# Patient Record
Sex: Female | Born: 1975 | Race: Black or African American | Hispanic: No | Smoking: Never smoker
Health system: Southern US, Community
[De-identification: ages and names within clinical notes are randomized; demographics above are authoritative.]

## PROBLEM LIST (undated history)

## (undated) DIAGNOSIS — D849 Immunodeficiency, unspecified: Secondary | ICD-10-CM

## (undated) DIAGNOSIS — K219 Gastro-esophageal reflux disease without esophagitis: Secondary | ICD-10-CM

## (undated) DIAGNOSIS — H3560 Retinal hemorrhage, unspecified eye: Secondary | ICD-10-CM

## (undated) DIAGNOSIS — R7303 Prediabetes: Secondary | ICD-10-CM

## (undated) DIAGNOSIS — Z8709 Personal history of other diseases of the respiratory system: Secondary | ICD-10-CM

## (undated) DIAGNOSIS — E559 Vitamin D deficiency, unspecified: Secondary | ICD-10-CM

## (undated) DIAGNOSIS — M797 Fibromyalgia: Secondary | ICD-10-CM

## (undated) DIAGNOSIS — G43909 Migraine, unspecified, not intractable, without status migrainosus: Secondary | ICD-10-CM

## (undated) DIAGNOSIS — N3289 Other specified disorders of bladder: Secondary | ICD-10-CM

## (undated) DIAGNOSIS — H409 Unspecified glaucoma: Secondary | ICD-10-CM

## (undated) DIAGNOSIS — D259 Leiomyoma of uterus, unspecified: Secondary | ICD-10-CM

## (undated) DIAGNOSIS — M1990 Unspecified osteoarthritis, unspecified site: Secondary | ICD-10-CM

## (undated) DIAGNOSIS — D509 Iron deficiency anemia, unspecified: Secondary | ICD-10-CM

## (undated) DIAGNOSIS — D649 Anemia, unspecified: Secondary | ICD-10-CM

## (undated) DIAGNOSIS — J4520 Mild intermittent asthma, uncomplicated: Secondary | ICD-10-CM

## (undated) DIAGNOSIS — M351 Other overlap syndromes: Secondary | ICD-10-CM

## (undated) DIAGNOSIS — J449 Chronic obstructive pulmonary disease, unspecified: Secondary | ICD-10-CM

## (undated) DIAGNOSIS — F419 Anxiety disorder, unspecified: Secondary | ICD-10-CM

## (undated) DIAGNOSIS — M359 Systemic involvement of connective tissue, unspecified: Secondary | ICD-10-CM

## (undated) DIAGNOSIS — Z973 Presence of spectacles and contact lenses: Secondary | ICD-10-CM

## (undated) DIAGNOSIS — R3915 Urgency of urination: Secondary | ICD-10-CM

## (undated) DIAGNOSIS — R87610 Atypical squamous cells of undetermined significance on cytologic smear of cervix (ASC-US): Secondary | ICD-10-CM

## (undated) DIAGNOSIS — Z7901 Long term (current) use of anticoagulants: Secondary | ICD-10-CM

## (undated) DIAGNOSIS — H4010X0 Unspecified open-angle glaucoma, stage unspecified: Secondary | ICD-10-CM

## (undated) DIAGNOSIS — Z86711 Personal history of pulmonary embolism: Secondary | ICD-10-CM

## (undated) DIAGNOSIS — G4730 Sleep apnea, unspecified: Secondary | ICD-10-CM

## (undated) DIAGNOSIS — B029 Zoster without complications: Secondary | ICD-10-CM

## (undated) DIAGNOSIS — Z862 Personal history of diseases of the blood and blood-forming organs and certain disorders involving the immune mechanism: Secondary | ICD-10-CM

## (undated) DIAGNOSIS — N920 Excessive and frequent menstruation with regular cycle: Secondary | ICD-10-CM

## (undated) DIAGNOSIS — Z6841 Body Mass Index (BMI) 40.0 and over, adult: Secondary | ICD-10-CM

## (undated) HISTORY — DX: Migraine, unspecified, not intractable, without status migrainosus: G43.909

## (undated) HISTORY — DX: Zoster without complications: B02.9

## (undated) HISTORY — DX: Body Mass Index (BMI) 40.0 and over, adult: Z684

## (undated) HISTORY — DX: Chronic obstructive pulmonary disease, unspecified: J44.9

## (undated) HISTORY — PX: TONSILLECTOMY: SUR1361

## (undated) HISTORY — DX: Anxiety disorder, unspecified: F41.9

## (undated) HISTORY — DX: Personal history of diseases of the blood and blood-forming organs and certain disorders involving the immune mechanism: Z86.2

## (undated) HISTORY — DX: Morbid (severe) obesity due to excess calories: E66.01

## (undated) HISTORY — PX: ANAL EXAMINATION UNDER ANESTHESIA: SHX1138

## (undated) HISTORY — DX: Unspecified osteoarthritis, unspecified site: M19.90

## (undated) HISTORY — PX: GYNECOLOGIC CRYOSURGERY: SHX857

---
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
## 1998-05-26 ENCOUNTER — Emergency Department (HOSPITAL_COMMUNITY): Admission: EM | Admit: 1998-05-26 | Discharge: 1998-05-26 | Payer: Self-pay | Admitting: Emergency Medicine

## 1998-05-26 ENCOUNTER — Encounter: Payer: Self-pay | Admitting: Emergency Medicine

## 1998-09-12 ENCOUNTER — Other Ambulatory Visit: Admission: RE | Admit: 1998-09-12 | Discharge: 1998-09-12 | Payer: Self-pay | Admitting: Obstetrics and Gynecology

## 1998-12-02 ENCOUNTER — Other Ambulatory Visit: Admission: RE | Admit: 1998-12-02 | Discharge: 1998-12-02 | Payer: Self-pay | Admitting: Obstetrics and Gynecology

## 1999-05-12 ENCOUNTER — Other Ambulatory Visit: Admission: RE | Admit: 1999-05-12 | Discharge: 1999-05-12 | Payer: Self-pay | Admitting: Obstetrics and Gynecology

## 1999-09-16 ENCOUNTER — Other Ambulatory Visit: Admission: RE | Admit: 1999-09-16 | Discharge: 1999-09-16 | Payer: Self-pay | Admitting: Obstetrics and Gynecology

## 2000-04-27 ENCOUNTER — Other Ambulatory Visit: Admission: RE | Admit: 2000-04-27 | Discharge: 2000-04-27 | Payer: Self-pay | Admitting: Obstetrics and Gynecology

## 2000-10-03 ENCOUNTER — Other Ambulatory Visit: Admission: RE | Admit: 2000-10-03 | Discharge: 2000-10-03 | Payer: Self-pay | Admitting: Obstetrics and Gynecology

## 2004-06-11 ENCOUNTER — Ambulatory Visit: Payer: Self-pay | Admitting: Internal Medicine

## 2004-09-21 ENCOUNTER — Ambulatory Visit: Payer: Self-pay | Admitting: Internal Medicine

## 2004-11-10 ENCOUNTER — Emergency Department (HOSPITAL_COMMUNITY): Admission: EM | Admit: 2004-11-10 | Discharge: 2004-11-10 | Payer: Self-pay | Admitting: Emergency Medicine

## 2005-02-04 ENCOUNTER — Ambulatory Visit: Payer: Self-pay | Admitting: Internal Medicine

## 2005-07-15 ENCOUNTER — Ambulatory Visit: Payer: Self-pay | Admitting: Internal Medicine

## 2005-08-05 ENCOUNTER — Ambulatory Visit: Payer: Self-pay | Admitting: Internal Medicine

## 2005-09-07 ENCOUNTER — Emergency Department (HOSPITAL_COMMUNITY): Admission: EM | Admit: 2005-09-07 | Discharge: 2005-09-07 | Payer: Self-pay | Admitting: Family Medicine

## 2006-03-23 ENCOUNTER — Encounter: Admission: RE | Admit: 2006-03-23 | Discharge: 2006-03-23 | Payer: Self-pay | Admitting: Internal Medicine

## 2010-03-04 ENCOUNTER — Encounter: Admission: RE | Admit: 2010-03-04 | Discharge: 2010-03-04 | Payer: Self-pay | Admitting: Internal Medicine

## 2013-04-23 ENCOUNTER — Other Ambulatory Visit: Payer: Self-pay

## 2013-04-23 DIAGNOSIS — Z1231 Encounter for screening mammogram for malignant neoplasm of breast: Secondary | ICD-10-CM

## 2013-04-27 ENCOUNTER — Ambulatory Visit (HOSPITAL_COMMUNITY)
Admission: RE | Admit: 2013-04-27 | Discharge: 2013-04-27 | Disposition: A | Discharge status: Home / Self Care | Payer: 59 | Source: Ambulatory Visit | Attending: Internal Medicine | Admitting: Internal Medicine

## 2013-04-27 DIAGNOSIS — Z1231 Encounter for screening mammogram for malignant neoplasm of breast: Secondary | ICD-10-CM | POA: Insufficient documentation

## 2016-11-02 ENCOUNTER — Inpatient Hospital Stay (HOSPITAL_COMMUNITY)
Admission: EM | Admit: 2016-11-02 | Discharge: 2016-11-04 | DRG: 176 | Disposition: A | Discharge status: Home / Self Care | Payer: 59 | Attending: Internal Medicine | Admitting: Internal Medicine

## 2016-11-02 ENCOUNTER — Emergency Department (HOSPITAL_COMMUNITY): Payer: 59

## 2016-11-02 ENCOUNTER — Encounter (HOSPITAL_COMMUNITY): Payer: Self-pay | Admitting: Emergency Medicine

## 2016-11-02 DIAGNOSIS — R079 Chest pain, unspecified: Secondary | ICD-10-CM | POA: Diagnosis not present

## 2016-11-02 DIAGNOSIS — Z6841 Body Mass Index (BMI) 40.0 and over, adult: Secondary | ICD-10-CM

## 2016-11-02 DIAGNOSIS — J45909 Unspecified asthma, uncomplicated: Secondary | ICD-10-CM | POA: Diagnosis present

## 2016-11-02 DIAGNOSIS — Z23 Encounter for immunization: Secondary | ICD-10-CM

## 2016-11-02 DIAGNOSIS — E66813 Obesity, class 3: Secondary | ICD-10-CM | POA: Diagnosis present

## 2016-11-02 DIAGNOSIS — I2699 Other pulmonary embolism without acute cor pulmonale: Principal | ICD-10-CM | POA: Diagnosis present

## 2016-11-02 LAB — CBC
HCT: 38.2 % (ref 36.0–46.0)
Hemoglobin: 12.2 g/dL (ref 12.0–15.0)
MCH: 26.9 pg (ref 26.0–34.0)
MCHC: 31.9 g/dL (ref 30.0–36.0)
MCV: 84.3 fL (ref 78.0–100.0)
PLATELETS: 278 10*3/uL (ref 150–400)
RBC: 4.53 MIL/uL (ref 3.87–5.11)
RDW: 14 % (ref 11.5–15.5)
WBC: 8.4 10*3/uL (ref 4.0–10.5)

## 2016-11-02 LAB — I-STAT TROPONIN, ED: TROPONIN I, POC: 0 ng/mL (ref 0.00–0.08)

## 2016-11-02 LAB — BASIC METABOLIC PANEL
Anion gap: 9 (ref 5–15)
BUN: 5 mg/dL — AB (ref 6–20)
CHLORIDE: 103 mmol/L (ref 101–111)
CO2: 24 mmol/L (ref 22–32)
CREATININE: 0.68 mg/dL (ref 0.44–1.00)
Calcium: 9.4 mg/dL (ref 8.9–10.3)
GFR calc non Af Amer: >60 mL/min (ref >60)
Glucose, Bld: 108 mg/dL — ABNORMAL HIGH (ref 65–99)
Potassium: 3.6 mmol/L (ref 3.5–5.1)
SODIUM: 136 mmol/L (ref 135–145)

## 2016-11-02 LAB — POC URINE PREG, ED: Preg Test, Ur: NEGATIVE

## 2016-11-02 MED ORDER — HYDROMORPHONE HCL 1 MG/ML IJ SOLN
1.0000 mg | Freq: Once | INTRAMUSCULAR | Status: AC
  Administered 2016-11-02: 1 mg via INTRAVENOUS
  Filled 2016-11-02: qty 1

## 2016-11-02 MED ORDER — IOPAMIDOL (ISOVUE-370) INJECTION 76%
INTRAVENOUS | Status: AC
  Administered 2016-11-03: 100 mL
  Filled 2016-11-02: qty 100

## 2016-11-02 MED ORDER — ONDANSETRON HCL 4 MG/2ML IJ SOLN
4.0000 mg | Freq: Four times a day (QID) | INTRAMUSCULAR | Status: DC | PRN
  Administered 2016-11-02: 4 mg via INTRAVENOUS
  Filled 2016-11-02: qty 2

## 2016-11-02 NOTE — ED Triage Notes (Signed)
Pt reports cp for several days, states pain with movement. Sent from UC with abnormal EKG showing PACs. Pt reports shortness of breath.

## 2016-11-03 ENCOUNTER — Ambulatory Visit (HOSPITAL_BASED_OUTPATIENT_CLINIC_OR_DEPARTMENT_OTHER): Payer: 59

## 2016-11-03 ENCOUNTER — Encounter (HOSPITAL_COMMUNITY): Payer: Self-pay | Admitting: Radiology

## 2016-11-03 ENCOUNTER — Emergency Department (HOSPITAL_COMMUNITY): Payer: 59

## 2016-11-03 DIAGNOSIS — I2699 Other pulmonary embolism without acute cor pulmonale: Secondary | ICD-10-CM | POA: Diagnosis not present

## 2016-11-03 DIAGNOSIS — J45909 Unspecified asthma, uncomplicated: Secondary | ICD-10-CM | POA: Diagnosis present

## 2016-11-03 DIAGNOSIS — Z86711 Personal history of pulmonary embolism: Secondary | ICD-10-CM | POA: Diagnosis not present

## 2016-11-03 DIAGNOSIS — Z6841 Body Mass Index (BMI) 40.0 and over, adult: Secondary | ICD-10-CM | POA: Diagnosis not present

## 2016-11-03 DIAGNOSIS — R079 Chest pain, unspecified: Secondary | ICD-10-CM | POA: Diagnosis present

## 2016-11-03 DIAGNOSIS — Z23 Encounter for immunization: Secondary | ICD-10-CM | POA: Diagnosis not present

## 2016-11-03 LAB — HEPARIN LEVEL (UNFRACTIONATED)
HEPARIN UNFRACTIONATED: 0.51 [IU]/mL (ref 0.30–0.70)
Heparin Unfractionated: 0.53 IU/mL (ref 0.30–0.70)

## 2016-11-03 LAB — HIV ANTIBODY (ROUTINE TESTING W REFLEX): HIV Screen 4th Generation wRfx: NONREACTIVE

## 2016-11-03 LAB — ANTITHROMBIN III: AntiThromb III Func: 103 % (ref 75–120)

## 2016-11-03 MED ORDER — POLYETHYLENE GLYCOL 3350 17 G PO PACK: 17.0000 g | PACK | Freq: Every day | ORAL | Status: DC | PRN

## 2016-11-03 MED ORDER — HEPARIN BOLUS VIA INFUSION
6000.0000 [IU] | Freq: Once | INTRAVENOUS | Status: AC
  Administered 2016-11-03: 6000 [IU] via INTRAVENOUS
  Filled 2016-11-03: qty 6000

## 2016-11-03 MED ORDER — OXYCODONE-ACETAMINOPHEN 5-325 MG PO TABS
1.0000 | ORAL_TABLET | Freq: Four times a day (QID) | ORAL | Status: DC | PRN
  Administered 2016-11-03 (×3): 1 via ORAL
  Administered 2016-11-03 – 2016-11-04 (×3): 2 via ORAL
  Filled 2016-11-03 (×3): qty 2
  Filled 2016-11-03 (×3): qty 1

## 2016-11-03 MED ORDER — HEPARIN (PORCINE) IN NACL 100-0.45 UNIT/ML-% IJ SOLN
1750.0000 [IU]/h | INTRAMUSCULAR | Status: AC
  Administered 2016-11-03: 1750 [IU]/h via INTRAVENOUS
  Filled 2016-11-03 (×2): qty 250

## 2016-11-03 MED ORDER — LORATADINE 10 MG PO TABS
10.0000 mg | ORAL_TABLET | Freq: Every day | ORAL | Status: DC
  Administered 2016-11-03 – 2016-11-04 (×2): 10 mg via ORAL
  Filled 2016-11-03 (×2): qty 1

## 2016-11-03 MED ORDER — ACETAMINOPHEN 650 MG RE SUPP
650.0000 mg | Freq: Four times a day (QID) | RECTAL | Status: DC | PRN
  Administered 2016-11-04: 650 mg via RECTAL

## 2016-11-03 MED ORDER — HYDROCODONE-ACETAMINOPHEN 5-325 MG PO TABS: 1.0000 | ORAL_TABLET | ORAL | Status: DC | PRN

## 2016-11-03 MED ORDER — APIXABAN 5 MG PO TABS
10.0000 mg | ORAL_TABLET | Freq: Two times a day (BID) | ORAL | Status: DC
  Administered 2016-11-04: 10 mg via ORAL
  Filled 2016-11-03 (×2): qty 2

## 2016-11-03 MED ORDER — OXYCODONE-ACETAMINOPHEN 5-325 MG PO TABS
2.0000 | ORAL_TABLET | Freq: Once | ORAL | Status: AC
  Administered 2016-11-03: 2 via ORAL
  Filled 2016-11-03: qty 2

## 2016-11-03 MED ORDER — SODIUM CHLORIDE 0.9% FLUSH: 3.0000 mL | Freq: Two times a day (BID) | INTRAVENOUS | Status: DC

## 2016-11-03 MED ORDER — PNEUMOCOCCAL VAC POLYVALENT 25 MCG/0.5ML IJ INJ
0.5000 mL | INJECTION | INTRAMUSCULAR | Status: AC
  Administered 2016-11-04: 0.5 mL via INTRAMUSCULAR
  Filled 2016-11-03: qty 0.5

## 2016-11-03 MED ORDER — ONDANSETRON HCL 4 MG/2ML IJ SOLN: 4.0000 mg | Freq: Four times a day (QID) | INTRAMUSCULAR | Status: DC | PRN

## 2016-11-03 MED ORDER — ACETAMINOPHEN 325 MG PO TABS
650.0000 mg | ORAL_TABLET | Freq: Four times a day (QID) | ORAL | Status: DC | PRN
  Administered 2016-11-03 (×2): 650 mg via ORAL
  Filled 2016-11-03 (×4): qty 2

## 2016-11-03 MED ORDER — APIXABAN 5 MG PO TABS: 5.0000 mg | ORAL_TABLET | Freq: Two times a day (BID) | ORAL | Status: DC

## 2016-11-03 MED ORDER — LORATADINE 10 MG PO TABS: 10.0000 mg | ORAL_TABLET | Freq: Every day | ORAL | Status: DC

## 2016-11-03 MED ORDER — ONDANSETRON HCL 4 MG PO TABS
4.0000 mg | ORAL_TABLET | Freq: Four times a day (QID) | ORAL | Status: DC | PRN
  Filled 2016-11-03: qty 1

## 2016-11-03 MED ORDER — FLUTICASONE PROPIONATE 50 MCG/ACT NA SUSP
2.0000 | Freq: Every day | NASAL | Status: DC
  Administered 2016-11-03 – 2016-11-04 (×2): 2 via NASAL
  Filled 2016-11-03: qty 16

## 2016-11-03 NOTE — Progress Notes (Signed)
RE: Benefit check Eliquis Received: Today  Message Contents  Mauricia Area A CMA        Eliquis 5mg  PO BID is covered.  Prior Josem Kaufmann is not needed.  There is a quantity limit of 2.5 tablets per day.  Copay for 90 day supply (mail order) is $190.  Copay for 30 day supply at retail pharmacy ([facility]) is $85.

## 2016-11-03 NOTE — ED Provider Notes (Signed)
[facility] DEPT Provider Note   CSN: [id number] Arrival date & time: 11/02/16  1948     History   Chief Complaint Chief Complaint  Patient presents with  . Chest Pain    HPI Holly Mcdonald is a 41 y.o. female.  HPI Patient ports increasing chest pain shortness breath over the past several days without fever or chills.  She was seen at urgent care and sent to the emergency department secondary to frequent PACs.  Denies significant palpitations.  Reports a history of asthma but states this feels different.  She reports severe pleuritic anterior left-sided chest pain.  Chest pain is also worse with palpation.  Symptoms are moderate to severe in severity.   Past Medical History:  Diagnosis Date  . Asthma     There are no active problems to display for this patient.   Past Surgical History:  Procedure Laterality Date  . RECTAL EXAMINATION UNDER ANESTHESIA W/ CYSTOSCOPY    . TONSILLECTOMY      OB History    No data available       Home Medications    Prior to Admission medications   Not on File    Family History No family history on file.  Social History Social History  Substance Use Topics  . Smoking status: Never Smoker  . Smokeless tobacco: Never Used  . Alcohol use Yes     Allergies   Patient has no active allergies.   Review of Systems Review of Systems  All other systems reviewed and are negative.    Physical Exam Updated Vital Signs BP 116/81   Pulse 94   Temp 99.5 F (37.5 C) (Oral)   Resp (!) 22   LMP 10/24/2016 (Within Days)   SpO2 100%   Physical Exam  Constitutional: She is oriented to person, place, and time. She appears well-developed and well-nourished. No distress.  HENT:  Head: Normocephalic and atraumatic.  Eyes: EOM are normal.  Neck: Normal range of motion.  Cardiovascular: Normal rate, regular rhythm and normal heart sounds.   Pulmonary/Chest: Effort normal and breath sounds normal.  Anterior chest  tenderness  Abdominal: Soft. She exhibits no distension. There is no tenderness.  Musculoskeletal: Normal range of motion. She exhibits no edema or tenderness.  Neurological: She is alert and oriented to person, place, and time.  Skin: Skin is warm and dry.  Psychiatric: She has a normal mood and affect. Judgment normal.  Nursing note and vitals reviewed.    ED Treatments / Results  Labs (all labs ordered are listed, but only abnormal results are displayed) Labs Reviewed  BASIC METABOLIC PANEL - Abnormal; Notable for the following:       Result Value   Glucose, Bld 108 (*)    BUN 5 (*)    All other components within normal limits  CBC  ANTITHROMBIN III  HIV ANTIBODY (ROUTINE TESTING)  PROTEIN C ACTIVITY  PROTEIN C, TOTAL  PROTEIN S ACTIVITY  PROTEIN S, TOTAL  LUPUS ANTICOAGULANT PANEL  BETA-2-GLYCOPROTEIN I ABS, IGG/M/A  HOMOCYSTEINE  FACTOR 5 LEIDEN  PROTHROMBIN GENE MUTATION  CARDIOLIPIN ANTIBODIES, IGG, IGM, IGA  HEPARIN LEVEL (UNFRACTIONATED)  I-STAT TROPOININ, ED  POC URINE PREG, ED    EKG  EKG Interpretation  Date/Time:  Tuesday November 02 2016 19:53:33 EDT Ventricular Rate:  94 PR Interval:  158 QRS Duration: 74 QT Interval:  326 QTC Calculation: 407 R Axis:   -47 Text Interpretation:  Normal sinus rhythm Left axis deviation Anterior infarct ,  age undetermined Abnormal ECG No significant change was found Confirmed by Jola Schmidt [phone]) on 11/02/2016 11:06:58 PM       Radiology Dg Chest 2 View  Result Date: 11/02/2016 CLINICAL DATA:  Left chest pain and shortness breath for 5 days. EXAM: CHEST  2 VIEW COMPARISON:  03/23/2006 chest radiograph FINDINGS: This is a low volume film. The cardiomediastinal silhouette is unremarkable. Left posterior lower lobe opacity may represent atelectasis or airspace disease. Mild peribronchial thickening again noted. There is no evidence of definite pleural effusion or pneumothorax. IMPRESSION: Left basilar opacity-question  atelectasis versus airspace disease/ pneumonia. Electronically Signed   By: Margarette Canada M.D.   On: 11/02/2016 21:04   Ct Angio Chest Pe W And/or Wo Contrast  Result Date: 11/03/2016 CLINICAL DATA:  41 y/o F; severe chest pain, question pneumonia, shortness of breath with exertion. EXAM: CT ANGIOGRAPHY CHEST WITH CONTRAST TECHNIQUE: Multidetector CT imaging of the chest was performed using the standard protocol during bolus administration of intravenous contrast. Multiplanar CT image reconstructions and MIPs were obtained to evaluate the vascular anatomy. CONTRAST:  80 cc Isovue 370 COMPARISON:  11/02/2016 chest radiograph. FINDINGS: Cardiovascular: Satisfactory opacification of the pulmonary arteries to the segmental level. Acute segmental pulmonary emboli are identified in the lower lobes bilaterally (series 7, image 131 and 137). RV/ LV ratio equals 0.8. Normal heart size.  No pericardial effusion. Mediastinum/Nodes: No enlarged mediastinal, hilar, or axillary lymph nodes. Thyroid gland, trachea, and esophagus demonstrate no significant findings. Lungs/Pleura: Small left pleural effusion and minor left lung base dependent atelectasis. No evidence for pneumonia. Upper Abdomen: No acute abnormality. Musculoskeletal: No chest wall abnormality. No acute or significant osseous findings. Review of the MIP images confirms the above findings. IMPRESSION: 1. Acute segmental pulmonary emboli in lower lobes bilaterally. No CT evidence of right heart strain. 2. Small left pleural effusion. 3. No pulmonary consolidation. These results were called by telephone at the time of interpretation on 11/03/2016 at 1:47 am to Dr. Jola Schmidt , who verbally acknowledged these results. Electronically Signed   By: Kristine Garbe M.D.   On: 11/03/2016 01:48    Procedures  +++++++++++++++++++++++++++++++++++++++++++++++  CRITICAL CARE Performed by: Hoy Morn Total critical care time: 32 minutes Critical care time  was exclusive of separately billable procedures and treating other patients. Critical care was necessary to treat or prevent imminent or life-threatening deterioration. Critical care was time spent personally by me on the following activities: development of treatment plan with patient and/or surrogate as well as nursing, discussions with consultants, evaluation of patient's response to treatment, examination of patient, obtaining history from patient or surrogate, ordering and performing treatments and interventions, ordering and review of laboratory studies, ordering and review of radiographic studies, pulse oximetry and re-evaluation of patient's condition.   +++++++++++++++++++++++++++++++++++++++++++++++++++++++  Procedures (including critical care time)  Medications Ordered in ED Medications  ondansetron (ZOFRAN) injection 4 mg (4 mg Intravenous Given 11/02/16 2339)  HYDROmorphone (DILAUDID) injection 1 mg (1 mg Intravenous Given 11/02/16 2337)  iopamidol (ISOVUE-370) 76 % injection (100 mLs  Contrast Given 11/03/16 0052)  oxyCODONE-acetaminophen (PERCOCET/ROXICET) 5-325 MG per tablet 2 tablet (2 tablets Oral Given 11/03/16 0121)     Initial Impression / Assessment and Plan / ED Course  I have reviewed the triage vital signs and the nursing notes.  Pertinent labs & imaging results that were available during my care of the patient were reviewed by me and considered in my medical decision making (see chart for details).  Patient started a heparin drip for acute subsegmental PEs bilaterally.  Also with left-sided pleural effusion which may be the cause of some of her pain and discomfort.  Patient started on heparin and will be admitted to the stepdown unit with the hospitalist service.  We will continue to follow the patient closely while in the emergency department.  No CT evidence of right heart strain  Final Clinical Impressions(s) / ED Diagnoses   Final diagnoses:  Other acute  pulmonary embolism without acute cor pulmonale (HCC)    New Prescriptions New Prescriptions   No medications on file     Jola Schmidt, MD 11/03/16 0500

## 2016-11-03 NOTE — ED Notes (Signed)
high risk meds verified by second RN Garlon Hatchet

## 2016-11-03 NOTE — Progress Notes (Signed)
**  Preliminary report by tech**  Bilateral lower extremity venous duplex completed. There is no evidence of deep or superficial vein thrombosis involving the right and left lower extremities. All visualized vessels appear patent and compressible. There is no evidence of Baker's cysts bilaterally.  11/03/16 1:06 PM Holly Mcdonald RVT

## 2016-11-03 NOTE — H&P (Addendum)
History and Physical    Holly Mcdonald DUK:[id number] DOB: Feb 24, 1976 DOA: 11/02/2016  PCP: Patient, No Pcp Per  Patient coming from: Home  I have personally briefly reviewed patient's old medical records in [place]  Chief Complaint: CP  HPI: Holly Mcdonald is a 41 y.o. female with medical history significant of Asthma.  Patient presents to the ED with c/o CP for past several days.  Pain with movement.  Associated SOB.  Recent travel to beach a couple of weeks ago.  Recent R leg pain as well (thought it was "muscle cramps").  Mother had PE last year.   ED Course: B segmental PE on CTA.   Review of Systems: As per HPI otherwise 10 point review of systems negative.   Past Medical History:  Diagnosis Date  . Asthma     Past Surgical History:  Procedure Laterality Date  . RECTAL EXAMINATION UNDER ANESTHESIA W/ CYSTOSCOPY    . TONSILLECTOMY       reports that she has never smoked. She has never used smokeless tobacco. She reports that she drinks alcohol. She reports that she does not use drugs.  No Known Allergies  Family History  Problem Relation Age of Onset  . Pulmonary embolism Mother        Last year     Prior to Admission medications   Medication Sig Start Date End Date Taking? Authorizing Provider  albuterol (PROVENTIL HFA;VENTOLIN HFA) 108 (90 Base) MCG/ACT inhaler Inhale 1-2 puffs into the lungs every 6 (six) hours as needed for wheezing or shortness of breath.   Yes [provider]  cetirizine (ZYRTEC) 10 MG tablet Take 10 mg by mouth daily.   Yes [provider]  timolol (TIMOPTIC) 0.5 % ophthalmic solution Place 1 drop into both eyes every morning.   Yes [provider]    Physical Exam: Vitals:   11/02/16 2345 11/03/16 0030 11/03/16 0130 11/03/16 0200  BP: 116/81 123/73 103/62   Pulse: 94 94 96   Resp:  (!) 27 (!) 33   Temp:      TempSrc:      SpO2: 100% 97% 98%   Weight:    (!) 161 kg (355 lb)  Height:     5\' 10"  (1.778 m)    Constitutional: NAD, calm, comfortable Eyes: PERRL, lids and conjunctivae normal ENMT: Mucous membranes are moist. Posterior pharynx clear of any exudate or lesions.Normal dentition.  Neck: normal, supple, no masses, no thyromegaly Respiratory: clear to auscultation bilaterally, no wheezing, no crackles. Normal respiratory effort. No accessory muscle use.  Cardiovascular: Regular rate and rhythm, no murmurs / rubs / gallops. No extremity edema. 2+ pedal pulses. No carotid bruits.  Abdomen: no tenderness, no masses palpated. No hepatosplenomegaly. Bowel sounds positive.  Musculoskeletal: no clubbing / cyanosis. No joint deformity upper and lower extremities. Good ROM, no contractures. Normal muscle tone.  Skin: no rashes, lesions, ulcers. No induration Neurologic: CN 2-12 grossly intact. Sensation intact, DTR normal. Strength 5/5 in all 4.  Psychiatric: Normal judgment and insight. Alert and oriented x 3. Normal mood.    Labs on Admission: I have personally reviewed following labs and imaging studies  CBC:  Recent Labs Lab 11/02/16 2013  WBC 8.4  HGB 12.2  HCT 38.2  MCV 84.3  PLT 376   Basic Metabolic Panel:  Recent Labs Lab 11/02/16 2013  NA 136  K 3.6  CL 103  CO2 24  GLUCOSE 108*  BUN 5*  CREATININE 0.68  CALCIUM 9.4   GFR: Estimated Creatinine Clearance: 155.7 mL/min (by C-G formula based on SCr of 0.68 mg/dL). Liver Function Tests: No results for input(s): AST, ALT, ALKPHOS, BILITOT, PROT, ALBUMIN in the last 168 hours. No results for input(s): LIPASE, AMYLASE in the last 168 hours. No results for input(s): AMMONIA in the last 168 hours. Coagulation Profile: No results for input(s): INR, PROTIME in the last 168 hours. Cardiac Enzymes: No results for input(s): CKTOTAL, CKMB, CKMBINDEX, TROPONINI in the last 168 hours. BNP (last 3 results) No results for input(s): PROBNP in the last 8760 hours. HbA1C: No results for input(s): HGBA1C in the  last 72 hours. CBG: No results for input(s): GLUCAP in the last 168 hours. Lipid Profile: No results for input(s): CHOL, HDL, LDLCALC, TRIG, CHOLHDL, LDLDIRECT in the last 72 hours. Thyroid Function Tests: No results for input(s): TSH, T4TOTAL, FREET4, T3FREE, THYROIDAB in the last 72 hours. Anemia Panel: No results for input(s): VITAMINB12, FOLATE, FERRITIN, TIBC, IRON, RETICCTPCT in the last 72 hours. Urine analysis: No results found for: COLORURINE, APPEARANCEUR, LABSPEC, PHURINE, GLUCOSEU, HGBUR, BILIRUBINUR, KETONESUR, PROTEINUR, UROBILINOGEN, NITRITE, LEUKOCYTESUR  Radiological Exams on Admission: Dg Chest 2 View  Result Date: 11/02/2016 CLINICAL DATA:  Left chest pain and shortness breath for 5 days. EXAM: CHEST  2 VIEW COMPARISON:  03/23/2006 chest radiograph FINDINGS: This is a low volume film. The cardiomediastinal silhouette is unremarkable. Left posterior lower lobe opacity may represent atelectasis or airspace disease. Mild peribronchial thickening again noted. There is no evidence of definite pleural effusion or pneumothorax. IMPRESSION: Left basilar opacity-question atelectasis versus airspace disease/ pneumonia. Electronically Signed   By: Margarette Canada M.D.   On: 11/02/2016 21:04   Ct Angio Chest Pe W And/or Wo Contrast  Result Date: 11/03/2016 CLINICAL DATA:  42 y/o F; severe chest pain, question pneumonia, shortness of breath with exertion. EXAM: CT ANGIOGRAPHY CHEST WITH CONTRAST TECHNIQUE: Multidetector CT imaging of the chest was performed using the standard protocol during bolus administration of intravenous contrast. Multiplanar CT image reconstructions and MIPs were obtained to evaluate the vascular anatomy. CONTRAST:  80 cc Isovue 370 COMPARISON:  11/02/2016 chest radiograph. FINDINGS: Cardiovascular: Satisfactory opacification of the pulmonary arteries to the segmental level. Acute segmental pulmonary emboli are identified in the lower lobes bilaterally (series 7, image  131 and 137). RV/ LV ratio equals 0.8. Normal heart size.  No pericardial effusion. Mediastinum/Nodes: No enlarged mediastinal, hilar, or axillary lymph nodes. Thyroid gland, trachea, and esophagus demonstrate no significant findings. Lungs/Pleura: Small left pleural effusion and minor left lung base dependent atelectasis. No evidence for pneumonia. Upper Abdomen: No acute abnormality. Musculoskeletal: No chest wall abnormality. No acute or significant osseous findings. Review of the MIP images confirms the above findings. IMPRESSION: 1. Acute segmental pulmonary emboli in lower lobes bilaterally. No CT evidence of right heart strain. 2. Small left pleural effusion. 3. No pulmonary consolidation. These results were called by telephone at the time of interpretation on 11/03/2016 at 1:47 am to Dr. Jola Schmidt , who verbally acknowledged these results. Electronically Signed   By: Kristine Garbe M.D.   On: 11/03/2016 01:48    EKG: Independently reviewed.  Assessment/Plan Active Problems:   Bilateral pulmonary embolism (HCC)    1. B PE - 1. Tylenol or percocet for pain 2. Heparin gtt 3. Bridge to PO med (patient looking up her copays by her insurance) 4. hypercoag pnl ordered and pending 5. 2d echo to r/o heart strain though none seen on CT 6. BLE venous  duplex to r/o DVT  DVT prophylaxis: heparin gtt Code Status: Full Family Communication: Mother at bedside Disposition Plan: Home after admit Consults called: None Admission status: Place in obs   Laira Penninger, [facility] Pager [phone]  If 7AM-7PM, please contact day team taking care of patient [email or web address] Password [facility]  11/03/2016, 2:47 AM

## 2016-11-03 NOTE — Progress Notes (Signed)
ANTICOAGULATION CONSULT NOTE   Pharmacy Consult for Heparin Indication: pulmonary embolus  No Known Allergies  Patient Measurements: Height: 5\' 10"  (177.8 cm) Weight: (!) 352 lb 8 oz (159.9 kg) IBW/kg (Calculated) : 68.5 Heparin Dosing Weight: 110 kg  Vital Signs: Temp: 98.3 F (36.8 C) (06/27 1543) Temp Source: Oral (06/27 1543) BP: 118/70 (06/27 1543) Pulse Rate: 78 (06/27 1543)  Labs:  Recent Labs  11/02/16 2013 11/03/16 1040 11/03/16 1634  HGB 12.2  --   --   HCT 38.2  --   --   PLT 278  --   --   HEPARINUNFRC  --  0.53 0.51  CREATININE 0.68  --   --     Estimated Creatinine Clearance: 155.1 mL/min (by C-G formula based on SCr of 0.68 mg/dL).  Assessment: 41 y.o. female with new PE on IV heparin. LE doppler negative for DVT.  Heparin level therapeutic x2 on infusion at 1750 units/hr.   Plan to transition to Eliquis tomorrow.   Goal of Therapy:  Heparin level 0.3-0.7 units/ml Monitor platelets by anticoagulation protocol: Yes   Plan:  Continue heparin 1750 units/hr unitl first dose of Eliquis is given on 6/28 (stop date entered) Eliquis 10 mg BID x 7 days first dose 6/28 at 1000, then switch to Eliquis to 5mg  BID on 7/5   Mehlani Blankenburg D. Wilbon Obenchain, PharmD, BCPS Clinical Pharmacist [phone] 11/03/2016 6:11 PM

## 2016-11-03 NOTE — Progress Notes (Signed)
Called the ED RN to ff up this pt coming to [place]. RN Santiago Glad says she is going to call me back, she is tied up with another pt now.

## 2016-11-03 NOTE — Progress Notes (Signed)
ANTICOAGULATION CONSULT NOTE  Pharmacy Consult for Heparin Indication: pulmonary embolus  Allergies no active allergies  Patient Measurements: Height: 5\' 10"  (177.8 cm) Weight: (!) 355 lb (161 kg) IBW/kg (Calculated) : 68.5 Heparin Dosing Weight: 110 kg  Vital Signs: Temp: 99.5 F (37.5 C) (06/26 1957) Temp Source: Oral (06/26 1957) BP: 103/62 (06/27 0130) Pulse Rate: 96 (06/27 0130)  Labs:  Recent Labs  11/02/16 2013  HGB 12.2  HCT 38.2  PLT 278  CREATININE 0.68    Estimated Creatinine Clearance: 155.7 mL/min (by C-G formula based on SCr of 0.68 mg/dL).   Medical History: Past Medical History:  Diagnosis Date  . Asthma     Medications:  No current facility-administered medications on file prior to encounter.    No current outpatient prescriptions on file prior to encounter.     Assessment: 41 y.o. female with PE for heparin  Goal of Therapy:  Heparin level 0.3-0.7 units/ml Monitor platelets by anticoagulation protocol: Yes   Plan:  Heparin 6000 units IV bolus, then start heparin 1750 units/hr Check heparin level in 6 hours.   Devaun Hernandez, [facility] 11/03/2016,2:25 AM

## 2016-11-03 NOTE — Progress Notes (Signed)
ANTICOAGULATION CONSULT NOTE  Pharmacy Consult for Heparin Indication: pulmonary embolus  No Known Allergies  Patient Measurements: Height: 5\' 10"  (177.8 cm) Weight: (!) 352 lb 8 oz (159.9 kg) IBW/kg (Calculated) : 68.5 Heparin Dosing Weight: 110 kg  Vital Signs: Temp: 97.8 F (36.6 C) (06/27 0757) Temp Source: Oral (06/27 0757) BP: 100/67 (06/27 0757) Pulse Rate: 70 (06/27 0757)  Labs:  Recent Labs  11/02/16 2013 11/03/16 1040  HGB 12.2  --   HCT 38.2  --   PLT 278  --   HEPARINUNFRC  --  0.53  CREATININE 0.68  --     Estimated Creatinine Clearance: 155.1 mL/min (by C-G formula based on SCr of 0.68 mg/dL).   Medical History: Past Medical History:  Diagnosis Date  . Asthma     Medications:  No current facility-administered medications on file prior to encounter.    No current outpatient prescriptions on file prior to encounter.     Assessment: 41 y.o. female with new PE on IV heparin. LE doppler negative for DVT, first heparin level = 0.53,  therapeutic on 1750 units/hr. Plan to continue heparin today, and transition to eliquis tomorrow morning. Scr 0.68.   Goal of Therapy:  Heparin level 0.3-0.7 units/ml Monitor platelets by anticoagulation protocol: Yes   Plan:  Continue heparin 1750 units/hr Re-Check confirmatory heparin level at 1700 Eliquis 10 mg BID x 7 days first dose tomorrow at 1000 Switch Eliquis to 5mg  BID on 7/5 D/c IV heparin tomorrow morning when first dose eliquis is given  Maryanna Shape, PharmD, BCPS  Clinical Pharmacist  Pager: [phone]    11/03/2016,1:19 PM

## 2016-11-03 NOTE — Progress Notes (Signed)
Tech offered Pt a bath. Pt stated she will do bath later. Tech gave Pt all bath supplies.

## 2016-11-03 NOTE — Progress Notes (Signed)
Tech rechecked with Pt regarding bath. Pt stated that she has already washed up.

## 2016-11-03 NOTE — Progress Notes (Signed)
PROGRESS NOTE                                                                                                                                                                                                             Patient Demographics:    Holly Mcdonald, is a 41 y.o. female, DOB - 1975-10-04, BSW:[id number]  Admit date - 11/02/2016   Admitting Physician Etta Quill, DO  Outpatient Primary MD for the patient is Patient, No Pcp Per  LOS - 0  Chief Complaint  Patient presents with  . Chest Pain       Brief Narrative  This is a no charge note, patient admitted this a.m. by Dr. Alcario Drought, charts, note and imaging were reviewed.  41 year old female presents with complaints of pleuritic chest pain, dyspnea, workup significant for bilateral PE, he endorsed recent 4 hour driving trip to the beach.   Subjective:    Holly Mcdonald today has, No headache,Reports her dyspnea has improved, but reports some pleuritic chest pain on deep breathing, denies any hemoptysis, reports nasal congestion secondary to allergy, asking for  her Claritin.    Assessment  & Plan :    Active Problems:   Bilateral pulmonary embolism (HCC)  Acute bilateral PE - Seems to be provoked secondary to long distance driving to the beach recently, but given history of mother with  PE, hypercoagulable workup was obtained. - Continue with heparin GTT today, hopefully can be transitioned to oral Eliquis in a.m., consult has been placed for pharmacy and his management. - Follow on 2-D echo to rule out right heart strain - Follow on bilateral venous duplex to rule out DVT(patient has chronic swelling).    Code Status : Full  Family Communication  : None at bedside  Disposition Plan  : when when When stable, hopefully tomorrow when transitioned to oral anticoagulation  Consults  :  None  Procedures  : None  DVT Prophylaxis  :   Heparin GTT  Lab Results  Component Value Date     PLT 278 11/02/2016    Antibiotics  :  Anti-infectives    None        Objective:   Vitals:   11/03/16 0230 11/03/16 0400 11/03/16 0447 11/03/16 0757  BP: 110/75 124/72 131/77 100/67  Pulse: 97 96 (!) 105 70  Resp: (!)  25  18 20   Temp:   98 F (36.7 C) 97.8 F (36.6 C)  TempSrc:   Oral Oral  SpO2: 97% 97% 100% 93%  Weight:   (!) 159.9 kg (352 lb 8 oz)   Height:   5\' 10"  (1.778 m)     Wt Readings from Last 3 Encounters:  11/03/16 (!) 159.9 kg (352 lb 8 oz)     Intake/Output Summary (Last 24 hours) at 11/03/16 1020 Last data filed at 11/03/16 0914  Gross per 24 hour  Intake            497.5 ml  Output                0 ml  Net            497.5 ml     Physical Exam  Awake Alert, Oriented X 3 Symmetrical Chest wall movement, Good air movement bilaterally, CTAB RRR,No Gallops,Rubs or new Murmurs, No Parasternal Heave +ve B.Sounds, Abd Soft, No tenderness, , No rebound - guarding or rigidity. No Cyanosis, Clubbing or edema, No new Rash or bruise      Data Review:    CBC  Recent Labs Lab 11/02/16 2013  WBC 8.4  HGB 12.2  HCT 38.2  PLT 278  MCV 84.3  MCH 26.9  MCHC 31.9  RDW 14.0    Chemistries   Recent Labs Lab 11/02/16 2013  NA 136  K 3.6  CL 103  CO2 24  GLUCOSE 108*  BUN 5*  CREATININE 0.68  CALCIUM 9.4   ------------------------------------------------------------------------------------------------------------------ No results for input(s): CHOL, HDL, LDLCALC, TRIG, CHOLHDL, LDLDIRECT in the last 72 hours.  No results found for: HGBA1C ------------------------------------------------------------------------------------------------------------------ No results for input(s): TSH, T4TOTAL, T3FREE, THYROIDAB in the last 72 hours.  Invalid input(s): FREET3 ------------------------------------------------------------------------------------------------------------------ No results for input(s): VITAMINB12, FOLATE, FERRITIN, TIBC, IRON,  RETICCTPCT in the last 72 hours.  Coagulation profile No results for input(s): INR, PROTIME in the last 168 hours.  No results for input(s): DDIMER in the last 72 hours.  Cardiac Enzymes No results for input(s): CKMB, TROPONINI, MYOGLOBIN in the last 168 hours.  Invalid input(s): CK ------------------------------------------------------------------------------------------------------------------ No results found for: BNP  Inpatient Medications  Scheduled Meds: . loratadine  10 mg Oral Daily  . [START ON 11/04/2016] pneumococcal 23 valent vaccine  0.5 mL Intramuscular Tomorrow-1000  . sodium chloride flush  3 mL Intravenous Q12H   Continuous Infusions: . heparin 1,750 Units/hr (11/03/16 0402)   PRN Meds:.acetaminophen **OR** acetaminophen, ondansetron **OR** ondansetron (ZOFRAN) IV, oxyCODONE-acetaminophen, polyethylene glycol  Micro Results No results found for this or any previous visit (from the past 240 hour(s)).  Radiology Reports Dg Chest 2 View  Result Date: 11/02/2016 CLINICAL DATA:  Left chest pain and shortness breath for 5 days. EXAM: CHEST  2 VIEW COMPARISON:  03/23/2006 chest radiograph FINDINGS: This is a low volume film. The cardiomediastinal silhouette is unremarkable. Left posterior lower lobe opacity may represent atelectasis or airspace disease. Mild peribronchial thickening again noted. There is no evidence of definite pleural effusion or pneumothorax. IMPRESSION: Left basilar opacity-question atelectasis versus airspace disease/ pneumonia. Electronically Signed   By: Margarette Canada M.D.   On: 11/02/2016 21:04   Ct Angio Chest Pe W And/or Wo Contrast  Result Date: 11/03/2016 CLINICAL DATA:  41 y/o F; severe chest pain, question pneumonia, shortness of breath with exertion. EXAM: CT ANGIOGRAPHY CHEST WITH CONTRAST TECHNIQUE: Multidetector CT imaging of the chest was performed using the standard protocol during bolus administration  of intravenous contrast. Multiplanar  CT image reconstructions and MIPs were obtained to evaluate the vascular anatomy. CONTRAST:  80 cc Isovue 370 COMPARISON:  11/02/2016 chest radiograph. FINDINGS: Cardiovascular: Satisfactory opacification of the pulmonary arteries to the segmental level. Acute segmental pulmonary emboli are identified in the lower lobes bilaterally (series 7, image 131 and 137). RV/ LV ratio equals 0.8. Normal heart size.  No pericardial effusion. Mediastinum/Nodes: No enlarged mediastinal, hilar, or axillary lymph nodes. Thyroid gland, trachea, and esophagus demonstrate no significant findings. Lungs/Pleura: Small left pleural effusion and minor left lung base dependent atelectasis. No evidence for pneumonia. Upper Abdomen: No acute abnormality. Musculoskeletal: No chest wall abnormality. No acute or significant osseous findings. Review of the MIP images confirms the above findings. IMPRESSION: 1. Acute segmental pulmonary emboli in lower lobes bilaterally. No CT evidence of right heart strain. 2. Small left pleural effusion. 3. No pulmonary consolidation. These results were called by telephone at the time of interpretation on 11/03/2016 at 1:47 am to Dr. Jola Schmidt , who verbally acknowledged these results. Electronically Signed   By: Kristine Garbe M.D.   On: 11/03/2016 01:48    Time Spent in minutes  No charge   [facility], Burleigh Brockmann M.D on 11/03/2016 at 10:20 AM  Between 7am to 7pm - Pager - [phone]  After 7pm go to [email or web address] - password [facility]  [facility] -  Office  [phone]

## 2016-11-04 ENCOUNTER — Other Ambulatory Visit (HOSPITAL_COMMUNITY): Payer: 59

## 2016-11-04 ENCOUNTER — Inpatient Hospital Stay (HOSPITAL_COMMUNITY): Payer: 59

## 2016-11-04 ENCOUNTER — Telehealth: Payer: Self-pay | Admitting: General Practice

## 2016-11-04 DIAGNOSIS — I2699 Other pulmonary embolism without acute cor pulmonale: Secondary | ICD-10-CM

## 2016-11-04 LAB — CBC
HEMATOCRIT: 34.5 % — AB (ref 36.0–46.0)
HEMOGLOBIN: 10.7 g/dL — AB (ref 12.0–15.0)
MCH: 26.3 pg (ref 26.0–34.0)
MCHC: 31 g/dL (ref 30.0–36.0)
MCV: 84.8 fL (ref 78.0–100.0)
Platelets: 227 10*3/uL (ref 150–400)
RBC: 4.07 MIL/uL (ref 3.87–5.11)
RDW: 14 % (ref 11.5–15.5)
WBC: 7.5 10*3/uL (ref 4.0–10.5)

## 2016-11-04 LAB — ECHOCARDIOGRAM COMPLETE
Height: 70 in
Weight: 5628.8 oz

## 2016-11-04 LAB — PROTEIN S, TOTAL: PROTEIN S AG TOTAL: 82 % (ref 60–150)

## 2016-11-04 LAB — PROTEIN C ACTIVITY: PROTEIN C ACTIVITY: 97 % (ref 73–180)

## 2016-11-04 LAB — CARDIOLIPIN ANTIBODIES, IGG, IGM, IGA
Anticardiolipin IgA: <9 APL U/mL (ref 0–11)
Anticardiolipin IgG: <9 GPL U/mL (ref 0–14)
Anticardiolipin IgM: <9 MPL U/mL (ref 0–12)

## 2016-11-04 LAB — HEPARIN LEVEL (UNFRACTIONATED): Heparin Unfractionated: 0.45 IU/mL (ref 0.30–0.70)

## 2016-11-04 LAB — HOMOCYSTEINE: Homocysteine: 12.5 umol/L (ref 0.0–15.0)

## 2016-11-04 LAB — PROTEIN S ACTIVITY: PROTEIN S ACTIVITY: 90 % (ref 63–140)

## 2016-11-04 MED ORDER — APIXABAN 5 MG PO TABS: 5.0000 mg | ORAL_TABLET | Freq: Two times a day (BID) | ORAL | 0 refills | Status: DC

## 2016-11-04 MED ORDER — OXYCODONE HCL 5 MG PO TABS: 5.0000 mg | ORAL_TABLET | ORAL | 0 refills | Status: DC | PRN

## 2016-11-04 MED ORDER — PERFLUTREN LIPID MICROSPHERE
1.0000 mL | INTRAVENOUS | Status: AC | PRN
  Administered 2016-11-04: 2 mL via INTRAVENOUS
  Filled 2016-11-04: qty 10

## 2016-11-04 NOTE — Care Management Note (Signed)
Case Management Note  Patient Details  Name: Mckinzey Entwistle MRN: [id number] Date of Birth: June 17, 1975  Subjective/Objective:  Admitted with Bilateral PE                Action/Plan: Patient is independent of all of her ADL's works at [employer]. No PCP, patient was agreeable for CM to assist her with finding a new PCP; Patient chose Dr [name] with [facility]. Apt made for July 18,2018 at 11:30 am; Eliquis coupon card given to patient with explanation of usage. She is aware of her co pay for Eliquis.  Expected Discharge Date:    11/04/2016              Expected Discharge Plan:  Home/Self Care    Discharge planning Services  CM Consult, Follow-up appt scheduled     Status of Service:  In process, will continue to follow  Sherrilyn Rist [phone] 11/04/2016, 11:02 AM

## 2016-11-04 NOTE — Plan of Care (Signed)
Problem: Pain Managment: Goal: General experience of comfort will improve Outcome: Progressing Pain assessed. Patient reports she has been more comfortable today because of the pain medication.  Patient experiences pain with deep inspiration however states it is not as bas as it has been.

## 2016-11-04 NOTE — Telephone Encounter (Signed)
error 

## 2016-11-04 NOTE — Progress Notes (Signed)
Discharge instructions reviewed with patient, vital signs are stable, echo completed, patient set up with primary care physician to follow up with in July, patient with no shortness of breath or chest pain, note for work given, as well as prescriptions, questions and concerns regarding discharged addressed, patient instructed on how to properly take her Eliquis as prescribed Arsenio Loader 6:55 PM 11-04-2016

## 2016-11-04 NOTE — Discharge Instructions (Addendum)
Information on my medicine - ELIQUIS (apixaban)  This medication education was reviewed with me or my healthcare representative as part of my discharge preparation.   Why was Eliquis prescribed for you? Eliquis was prescribed to treat blood clots that may have been found in the veins of your legs (deep vein thrombosis) or in your lungs (pulmonary embolism) and to reduce the risk of them occurring again.  What do You need to know about Eliquis ? The starting dose is 10 mg (two 5 mg tablets) taken TWICE daily for the FIRST SEVEN (7) DAYS, then on 11/11/2016 the dose is reduced to ONE 5 mg tablet taken TWICE daily.  Eliquis may be taken with or without food.   Try to take the dose about the same time in the morning and in the evening. If you have difficulty swallowing the tablet whole please discuss with your pharmacist how to take the medication safely.  Take Eliquis exactly as prescribed and DO NOT stop taking Eliquis without talking to the doctor who prescribed the medication.  Stopping may increase your risk of developing a new blood clot.  Refill your prescription before you run out.  After discharge, you should have regular check-up appointments with your healthcare provider that is prescribing your Eliquis.    What do you do if you miss a dose? If a dose of ELIQUIS is not taken at the scheduled time, take it as soon as possible on the same day and twice-daily administration should be resumed. The dose should not be doubled to make up for a missed dose.  Important Safety Information A possible side effect of Eliquis is bleeding. You should call your healthcare provider right away if you experience any of the following: ? Bleeding from an injury or your nose that does not stop. ? Unusual colored urine (red or dark brown) or unusual colored stools (red or black). ? Unusual bruising for unknown reasons. ? A serious fall or if you hit your head (even if there is no bleeding).  Some  medicines may interact with Eliquis and might increase your risk of bleeding or clotting while on Eliquis. To help avoid this, consult your healthcare provider or pharmacist prior to using any new prescription or non-prescription medications, including herbals, vitamins, non-steroidal anti-inflammatory drugs (NSAIDs) and supplements.  This website has more information on Eliquis (apixaban): http://www.eliquis.com/eliquis/home     Pulmonary Embolism A pulmonary embolism (PE) is a sudden blockage or decrease of blood flow in one lung or both lungs. Most blockages come from a blood clot that travels from the legs or the pelvis to the lungs. PE is a dangerous and potentially life-threatening condition if it is not treated right away. What are the causes? A pulmonary embolism occurs most commonly when a blood clot travels from one of your veins to your lungs. Rarely, PE is caused by air, fat, amniotic fluid, or part of a tumor traveling through your veins to your lungs. What increases the risk? A PE is more likely to develop in:  People who smoke.  People who areolder, especially over 17 years of age.  People who are overweight (obese).  People who sit or lie still for a long time, such as during long-distance travel (over 4 hours), bed rest, hospitalization, or during recovery from certain medical conditions like a stroke.  People who do not engage in much physical activity (sedentary lifestyle).  People who have chronic breathing disorders.  People whohave a personal or family history of blood  clots or blood clotting disease.  People whohave peripheral vascular disease (PVD), diabetes, or some types of cancer.  People who haveheart disease, especially if the person had a recent heart attack or has congestive heart failure.  People who have neurological diseases that affect the legs (leg paresis).  People who have had a traumatic injury, such as breaking a hip or leg.  People  whohave recently had major or lengthy surgery, especially on the hip, knee, or abdomen.  People who have hada central line placed inside a large vein.  People who takemedicines that contain the hormone estrogen. These include birth control pills and hormone replacement therapy.  Pregnancy or during childbirth or the postpartum period.  What are the signs or symptoms? The symptoms of a PE usually start suddenly and include:  Shortness of breath while active or at rest.  Coughing or coughing up blood or blood-tinged mucus.  Chest pain that is often worse with deep breaths.  Rapid or irregular heartbeat.  Feeling light-headed or dizzy.  Fainting.  Feelinganxious.  Sweating.  There may also be pain and swelling in a leg if that is where the blood clot started. These symptoms may represent a serious problem that is an emergency. Do not wait to see if the symptoms will go away. Get medical help right away. Call your local emergency services (911 in the [place] Do not drive yourself to the hospital. How is this diagnosed? Your health care provider will take a medical history and perform a physical exam. You may also have other tests, including:  Blood tests to assess the clotting properties of your blood, assess oxygen levels in your blood, and find blood clots.  Imaging tests, such as CT, ultrasound, MRI, X-ray, and other tests to see if you have clots anywhere in your body.  An electrocardiogram (ECG) to look for heart strain from blood clots in the lungs.  How is this treated? The main goals of PE treatment are:  To stop a blood clot from growing larger.  To stop new blood clots from forming.  The type of treatment that you receive depends on many factors, such as the cause of your PE, your risk for bleeding or developing more clots, and other medical conditions that you have. Sometimes, a combination of treatments is necessary. This condition may be treated  with:  Medicines, including newer oral blood thinners (anticoagulants), warfarin, low molecular weight heparins, thrombolytics, or heparins.  Wearing compression stockings or using different types of devices.  Surgery (rare) to remove the blood clot or to place a filter in your abdomen to stop the blood clot from traveling to your lungs.  Treatments for a PE are often divided into immediate treatment, long-term treatment (up to 3 months after PE), and extended treatment (more than 3 months after PE). Your treatment may continue for several months. This is called maintenance therapy, and it is used to prevent the forming of new blood clots. You can work with your health care provider to choose the treatment program that is best for you. What are anticoagulants? Anticoagulants are medicines that treat PEs. They can stop current blood clots from growing and stop new clots from forming. They cannot dissolve existing clots. Your body dissolves clots by itself over time. Anticoagulants are given by mouth, by injection, or through an IV tube. What are thrombolytics? Thrombolytics are clot-dissolving medicines that are used to dissolve a PE. They carry a high risk of bleeding, so they tend to be used  only in severe cases or if you have very low blood pressure. Follow these instructions at home: If you are taking a newer oral anticoagulant:  Take the medicine every single day at the same time each day.  Understand what foods and drugs interact with this medicine.  Understand that there are no regular blood tests required when using this medicine.  Understandthe side effects of this medicine, including excessive bruising or bleeding. Ask your health care provider or pharmacist about other possible side effects. If you are taking warfarin:  Understand how to take warfarin and know which foods can affect how warfarin works in Veterinary surgeon.  Understand that it is dangerous to taketoo much or too little  warfarin. Too much warfarin increases the risk of bleeding. Too little warfarin continues to allow the risk for blood clots.  Follow your PT and INR blood testing schedule. The PT and INR results allow your health care provider to adjust your dose of warfarin. It is very important that you have your PT and INR tested as often as told by your health care provider.  Avoid major changes in your diet, or tell your health care provider before you change your diet. Arrange a visit with a registered dietitian to answer your questions. Many foods, especially foods that are high in vitamin K, can interfere with warfarin and affect the PT and INR results. Eat a consistent amount of foods that are high in vitamin K, such as: ? Spinach, kale, broccoli, cabbage, collard greens, turnip greens, Brussels sprouts, peas, cauliflower, seaweed, and parsley. ? Beef liver and pork liver. ? Green tea. ? Soybean oil.  Tell your health care provider about any and all medicines, vitamins, and supplements that you take, including aspirin and other over-the-counter anti-inflammatory medicines. Be especially cautious with aspirin and anti-inflammatory medicines. Do not take those before you ask your health care provider if it is safe to do so. This is important because many medicines can interfere with warfarin and affect the PT and INR results.  Do not start or stop taking any over-the-counter or prescription medicine unless your health care provider or pharmacist tells you to do so. If you take warfarin, you will also need to do these things:  Hold pressure over cuts for longer than usual.  Tell your dentist and other health care providers that you are taking warfarin before you have any procedures in which bleeding may occur.  Avoid alcohol or drink very small amounts. Tell your health care provider if you change your alcohol intake.  Do not use tobacco products, including cigarettes, chewing tobacco, and e-cigarettes.  If you need help quitting, ask your health care provider.  Avoid contact sports.  General instructions  Take over-the-counter and prescription medicines only as told by your health care provider. Anticoagulant medicines can have side effects, including easy bruising and difficulty stopping bleeding. If you are prescribed an anticoagulant, you will also need to do these things: ? Hold pressure over cuts for longer than usual. ? Tell your dentist and other health care providers that you are taking anticoagulants before you have any procedures in which bleeding may occur. ? Avoid contact sports.  Wear a medical alert bracelet or carry a medical alert card that says you have had a PE.  Ask your health care provider how soon you can go back to your normal activities. Stay active to prevent new blood clots from forming.  Make sure to exercise while traveling or when you have been sitting  or standing for a long period of time. It is very important to exercise. Exercise your legs by walking or by tightening and relaxing your leg muscles often. Take frequent walks.  Wear compression stockings as told by your health care provider to help prevent more blood clots from forming.  Do not use tobacco products, including cigarettes, chewing tobacco, and e-cigarettes. If you need help quitting, ask your health care provider.  Keep all follow-up appointments with your health care provider. This is important. How is this prevented? Take these actions to decrease your risk of developing another PE:  Exercise regularly. For at least 30 minutes every day, engage in: ? Activity that involves moving your arms and legs. ? Activity that encourages good blood flow through your body by increasing your heart rate.  Exercise your arms and legs every hour during long-distance travel (over 4 hours). Drink plenty of water and avoid drinking alcohol while traveling.  Avoid sitting or lying in bed for long periods of  time without moving your legs.  Maintain a weight that is appropriate for your height. Ask your health care provider what weight is healthy for you.  If you are a woman who is over 8 years of age, avoid unnecessary use of medicines that contain estrogen. These include birth control pills.  Do not smoke, especially if you take estrogen medicines. If you need help quitting, ask your health care provider.  If you are at very high risk for PE, wear compression stockings.  If you recently had a PE, have regularly scheduled ultrasound testing on your legs to check for new blood clots.  If you are hospitalized, prevention measures may include:  Early walking after surgery, as soon as your health care provider says that it is safe.  Receiving anticoagulants to prevent blood clots. If you cannot take anticoagulants, other options may be available, such as wearing compression stockings or using different types of devices.  Get help right away if:  You have new or increased pain, swelling, or redness in an arm or leg.  You have numbness or tingling in an arm or leg.  You have shortness of breath while active or at rest.  You have chest pain.  You have a rapid or irregular heartbeat.  You feel light-headed or dizzy.  You cough up blood.  You notice blood in your vomit, bowel movement, or urine.  You have a fever. These symptoms may represent a serious problem that is an emergency. Do not wait to see if the symptoms will go away. Get medical help right away. Call your local emergency services (911 in the [place] Do not drive yourself to the hospital. This information is not intended to replace advice given to you by your health care provider. Make sure you discuss any questions you have with your health care provider. Document Released: 04/23/2000 Document Revised: 10/02/2015 Document Reviewed: 08/21/2014 Elsevier Interactive Patient Education  2017 [employer].

## 2016-11-04 NOTE — Discharge Summary (Addendum)
Physician Discharge Summary  Holly Mcdonald XBW:[id number] DOB: 1975/08/05 DOA: 11/02/2016  PCP: :Holly Mcdonald , MD (newly established) Admit date: 11/02/2016 Discharge date: 11/04/2016  Admitted From: Home Disposition:  Home  Recommendations for Outpatient Follow-up:  1. Follow up with New PCP on 7/18 at 11:30 AM. 2. Patient is started on Apixaban for bilateral pulmonary embolism. 3. Recommended duration of anticoagulation is at least 3 months if hypercoagulable workup negative. (Please follow workup results as outpatient).    Home Health: None Equipment/Devices: None  Discharge Condition: Stable CODE STATUS: Full code Diet recommendation: Regular    Discharge Diagnoses:  Active Problems:   Bilateral pulmonary embolism (HCC)   Obesity, Class III, BMI 40-49.9 (morbid obesity) (HCC)    Asthma   Brief narrative/history of present illness 41 year old morbidly obese female with history of asthma who presented to ED with chest pain for past several days, worsened with movement and associated shortness of breath. She traveled to the beach 3 weeks ago (about [address]). Also contain a recent right leg pain and thought it was muscle cramps. Her mother also had a PE last year. CT angiogram of the chest showed acute segmental pulmonary emboli in bilateral lower lobes, no CT evidence of right heart strain. Patient admitted to hospitalist service on IV heparin.  Hospital course Acute bilateral pulmonary embolism Possibly provoked by recent drive to the beach. Doppler lower extremity was negative for DVT. Hypercoagulable workup sent since her mother had PE 1 year ago. Follow-up as outpatient. Hemodynamically stable except for some shortness of breath on exertion and substernal chest discomfort. O2 sat normal on room air. Patient started on IV heparin. Anticoagulation options discussed and patient chose to be on Eliquis and has been transitioned. 2-D echo with normal LVEF and  no wall motion abnormality.   Morbid obesity Counseled on weight loss and excised. Case manager will assist in arranging outpatient sleep study.  Asthma Stable. Continue albuterol inhaler  Consults: None  Procedure: CT angiogram of the chest 2-D echo lower extremity Doppler  Family communication: None at bedside   Discharge Instructions   Allergies as of 11/04/2016   No Known Allergies     Medication List    TAKE these medications   albuterol 108 (90 Base) MCG/ACT inhaler Commonly known as:  PROVENTIL HFA;VENTOLIN HFA Inhale 1-2 puffs into the lungs every 6 (six) hours as needed for wheezing or shortness of breath.   apixaban 5 MG Tabs tablet Commonly known as:  ELIQUIS STARTER PACK Take 1 tablet (5 mg total) by mouth 2 (two) times daily. Take 2 tablets (10 mg ) 2 times a day for 7 days then 1 tablet (5mg  ) 2 times a day starting 11/11/2016   cetirizine 10 MG tablet Commonly known as:  ZYRTEC Take 10 mg by mouth daily.   oxyCODONE 5 MG immediate release tablet Commonly known as:  ROXICODONE Take 1 tablet (5 mg total) by mouth every 4 (four) hours as needed for severe pain.   timolol 0.5 % ophthalmic solution Commonly known as:  TIMOPTIC Place 1 drop into both eyes every morning.      Follow-up Information    Mcdonald, Holly G, MD Follow up on 11/24/2016.   Specialty:  Family Medicine Why:  at 11:30 am; please try to keep your apt or call to reschedule. Contact information: [place] [address] [phone]          No Known Allergies    Procedures/Studies: Dg  Chest 2 View  Result Date: 11/02/2016 CLINICAL DATA:  Left chest pain and shortness breath for 5 days. EXAM: CHEST  2 VIEW COMPARISON:  03/23/2006 chest radiograph FINDINGS: This is a low volume film. The cardiomediastinal silhouette is unremarkable. Left posterior lower lobe opacity may represent atelectasis or airspace disease. Mild peribronchial thickening again noted.  There is no evidence of definite pleural effusion or pneumothorax. IMPRESSION: Left basilar opacity-question atelectasis versus airspace disease/ pneumonia. Electronically Signed   By: Margarette Canada M.D.   On: 11/02/2016 21:04   Ct Angio Chest Pe W And/or Wo Contrast  Result Date: 11/03/2016 CLINICAL DATA:  41 y/o F; severe chest pain, question pneumonia, shortness of breath with exertion. EXAM: CT ANGIOGRAPHY CHEST WITH CONTRAST TECHNIQUE: Multidetector CT imaging of the chest was performed using the standard protocol during bolus administration of intravenous contrast. Multiplanar CT image reconstructions and MIPs were obtained to evaluate the vascular anatomy. CONTRAST:  80 cc Isovue 370 COMPARISON:  11/02/2016 chest radiograph. FINDINGS: Cardiovascular: Satisfactory opacification of the pulmonary arteries to the segmental level. Acute segmental pulmonary emboli are identified in the lower lobes bilaterally (series 7, image 131 and 137). RV/ LV ratio equals 0.8. Normal heart size.  No pericardial effusion. Mediastinum/Nodes: No enlarged mediastinal, hilar, or axillary lymph nodes. Thyroid gland, trachea, and esophagus demonstrate no significant findings. Lungs/Pleura: Small left pleural effusion and minor left lung base dependent atelectasis. No evidence for pneumonia. Upper Abdomen: No acute abnormality. Musculoskeletal: No chest wall abnormality. No acute or significant osseous findings. Review of the MIP images confirms the above findings. IMPRESSION: 1. Acute segmental pulmonary emboli in lower lobes bilaterally. No CT evidence of right heart strain. 2. Small left pleural effusion. 3. No pulmonary consolidation. These results were called by telephone at the time of interpretation on 11/03/2016 at 1:47 am to Dr. Jola Schmidt , who verbally acknowledged these results. Electronically Signed   By: Kristine Garbe M.D.   On: 11/03/2016 01:48    2d echo Study Conclusions  - Left ventricle: The  cavity size was normal. Wall thickness was   increased in a pattern of mild LVH. Systolic function was normal.   The estimated ejection fraction was in the range of 55% to 60%. - Left atrium: The atrium was mildly dilated.   Subjective: Feels better since he has some dyspnea on exertion and mild chest discomfort with movement and deep inspiration.  Discharge Exam: Vitals:   11/04/16 0837 11/04/16 1147  BP: 96/63 140/79  Pulse: 92 82  Resp:    Temp: 98 F (36.7 C) 98.8 F (37.1 C)   Vitals:   11/04/16 0032 11/04/16 0448 11/04/16 0837 11/04/16 1147  BP: 114/60 130/69 96/63 140/79  Pulse: 87 68 92 82  Resp: 18 18    Temp: 98.4 F (36.9 C) 98 F (36.7 C) 98 F (36.7 C) 98.8 F (37.1 C)  TempSrc: Oral Oral Oral Oral  SpO2: 95% 100% 95% 99%  Weight:  (!) 159.6 kg (351 lb 12.8 oz)    Height:       Gen.: Middle aged morbidly obese female not in distress HEENT: Moist mucosa, supple neck Chest: Clear to auscultation bilaterally, no added sounds CVS: Normal S1 and S2, no murmurs rub or gallop GI: Soft, nondistended, nontender Musculoskeletal: Warm, No edema, no calf tenderness      The results of significant diagnostics from this hospitalization (including imaging, microbiology, ancillary and laboratory) are listed below for reference.     Microbiology: No results found  for this or any previous visit (from the past 240 hour(s)).   Labs: BNP (last 3 results) No results for input(s): BNP in the last 8760 hours. Basic Metabolic Panel:  Recent Labs Lab 11/02/16 2013  NA 136  K 3.6  CL 103  CO2 24  GLUCOSE 108*  BUN 5*  CREATININE 0.68  CALCIUM 9.4   Liver Function Tests: No results for input(s): AST, ALT, ALKPHOS, BILITOT, PROT, ALBUMIN in the last 168 hours. No results for input(s): LIPASE, AMYLASE in the last 168 hours. No results for input(s): AMMONIA in the last 168 hours. CBC:  Recent Labs Lab 11/02/16 2013 11/04/16 0416  WBC 8.4 7.5  HGB 12.2  10.7*  HCT 38.2 34.5*  MCV 84.3 84.8  PLT 278 227   Cardiac Enzymes: No results for input(s): CKTOTAL, CKMB, CKMBINDEX, TROPONINI in the last 168 hours. BNP: Invalid input(s): POCBNP CBG: No results for input(s): GLUCAP in the last 168 hours. D-Dimer No results for input(s): DDIMER in the last 72 hours. Hgb A1c No results for input(s): HGBA1C in the last 72 hours. Lipid Profile No results for input(s): CHOL, HDL, LDLCALC, TRIG, CHOLHDL, LDLDIRECT in the last 72 hours. Thyroid function studies No results for input(s): TSH, T4TOTAL, T3FREE, THYROIDAB in the last 72 hours.  Invalid input(s): FREET3 Anemia work up No results for input(s): VITAMINB12, FOLATE, FERRITIN, TIBC, IRON, RETICCTPCT in the last 72 hours. Urinalysis No results found for: COLORURINE, APPEARANCEUR, LABSPEC, [place], GLUCOSEU, HGBUR, BILIRUBINUR, KETONESUR, PROTEINUR, UROBILINOGEN, NITRITE, LEUKOCYTESUR Sepsis Labs Invalid input(s): PROCALCITONIN,  WBC,  LACTICIDVEN Microbiology No results found for this or any previous visit (from the past 240 hour(s)).   Time coordinating discharge: Over 30 minutes  SIGNED:   Louellen Molder, MD  [facility] 11/04/2016, 5:24 PM Pager   If 7PM-7AM, please contact night-coverage [email or web address] Password [email or web address]

## 2016-11-04 NOTE — Progress Notes (Signed)
ANTICOAGULATION CONSULT NOTE  Pharmacy Consult for Heparin Indication: pulmonary embolus  No Known Allergies  Patient Measurements: Height: 5\' 10"  (177.8 cm) Weight: (!) 351 lb 12.8 oz (159.6 kg) IBW/kg (Calculated) : 68.5 Heparin Dosing Weight: 110 kg  Vital Signs: Temp: 98 F (36.7 C) (06/28 0837) Temp Source: Oral (06/28 0837) BP: 96/63 (06/28 0837) Pulse Rate: 92 (06/28 0837)  Labs:  Recent Labs  11/02/16 2013 11/03/16 1040 11/03/16 1634 11/04/16 0416  HGB 12.2  --   --  10.7*  HCT 38.2  --   --  34.5*  PLT 278  --   --  227  HEPARINUNFRC  --  0.53 0.51 0.45  CREATININE 0.68  --   --   --     Estimated Creatinine Clearance: 154.8 mL/min (by C-G formula based on SCr of 0.68 mg/dL).   Medical History: Past Medical History:  Diagnosis Date  . Asthma     Medications:  No current facility-administered medications on file prior to encounter.    No current outpatient prescriptions on file prior to encounter.     Assessment: 41 y.o. female with new PE on IV heparin. LE doppler negative for DVT, AM heparin level = 0.45, remains  therapeutic on 1750 units/hr. Plan to switch to eliquis this AM.  Goal of Therapy:  Heparin level 0.3-0.7 units/ml Monitor platelets by anticoagulation protocol: Yes   Plan:  Eliquis 10 mg BID x 7 days first dose tomorrow at 1000 Switch Eliquis to 5mg  BID on 7/5 D/c IV heparin when first dose eliquis is given  Maryanna Shape, PharmD, BCPS  Clinical Pharmacist  Pager: [phone]   11/04/2016,9:30 AM

## 2016-11-04 NOTE — Progress Notes (Signed)
  Echocardiogram 2D Echocardiogram has been performed.  Jennette Dubin 11/04/2016, 3:11 PM

## 2016-11-05 LAB — BETA-2-GLYCOPROTEIN I ABS, IGG/M/A
Beta-2-Glycoprotein I IgA: <9 GPI IgA units (ref 0–25)
Beta-2-Glycoprotein I IgM: <9 GPI IgM units (ref 0–32)

## 2016-11-05 LAB — LUPUS ANTICOAGULANT PANEL
DRVVT: 48.8 s — AB (ref 0.0–47.0)
PTT LA: 46.8 s (ref 0.0–51.9)

## 2016-11-05 LAB — DRVVT MIX: DRVVT MIX: 41.1 s (ref 0.0–47.0)

## 2016-11-05 LAB — PROTEIN C, TOTAL: PROTEIN C, TOTAL: 106 % (ref 60–150)

## 2016-11-08 LAB — FACTOR 5 LEIDEN

## 2016-11-08 LAB — PROTHROMBIN GENE MUTATION

## 2016-11-11 ENCOUNTER — Telehealth: Payer: Self-pay | Admitting: Family Medicine

## 2016-11-11 NOTE — Telephone Encounter (Signed)
Noted  

## 2016-11-11 NOTE — Telephone Encounter (Signed)
Pt is not a pt her until 7/18 and would like to have FMLA pw filled out and she is aware that our office will not be able to fill out the pw.

## 2016-11-12 DIAGNOSIS — Z0289 Encounter for other administrative examinations: Secondary | ICD-10-CM

## 2016-11-15 ENCOUNTER — Encounter: Payer: Self-pay | Admitting: Family Medicine

## 2016-11-15 ENCOUNTER — Ambulatory Visit (INDEPENDENT_AMBULATORY_CARE_PROVIDER_SITE_OTHER): Payer: 59 | Admitting: Family Medicine

## 2016-11-15 ENCOUNTER — Ambulatory Visit: Payer: 59 | Attending: Family Medicine

## 2016-11-15 ENCOUNTER — Telehealth: Payer: Self-pay | Admitting: Family Medicine

## 2016-11-15 VITALS — BP 120/78 | HR 99 | Resp 12 | Ht 70.0 in | Wt 350.2 lb

## 2016-11-15 DIAGNOSIS — R2689 Other abnormalities of gait and mobility: Secondary | ICD-10-CM

## 2016-11-15 DIAGNOSIS — I2699 Other pulmonary embolism without acute cor pulmonale: Secondary | ICD-10-CM

## 2016-11-15 DIAGNOSIS — R0600 Dyspnea, unspecified: Secondary | ICD-10-CM | POA: Diagnosis not present

## 2016-11-15 DIAGNOSIS — R5381 Other malaise: Secondary | ICD-10-CM | POA: Diagnosis not present

## 2016-11-15 DIAGNOSIS — R0789 Other chest pain: Secondary | ICD-10-CM | POA: Diagnosis not present

## 2016-11-15 DIAGNOSIS — E669 Obesity, unspecified: Secondary | ICD-10-CM

## 2016-11-15 DIAGNOSIS — M25551 Pain in right hip: Secondary | ICD-10-CM

## 2016-11-15 DIAGNOSIS — J45909 Unspecified asthma, uncomplicated: Secondary | ICD-10-CM | POA: Diagnosis not present

## 2016-11-15 DIAGNOSIS — IMO0001 Reserved for inherently not codable concepts without codable children: Secondary | ICD-10-CM

## 2016-11-15 DIAGNOSIS — R600 Localized edema: Secondary | ICD-10-CM | POA: Diagnosis not present

## 2016-11-15 DIAGNOSIS — Z6841 Body Mass Index (BMI) 40.0 and over, adult: Secondary | ICD-10-CM

## 2016-11-15 DIAGNOSIS — M6281 Muscle weakness (generalized): Secondary | ICD-10-CM | POA: Insufficient documentation

## 2016-11-15 MED ORDER — APIXABAN 5 MG PO TABS: 5.0000 mg | ORAL_TABLET | Freq: Two times a day (BID) | ORAL | 2 refills | Status: DC

## 2016-11-15 NOTE — Patient Instructions (Signed)
KNEE: Extension, Long Arc Quad (Weight)  Place weight around leg. Raise leg until knee is straight. Hold _5__ seconds. Use ___ lb weight. _10__ reps per set (each leg), 4_ sets per day, __7_ days per week   Knee Raise   Lift knee and then lower it. Repeat with other knee. Repeat _10__ times each leg. Do _4___ sessions per day.   Knee High   Holding stable object, raise knee to hip level, then lower knee. Repeat with other knee. Complete __10_ repetitions. Do __4__ sessions per day.  Hip Flexor Stretch    Lying on back near edge of bed, bend one leg, foot flat. Hang other leg over edge, relaxed, thigh resting entirely on bed for 20 seconds. Repeat __3__ times. Do __3__ sessions per day.     [facility] [address], [place], [place] [address] Phone # [phone] Fax [phone]

## 2016-11-15 NOTE — Telephone Encounter (Signed)
Okay to send refill in?

## 2016-11-15 NOTE — Telephone Encounter (Signed)
Rx sent. Thanks, BJ 

## 2016-11-15 NOTE — Telephone Encounter (Signed)
° °  Pt request refill of the following:  apixaban (ELIQUIS STARTER PACK) 5 MG TABS tablet   Phamacy: [place]

## 2016-11-15 NOTE — Progress Notes (Signed)
HPI:   [name] is a 41 y.o. female, who is here today to establish care.  Former PCP: N/A, used acute care prn. Last preventive routine visit: 02/2016 routine gyn.  Chronic medical problems: Vit D deficiency, glaucoma, allergic rhinitis, prediabetes,asthma,and recently Dx with PE.  She lives alone. She has family that lives close by.  Concerns today:   She was hospitalized recently because bilateral PE, from 6/26 to 11/04/2016. She was discharged on Eliquis and currently she is on 5 mg bid. She is tolerating medication well, no side effects reported.  She is inquiring about FMLA and short term disability, she states that she is not ready to go back to work. States that she cannot even talk because she feels "exhausted". She has been off of work since 11/03/15 to date. She tried to go back to work on 11/11/16 but she could not walk from parking lot to building, so she was sent home by supervisor.   She denies prior Hx of dyspnea and in general it has improved since hospital discharge.  Chest CTA 11/03/16: 1. Acute segmental pulmonary emboli in lower lobes bilaterally. No CT evidence of right heart strain. 2. Small left pleural effusion. 3. No pulmonary consolidation.  She has Hx of asthma, she has had some wheezing, no cough. Mid chest pain, no radiated, intermittently, 4/10, and also improving.  C/O severe right hip and thigh pain,7/10, constant dull and intermittent burning. Exacerbated by movement. She denies prior Hx and she attributed to DVT.  LE vein [place] 11/03/16:- No evidence of deep vein or superficial thrombosis involving the right lower extremity and left lower extremity. - No evidence of Baker&'s cyst on the right or left.  She is taking Tylenol for pain, has not taken any today.  She is also concerned about options in regard to birth control.   -Requesting "fluid pill" for LE edema.States that LE edema is not bad in the mornign but worse at the  end of the day. No erythema.  She has not been consistent with a healthy diet but since she was discharged from hospital she is trying to eat healthier and has noted some wt loss. She does not exercise regularly.   Review of Systems  Constitutional: Positive for fatigue. Negative for activity change, appetite change and fever.  HENT: Negative for mouth sores, nosebleeds and trouble swallowing.   Eyes: Negative for redness and visual disturbance.  Respiratory: Positive for shortness of breath and wheezing. Negative for cough.   Cardiovascular: Positive for leg swelling. Negative for palpitations.  Gastrointestinal: Negative for abdominal pain, nausea and vomiting.       Negative for changes in bowel habits.  Endocrine: Negative for polydipsia, polyphagia and polyuria.  Genitourinary: Negative for decreased urine volume, dysuria and hematuria.  Musculoskeletal: Positive for arthralgias and back pain. Negative for joint swelling.  Skin: Negative for rash and wound.  Allergic/Immunologic: Positive for environmental allergies.  Neurological: Negative for syncope, weakness and headaches.  Hematological: Negative for adenopathy. Does not bruise/bleed easily.  Psychiatric/Behavioral: Negative for confusion. The patient is nervous/anxious.       Current Outpatient Prescriptions on File Prior to Visit  Medication Sig Dispense Refill  . albuterol (PROVENTIL HFA;VENTOLIN HFA) 108 (90 Base) MCG/ACT inhaler Inhale 1-2 puffs into the lungs every 6 (six) hours as needed for wheezing or shortness of breath.    . cetirizine (ZYRTEC) 10 MG tablet Take 10 mg by mouth daily.    . timolol (TIMOPTIC)  0.5 % ophthalmic solution Place 1 drop into both eyes every morning.     No current facility-administered medications on file prior to visit.      Past Medical History:  Diagnosis Date  . Allergy   . Asthma    No Known Allergies  Family History  Problem Relation Age of Onset  . Pulmonary embolism  Mother        Last year  . Mental retardation Mother        PTSD and fibromyalgia  . Heart disease Mother        CHF  . Heart disease Father 15       CAD  . Hyperlipidemia Father   . Diabetes Paternal Grandmother     Social History   Social History  . Marital status: Single    Spouse name: N/A  . Number of children: N/A  . Years of education: N/A   Social History Main Topics  . Smoking status: Never Smoker  . Smokeless tobacco: Never Used  . Alcohol use Yes  . Drug use: No  . Sexual activity: Not Asked   Other Topics Concern  . None   Social History Narrative  . None    Vitals:   11/15/16 0846  BP: 120/78  Pulse: 99  Resp: 12   O2 sat at RA 97% Body mass index is 50.26 kg/m.   Physical Exam  Nursing note and vitals reviewed. Constitutional: She is oriented to person, place, and time. She appears well-developed. No distress.  HENT:  Head: Atraumatic.  Mouth/Throat: Oropharynx is clear and moist and mucous membranes are normal.  Eyes: Conjunctivae and EOM are normal. Pupils are equal, round, and reactive to light.  Neck: No tracheal deviation present. No thyroid mass and no thyromegaly present.  Cardiovascular: Normal rate and regular rhythm.   No murmur heard. Pulses:      Dorsalis pedis pulses are 2+ on the right side, and 2+ on the left side.  Respiratory: Effort normal and breath sounds normal. No respiratory distress. She exhibits tenderness.  GI: Soft. She exhibits no mass. There is no hepatomegaly. There is no tenderness.  Musculoskeletal: She exhibits edema (Trace pitting edema LE, bilateral.).  Tenderness lumbar paraspinal muscles R>L. Severe pain voiced with movement of right hip. Chest wall pain R>L.   Lymphadenopathy:    She has no cervical adenopathy.  Neurological: She is alert and oriented to person, place, and time. She has normal strength. Coordination normal.  Antalgic gait but stable with no assistance required.  Skin: Skin is warm.  No rash noted. No erythema.  Psychiatric: Her mood appears anxious.  Well groomed, poor eye contact.    ASSESSMENT AND PLAN:   Ms. Holly Mcdonald was seen today for hospitalization follow-up and establish care.  Diagnoses and all orders for this visit:  Right hip pain  No edema and [place] negative for DVT. ? Radicular,OA,or muscular. ROM exercises, PT recommended. For now will hold on imaging since there is no Hx of trauma.   -     Ambulatory referral to Physical Therapy  Dyspnea, unspecified type  Improving, could be related to PE but also to asthma,deconditioning, and/or obesity. Instructed about warning signs.  Chest wall pain  Seems musculoskeletal. Avoid shallow breathing. Reported as improving. Instructed about warning signs.  Bilateral lower extremity edema  Reviewing records, she has prior Hx of LE edema and was on lasix. For now LE elevation , low salt diet,and wt loss.  Bilateral pulmonary embolism ([place])  Educated about Dx and treatment recommendations. Planning on anticoagulation for 3-4 months. Work-up negative except for mildly elevated Lupus anticoagulant. Wt loss strongly recommended.  In regard to birth control options, will consider a progesterone one: oral,IUD, or injectable. Because wt gain is a common side effect I think Mirena is a good option. For now condoms.  -     apixaban (ELIQUIS STARTER PACK) 5 MG TABS tablet; Take 1 tablet (5 mg total) by mouth 2 (two) times daily.  Asthma in adult, unspecified asthma severity, uncomplicated  Could be aggravating dyspnea. Recommend using Albuterol inh as needed for dyspnea and wheezing.  Physical deconditioning -     Ambulatory referral to Physical Therapy  Class 3 obesity with serious comorbidity and body mass index (BMI) of 50.0 to 59.9 in adult, unspecified obesity type ([place])   We discussed benefits of wt loss as well as adverse effects of obesity. Consistency with healthy diet and physical activity  recommended.   FMLA from 11/02/16 to 12/08/16, she will drop forms.    Aaniya Sterba G. [place], MD  [facility]. [place] office.

## 2016-11-15 NOTE — Therapy (Signed)
[facility] [address], [place], [place], [address] Phone: [phone]   Fax:  [phone]  Physical Therapy Evaluation  Patient Details  Name: Holly Mcdonald MRN: [id number] Date of Birth: Oct 04, 1975 Referring Provider: Martinique, Betty, MD  Encounter Date: 11/15/2016      PT End of Session - 11/15/16 1650    Visit Number 1   Date for PT Re-Evaluation 01/10/17   PT Start Time 9924   PT Stop Time 1648   PT Time Calculation (min) 34 min   Activity Tolerance Patient limited by pain   Behavior During Therapy [facility] for tasks assessed/performed      Past Medical History:  Diagnosis Date  . Asthma     Past Surgical History:  Procedure Laterality Date  . RECTAL EXAMINATION UNDER ANESTHESIA W/ CYSTOSCOPY    . TONSILLECTOMY      There were no vitals filed for this visit.       Subjective Assessment - 11/15/16 1618    Subjective Pt presents to PT with complaints of Rt hip pain that began in May 2018 without cause.  Pt had pulmanory embolism with 2 day hospital stay 6/26-6/28 for this.  Pt believes that she had a blood clot in the Rt groin and that might be a contributing factor to her pain.  Pt with reduced endurance due to PE and hospital stay.     Pertinent History pulmonary embolism 11/02/16 with reduced lung capacity/endurance   Limitations Walking   How long can you walk comfortably? 10 minutes   Diagnostic tests ultrasound to Rt groin   Patient Stated Goals increase endurance, reduce Rt hip pain   Currently in Pain? Yes   Pain Score 4    Pain Location Groin   Pain Orientation Right   Pain Descriptors / Indicators Aching;Burning;Dull   Pain Type Acute pain   Pain Onset More than a month ago   Pain Frequency Constant   Aggravating Factors  "everything", movement, hip flexion without assistance   Pain Relieving Factors Tylenol, pain medication            [facility] PT Assessment - 11/15/16 0001      Assessment   Medical Diagnosis physical deconditioning, right hip pain   Referring Provider Martinique, Betty, MD   Onset Date/Surgical Date 11/02/16   Next MD Visit 12/07/16   Prior Therapy none     Precautions   Precautions Other (comment)  recent PE     Restrictions   Weight Bearing Restrictions No     Balance Screen   Has the patient fallen in the past 6 months No   Has the patient had a decrease in activity level because of a fear of falling?  No   Is the patient reluctant to leave their home because of a fear of falling?  No     Home Environment   Living Environment Private residence   Living Arrangements Alone   Type of [place] Two level     Prior Function   Level of Independence Independent   Vocation Full time employment   Vocation Requirements Pt is out of work due to pulmonary embolism.  Pt works in Therapist, art and talks on the phone     Cognition   Overall Cognitive Status Within Functional Limits for tasks assessed     Observation/Other Assessments   Focus on Therapeutic Outcomes (FOTO)  57% limitation     Posture/Postural Control  Posture/Postural Control Postural limitations   Postural Limitations Flexed trunk;Weight shift left     ROM / Strength   AROM / PROM / Strength PROM;AROM;Strength     AROM   Overall AROM  Deficits   Overall AROM Comments Lt hip A/ROM and lumbar A/ROM are WFLs.  Rt hip not able to be tested due to pain with movement     PROM   Overall PROM  Deficits   Overall PROM Comments Rt hip limited by pain     Strength   Overall Strength Deficits   Overall Strength Comments Pt not able to perfrom Rt hip flexion without pain and uses arms to assist with transfers.  Not able to test Rt LE due to pain.  Lt LE 4+/5.     Palpation   Palpation comment Pt with marked palpable tenderness over Rt lateral quad medial muscle belly and along Rt groin.  Negative for tenderness in all other areas of the Rt LE     Transfers    Transfers Sit to Stand;Stand to Sit;Sit to Supine   Sit to Stand With upper extremity assist   Stand to Sit With upper extremity assist   Sit to Supine 6: Modified independent (Device/Increase time)   Comments Pt uses UEs to asssit with Rt LE during all transfers     Ambulation/Gait   Ambulation/Gait Yes   Ambulation/Gait Assistance 6: Modified independent (Device/Increase time)   Gait Pattern Step-through pattern;Antalgic   Ambulation Surface Level   Gait Comments decreased time spent in stance on the Rt with weight shift Lt            Objective measurements completed on examination: See above findings.                  PT Education - 11/15/16 1642    Education provided Yes   Education Details Rt LE strength and flexibility   Person(s) Educated Patient   Methods Explanation;Handout;Demonstration   Comprehension Verbalized understanding;Returned demonstration          PT Short Term Goals - 11/15/16 1600      PT SHORT TERM GOAL #1   Title be independent in initial HEP   Time 4   Period Weeks   Status New     PT SHORT TERM GOAL #2   Title improve Rt LE strength to get in/out of her car without UE assitance   Time 4   Period Weeks   Status New     PT SHORT TERM GOAL #3   Title report < or = to 5/10 Rt LE pain with standing and walking   Time 4   Period Weeks   Status New     PT SHORT TERM GOAL #4   Title walk for 5-10 minutes without fatigue or need to rest   Time 4   Period Weeks   Status New           PT Long Term Goals - 11/15/16 1601      PT LONG TERM GOAL #1   Title be independent in advanced HEP   Time 8   Period Weeks   Status New     PT LONG TERM GOAL #2   Title reduce FOTO to < or = to 38% limitation   Time 8   Period Weeks   Status New     PT LONG TERM GOAL #3   Title get in and out of bed without UE assistance with Rt LE  Time 8   Period Weeks   Status New     PT LONG TERM GOAL #4   Title improve  cardiovascular endurance to walk for 15-20 minutes without rest   Time 8   Period Weeks   Status New     PT LONG TERM GOAL #5   Title report < or = to 3/10 Rt LE pain with standing and walking   Time 8   Period Weeks   Status New     Additional Long Term Goals   Additional Long Term Goals Yes     PT LONG TERM GOAL #6   Title demonstrate >or = to 4/5 Rt hip strength and full A/ROM to improve functional mobility   Time 8   Period Weeks   Status New     PT LONG TERM GOAL #7   Title demonstrate symmetry with gait on level surface   Time 8   Period Weeks   Status New                Plan - 11/15/16 1651    Clinical Impression Statement Pt presents to PT with complatints of Rt groin and thigh pain that began in May 2018 without incident.  Pt had a pulmonary embolism 2 weeks ago with 2 day hospital stay.  Pt now has limited cardiovascular endurance.  Pt demonstrates antalgic gait, has pain and requires assistance with movement of Rt LE with transfers. has limited A/ROM and strength due to pain.  Pt with palpable tenderness over Rt groin and lateral quadriceps medial muscle belly.  Pt will benefit from skilled PT to improve cardiovascualr endurance for home, work and community independence and Rt hip strength, endurance, flexibility and pain management to normalize gait and allow for return to prior level of function.     History and Personal Factors relevant to plan of care: recent pulmonary embolism with limited pulmonary capacity   Clinical Presentation Evolving   Clinical Presentation due to: Rt hip pain is worsening and inability to lift leg to get in/out of car or bed since stay in hospital   Clinical Decision Making Moderate   Rehab Potential Good   PT Frequency 2x / week   PT Duration 8 weeks   PT Treatment/Interventions ADLs/Self Care Home Management;Cryotherapy;Electrical Stimulation;Functional mobility training;Stair training;Ultrasound;Therapeutic activities;Moist  Heat;Therapeutic exercise;Neuromuscular re-education;Patient/family education;Passive range of motion;Dry needling;Taping   PT Next Visit Plan 6 minute walk test, build endurance as tolerated, Rt LE mobility and strength   Consulted and Agree with Plan of Care Patient      Patient will benefit from skilled therapeutic intervention in order to improve the following deficits and impairments:  Abnormal gait, Decreased range of motion, Increased muscle spasms, Decreased endurance, Cardiopulmonary status limiting activity, Decreased activity tolerance, Pain, Decreased strength, Impaired flexibility  Visit Diagnosis: Muscle weakness (generalized) - Plan: PT plan of care cert/re-cert  Pain in right hip - Plan: PT plan of care cert/re-cert  Other abnormalities of gait and mobility - Plan: PT plan of care cert/re-cert     Problem List Patient Active Problem List   Diagnosis Date Noted  . Asthma in adult, unspecified asthma severity, uncomplicated 75/91/6384  . Obesity, Class III, BMI 40-49.9 (morbid obesity) ([place]) 11/04/2016  . Pulmonary embolus ([place])   . Bilateral pulmonary embolism ([place]) 11/03/2016     Sigurd Sos, PT 11/15/16 5:04 PM  [facility] [address] [place], [place], [address] Phone: [phone]   Fax:  [phone]  Name: Holly Mcdonald MRN: [id number] Date of Birth: 1976-02-20

## 2016-11-15 NOTE — Patient Instructions (Signed)
A few things to remember from today's visit:   Chest wall pain  Dyspnea, unspecified type  Bilateral lower extremity edema  Bilateral pulmonary embolism (HCC)  Asthma in adult, unspecified asthma severity, uncomplicated  Avoid pregnancy, condoms for now. Walk daily as tolerated. Elevation of lower extremities for swelling.  Avoid shallow breading.    Please be sure medication list is accurate. If a new problem present, please set up appointment sooner than planned today.

## 2016-11-17 ENCOUNTER — Ambulatory Visit: Payer: 59 | Admitting: Physical Therapy

## 2016-11-17 ENCOUNTER — Encounter: Payer: Self-pay | Admitting: Physical Therapy

## 2016-11-17 ENCOUNTER — Telehealth: Payer: Self-pay | Admitting: Family Medicine

## 2016-11-17 DIAGNOSIS — M6281 Muscle weakness (generalized): Secondary | ICD-10-CM

## 2016-11-17 DIAGNOSIS — R2689 Other abnormalities of gait and mobility: Secondary | ICD-10-CM

## 2016-11-17 DIAGNOSIS — M25551 Pain in right hip: Secondary | ICD-10-CM

## 2016-11-17 NOTE — Therapy (Signed)
[facility] [address] [place], [place], [address] Phone: [phone]   Fax:  [phone]  Physical Therapy Treatment  Patient Details  Name: Holly Mcdonald MRN: [id number] Date of Birth: 07-21-1975 Referring Provider: Martinique, Betty, MD  Encounter Date: 11/17/2016      PT End of Session - 11/17/16 1529    Visit Number 2   Date for PT Re-Evaluation 01/10/17   PT Start Time 1448   PT Stop Time 1530   PT Time Calculation (min) 44 min   Activity Tolerance Patient limited by pain   Behavior During Therapy [facility] for tasks assessed/performed      Past Medical History:  Diagnosis Date  . Asthma     Past Surgical History:  Procedure Laterality Date  . RECTAL EXAMINATION UNDER ANESTHESIA W/ CYSTOSCOPY    . TONSILLECTOMY      There were no vitals filed for this visit.      Subjective Assessment - 11/17/16 1450    Subjective Pt reports sleeping a lot yesterday.  She did stretch and marching and states her hip is feeling a little better.  Went up and down the stairs 3x yesterday.     Currently in Pain? Yes   Pain Score 3    Pain Location Groin   Pain Orientation Right   Pain Descriptors / Indicators Aching   Pain Type Acute pain   Pain Onset More than a month ago                         [facility] Adult PT Treatment/Exercise - 11/17/16 0001      Ambulation/Gait   Gait Comments 6 min walk test - 887 ft     Exercises   Exercises Knee/Hip     Knee/Hip Exercises: Aerobic   Nustep L1 x 66min 48 sec     Knee/Hip Exercises: Seated   Heel Slides Strengthening;Right;20 reps     Knee/Hip Exercises: Supine   Hip Adduction Isometric Strengthening;Both;5 reps  10 sec hold   Bridges Strengthening;10 reps  mini bridge   Bridges with Clamshell Strengthening;10 reps;Both  clamshell without bridge - yellow band                PT Education - 11/17/16 1528    Education provided Yes   Education Details ball squeeze, hip abduction, hamstring stretch   Person(s) Educated Patient   Methods Explanation   Comprehension Verbalized understanding          PT Short Term Goals - 11/17/16 1531      PT SHORT TERM GOAL #1   Title be independent in initial HEP   Time 4   Period Weeks   Status On-going     PT SHORT TERM GOAL #2   Title improve Rt LE strength to get in/out of her car without UE assitance   Time 4   Period Weeks   Status On-going     PT SHORT TERM GOAL #3   Title report < or = to 5/10 Rt LE pain with standing and walking   Time 4   Period Weeks   Status On-going     PT SHORT TERM GOAL #4   Title walk for 5-10 minutes without fatigue or need to rest   Time 4   Period Weeks   Status On-going           PT Long Term Goals - 11/15/16 1601  PT LONG TERM GOAL #1   Title be independent in advanced HEP   Time 8   Period Weeks   Status New     PT LONG TERM GOAL #2   Title reduce FOTO to < or = to 38% limitation   Time 8   Period Weeks   Status New     PT LONG TERM GOAL #3   Title get in and out of bed without UE assistance with Rt LE   Time 8   Period Weeks   Status New     PT LONG TERM GOAL #4   Title improve cardiovascular endurance to walk for 15-20 minutes without rest   Time 8   Period Weeks   Status New     PT LONG TERM GOAL #5   Title report < or = to 3/10 Rt LE pain with standing and walking   Time 8   Period Weeks   Status New     Additional Long Term Goals   Additional Long Term Goals Yes     PT LONG TERM GOAL #6   Title demonstrate >or = to 4/5 Rt hip strength and full A/ROM to improve functional mobility   Time 8   Period Weeks   Status New     PT LONG TERM GOAL #7   Title demonstrate symmetry with gait on level surface   Time 8   Period Weeks   Status New               Plan - 11/17/16 1739    Clinical Impression Statement Patient completed 6 min walk test needing several standing rest breaks.   Pt had difficutly breathing throughout treatment but able to regulate breathing with cues to breath in through nose.  Pt is very irritable with hip flexion but tolerated exercises well.  Pt will benefit from skilled PT for strengthening in order to return to functional activities.   PT Treatment/Interventions ADLs/Self Care Home Management;Cryotherapy;Electrical Stimulation;Functional mobility training;Stair training;Ultrasound;Therapeutic activities;Moist Heat;Therapeutic exercise;Neuromuscular re-education;Patient/family education;Passive range of motion;Dry needling;Taping   PT Next Visit Plan endurance and stregth as tolerated, right hip mobility and strength   Consulted and Agree with Plan of Care Patient      Patient will benefit from skilled therapeutic intervention in order to improve the following deficits and impairments:  Abnormal gait, Decreased range of motion, Increased muscle spasms, Decreased endurance, Cardiopulmonary status limiting activity, Decreased activity tolerance, Pain, Decreased strength, Impaired flexibility  Visit Diagnosis: Muscle weakness (generalized)  Pain in right hip  Other abnormalities of gait and mobility     Problem List Patient Active Problem List   Diagnosis Date Noted  . Asthma in adult, unspecified asthma severity, uncomplicated 85/06/7739  . Obesity, Class III, BMI 40-49.9 (morbid obesity) ([employer]) 11/04/2016  . Pulmonary embolus ([place])   . Bilateral pulmonary embolism ([place]) 11/03/2016    Zannie Cove, PT 11/17/2016, 5:46 PM  [facility] [address], [place], [place], [address] Phone: [phone]   Fax:  [phone]  Name: Holly Mcdonald MRN: [id number] Date of Birth: [birth year]

## 2016-11-17 NOTE — Telephone Encounter (Signed)
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

## 2016-11-17 NOTE — Patient Instructions (Signed)
Chair Sitting    Sit at edge of seat, spine straight, one leg extended. Put a hand on each thigh and bend forward from the hip, keeping spine straight. Allow hand on extended leg to reach toward toes. Support upper body with other arm. Hold _10__ seconds. Repeat __5_ times per session. Do __1_ sessions per day.  Copyright  VHI. All rights reserved.   Bridge    Lie back, legs bent. Inhale, pressing hips up. Keeping ribs in, lengthen lower back. Exhale, rolling down along spine from top. Just lift up slightly from the bed/mat Repeat ___10_ times. Do __1__ sessions per day.  http://pm.exer.us/55   Copyright  VHI. All rights reserved.     HIP ADDUCTION SQUEEZE - SUPINE  Place a rolled up towel, ball or pillow between your knees and press your knees together so that you squeeze the object firmly. Hold 5 seconds and then release and repeat 10x    SUPINE HIP ABDUCTION - ELASTIC BAND CLAMS  Lie down on your back with your knees bent. Place an elastic band around your knees and then draw your knees apart, then slowly back together  Repeat 20x

## 2016-11-18 ENCOUNTER — Encounter: Payer: Self-pay | Admitting: Family Medicine

## 2016-11-18 NOTE — Telephone Encounter (Signed)
Form received and filled out.  Placed in Dr. Doug Sou folder.  Dr. Martinique to return to the office on 11/19/16.

## 2016-11-22 ENCOUNTER — Ambulatory Visit: Payer: 59 | Admitting: Physical Therapy

## 2016-11-22 DIAGNOSIS — M25551 Pain in right hip: Secondary | ICD-10-CM

## 2016-11-22 DIAGNOSIS — R2689 Other abnormalities of gait and mobility: Secondary | ICD-10-CM

## 2016-11-22 DIAGNOSIS — M6281 Muscle weakness (generalized): Secondary | ICD-10-CM | POA: Diagnosis not present

## 2016-11-22 NOTE — Therapy (Signed)
[facility] [address], [place], [place], [address] Phone: [phone]   Fax:  [phone]  Physical Therapy Treatment  Patient Details  Name: Holly Mcdonald MRN: [id number] Date of Birth: 12-Feb-1976 Referring Provider: Martinique, Betty, MD  Encounter Date: 11/22/2016      PT End of Session - 11/22/16 0857    Visit Number 3   Date for PT Re-Evaluation 01/10/17   PT Start Time 0845   PT Stop Time 0928   PT Time Calculation (min) 43 min   Activity Tolerance Patient limited by pain   Behavior During Therapy [facility] for tasks assessed/performed      Past Medical History:  Diagnosis Date  . Allergy   . Asthma     Past Surgical History:  Procedure Laterality Date  . RECTAL EXAMINATION UNDER ANESTHESIA W/ CYSTOSCOPY    . TONSILLECTOMY      There were no vitals filed for this visit.      Subjective Assessment - 11/22/16 0847    Subjective Pt reports using the heat this weekend the right thigh muscle is really sore.  Feels the same.  I did a lot of walking Friday and walked on my street this weekend.  I can lift my leg to get pants on now and have cut back on the Tylenol   Pertinent History pulmonary embolism 11/02/16 with reduced lung capacity/endurance   Limitations Walking   How long can you walk comfortably? 10 minutes   Diagnostic tests ultrasound to Rt groin   Patient Stated Goals increase endurance, reduce Rt hip pain   Currently in Pain? Yes   Pain Score 3    Pain Location Groin   Pain Descriptors / Indicators Aching   Pain Type Acute pain   Pain Onset More than a month ago   Pain Frequency Constant   Aggravating Factors  hip flexion   Multiple Pain Sites No                         [facility] Adult PT Treatment/Exercise - 11/22/16 0001      Therapeutic Activites    Therapeutic Activities ADL's   ADL's walking 12.5 laps in gym - 6 min     Lumbar Exercises: Supine   Bent Knee Raise  20 reps   Large Ball Abdominal Isometric 10 reps  red ball roll out     Knee/Hip Exercises: Stretches   Active Hamstring Stretch Right;Left;20 seconds;2 reps   Hip Flexor Stretch 2 reps;20 seconds;Left;Right     Knee/Hip Exercises: Aerobic   Nustep L1 x 35mn 48 sec     Knee/Hip Exercises: Supine   Hip Adduction Isometric Strengthening;Both;5 reps  10 sec hold   Bridges Strengthening;10 reps  mini bridge   Bridges with Clamshell Strengthening;10 reps;Both  clamshell without bridge - red band                  PT Short Term Goals - 11/22/16 0902      PT SHORT TERM GOAL #1   Title be independent in initial HEP   Time 4   Period Weeks   Status Achieved     PT SHORT TERM GOAL #2   Title improve Rt LE strength to get in/out of her car without UE assitance   Time 4   Period Weeks   Status Achieved     PT SHORT TERM GOAL #3   Title report < or =  to 5/10 Rt LE pain with standing and walking   Baseline 3/10   Time 4   Period Weeks   Status Achieved     PT SHORT TERM GOAL #4   Title walk for 5-10 minutes without fatigue or need to rest   Time 4   Period Weeks   Status On-going           PT Long Term Goals - 11/22/16 7622      PT LONG TERM GOAL #1   Title be independent in advanced HEP   Time 8   Period Weeks   Status On-going     PT LONG TERM GOAL #2   Title reduce FOTO to < or = to 38% limitation   Time 8   Period Weeks   Status On-going     PT LONG TERM GOAL #3   Title get in and out of bed without UE assistance with Rt LE   Time 8   Period Weeks   Status On-going     PT LONG TERM GOAL #4   Title improve cardiovascular endurance to walk for 15-20 minutes without rest   Time 8   Period Weeks   Status On-going     PT LONG TERM GOAL #5   Title report < or = to 3/10 Rt LE pain with standing and walking   Time 8   Period Weeks   Status On-going     PT LONG TERM GOAL #6   Title demonstrate >or = to 4/5 Rt hip strength and full A/ROM to  improve functional mobility   Time 8   Period Weeks   Status On-going     PT LONG TERM GOAL #7   Title demonstrate symmetry with gait on level surface   Time 8   Period Weeks   Status On-going               Plan - 11/22/16 0901    Clinical Impression Statement Patient demonstrates significant improvements and met short term goals of being able to get into the car without UE support.  Her pain is reduced to 3/10 in standing as well.  Pt able to do LE marching in supine which she was not able to do last visit.  Pt still has pain and weakness in LE and will benefit from skilled PT for improved function and be ablle to return to work.    PT Treatment/Interventions ADLs/Self Care Home Management;Cryotherapy;Electrical Stimulation;Functional mobility training;Stair training;Ultrasound;Therapeutic activities;Moist Heat;Therapeutic exercise;Neuromuscular re-education;Patient/family education;Passive range of motion;Dry needling;Taping   PT Next Visit Plan endurance and stregth as tolerated, right hip mobility and strength   Consulted and Agree with Plan of Care Patient      Patient will benefit from skilled therapeutic intervention in order to improve the following deficits and impairments:  Abnormal gait, Decreased range of motion, Increased muscle spasms, Decreased endurance, Cardiopulmonary status limiting activity, Decreased activity tolerance, Pain, Decreased strength, Impaired flexibility  Visit Diagnosis: Muscle weakness (generalized)  Pain in right hip  Other abnormalities of gait and mobility     Problem List Patient Active Problem List   Diagnosis Date Noted  . Asthma in adult, unspecified asthma severity, uncomplicated 63/33/5456  . Obesity, Class III, BMI 40-49.9 (morbid obesity) ([place]) 11/04/2016  . Pulmonary embolus ([place])   . Bilateral pulmonary embolism ([place]) 11/03/2016    Wayne Both 11/22/2016, 9:31 AM  [facility] [address], [place], [place], [address]  Phone: [phone]   Fax:  [phone]  Name: Holly Mcdonald MRN: [id number] Date of Birth: 1975-06-26

## 2016-11-23 ENCOUNTER — Encounter: Payer: Self-pay | Admitting: Family Medicine

## 2016-11-23 NOTE — Telephone Encounter (Signed)
Form faxed and received confirmation that the fax was successful.  Copy sent to scan and I retained a copy for my records.

## 2016-11-24 ENCOUNTER — Ambulatory Visit: Payer: 59

## 2016-11-24 ENCOUNTER — Ambulatory Visit: Payer: 59 | Admitting: Family Medicine

## 2016-11-24 DIAGNOSIS — M6281 Muscle weakness (generalized): Secondary | ICD-10-CM | POA: Diagnosis not present

## 2016-11-24 DIAGNOSIS — R2689 Other abnormalities of gait and mobility: Secondary | ICD-10-CM

## 2016-11-24 DIAGNOSIS — M25551 Pain in right hip: Secondary | ICD-10-CM

## 2016-11-24 NOTE — Therapy (Addendum)
[facility] [address], [place], [place], [address] Phone: [phone]   Fax:  [phone]  Physical Therapy Treatment  Patient Details  Name: [place] Mcdonald MRN: [id number] Date of Birth: [birth year] Referring Provider: Martinique, Betty, MD  Encounter Date: 11/24/2016      PT End of Session - 11/24/16 1247    Visit Number 4   Date for PT Re-Evaluation 01/10/17   PT Start Time 9242   PT Stop Time 1315   PT Time Calculation (min) 40 min   Activity Tolerance Patient limited by pain   Behavior During Therapy [facility] for tasks assessed/performed      Past Medical History:  Diagnosis Date  . Allergy   . Asthma     Past Surgical History:  Procedure Laterality Date  . RECTAL EXAMINATION UNDER ANESTHESIA W/ CYSTOSCOPY    . TONSILLECTOMY      There were no vitals filed for this visit.      Subjective Assessment - 11/24/16 1238    Subjective Pt. noting she tends to lay down and rest when she is not coming to therapy.     Patient Stated Goals increase endurance, reduce Rt hip pain   Currently in Pain? Yes   Pain Score 2    Pain Orientation Right;Lateral   Pain Descriptors / Indicators Aching   Pain Type Acute pain   Pain Onset More than a month ago   Pain Frequency Constant   Aggravating Factors  hip flexion   Multiple Pain Sites No                         [facility] Adult PT Treatment/Exercise - 11/24/16 1259      Neuro Re-ed    Neuro Re-ed Details  Alternating toe-clears to 12" step x 15 reps each; 2 ski pole support      Lumbar Exercises: Supine   Other Supine Lumbar Exercises Hooklying LE raise from floor to mat table x 10 reps      Knee/Hip Exercises: Stretches   Hip Flexor Stretch 2 reps;Right;30 seconds   Hip Flexor Stretch Limitations sidelying and mod thomas position with strap     Knee/Hip Exercises: Aerobic   Nustep L2 x 10min      Knee/Hip Exercises: Standing   Knee Flexion  Right;Left;15 reps   Knee Flexion Limitations 3# at ankles at counter    Other Standing Knee Exercises side stepping, monster walk forward/backwared with red TB around ankles x 3 laps down/back at counter    Other Standing Knee Exercises Standing march at counter x 15 reps     Knee/Hip Exercises: Seated   Long Arc Quad Right;Left;1 set;10 reps   Long Arc Quad Weight 3 lbs.                  PT Short Term Goals - 11/22/16 0902      PT SHORT TERM GOAL #1   Title be independent in initial HEP   Time 4   Period Weeks   Status Achieved     PT SHORT TERM GOAL #2   Title improve Rt LE strength to get in/out of her car without UE assitance   Time 4   Period Weeks   Status Achieved     PT SHORT TERM GOAL #3   Title report < or = to 5/10 Rt LE pain with standing and walking   Baseline 3/10   Time 4  Period Weeks   Status Achieved     PT SHORT TERM GOAL #4   Title walk for 5-10 minutes without fatigue or need to rest   Time 4   Period Weeks   Status On-going           PT Long Term Goals - 11/22/16 1749      PT LONG TERM GOAL #1   Title be independent in advanced HEP   Time 8   Period Weeks   Status On-going     PT LONG TERM GOAL #2   Title reduce FOTO to < or = to 38% limitation   Time 8   Period Weeks   Status On-going     PT LONG TERM GOAL #3   Title get in and out of bed without UE assistance with Rt LE   Time 8   Period Weeks   Status On-going     PT LONG TERM GOAL #4   Title improve cardiovascular endurance to walk for 15-20 minutes without rest   Time 8   Period Weeks   Status On-going     PT LONG TERM GOAL #5   Title report < or = to 3/10 Rt LE pain with standing and walking   Time 8   Period Weeks   Status On-going     PT LONG TERM GOAL #6   Title demonstrate >or = to 4/5 Rt hip strength and full A/ROM to improve functional mobility   Time 8   Period Weeks   Status On-going     PT LONG TERM GOAL #7   Title demonstrate symmetry  with gait on level surface   Time 8   Period Weeks   Status On-going               Plan - 11/24/16 1251    Clinical Impression Statement Pt. doing well today.  Tolerated mild progression in Nustep and standing strengthening therex well without issue.  A few sitting rest breaks take due to shortness of breath however pt. well motivated overall.  Able to tolerated increased repetitions and marching and toe-clear activities today without issue.     PT Treatment/Interventions ADLs/Self Care Home Management;Cryotherapy;Electrical Stimulation;Functional mobility training;Stair training;Ultrasound;Therapeutic activities;Moist Heat;Therapeutic exercise;Neuromuscular re-education;Patient/family education;Passive range of motion;Dry needling;Taping   PT Next Visit Plan endurance and stregth as tolerated, right hip mobility and strength      Patient will benefit from skilled therapeutic intervention in order to improve the following deficits and impairments:  Abnormal gait, Decreased range of motion, Increased muscle spasms, Decreased endurance, Cardiopulmonary status limiting activity, Decreased activity tolerance, Pain, Decreased strength, Impaired flexibility  Visit Diagnosis: Muscle weakness (generalized)  Pain in right hip  Other abnormalities of gait and mobility     Problem List Patient Active Problem List   Diagnosis Date Noted  . Asthma in adult, unspecified asthma severity, uncomplicated 44/96/7591  . Obesity, Class III, BMI 40-49.9 (morbid obesity) ([place]) 11/04/2016  . Pulmonary embolus ([place])   . Bilateral pulmonary embolism ([place]) 11/03/2016    Bess Harvest, PTA 11/24/16 1:47 PM   [facility] [address], [place], [place], [address] Phone: [phone]   Fax:  [phone]  Name: [place] Mcdonald MRN: [id number] Date of Birth: 1975/09/03

## 2016-11-25 ENCOUNTER — Encounter (HOSPITAL_COMMUNITY): Payer: Self-pay | Admitting: Emergency Medicine

## 2016-11-25 ENCOUNTER — Emergency Department (HOSPITAL_COMMUNITY): Payer: 59

## 2016-11-25 DIAGNOSIS — R0600 Dyspnea, unspecified: Secondary | ICD-10-CM | POA: Insufficient documentation

## 2016-11-25 DIAGNOSIS — I2699 Other pulmonary embolism without acute cor pulmonale: Secondary | ICD-10-CM | POA: Diagnosis not present

## 2016-11-25 DIAGNOSIS — J45909 Unspecified asthma, uncomplicated: Secondary | ICD-10-CM | POA: Diagnosis not present

## 2016-11-25 DIAGNOSIS — R079 Chest pain, unspecified: Secondary | ICD-10-CM | POA: Diagnosis present

## 2016-11-25 NOTE — ED Triage Notes (Signed)
Pt repots central CP and SOB onset 12 hr ago, sharp in nature, non radiating. Hx PE

## 2016-11-26 ENCOUNTER — Emergency Department (HOSPITAL_COMMUNITY): Payer: 59

## 2016-11-26 ENCOUNTER — Emergency Department (HOSPITAL_COMMUNITY)
Admission: EM | Admit: 2016-11-26 | Discharge: 2016-11-26 | Disposition: A | Discharge status: Home / Self Care | Payer: 59 | Attending: Emergency Medicine | Admitting: Emergency Medicine

## 2016-11-26 ENCOUNTER — Encounter (HOSPITAL_COMMUNITY): Payer: Self-pay

## 2016-11-26 DIAGNOSIS — I2699 Other pulmonary embolism without acute cor pulmonale: Secondary | ICD-10-CM

## 2016-11-26 DIAGNOSIS — R0600 Dyspnea, unspecified: Secondary | ICD-10-CM

## 2016-11-26 LAB — CBC
HCT: 35.9 % — ABNORMAL LOW (ref 36.0–46.0)
Hemoglobin: 11.7 g/dL — ABNORMAL LOW (ref 12.0–15.0)
MCH: 27 pg (ref 26.0–34.0)
MCHC: 32.6 g/dL (ref 30.0–36.0)
MCV: 82.7 fL (ref 78.0–100.0)
Platelets: 287 10*3/uL (ref 150–400)
RBC: 4.34 MIL/uL (ref 3.87–5.11)
RDW: 14.2 % (ref 11.5–15.5)
WBC: 8 10*3/uL (ref 4.0–10.5)

## 2016-11-26 LAB — I-STAT TROPONIN, ED
TROPONIN I, POC: 0 ng/mL (ref 0.00–0.08)
Troponin i, poc: 0 ng/mL (ref 0.00–0.08)

## 2016-11-26 LAB — BASIC METABOLIC PANEL
Anion gap: 7 (ref 5–15)
BUN: 11 mg/dL (ref 6–20)
CO2: 22 mmol/L (ref 22–32)
Calcium: 9.3 mg/dL (ref 8.9–10.3)
Chloride: 106 mmol/L (ref 101–111)
Creatinine, Ser: 0.7 mg/dL (ref 0.44–1.00)
GFR calc Af Amer: >60 mL/min (ref >60)
GFR calc non Af Amer: >60 mL/min (ref >60)
Glucose, Bld: 106 mg/dL — ABNORMAL HIGH (ref 65–99)
Potassium: 3.7 mmol/L (ref 3.5–5.1)
Sodium: 135 mmol/L (ref 135–145)

## 2016-11-26 LAB — D-DIMER, QUANTITATIVE (NOT AT ARMC): D-Dimer, Quant: <0.27 ug/mL-FEU (ref 0.00–0.50)

## 2016-11-26 MED ORDER — IOPAMIDOL (ISOVUE-370) INJECTION 76%
INTRAVENOUS | Status: AC
Start: 2016-11-26 — End: 2016-11-26
  Administered 2016-11-26: 100 mL
  Filled 2016-11-26: qty 100

## 2016-11-26 NOTE — ED Notes (Signed)
Patient transported to CT scan . 

## 2016-11-26 NOTE — ED Notes (Signed)
Pt O2 stats stayed 100% while ambulating, the pt stated that she felt like she is short of breath

## 2016-11-26 NOTE — Discharge Instructions (Signed)
Continue your eliquis. Follow up with your doctor. Use your inhaler as needed. Return to the ED if you develop chest pain, shortness of breath or any other concerns.

## 2016-11-26 NOTE — ED Provider Notes (Signed)
[facility] DEPT Provider Note   CSN: [id number] Arrival date & time: 11/25/16  2301  By signing my name below, I, Margit Banda, attest that this documentation has been prepared under the direction and in the presence of Reon Hunley, Annie Main, MD. Electronically Signed: Margit Banda, ED Scribe. 11/26/16. 1:08 AM.  History   Chief Complaint Chief Complaint  Patient presents with  . Chest Pain    HPI [place] Mcdonald is a 41 y.o. female with a PMHx of PE, and asthma who presents to the Emergency Department complaining of intermittent, sharp, chest pain that started on 11/25/16. Associated sx include groin pain, and SOB. Each episode will last for a few seconds at a time. Pain doesn't radiate. Pt denies urinary sx, blood in stool, and back pain. States compliance with her eliquis but missed her evening dose tonight.  The history is provided by the patient. No language interpreter was used.    Past Medical History:  Diagnosis Date  . Allergy   . Asthma     Patient Active Problem List   Diagnosis Date Noted  . Asthma in adult, unspecified asthma severity, uncomplicated 68/04/7516  . Obesity, Class III, BMI 40-49.9 (morbid obesity) ([place]) 11/04/2016  . Pulmonary embolus ([place])   . Bilateral pulmonary embolism ([place]) 11/03/2016    Past Surgical History:  Procedure Laterality Date  . RECTAL EXAMINATION UNDER ANESTHESIA W/ CYSTOSCOPY    . TONSILLECTOMY      OB History    No data available       Home Medications    Prior to Admission medications   Medication Sig Start Date End Date Taking? Authorizing Provider  acetaminophen (TYLENOL) 650 MG CR tablet Take 650 mg by mouth every 8 (eight) hours as needed for pain.    [provider]  albuterol (PROVENTIL HFA;VENTOLIN HFA) 108 (90 Base) MCG/ACT inhaler Inhale 1-2 puffs into the lungs every 6 (six) hours as needed for wheezing or shortness of breath.    [provider]  apixaban (ELIQUIS STARTER PACK) 5 MG  TABS tablet Take 1 tablet (5 mg total) by mouth 2 (two) times daily. 11/15/16   Martinique, Betty G, MD  cetirizine (ZYRTEC) 10 MG tablet Take 10 mg by mouth daily.    [provider]  fluticasone (FLONASE) 50 MCG/ACT nasal spray Place into both nostrils daily.    [provider]  timolol (TIMOPTIC) 0.5 % ophthalmic solution Place 1 drop into both eyes every morning.    [provider]    Family History Family History  Problem Relation Age of Onset  . Pulmonary embolism Mother        Last year  . Mental retardation Mother        PTSD and fibromyalgia  . Heart disease Mother        CHF  . Heart disease Father 75       CAD  . Hyperlipidemia Father   . Diabetes Paternal Grandmother     Social History Social History  Substance Use Topics  . Smoking status: Never Smoker  . Smokeless tobacco: Never Used  . Alcohol use Yes     Allergies   Patient has no known allergies.   Review of Systems Review of Systems   A complete 10 system review of systems was obtained and all systems are negative except as noted in the HPI and PMH.   Physical Exam Updated Vital Signs BP 114/77   Pulse 92   Temp 98.4 F (36.9 C) (Oral)  Resp (!) 26   Ht 5\' 10"  (1.778 m)   Wt (!) 350 lb (158.8 kg)   SpO2 97%   BMI 50.22 kg/m   Physical Exam  Constitutional: She is oriented to person, place, and time. She appears well-developed and well-nourished. No distress.  Obese.   HENT:  Head: Normocephalic and atraumatic.  Mouth/Throat: Oropharynx is clear and moist. No oropharyngeal exudate.  Eyes: Pupils are equal, round, and reactive to light. Conjunctivae and EOM are normal.  Neck: Normal range of motion. Neck supple.  No meningismus.  Cardiovascular: Normal rate, regular rhythm, normal heart sounds and intact distal pulses.   No murmur heard. Pulmonary/Chest: Effort normal and breath sounds normal. No respiratory distress.  Reproducible central chest tenderness.     Abdominal: Soft. There is no tenderness. There is no rebound and no guarding.  Musculoskeletal: Normal range of motion. She exhibits no edema or tenderness.  Neurological: She is alert and oriented to person, place, and time. No cranial nerve deficit. She exhibits normal muscle tone. Coordination normal.   5/5 strength throughout. CN 2-12 intact.Equal grip strength.   Skin: Skin is warm.  Psychiatric: She has a normal mood and affect. Her behavior is normal.  Nursing note and vitals reviewed.    ED Treatments / Results  DIAGNOSTIC STUDIES: Oxygen Saturation is 100% on RA, normal by my interpretation.   COORDINATION OF CARE: 1:08 AM-Discussed next steps with pt which includes pain medication. Pt verbalized understanding and is agreeable with the plan.   Labs (all labs ordered are listed, but only abnormal results are displayed) Labs Reviewed  BASIC METABOLIC PANEL - Abnormal; Notable for the following:       Result Value   Glucose, Bld 106 (*)    All other components within normal limits  CBC - Abnormal; Notable for the following:    Hemoglobin 11.7 (*)    HCT 35.9 (*)    All other components within normal limits  D-DIMER, QUANTITATIVE (NOT AT [facility])  I-STAT TROPONIN, ED    EKG  EKG Interpretation  Date/Time:  Thursday November 25 2016 23:04:19 EDT Ventricular Rate:  98 PR Interval:  158 QRS Duration: 70 QT Interval:  324 QTC Calculation: 413 R Axis:   -14 Text Interpretation:  Normal sinus rhythm Cannot rule out Anterior infarct , age undetermined Abnormal ECG No significant change was found Confirmed by Ezequiel Essex [phone]) on 11/26/2016 12:19:23 AM       Radiology Dg Chest 2 View  Result Date: 11/25/2016 CLINICAL DATA:  Chest pain and shortness of breath EXAM: CHEST  2 VIEW COMPARISON:  Chest radiograph 11/02/2016 and chest CT 11/03/2016 FINDINGS: The heart size and mediastinal contours are within normal limits. Both lungs are clear. The visualized skeletal  structures are unremarkable. IMPRESSION: No active cardiopulmonary disease. Electronically Signed   By: Ulyses Jarred M.D.   On: 11/25/2016 23:49   Ct Angio Chest Pe W And/or Wo Contrast  Result Date: 11/26/2016 CLINICAL DATA:  Pulmonary embolus last month with worsening dyspnea and chest pain today. EXAM: CT ANGIOGRAPHY CHEST WITH CONTRAST TECHNIQUE: Multidetector CT imaging of the chest was performed using the standard protocol during bolus administration of intravenous contrast. Multiplanar CT image reconstructions and MIPs were obtained to evaluate the vascular anatomy. CONTRAST:  100 cc Isovue 370 IV COMPARISON:  11/03/2016 chest CT FINDINGS: Cardiovascular: Heart is normal in size without pericardial effusion. There are tiny subsegmental pulmonary emboli to both lower lobes though less so than on prior exam. Findings  may represent some residual pulmonary emboli from previous implant. No new appearing pulmonary embolus is identified. No heart strain. Mediastinum/Nodes: No enlarged mediastinal, hilar, or axillary lymph nodes. Thyroid gland, trachea, and esophagus demonstrate no significant findings. Lungs/Pleura: Clearing of left effusion. No pneumothorax or pneumonic consolidation Upper Abdomen: No acute abnormality. Musculoskeletal: No chest wall abnormality. No acute or significant osseous findings. Review of the MIP images confirms the above findings. IMPRESSION: 1. Clearing of left pleural effusion with some residual tiny subsegmental pulmonary emboli are noted to both lower lobes, overall improved since previous exam suggesting some interval lysed cysts of pre-existing thrombi. Recanalization may be a source of chest pain. Critical Value/emergent results were called by telephone at the time of interpretation on 11/26/2016 at 2:20 am to Dr. Ezequiel Essex , who verbally acknowledged these results. 2. No acute pulmonary disease. Electronically Signed   By: Ashley Royalty M.D.   On: 11/26/2016 02:21     Procedures Procedures (including critical care time)  Medications Ordered in ED Medications - No data to display   Initial Impression / Assessment and Plan / ED Course  I have reviewed the triage vital signs and the nursing notes.  Pertinent labs & imaging results that were available during my care of the patient were reviewed by me and considered in my medical decision making (see chart for details).     Patient with history of pulmonary embolism last month presenting with worsening shortness of breath over the past several days and intermittent central chest pain today lasting just a few seconds. States compliance with Eliquis.   Patient is in no respiratory distress. Lungs are clear. Chest pain is brief and reproducible. Low suspicion for ACS. EKG is unchanged.  CT shows improvement in pulmonary emboli clot burden. No new clots.  Patient is admitted in stable, ambulatory without desaturation. Continued improving clot burden on CT.  Troponin negative 2. Low suspicion for ACS. Patient stable for discharge. Continue outpatient anticoagulation. Follow-up with PCP. Return precautions discussed.  Final Clinical Impressions(s) / ED Diagnoses   Final diagnoses:  Dyspnea, unspecified type  Other pulmonary embolism without acute cor pulmonale, unspecified chronicity (HCC)    New Prescriptions New Prescriptions   No medications on file  I personally performed the services described in this documentation, which was scribed in my presence. The recorded information has been reviewed and is accurate.     Ezequiel Essex, MD 11/26/16 [phone]

## 2016-11-29 ENCOUNTER — Encounter: Payer: Self-pay | Admitting: Physical Therapy

## 2016-11-29 ENCOUNTER — Ambulatory Visit: Payer: 59 | Admitting: Physical Therapy

## 2016-11-29 DIAGNOSIS — M25551 Pain in right hip: Secondary | ICD-10-CM

## 2016-11-29 DIAGNOSIS — R2689 Other abnormalities of gait and mobility: Secondary | ICD-10-CM

## 2016-11-29 DIAGNOSIS — M6281 Muscle weakness (generalized): Secondary | ICD-10-CM

## 2016-11-29 NOTE — Therapy (Signed)
[facility] [address] [place], [place], [address] Phone: [phone]   Fax:  [phone]  Physical Therapy Treatment  Patient Details  Name: [place] Mcdonald MRN: [id number] Date of Birth: 03-Jul-1975 Referring Provider: Martinique, Betty, MD  Encounter Date: 11/29/2016      PT End of Session - 11/29/16 1408    Visit Number 5   Date for PT Re-Evaluation 01/10/17   PT Start Time 3500   PT Stop Time 1438   PT Time Calculation (min) 40 min   Activity Tolerance Patient limited by pain   Behavior During Therapy [facility] for tasks assessed/performed      Past Medical History:  Diagnosis Date  . Allergy   . Asthma     Past Surgical History:  Procedure Laterality Date  . RECTAL EXAMINATION UNDER ANESTHESIA W/ CYSTOSCOPY    . TONSILLECTOMY      There were no vitals filed for this visit.      Subjective Assessment - 11/29/16 1403    Subjective Pt went to the ER Thursday night due to chest pain and states it was just the embolism that was already there breaking up . States she is supposed to go back to work on Friday but is getting winded from just short phone conversations.  States she didn't do too much after Thursday night.    Pertinent History pulmonary embolism 11/02/16 with reduced lung capacity/endurance   Patient Stated Goals increase endurance, reduce Rt hip pain   Currently in Pain? No/denies   Pain Score 2   on Tylenol   Pain Location Hip   Pain Orientation Right   Pain Descriptors / Indicators Sharp   Pain Type Acute pain   Pain Onset More than a month ago   Pain Frequency Intermittent   Aggravating Factors  hip movements   Pain Relieving Factors Tylenol   Multiple Pain Sites No                         [facility] Adult PT Treatment/Exercise - 11/29/16 0001      Lumbar Exercises: Aerobic   UBE (Upper Arm Bike) L0 fwd/back 3x3     Knee/Hip Exercises: Stretches   Active Hamstring  Stretch Right;Left;20 seconds;2 reps   Other Knee/Hip Stretches flexion on large ball     Knee/Hip Exercises: Aerobic   Nustep L2 x 96min      Knee/Hip Exercises: Standing   Knee Flexion Right;Left;15 reps   Knee Flexion Limitations 3# at ankles at counter    Hip Flexion Stengthening;Left;Right;15 reps   Hip Flexion Limitations 3# ankle weight   Hip ADduction Strengthening;Right;Left;15 reps   Hip ADduction Limitations 3# ankle weight   Hip Extension Stengthening;Right;Left;15 reps   Extension Limitations 3# ankle weight   Other Standing Knee Exercises side stepping, monster walk forward/backwared with red TB around ankles x 3 laps down/back at counter      Knee/Hip Exercises: Seated   Long Arc Quad Right;Left;10 reps;2 sets   [employer] Weight 4 lbs.                  PT Short Term Goals - 11/29/16 1439      PT SHORT TERM GOAL #4   Title walk for 5-10 minutes without fatigue or need to rest   Time 4   Period Weeks   Status On-going           PT Long Term Goals -  11/29/16 1439      PT LONG TERM GOAL #1   Title be independent in advanced HEP   Time 8   Period Weeks   Status On-going     PT LONG TERM GOAL #2   Title reduce FOTO to < or = to 38% limitation   Time 8   Period Weeks   Status On-going     PT LONG TERM GOAL #3   Title get in and out of bed without UE assistance with Rt LE   Time 8   Period Weeks   Status On-going     PT LONG TERM GOAL #4   Title improve cardiovascular endurance to walk for 15-20 minutes without rest   Time 8   Period Weeks   Status On-going     PT LONG TERM GOAL #5   Title report < or = to 3/10 Rt LE pain with standing and walking   Time 8   Period Weeks   Status On-going               Plan - 11/29/16 1408    Clinical Impression Statement Patient is able to tolerate progression with standing exercises today.  Pt continues to get winded and needs breaks but does well breaking exercises into smaller sets  and does more sets.  Pt remains motivated.  Needs skilled PT to continue    PT Treatment/Interventions ADLs/Self Care Home Management;Cryotherapy;Electrical Stimulation;Functional mobility training;Stair training;Ultrasound;Therapeutic activities;Moist Heat;Therapeutic exercise;Neuromuscular re-education;Patient/family education;Passive range of motion;Dry needling;Taping   PT Next Visit Plan endurance and stregth as tolerated, right hip mobility and strength   Consulted and Agree with Plan of Care Patient      Patient will benefit from skilled therapeutic intervention in order to improve the following deficits and impairments:  Abnormal gait, Decreased range of motion, Increased muscle spasms, Decreased endurance, Cardiopulmonary status limiting activity, Decreased activity tolerance, Pain, Decreased strength, Impaired flexibility  Visit Diagnosis: Muscle weakness (generalized)  Pain in right hip  Other abnormalities of gait and mobility     Problem List Patient Active Problem List   Diagnosis Date Noted  . Asthma in adult, unspecified asthma severity, uncomplicated 87/56/4332  . Obesity, Class III, BMI 40-49.9 (morbid obesity) ([place]) 11/04/2016  . Pulmonary embolus ([place])   . Bilateral pulmonary embolism ([place]) 11/03/2016    Zannie Cove, PT 11/29/2016, 2:41 PM  [facility] [address], [place], [place], [address] Phone: [phone]   Fax:  [phone]  Name: Drinda Belgard MRN: [id number] Date of Birth: 1976/02/27

## 2016-12-01 ENCOUNTER — Ambulatory Visit: Payer: 59

## 2016-12-01 DIAGNOSIS — M6281 Muscle weakness (generalized): Secondary | ICD-10-CM

## 2016-12-01 DIAGNOSIS — R2689 Other abnormalities of gait and mobility: Secondary | ICD-10-CM

## 2016-12-01 DIAGNOSIS — M25551 Pain in right hip: Secondary | ICD-10-CM

## 2016-12-01 NOTE — Therapy (Signed)
[facility] [address], [place], [place], [address] Phone: [phone]   Fax:  [phone]  Physical Therapy Treatment  Patient Details  Name: Holly Mcdonald MRN: [id number] Date of Birth: Nov 20, 1975 Referring Provider: Martinique, Betty, MD  Encounter Date: 12/01/2016      PT End of Session - 12/01/16 0934    Visit Number 6   Date for PT Re-Evaluation 01/10/17   PT Start Time 0930   PT Stop Time 7371   PT Time Calculation (min) 44 min   Activity Tolerance Patient limited by pain   Behavior During Therapy [facility] for tasks assessed/performed      Past Medical History:  Diagnosis Date  . Allergy   . Asthma     Past Surgical History:  Procedure Laterality Date  . RECTAL EXAMINATION UNDER ANESTHESIA W/ CYSTOSCOPY    . TONSILLECTOMY      There were no vitals filed for this visit.      Subjective Assessment - 12/01/16 0932    Subjective Pt. to return to work on 7.27.18.  Went to ER on 7.20.18 due to shortness of breath.  Testing checked out fine and d/c from ER.     Patient Stated Goals increase endurance, reduce Rt hip pain   Currently in Pain? No/denies   Pain Score 0-No pain   Multiple Pain Sites No                         [facility] Adult PT Treatment/Exercise - 12/01/16 0945      Lumbar Exercises: Supine   Bridge 15 reps;3 seconds     Knee/Hip Exercises: Stretches   Hip Flexor Stretch 2 reps;Right;30 seconds   Hip Flexor Stretch Limitations in mod thomas position   Other Knee/Hip Stretches Lunge stretch for R hip flexor in doorway 3 x 10"      Knee/Hip Exercises: Aerobic   Nustep L2 x 1min      Knee/Hip Exercises: Standing   Knee Flexion Right;Left;10 reps   Knee Flexion Limitations 4# at ankles    Hip Flexion Stengthening;Left;Right;10 reps;Knee straight   Hip Flexion Limitations 4# at ankles; at counter    Hip Abduction Right;Left;10 reps;Knee straight   Abduction Limitations  4# at ankes, at counter    Hip Extension Stengthening;Right;Left;10 reps   Extension Limitations 4# at ankles; at 45 dg kick back; at counter                 PT Education - 12/01/16 1353    Education provided Yes   Education Details Standing hip extneions, abduction, flexion with pt. to use recently purchased adjustable ankle weights   Person(s) Educated Patient   Methods Explanation;Demonstration;Verbal cues;Handout   Comprehension Verbalized understanding;Returned demonstration;Verbal cues required;Need further instruction          PT Short Term Goals - 11/29/16 1439      PT SHORT TERM GOAL #4   Title walk for 5-10 minutes without fatigue or need to rest   Time 4   Period Weeks   Status On-going           PT Long Term Goals - 11/29/16 1439      PT LONG TERM GOAL #1   Title be independent in advanced HEP   Time 8   Period Weeks   Status On-going     PT LONG TERM GOAL #2   Title reduce FOTO to < or = to 38%  limitation   Time 8   Period Weeks   Status On-going     PT LONG TERM GOAL #3   Title get in and out of bed without UE assistance with Rt LE   Time 8   Period Weeks   Status On-going     PT LONG TERM GOAL #4   Title improve cardiovascular endurance to walk for 15-20 minutes without rest   Time 8   Period Weeks   Status On-going     PT LONG TERM GOAL #5   Title report < or = to 3/10 Rt LE pain with standing and walking   Time 8   Period Weeks   Status On-going               Plan - 12/01/16 1001    Clinical Impression Statement Pt. doing well today noting she checked into ER on 7.20 due to shortness of however checked out fine and d/c.  Pt. performed well with therex advancement today however still requiring frequent rest breaks due to shortness of breath.  Scheduled to return to work on 7.27.18 with pt. expressing doubt whether she will be ready to perform work related tasks.  Tolerated all activities well today and seems to be  progressing well.     PT Treatment/Interventions ADLs/Self Care Home Management;Cryotherapy;Electrical Stimulation;Functional mobility training;Stair training;Ultrasound;Therapeutic activities;Moist Heat;Therapeutic exercise;Neuromuscular re-education;Patient/family education;Passive range of motion;Dry needling;Taping   PT Next Visit Plan endurance and stregth as tolerated, right hip mobility and strength      Patient will benefit from skilled therapeutic intervention in order to improve the following deficits and impairments:  Abnormal gait, Decreased range of motion, Increased muscle spasms, Decreased endurance, Cardiopulmonary status limiting activity, Decreased activity tolerance, Pain, Decreased strength, Impaired flexibility  Visit Diagnosis: Muscle weakness (generalized)  Pain in right hip  Other abnormalities of gait and mobility     Problem List Patient Active Problem List   Diagnosis Date Noted  . Asthma in adult, unspecified asthma severity, uncomplicated 39/07/90  . Obesity, Class III, BMI 40-49.9 (morbid obesity) ([place]) 11/04/2016  . Pulmonary embolus ([place])   . Bilateral pulmonary embolism ([place]) 11/03/2016    Bess Harvest, PTA 12/01/16 1:58 PM  [facility] [address], [place], [place], [address] Phone: [phone]   Fax:  [phone]  Name: Holly Mcdonald MRN: [id number] Date of Birth: 1975/05/28

## 2016-12-03 ENCOUNTER — Telehealth: Payer: Self-pay | Admitting: Family Medicine

## 2016-12-03 ENCOUNTER — Encounter: Payer: Self-pay | Admitting: Family Medicine

## 2016-12-03 NOTE — Telephone Encounter (Signed)
° °  Danielle with [employer] call to request notes from pt visit on 11/15/16 they are needing this information before end of day on 12/06/16   Fax  [place] number [phone]

## 2016-12-06 ENCOUNTER — Ambulatory Visit: Payer: 59 | Admitting: Physical Therapy

## 2016-12-06 ENCOUNTER — Encounter: Payer: Self-pay | Admitting: Physical Therapy

## 2016-12-06 DIAGNOSIS — R2689 Other abnormalities of gait and mobility: Secondary | ICD-10-CM

## 2016-12-06 DIAGNOSIS — M6281 Muscle weakness (generalized): Secondary | ICD-10-CM | POA: Diagnosis not present

## 2016-12-06 DIAGNOSIS — M25551 Pain in right hip: Secondary | ICD-10-CM

## 2016-12-06 NOTE — Telephone Encounter (Signed)
Patient aware office notes have been faxed.

## 2016-12-06 NOTE — Therapy (Signed)
[facility] [address], [place], [place], [address] Phone: [phone]   Fax:  [phone]  Physical Therapy Treatment  Patient Details  Name: Holly Mcdonald MRN: [id number] Date of Birth: [birth year] Referring Provider: Martinique, Betty, MD  Encounter Date: 12/06/2016      PT End of Session - 12/06/16 0935    Visit Number 7   Date for PT Re-Evaluation 01/10/17   PT Start Time 0930   PT Stop Time 1015   PT Time Calculation (min) 45 min   Activity Tolerance Patient limited by pain   Behavior During Therapy [facility] for tasks assessed/performed      Past Medical History:  Diagnosis Date  . Allergy   . Asthma     Past Surgical History:  Procedure Laterality Date  . RECTAL EXAMINATION UNDER ANESTHESIA W/ CYSTOSCOPY    . TONSILLECTOMY      There were no vitals filed for this visit.      Subjective Assessment - 12/06/16 0936    Subjective I went to [employer] to walk this weekend for my exercise. My lungs feel sore this AM.    Pertinent History pulmonary embolism 11/02/16 with reduced lung capacity/endurance. Pt attempted work last week. She only made 2 calls before going home.    Currently in Pain? Yes   Pain Score 2    Pain Location Chest  lungs   Pain Orientation Mid   Pain Descriptors / Indicators Sore   Aggravating Factors  Constant   Pain Relieving Factors meds   Multiple Pain Sites No                         [facility] Adult PT Treatment/Exercise - 12/06/16 0001      Lumbar Exercises: Supine   Bridge --  2 x15 in semireclined with ball squeeze, no lift     Knee/Hip Exercises: Aerobic   Nustep L2 x 96mn   PTA monitored thoughout     Knee/Hip Exercises: Standing   Hip Flexion Stengthening;Both;Knee bent   Hip Flexion Limitations 4# 2 x10   Hip Abduction Stengthening;Both;Knee straight   Abduction Limitations 4# 2 x10    Hip Extension Stengthening;Both;Knee straight    Extension Limitations  2x10   Other Standing Knee Exercises walking around clinic: good pace made 2:40                  PT Short Term Goals - 12/06/16 0949      PT SHORT TERM GOAL #4   Title walk for 5-10 minutes without fatigue or need to rest   Time 4   Period Weeks   Status Partially Met  Pt feels she is at the beginning of this range of 5 min           PT Long Term Goals - 11/29/16 1439      PT LONG TERM GOAL #1   Title be independent in advanced HEP   Time 8   Period Weeks   Status On-going     PT LONG TERM GOAL #2   Title reduce FOTO to < or = to 38% limitation   Time 8   Period Weeks   Status On-going     PT LONG TERM GOAL #3   Title get in and out of bed without UE assistance with Rt LE   Time 8   Period Weeks   Status On-going     PT LONG  TERM GOAL #4   Title improve cardiovascular endurance to walk for 15-20 minutes without rest   Time 8   Period Weeks   Status On-going     PT LONG TERM GOAL #5   Title report < or = to 3/10 Rt LE pain with standing and walking   Time 8   Period Weeks   Status On-going               Plan - 12/06/16 0935    Clinical Impression Statement Pt feels at this time she is able to walk 5 minutes without the need for sitting rest break or significnat fatigue. She reports she gets tired/fatigued but as long as she walks slow she does ok.  pt was bale to increase her overall work today by adding a second set of all her exercises. This was challenging for pt.    Rehab Potential Good   PT Frequency 2x / week   PT Duration 8 weeks   PT Treatment/Interventions ADLs/Self Care Home Management;Cryotherapy;Electrical Stimulation;Functional mobility training;Stair training;Ultrasound;Therapeutic activities;Moist Heat;Therapeutic exercise;Neuromuscular re-education;Patient/family education;Passive range of motion;Dry needling;Taping   PT Next Visit Plan endurance and stregth as tolerated, right hip mobility and strength    Consulted and Agree with Plan of Care Patient      Patient will benefit from skilled therapeutic intervention in order to improve the following deficits and impairments:  Abnormal gait, Decreased range of motion, Increased muscle spasms, Decreased endurance, Cardiopulmonary status limiting activity, Decreased activity tolerance, Pain, Decreased strength, Impaired flexibility  Visit Diagnosis: Muscle weakness (generalized)  Pain in right hip  Other abnormalities of gait and mobility     Problem List Patient Active Problem List   Diagnosis Date Noted  . Asthma in adult, unspecified asthma severity, uncomplicated 51/70/0174  . Obesity, Class III, BMI 40-49.9 (morbid obesity) ([place]) 11/04/2016  . Pulmonary embolus ([place])   . Bilateral pulmonary embolism ([place]) 11/03/2016    Wilkin Lippy, PTA 12/06/2016, 2:07 PM  [facility] [address], [place], [place], [address] Phone: [phone]   Fax:  [phone]  Name: Holly Mcdonald MRN: [id number] Date of Birth: 05-30-1975

## 2016-12-06 NOTE — Telephone Encounter (Signed)
Office notes faxed 

## 2016-12-06 NOTE — Progress Notes (Signed)
HPI:   Holly Mcdonald is a 41 y.o. female, who is here today to follow on recent OV.   She was seen on 11/15/16, when we followed on recent PE, hospitalized from 11/02/16 to 11/04/16. Because exertional dyspnea and feeling "axausted", she requested FMLA for short disability.  She is on Eliquis 5 mg bid. She is tolerating medication well, denies gross hematuria, blood in the stool, gum/nose bleeding, or increased bruising.  She went back to work x 2 days ,reduced hours and states that she had to go home after 30 min. she states that she cannot talk on the phone, which she is what her job entails, for more than 30 minutes. After prolonged talking she starts with severe chest pain and difficulty breathing. Pain is on anterior chest, L>R, not radiated, severe and not associated with palpitations or diaphoresis.  She denies orthopnea, PND, or worsening lower extremities edema.  She was also c/o severe anterior right hip and thigh pain, which she attributed to DVT. PT was recommended for strengthening. Hip pain has improved with PT, ST having pain with certain activities like flexion, still having some limitation of ROM. She denies edema, erythema, or any deformity/ skin changes.   Since her last OV she has been in the ER, 11/26/16, c/o non radiated chest pain and dyspnea. O2 sat 97%.  Lab Results  Component Value Date   WBC 8.0 11/25/2016   HGB 11.7 (L) 11/25/2016   HCT 35.9 (L) 11/25/2016   MCV 82.7 11/25/2016   PLT 287 11/25/2016   EKG unchanged when compared with prior ones.  Chest CTA 11/26/2016:  1. Clearing of left pleural effusion with some residual tiny subsegmental pulmonary emboli are noted to both lower lobes, overall improved since previous exam suggesting some interval lysed cysts of pre-existing thrombi.  2. No acute pulmonary disease.  Asthma: Currently she is on Albuterol 2 puffs every 4-5 hours for dyspnea and wheezing. She wonders if she needs to  see a pulmonologist because history of "bronchitis." In the past she was on Advair and it helped with symptoms. She denies history of tobacco use.  She states that last time she used her Albuterol inh was yesterday, she forgot to use it this morning and feels like she needs it now.  Review of Systems  Constitutional: Positive for fatigue. Negative for appetite change, chills, diaphoresis and fever.  HENT: Negative for mouth sores, nosebleeds and trouble swallowing.   Eyes: Negative for redness and visual disturbance.  Respiratory: Positive for shortness of breath and wheezing. Negative for cough.   Cardiovascular: Positive for chest pain. Negative for palpitations and leg swelling.  Gastrointestinal: Negative for abdominal pain, nausea and vomiting.       Negative for changes in bowel habits.  Genitourinary: Negative for decreased urine volume and hematuria.  Musculoskeletal: Positive for arthralgias and gait problem. Negative for joint swelling.  Skin: Negative for color change, rash and wound.  Allergic/Immunologic: Positive for environmental allergies.  Neurological: Negative for syncope, weakness and headaches.  Hematological: Negative for adenopathy. Does not bruise/bleed easily.  Psychiatric/Behavioral: Negative for confusion. The patient is nervous/anxious.       Current Outpatient Prescriptions on File Prior to Visit  Medication Sig Dispense Refill  . acetaminophen (TYLENOL) 650 MG CR tablet Take 650 mg by mouth every 8 (eight) hours as needed for pain.    Marland Kitchen apixaban (ELIQUIS STARTER PACK) 5 MG TABS tablet Take 1 tablet (5 mg total) by mouth 2 (  two) times daily. 60 tablet 2  . cetirizine (ZYRTEC) 10 MG tablet Take 10 mg by mouth daily.    . fluticasone (FLONASE) 50 MCG/ACT nasal spray Place into both nostrils daily.    Marland Kitchen oxyCODONE (OXY IR/ROXICODONE) 5 MG immediate release tablet Take 5 mg by mouth every 4 (four) hours as needed for severe pain.    Marland Kitchen timolol (TIMOPTIC) 0.5 %  ophthalmic solution Place 1 drop into both eyes every morning.     No current facility-administered medications on file prior to visit.      Past Medical History:  Diagnosis Date  . Allergy   . Asthma    No Known Allergies  Social History   Social History  . Marital status: Divorced    Spouse name: N/A  . Number of children: N/A  . Years of education: N/A   Social History Main Topics  . Smoking status: Never Smoker  . Smokeless tobacco: Never Used  . Alcohol use Yes  . Drug use: No  . Sexual activity: Not Asked   Other Topics Concern  . None   Social History Narrative  . None    Vitals:   12/07/16 1103  BP: 118/80  Pulse: 100  Resp: 12  O2 sat at RA 98% Body mass index is 50.29 kg/m.   Physical Exam  Nursing note and vitals reviewed. Constitutional: She is oriented to person, place, and time. She appears well-developed. No distress.  HENT:  Head: Atraumatic.  Mouth/Throat: Oropharynx is clear and moist and mucous membranes are normal.  Eyes: Pupils are equal, round, and reactive to light. Conjunctivae and EOM are normal.  Cardiovascular: Normal rate and regular rhythm.   No murmur heard. Pulses:      Dorsalis pedis pulses are 2+ on the right side, and 2+ on the left side.  Respiratory: Effort normal and breath sounds normal. No respiratory distress. She exhibits tenderness (Left).  Shallow breathing due to chest wall pain.  GI: Soft. She exhibits no mass. There is no hepatomegaly. There is no tenderness.  Musculoskeletal: She exhibits edema (Trace pitting LE edema, bilateral).       Right hip: She exhibits decreased range of motion (Mild to moderate limitation of flexion) and tenderness (with ROM).       Lumbar back: She exhibits no tenderness and no bony tenderness.  Lymphadenopathy:    She has no cervical adenopathy.  Neurological: She is alert and oriented to person, place, and time. She has normal strength. Coordination normal.  Stable gait with no  assistance needed.  Skin: Skin is warm. No rash noted. No erythema.  Psychiatric: Her mood appears anxious.  Well groomed, good eye contact.     ASSESSMENT AND PLAN:   Holly Mcdonald was seen today for follow-up.  Diagnoses and all orders for this visit:  Dyspnea, unspecified type  Plan discussed possible etiologies, chest wall pain could give her the perception of SOB. Also asthma, deconditioning, obesity, and PE among some.  Asthma in adult, unspecified asthma severity, uncomplicated  Today no wheezing or other abnormality appreciated on auscultation. We discussed some side effects of albuterol inhaler. I don't think pulmonology consultation is necessary at this point but certainly needs to be considered if symptoms are persistent despite treatment. She agrees on trying Advair 250-50 mch bid. Instructed about warning signs. Follow-up in 2 weeks.  -     Fluticasone-Salmeterol (ADVAIR DISKUS) 250-50 MCG/DOSE AEPB; Inhale 1 puff into the lungs 2 (two) times daily. -  albuterol (PROVENTIL HFA;VENTOLIN HFA) 108 (90 Base) MCG/ACT inhaler; Inhale 1-2 puffs into the lungs every 6 (six) hours as needed for wheezing or shortness of breath.  Chest pain, unspecified type  Thrombolysis can certainly cause pain but her CP seems more chest wall, musculoskeletal type of pain. Chest CT and EKG done recently dodn't suggest serious condition. Instructed to avoid shallow breathing to prevent formation of atelectasis. She was also instructed about warning signs.   Bilateral pulmonary embolism ([place])  I was planning on continuing Eliquis until 02/02/17, 3-4 months treatment since this is her first thrombotic even. She is concerned about coagulation disorders, she mentions her FHx of CVD, would like to continue Eliquis until she is sure she does not have a risk of having another episode. She had work-up durign hospitalization, otherwise negative except for minimal elevation of lupus  anticoagulant (DRVVT). She was on OCP, which was discontinued.   Wt loss may help with prevention. I tried to reassure her in regard to "coagulation" disorder but still not satisfied with work-up done so far. Hematology referral placed.  -     Ambulatory referral to Hematology  Right hip pain  She denies prior history of hip pain. ? OA, tendinitis, s/p DVT. Improving. May consider ortho referral next OV if not greatly improved.    FMLA will be filled ou for 2 weeks, starting 12/06/16.     Rana Hochstein G. Martinique, MD  [facility]. [place] office.

## 2016-12-06 NOTE — Telephone Encounter (Signed)
Pt calling to check the status of paperwork.

## 2016-12-07 ENCOUNTER — Ambulatory Visit (INDEPENDENT_AMBULATORY_CARE_PROVIDER_SITE_OTHER): Payer: 59 | Admitting: Family Medicine

## 2016-12-07 ENCOUNTER — Encounter: Payer: Self-pay | Admitting: Family Medicine

## 2016-12-07 VITALS — BP 118/80 | HR 100 | Resp 12 | Ht 70.0 in | Wt 350.5 lb

## 2016-12-07 DIAGNOSIS — J45909 Unspecified asthma, uncomplicated: Secondary | ICD-10-CM

## 2016-12-07 DIAGNOSIS — R079 Chest pain, unspecified: Secondary | ICD-10-CM

## 2016-12-07 DIAGNOSIS — R0600 Dyspnea, unspecified: Secondary | ICD-10-CM

## 2016-12-07 DIAGNOSIS — I2699 Other pulmonary embolism without acute cor pulmonale: Secondary | ICD-10-CM | POA: Diagnosis not present

## 2016-12-07 DIAGNOSIS — M25551 Pain in right hip: Secondary | ICD-10-CM | POA: Diagnosis not present

## 2016-12-07 MED ORDER — ALBUTEROL SULFATE HFA 108 (90 BASE) MCG/ACT IN AERS: 1.0000 | INHALATION_SPRAY | Freq: Four times a day (QID) | RESPIRATORY_TRACT | 2 refills | Status: DC | PRN

## 2016-12-07 MED ORDER — FLUTICASONE-SALMETEROL 250-50 MCG/DOSE IN AEPB: 1.0000 | INHALATION_SPRAY | Freq: Two times a day (BID) | RESPIRATORY_TRACT | 2 refills | Status: DC

## 2016-12-07 NOTE — Patient Instructions (Addendum)
A few things to remember from today's visit:   Bilateral pulmonary embolism ([place]) - Plan: Ambulatory referral to Hematology  Asthma in adult, unspecified asthma severity, uncomplicated - Plan: Fluticasone-Salmeterol (ADVAIR DISKUS) 250-50 MCG/DOSE AEPB, albuterol (PROVENTIL HFA;VENTOLIN HFA) 108 (90 Base) MCG/ACT inhaler  Chest pain, unspecified type  Dyspnea, unspecified type  FMLA will be filled out from 12/06/16 to 2 weeks from now. Advair added today.    Please be sure medication list is accurate. If a new problem present, please set up appointment sooner than planned today.

## 2016-12-08 ENCOUNTER — Telehealth: Payer: Self-pay | Admitting: Family Medicine

## 2016-12-08 NOTE — Telephone Encounter (Deleted)
Holly Mcdonald can be reached at [phone]

## 2016-12-08 NOTE — Telephone Encounter (Signed)
Please advise on the objective findings that prevent patient from those sedentary demands & on work restrictions.  I think the FMLA paperwork is in your sign bin.   Thanks!

## 2016-12-08 NOTE — Telephone Encounter (Addendum)
Tabitha with [employer] is calling needing a copy of patients last office visit and if she had any dx testing done she needs a copy of the report along with the current treatment plan. Patient has sedentary job demands and [employer] needs to know what objective findings are there to prevent the patient from those sedentary demands. Would also like to know what the doctor has planned for the patient regarding work restrictions.   Lawerance Bach can be reached at [phone].

## 2016-12-09 ENCOUNTER — Telehealth: Payer: Self-pay | Admitting: Pulmonary Disease

## 2016-12-09 ENCOUNTER — Ambulatory Visit: Payer: 59 | Attending: Family Medicine | Admitting: Physical Therapy

## 2016-12-09 DIAGNOSIS — M25551 Pain in right hip: Secondary | ICD-10-CM | POA: Diagnosis present

## 2016-12-09 DIAGNOSIS — M6281 Muscle weakness (generalized): Secondary | ICD-10-CM | POA: Diagnosis not present

## 2016-12-09 DIAGNOSIS — R2689 Other abnormalities of gait and mobility: Secondary | ICD-10-CM

## 2016-12-09 NOTE — Telephone Encounter (Signed)
Rec'd faxed disability forms - fwd to [employer] - pt not being seen until 12/10/16 -pr

## 2016-12-09 NOTE — Telephone Encounter (Signed)
There are no objective findings that prevent Holly Mcdonald to do sedentary work. I recommended starting working a few hours per week but according to pt, she will not get paid, so I agreed with 2 more weeks of FMLA leave. FMLA filled out. Plan: Referral to hematology placed, f/u in 2 weeks,and hopefully she can start working at least part time.  Thanks, BJ

## 2016-12-09 NOTE — Therapy (Signed)
[facility] [address], [place], [place], [address] Phone: [phone]   Fax:  [phone]  Physical Therapy Treatment  Patient Details  Name: Holly Mcdonald MRN: [id number] Date of Birth: 21-Aug-1975 Referring Provider: Martinique, Betty, MD  Encounter Date: 12/09/2016      PT End of Session - 12/09/16 0836    Visit Number 8   Date for PT Re-Evaluation 01/10/17   PT Start Time 0809   PT Stop Time 1751   PT Time Calculation (min) 38 min   Activity Tolerance Patient tolerated treatment well      Past Medical History:  Diagnosis Date  . Allergy   . Asthma     Past Surgical History:  Procedure Laterality Date  . RECTAL EXAMINATION UNDER ANESTHESIA W/ CYSTOSCOPY    . TONSILLECTOMY      There were no vitals filed for this visit.      Subjective Assessment - 12/09/16 0812    Subjective Patient arrives 9 min late.  Reports she did not take her pain medicine this morning as she usually does.  States she has her "usual" right hip pain and pain in her chest.  I go to the pulmonologist tomorrow.  I go back to work in 2 more weeks.     Currently in Pain? Yes   Pain Score 2    Pain Location Hip   Pain Type Acute pain   Aggravating Factors  constant   Pain Relieving Factors medicine            [facility] PT Assessment - 12/09/16 0001      6 Minute Walk- Baseline   6 Minute Walk- Baseline yes     6 minute walk test results    Endurance additional comments 1040 feet  2 seated rest breaks  first break at 3:30 min                     [facility] Adult PT Treatment/Exercise - 12/09/16 0001      Lumbar Exercises: Seated   Other Seated Lumbar Exercises foam roll push downs for abdominal activation 10x 5 sec hold   Other Seated Lumbar Exercises red plyo ball hip to hip, shoulder to hip bilaterally 10x each     Knee/Hip Exercises: Stretches   Other Knee/Hip Stretches psoas doorway stretch 3x5  right/left     Knee/Hip Exercises: Aerobic   Nustep L2 x 23mn      Knee/Hip Exercises: Standing   Hip Flexion Stengthening;Both;Knee bent   Hip Flexion Limitations 4# 2 x10   Hip Abduction Stengthening;Right;Left;15 reps   Abduction Limitations red band   Hip Extension Stengthening;Right;Left;15 reps   Extension Limitations red band   Other Standing Knee Exercises walking laps in gym                   PT Short Term Goals - 12/09/16 0954      PT SHORT TERM GOAL #1   Title be independent in initial HEP   Status Achieved     PT SHORT TERM GOAL #2   Title improve Rt LE strength to get in/out of her car without UE assitance   Status Achieved     PT SHORT TERM GOAL #3   Title report < or = to 5/10 Rt LE pain with standing and walking   Status Achieved     PT SHORT TERM GOAL #4   Title walk for 5-10 minutes without fatigue  or need to rest   Time 4   Period Weeks   Status Partially Met           PT Long Term Goals - 12/09/16 0954      PT LONG TERM GOAL #1   Title be independent in advanced HEP   Time 8   Period Weeks   Status On-going     PT LONG TERM GOAL #2   Title reduce FOTO to < or = to 38% limitation   Time 8   Period Weeks   Status On-going     PT LONG TERM GOAL #3   Title get in and out of bed without UE assistance with Rt LE   Time 8   Period Weeks   Status On-going     PT LONG TERM GOAL #4   Title improve cardiovascular endurance to walk for 15-20 minutes without rest   Time 8   Period Weeks   Status On-going     PT LONG TERM GOAL #5   Title report < or = to 3/10 Rt LE pain with standing and walking   Time 8   Period Weeks   Status On-going     PT LONG TERM GOAL #6   Title demonstrate >or = to 4/5 Rt hip strength and full A/ROM to improve functional mobility   Time 8   Period Weeks   Status On-going     PT LONG TERM GOAL #7   Title demonstrate symmetry with gait on level surface   Time 8   Period Weeks   Status On-going                Plan - 12/09/16 2458    Clinical Impression Statement The patient continue to report shortness of breath/fatigue with low level aerobic exercise.  She reports 4/10 pain in her right hip after treatment session.  Minimal chest discomfort.  Mild to moderate shortness of breath but able to carry on some conversation and recovers fairly quickly with sitting.  She was able to complete her personal goal of increased speed on Nu-Step.     Rehab Potential Good   PT Frequency 2x / week   PT Duration 8 weeks   PT Treatment/Interventions ADLs/Self Care Home Management;Cryotherapy;Electrical Stimulation;Functional mobility training;Stair training;Ultrasound;Therapeutic activities;Moist Heat;Therapeutic exercise;Neuromuscular re-education;Patient/family education;Passive range of motion;Dry needling;Taping   PT Next Visit Plan endurance and stregth as tolerated, right hip mobility and strength;  see how appt with pulmonologist went      Patient will benefit from skilled therapeutic intervention in order to improve the following deficits and impairments:  Abnormal gait, Decreased range of motion, Increased muscle spasms, Decreased endurance, Cardiopulmonary status limiting activity, Decreased activity tolerance, Pain, Decreased strength, Impaired flexibility  Visit Diagnosis: Muscle weakness (generalized)  Pain in right hip  Other abnormalities of gait and mobility     Problem List Patient Active Problem List   Diagnosis Date Noted  . Asthma in adult, unspecified asthma severity, uncomplicated 09/98/3382  . Obesity, Class III, BMI 40-49.9 (morbid obesity) ([place]) 11/04/2016  . Pulmonary embolus ([place])   . Bilateral pulmonary embolism ([place]) 11/03/2016   [name], PT 12/09/16 9:56 AM Phone: [phone] Fax: [phone]  [name] 12/09/2016, 9:56 AM  [name] [address] [place], [place],  [address] Phone: [phone]   Fax:  [phone]  Name: Holly Mcdonald MRN: [id number] Date of Birth: 12-Jun-1975

## 2016-12-10 ENCOUNTER — Encounter: Payer: Self-pay | Admitting: Pulmonary Disease

## 2016-12-10 ENCOUNTER — Ambulatory Visit (INDEPENDENT_AMBULATORY_CARE_PROVIDER_SITE_OTHER): Payer: 59 | Admitting: Pulmonary Disease

## 2016-12-10 VITALS — BP 122/84 | HR 91 | Ht 70.0 in | Wt 349.4 lb

## 2016-12-10 DIAGNOSIS — J45909 Unspecified asthma, uncomplicated: Secondary | ICD-10-CM

## 2016-12-10 DIAGNOSIS — I2699 Other pulmonary embolism without acute cor pulmonale: Secondary | ICD-10-CM | POA: Diagnosis not present

## 2016-12-10 NOTE — Assessment & Plan Note (Signed)
Lung function appears normal Take albuterol for wheezing only. Advair can be continued for now and tapered off if you have no persistent symptoms of asthma

## 2016-12-10 NOTE — Patient Instructions (Signed)
Lung function appears normal Take albuterol for wheezing only. Advair can be continued for now and tapered off if you have no persistent symptoms of asthma  Stay on eliquis - duration unclear, hematology consult pending

## 2016-12-10 NOTE — Telephone Encounter (Signed)
FMLA paperwork & copy of last office visit faxed to [facility].

## 2016-12-10 NOTE — Progress Notes (Signed)
Subjective:    Patient ID: Holly Mcdonald, female    DOB: Nov 30, 1975, 41 y.o.   MRN: [id number]  HPI   Chief Complaint  Patient presents with  . Pulm Consult    Has a history of a PE and DVT (diagnosed last June). Currently has some chest pain in the middle of her chest as well as some SOB.    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 She was hospitalized 6/26 for chest pain and dyspnea. CAD injury of the chest showed acute segmental pulmonary emboli and both lower lobes without any CT evidence of RV strain. A small left pleural effusion was noted. RV/LV ratio was 0.8 Venous Doppler was negative for DVT. She had no clear risk factors except for [place] the beach about 4 hour car ride. Her mother had a history of pulmonary embolism at age 32. She was treated initially with IV heparin and discharged on eliquis. She reports that since then substernal chest pain has persisted although decreased from 10-4 she describes this as a dull achy pain that is unrelated to exertion and to body position.Not relieved with Tylenol. She also reports intermittent right upper quadrant sharp shooting pain. She reports shortness of breath at rest and with activity continuously since the original episode which also reports occasional right hip pain  She denies taking oral contraceptives her recent surgery. She had another ER visit on 7/19 -d-dimer was negative CT injury and was repeated and this showed decreased pulmonary emboli , overall improved from prior exam. No cause of her persistent chest pain was identified   she also reports a history of asthma and allergies and has never had primary function testing. She's been using albuterol more frequently since the last month she was started on Advair by her PCP   CT angiogram of the chest showed acute segmental pulmonary emboli in bilateral lower lobes,  no CT evidence of right heart strain. Doppler lower extremity was negative for DVT.  Echo - nml LV fn Hypercoagulable workup neg her mother had PE 1 year ago D- dimer neg 11/25/16  Past Medical History:  Diagnosis Date  . Allergy   . Asthma    Past Surgical History:  Procedure Laterality Date  . RECTAL EXAMINATION UNDER ANESTHESIA W/ CYSTOSCOPY    . TONSILLECTOMY     No Known Allergies   Social History   Social History  . Marital status: Divorced    Spouse name: N/A  . Number of children: N/A  . Years of education: N/A   Occupational History  . Not on file.   Social History Main Topics  . Smoking status: Never Smoker  . Smokeless tobacco: Never Used  . Alcohol use Yes  . Drug use: No  . Sexual activity: Not on file   Other Topics Concern  . Not on file   Social History Narrative  . No narrative on file     Family History  Problem Relation Age of Onset  . Pulmonary embolism Mother        Last year  . Mental retardation Mother        PTSD and fibromyalgia  . Heart disease Mother        CHF  . Heart disease Father 53       CAD  . Hyperlipidemia Father   . Diabetes Paternal Grandmother       Review of Systems  Positive  for shortness of breath at rest and with activity, chest pain is about, regular heartbeats, sneezing, right hip pain  Constitutional: negative for anorexia, fevers and sweats  Eyes: negative for irritation, redness and visual disturbance  Ears, nose, mouth, throat, and face: negative for earaches, epistaxis, nasal congestion and sore throat  Respiratory: negative for cough,  sputum and wheezing  Cardiovascular: negative for  lower extremity edema, orthopnea, palpitations and syncope  Gastrointestinal: negative for abdominal pain, constipation, diarrhea, melena, nausea and vomiting  Genitourinary:negative for dysuria, frequency and hematuria  Hematologic/lymphatic: negative for bleeding, easy bruising and lymphadenopathy    Musculoskeletal:negative for arthralgias, muscle weakness and stiff joints  Neurological: negative for coordination problems, gait problems, headaches and weakness  Endocrine: negative for diabetic symptoms including polydipsia, polyuria and weight loss     Objective:   Physical Exam  Gen. Pleasant, obese, in no distress, normal affect ENT - no lesions, no post nasal drip, class 2-3 airway Neck: No JVD, no thyromegaly, no carotid bruits Lungs: no use of accessory muscles, no dullness to percussion, decreased without rales or rhonchi  Cardiovascular: Rhythm regular, heart sounds  normal, no murmurs or gallops, no peripheral edema Abdomen: soft and non-tender, no hepatosplenomegaly, BS normal. Musculoskeletal: No deformities, no cyanosis or clubbing, tender right lower chest wall Neuro:  alert, non focal, no tremors       Assessment & Plan:

## 2016-12-10 NOTE — Progress Notes (Signed)
   Subjective:    Patient ID: Holly Mcdonald, female    DOB: [birth year], 41 y.o.   MRN: [id number]  HPI    Review of Systems  Constitutional: Negative for fever and unexpected weight change.  HENT: Positive for sneezing. Negative for congestion, dental problem, ear pain, nosebleeds, postnasal drip, rhinorrhea, sinus pressure, sore throat and trouble swallowing.   Eyes: Negative for redness and itching.  Respiratory: Positive for chest tightness and shortness of breath. Negative for cough and wheezing.   Cardiovascular: Positive for chest pain. Negative for palpitations and leg swelling.  Gastrointestinal: Negative for nausea and vomiting.  Genitourinary: Negative for dysuria.  Musculoskeletal: Positive for joint swelling.  Skin: Negative for rash.  Neurological: Negative for headaches.  Hematological: Does not bruise/bleed easily.  Psychiatric/Behavioral: Negative for dysphoric mood. The patient is not nervous/anxious.        Objective:   Physical Exam        Assessment & Plan:

## 2016-12-10 NOTE — Assessment & Plan Note (Addendum)
No clear risk factors other than obesity Stay on eliquis - duration unclear, hematology consult pending -would suggest to keep her on anticoagulation for at least a year and then reassess based on risk versus benefit and weight loss  Persistent chest pain and shortness of breath are out of proportion to clot burden. Clearly she has significant anxiety surrounding this life changing episode

## 2016-12-13 ENCOUNTER — Ambulatory Visit: Payer: 59 | Admitting: Physical Therapy

## 2016-12-13 ENCOUNTER — Encounter: Payer: Self-pay | Admitting: Physical Therapy

## 2016-12-13 DIAGNOSIS — M6281 Muscle weakness (generalized): Secondary | ICD-10-CM

## 2016-12-13 DIAGNOSIS — M25551 Pain in right hip: Secondary | ICD-10-CM

## 2016-12-13 DIAGNOSIS — R2689 Other abnormalities of gait and mobility: Secondary | ICD-10-CM

## 2016-12-13 NOTE — Therapy (Signed)
[facility] [address] [place], [place], [address] Phone: [phone]   Fax:  [phone]  Physical Therapy Treatment  Patient Details  Name: Holly Mcdonald MRN: [id number] Date of Birth: 10/07/1975 Referring Provider: Martinique, Betty, MD  Encounter Date: 12/13/2016      PT End of Session - 12/13/16 0759    Visit Number 9   Date for PT Re-Evaluation 01/10/17   PT Start Time 0758   PT Stop Time 0845   PT Time Calculation (min) 47 min   Activity Tolerance Patient tolerated treatment well   Behavior During Therapy [facility] for tasks assessed/performed      Past Medical History:  Diagnosis Date  . Allergy   . Asthma     Past Surgical History:  Procedure Laterality Date  . RECTAL EXAMINATION UNDER ANESTHESIA W/ CYSTOSCOPY    . TONSILLECTOMY      There were no vitals filed for this visit.      Subjective Assessment - 12/13/16 0800    Subjective Saw pulmonologist. Passed all tests and chest pain is a normal part of her recovery. I want to walk 5 min without stopping by the end of the week.    Pertinent History pulmonary embolism 11/02/16 with reduced lung capacity/endurance. Pt attempted work last week. She only made 2 calls before going home.    Currently in Pain? Yes   Pain Score 2    Pain Location --  chest, denies hip pain   Pain Descriptors / Indicators Dull   Multiple Pain Sites No                         [facility] Adult PT Treatment/Exercise - 12/13/16 0001      Lumbar Exercises: Aerobic   UBE (Upper Arm Bike) L1 3x3, posture focus/respiratory focus     Knee/Hip Exercises: Stretches   Other Knee/Hip Stretches psoas doorway stretch 3x5 right/left     Knee/Hip Exercises: Aerobic   Nustep L3 x 10 min     Knee/Hip Exercises: Standing   Hip Flexion Stengthening;Both;2 sets;15 reps;Knee straight   Hip Flexion Limitations 4#   Forward Step Up --  8 inch box bil 30 sec each    Other Standing Knee Exercises walking laps in gym   4:30 min before sitting, pt able to talk post walk     Knee/Hip Exercises: Seated   Long Arc Quad Strengthening;Both;2 sets;10 reps   Long Arc Quad Weight 4 lbs.   Knee/Hip Flexion Walking 30 feet 4x with high knee marchinc   Sit to Sand --  10x 2 with red band horzontal abd/ shld. No UE                  PT Short Term Goals - 12/13/16 0830      PT SHORT TERM GOAL #4   Title walk for 5-10 minutes without fatigue or need to rest   Time 4   Period Weeks   Status Partially Met  4:30 today in clinic           PT Long Term Goals - 12/13/16 0831      PT LONG TERM GOAL #3   Title get in and out of bed without UE assistance with Rt LE   Time 8   Period Weeks   Status On-going     PT LONG TERM GOAL #5   Title report < or = to 3/10 Rt LE  pain with standing and walking   Time 8   Period Weeks   Status On-going               Plan - 12/13/16 0800    Clinical Impression Statement Pt demonstrated the ability to walk 4:30 today before needing to stop. Added reps to hip strengthening today which pt did without and negative consequence. pt was able to carry on a conversation throughout the session   Rehab Potential Good   PT Frequency 2x / week   PT Duration 8 weeks   PT Treatment/Interventions ADLs/Self Care Home Management;Cryotherapy;Electrical Stimulation;Functional mobility training;Stair training;Ultrasound;Therapeutic activities;Moist Heat;Therapeutic exercise;Neuromuscular re-education;Patient/family education;Passive range of motion;Dry needling;Taping   PT Next Visit Plan endurance and stregth as tolerated, right hip mobility and strength;   Consulted and Agree with Plan of Care --      Patient will benefit from skilled therapeutic intervention in order to improve the following deficits and impairments:  Abnormal gait, Decreased range of motion, Increased muscle spasms, Decreased endurance,  Cardiopulmonary status limiting activity, Decreased activity tolerance, Pain, Decreased strength, Impaired flexibility  Visit Diagnosis: Muscle weakness (generalized)  Pain in right hip  Other abnormalities of gait and mobility     Problem List Patient Active Problem List   Diagnosis Date Noted  . Asthma in adult, unspecified asthma severity, uncomplicated 32/04/2481  . Obesity, Class III, BMI 40-49.9 (morbid obesity) ([place]) 11/04/2016  . Pulmonary embolus ([place])   . Bilateral pulmonary embolism ([place]) 11/03/2016    Grizelda Piscopo, PTA 12/13/2016, 8:45 AM  [facility] [address], [place], [place], [address] Phone: [phone]   Fax:  [phone]  Name: Tenzin Pavon MRN: [id number] Date of Birth: 05/02/1976

## 2016-12-15 ENCOUNTER — Encounter: Payer: Self-pay | Admitting: Physical Therapy

## 2016-12-15 ENCOUNTER — Encounter: Payer: Self-pay | Admitting: Family Medicine

## 2016-12-15 ENCOUNTER — Telehealth: Payer: Self-pay | Admitting: Hematology

## 2016-12-15 ENCOUNTER — Ambulatory Visit: Payer: 59 | Admitting: Physical Therapy

## 2016-12-15 DIAGNOSIS — M25551 Pain in right hip: Secondary | ICD-10-CM

## 2016-12-15 DIAGNOSIS — M6281 Muscle weakness (generalized): Secondary | ICD-10-CM | POA: Diagnosis not present

## 2016-12-15 DIAGNOSIS — R2689 Other abnormalities of gait and mobility: Secondary | ICD-10-CM

## 2016-12-15 NOTE — Telephone Encounter (Signed)
Appt has been scheduled for the pt to see Dr. Burr Medico on 8/13 at 11am. Pt aware to arrive 30 minutes early.

## 2016-12-15 NOTE — Therapy (Signed)
[facility] [address], [place], [place], [address] Phone: [phone]   Fax:  [phone]  Physical Therapy Treatment  Patient Details  Name: Holly Mcdonald MRN: [id number] Date of Birth: March 02, 1976 Referring Provider: Martinique, Betty, MD  Encounter Date: 12/15/2016      PT End of Session - 12/15/16 0759    Visit Number 10   Date for PT Re-Evaluation 01/10/17   PT Start Time 0759   PT Stop Time 0842   PT Time Calculation (min) 43 min   Activity Tolerance Patient tolerated treatment well   Behavior During Therapy [facility] for tasks assessed/performed      Past Medical History:  Diagnosis Date  . Allergy   . Asthma     Past Surgical History:  Procedure Laterality Date  . RECTAL EXAMINATION UNDER ANESTHESIA W/ CYSTOSCOPY    . TONSILLECTOMY      There were no vitals filed for this visit.      Subjective Assessment - 12/15/16 0807    Subjective No new complaints this AM. Pain not increasing, in her chest, with increased activity.    Pertinent History pulmonary embolism 11/02/16 with reduced lung capacity/endurance. Pt attempted work last week. She only made 2 calls before going home.    Currently in Pain? Yes   Pain Score 2    Pain Location Chest  and hip 2/10: sore   Aggravating Factors  constant, with increased aerobic activity   Pain Relieving Factors meds, rest   Multiple Pain Sites No            [facility] PT Assessment - 12/15/16 0001      Observation/Other Assessments   Focus on Therapeutic Outcomes (FOTO)  36% limitations                     [facility] Adult PT Treatment/Exercise - 12/15/16 0001      Knee/Hip Exercises: Aerobic   Nustep L3 x 10 min  Pt is monitored for SOB/pain by PTA     Knee/Hip Exercises: Standing   Forward Step Up Both;2 sets;10 reps;Hand Hold: 0;Step Height: 8"  3# shoulder front raise concurrent   Other Standing Knee Exercises walking laps in gym    5:40 min before sitting, pt able to talk post walk   Other Standing Knee Exercises side stepping with red band 30 feet 4x: pt in mini squat     Knee/Hip Exercises: Seated   Long Arc Quad Strengthening;Both;2 sets;10 reps;Weights   Long Arc Quad Weight 5 lbs.                PT Education - 12/15/16 [phone]    Education provided Yes   Education Details Added red band for resistance for home resisted hip abduction   Person(s) Educated Patient   Methods Explanation;Demonstration   Comprehension Verbalized understanding;Returned demonstration          PT Short Term Goals - 12/15/16 0817      PT SHORT TERM GOAL #4   Title walk for 5-10 minutes without fatigue or need to rest   Time 4   Period Weeks   Status Achieved           PT Long Term Goals - 12/15/16 8127      PT LONG TERM GOAL #2   Title reduce FOTO to < or = to 38% limitation   Time 8   Period Weeks   Status --  36%  Plan - 12/15/16 2778    Clinical Impression Statement Pt increased her walking time to 5:40 today before needing to stop. Weights were also increased in some of her strengthening exercises. All short term goals have been met this week.    Rehab Potential Good   PT Frequency 2x / week   PT Duration 8 weeks   PT Treatment/Interventions ADLs/Self Care Home Management;Cryotherapy;Electrical Stimulation;Functional mobility training;Stair training;Ultrasound;Therapeutic activities;Moist Heat;Therapeutic exercise;Neuromuscular re-education;Patient/family education;Passive range of motion;Dry needling;Taping   PT Next Visit Plan endurance and stregth as tolerated, right hip mobility and strength;   Consulted and Agree with Plan of Care Patient      Patient will benefit from skilled therapeutic intervention in order to improve the following deficits and impairments:  Abnormal gait, Decreased range of motion, Increased muscle spasms, Decreased endurance, Cardiopulmonary status  limiting activity, Decreased activity tolerance, Pain, Decreased strength, Impaired flexibility  Visit Diagnosis: Muscle weakness (generalized)  Pain in right hip  Other abnormalities of gait and mobility     Problem List Patient Active Problem List   Diagnosis Date Noted  . Asthma in adult, unspecified asthma severity, uncomplicated 24/23/5361  . Obesity, Class III, BMI 40-49.9 (morbid obesity) ([place]) 11/04/2016  . Pulmonary embolus ([place])   . Bilateral pulmonary embolism ([place]) 11/03/2016    Iran Kievit, PTA 12/15/2016, 8:43 AM  [facility] [address] [place], [place], [address] Phone: [phone]   Fax:  [phone]  Name: Holly Mcdonald MRN: [id number] Date of Birth: 25-Mar-1976

## 2016-12-16 ENCOUNTER — Ambulatory Visit (HOSPITAL_COMMUNITY)
Admission: EM | Admit: 2016-12-16 | Discharge: 2016-12-16 | Disposition: A | Discharge status: Home / Self Care | Payer: 59

## 2016-12-16 ENCOUNTER — Emergency Department (HOSPITAL_COMMUNITY): Payer: 59

## 2016-12-16 ENCOUNTER — Encounter: Payer: Self-pay | Admitting: Pulmonary Disease

## 2016-12-16 ENCOUNTER — Telehealth: Payer: Self-pay

## 2016-12-16 ENCOUNTER — Emergency Department (HOSPITAL_COMMUNITY)
Admission: EM | Admit: 2016-12-16 | Discharge: 2016-12-16 | Disposition: A | Discharge status: Home / Self Care | Payer: 59 | Attending: Emergency Medicine | Admitting: Emergency Medicine

## 2016-12-16 ENCOUNTER — Encounter (HOSPITAL_COMMUNITY): Payer: Self-pay | Admitting: Emergency Medicine

## 2016-12-16 DIAGNOSIS — Z79899 Other long term (current) drug therapy: Secondary | ICD-10-CM | POA: Diagnosis not present

## 2016-12-16 DIAGNOSIS — Z7901 Long term (current) use of anticoagulants: Secondary | ICD-10-CM | POA: Insufficient documentation

## 2016-12-16 DIAGNOSIS — R079 Chest pain, unspecified: Secondary | ICD-10-CM | POA: Insufficient documentation

## 2016-12-16 DIAGNOSIS — R05 Cough: Secondary | ICD-10-CM | POA: Insufficient documentation

## 2016-12-16 DIAGNOSIS — J45909 Unspecified asthma, uncomplicated: Secondary | ICD-10-CM | POA: Insufficient documentation

## 2016-12-16 DIAGNOSIS — R0602 Shortness of breath: Secondary | ICD-10-CM | POA: Diagnosis present

## 2016-12-16 DIAGNOSIS — Z86711 Personal history of pulmonary embolism: Secondary | ICD-10-CM | POA: Diagnosis not present

## 2016-12-16 DIAGNOSIS — M791 Myalgia: Secondary | ICD-10-CM | POA: Insufficient documentation

## 2016-12-16 LAB — COMPREHENSIVE METABOLIC PANEL
ALT: 18 U/L (ref 14–54)
AST: 26 U/L (ref 15–41)
Albumin: 3.7 g/dL (ref 3.5–5.0)
Alkaline Phosphatase: 50 U/L (ref 38–126)
Anion gap: 8 (ref 5–15)
BUN: 9 mg/dL (ref 6–20)
CO2: 23 mmol/L (ref 22–32)
Calcium: 9.3 mg/dL (ref 8.9–10.3)
Chloride: 106 mmol/L (ref 101–111)
Creatinine, Ser: 0.64 mg/dL (ref 0.44–1.00)
GFR calc Af Amer: >60 mL/min (ref >60)
GFR calc non Af Amer: >60 mL/min (ref >60)
Glucose, Bld: 103 mg/dL — ABNORMAL HIGH (ref 65–99)
Potassium: 3.8 mmol/L (ref 3.5–5.1)
Sodium: 137 mmol/L (ref 135–145)
Total Bilirubin: 0.5 mg/dL (ref 0.3–1.2)
Total Protein: 6.9 g/dL (ref 6.5–8.1)

## 2016-12-16 LAB — D-DIMER, QUANTITATIVE: D-Dimer, Quant: <0.27 ug/mL-FEU (ref 0.00–0.50)

## 2016-12-16 LAB — CBC WITH DIFFERENTIAL/PLATELET
BASOS ABS: 0 10*3/uL (ref 0.0–0.1)
Basophils Relative: 1 %
EOS PCT: 1 %
Eosinophils Absolute: 0 10*3/uL (ref 0.0–0.7)
HCT: 34.8 % — ABNORMAL LOW (ref 36.0–46.0)
Hemoglobin: 11.3 g/dL — ABNORMAL LOW (ref 12.0–15.0)
LYMPHS PCT: 38 %
Lymphs Abs: 2.8 10*3/uL (ref 0.7–4.0)
MCH: 26.7 pg (ref 26.0–34.0)
MCHC: 32.5 g/dL (ref 30.0–36.0)
MCV: 82.1 fL (ref 78.0–100.0)
MONO ABS: 0.4 10*3/uL (ref 0.1–1.0)
Monocytes Relative: 6 %
Neutro Abs: 4.1 10*3/uL (ref 1.7–7.7)
Neutrophils Relative %: 54 %
PLATELETS: 265 10*3/uL (ref 150–400)
RBC: 4.24 MIL/uL (ref 3.87–5.11)
RDW: 13.9 % (ref 11.5–15.5)
WBC: 7.4 10*3/uL (ref 4.0–10.5)

## 2016-12-16 LAB — I-STAT BETA HCG BLOOD, ED (MC, WL, AP ONLY): I-stat hCG, quantitative: <5 m[IU]/mL (ref <5)

## 2016-12-16 LAB — LIPASE, BLOOD: Lipase: 29 U/L (ref 11–51)

## 2016-12-16 LAB — I-STAT TROPONIN, ED: Troponin i, poc: 0 ng/mL (ref 0.00–0.08)

## 2016-12-16 MED ORDER — METHYLPREDNISOLONE SODIUM SUCC 125 MG IJ SOLR
125.0000 mg | Freq: Once | INTRAMUSCULAR | Status: AC
  Administered 2016-12-16: 125 mg via INTRAMUSCULAR
  Filled 2016-12-16: qty 2

## 2016-12-16 MED ORDER — ACETAMINOPHEN 325 MG PO TABS
650.0000 mg | ORAL_TABLET | Freq: Once | ORAL | Status: AC
  Administered 2016-12-16: 650 mg via ORAL
  Filled 2016-12-16: qty 2

## 2016-12-16 MED ORDER — PREDNISONE 10 MG (21) PO TBPK: ORAL_TABLET | ORAL | 0 refills | Status: DC

## 2016-12-16 MED ORDER — BENZONATATE 100 MG PO CAPS: 100.0000 mg | ORAL_CAPSULE | Freq: Three times a day (TID) | ORAL | 0 refills | Status: DC

## 2016-12-16 MED ORDER — IBUPROFEN 400 MG PO TABS: 600.0000 mg | ORAL_TABLET | Freq: Once | ORAL | Status: DC

## 2016-12-16 MED ORDER — IPRATROPIUM-ALBUTEROL 0.5-2.5 (3) MG/3ML IN SOLN
3.0000 mL | Freq: Once | RESPIRATORY_TRACT | Status: AC
  Administered 2016-12-16: 3 mL via RESPIRATORY_TRACT
  Filled 2016-12-16: qty 3

## 2016-12-16 NOTE — ED Notes (Signed)
Patient able to ambulate independently  

## 2016-12-16 NOTE — Telephone Encounter (Signed)
Patient called with c/o increased ShOB since last night after cleaning her house. She denies any CP, wheeze or cough. She was able to carry on a full conversation on the phone with no pauses for deep breaths or difficulty speaking. She has not tried using her inhalers, either Advair or Albuterol. Advised pt to use Albuterol HFA, 2 puffs (instructed on use), to wait 15-10mins and if not improving can call office back to schedule acute visit. She voiced understanding and will use inhaler now. Nothing further needed at this time.

## 2016-12-16 NOTE — ED Provider Notes (Signed)
MSE was initiated and I personally evaluated the patient and placed orders (if any) at  5:20 PM on December 16, 2016.   Presents with complaint of shortness of breath worse than normal beginning around 8:30AM this morning. Has tried her albuterol without improvement. Had a PE on 11/02/16. Has been compliant with her Eliquis. Also endorses constant bilateral chest pain, but states this has been constant since her PE and is no different today. States her current pain is much less intense than the pain she experienced with her PE. Current pain is 5/10, "sharp, quick pain that feels like the pain that happens when they said the clots are bursting," nonradiating.   States, "I saw my pulmonologist a week ago, he did lung function testing, and it lied to him and said my lungs are fine." Saw Dr. Elsworth Soho on 8/3. She was cleared from their practice.   Denies fever/chill, cough, N/V/D, dizziness/lightheadedness, hemoptysis, leg pain/swelling, or any other complaints.  Physical Exam  Constitutional: She appears well-developed and well-nourished. No distress.  HENT:  Head: Normocephalic and atraumatic.  Eyes: Conjunctivae are normal.  Neck: Neck supple.  Cardiovascular: Normal rate, regular rhythm, normal heart sounds and intact distal pulses.   Pulmonary/Chest: Breath sounds normal. She has no wheezes. She has no rales. She exhibits tenderness (Across the entire anterior chest).  No increased work of breathing. Patient speaks in full sentences without difficulty.  Abdominal: Soft. There is no tenderness. There is no guarding.  Musculoskeletal: She exhibits no edema.  Lymphadenopathy:    She has no cervical adenopathy.  Neurological: She is alert.  Skin: Skin is warm and dry. Capillary refill takes less than 2 seconds. She is not diaphoretic.  Psychiatric: She has a normal mood and affect. Her behavior is normal.  Nursing note and vitals reviewed.   Patient is nontoxic appearing, afebrile, not tachycardic on  my exam, not tachypneic, not hypotensive, maintains SPO2 of 99% on room air, and is in no apparent distress. Patient was recently reevaluated for similar symptoms on July 20. She had a normal d-dimer at that time. Repeat CT PE study showed no additional clot burden, and actually showed recannulization. Based on her current presentation and negative d-dimer and CT PE study since the diagnosis of her PE, I think another etiology, such as bronchitis, is more likely. I expect the d-dimer to still be negative and thus, I thought it would be a useful study for this patient at this time.   Patient discussed with Shirlyn Goltz, MD.   Vitals:   12/16/16 1658 12/16/16 1659  BP: (!) 128/99   Pulse: 97   Resp: 18   Temp: 98.4 F (36.9 C)   TempSrc: Oral   SpO2: 99%   Weight:  (!) 159.7 kg (352 lb)  Height:  5\' 10"  (1.778 m)     The patient appears stable so that the remainder of the MSE may be completed by another provider.   Lorayne Bender, PA-C 12/16/16 1752    Drenda Freeze, MD 12/16/16 [phone]

## 2016-12-16 NOTE — Discharge Instructions (Signed)
Your lab and imaging results are encouraging. You may have developed a bronchitis causing an asthma exacerbation. Please take the prednisone as prescribed. May continue to use your albuterol inhaler as needed. Continue to take the Zyrtec.  Your symptoms are consistent with a viral illness. Viruses do not require antibiotics. Treatment is symptomatic care and it is important to note that these symptoms may last for 7-14 days.   Hand washing: Wash your hands throughout the day, but especially before and after touching the face, using the restroom, sneezing, coughing, or touching surfaces that have been coughed or sneezed upon. Hydration: Symptoms will be intensified and complicated by dehydration. Dehydration can also extend the duration of symptoms. Drink plenty of fluids and get plenty of rest. You should be drinking at least half a liter of water an hour to stay hydrated. Electrolyte drinks are also encouraged. You should be drinking enough fluids to make your urine light yellow, almost clear. If this is not the case, you are not drinking enough water. Please note that some of the treatments indicated below will not be effective if you are not adequately hydrated. Pain or fever: Tylenol for pain or fever.  Cough: Use the Tessalon for cough.  Congestion: Plain Mucinex may help relieve congestion. Saline sinus rinses and saline nasal sprays may also help relieve congestion. If you do not have heart problems or an allergy to such medications, you may also try phenylephrine or Sudafed. Sore throat: Warm liquids or Chloraseptic spray may help soothe a sore throat. Gargle twice a day with a salt water solution made from a half teaspoon of salt in a cup of warm water.  Follow up: Follow up with a primary care provider, as needed, for any future management of this issue.

## 2016-12-16 NOTE — Telephone Encounter (Signed)
Duration unclear, at least 6-12 months in my opinion, will let hematologist decide Refills can be provided by PCP since she will not be following up with [place]

## 2016-12-16 NOTE — ED Notes (Signed)
Pt states she is experiencing pain to the epigastric area radiating to her right side.  States it was just a quick flare up.  Will add Lipase.

## 2016-12-16 NOTE — ED Provider Notes (Signed)
[facility] DEPT Provider Note   CSN: [id number] Arrival date & time: 12/16/16  1641     History   Chief Complaint Chief Complaint  Patient presents with  . Shortness of Breath  . Generalized Body Aches    HPI Holly Mcdonald is a 41 y.o. female.  HPI   Holly Mcdonald is a 41 y.o. female, with a history of asthma and PE, presenting to the ED with complaint of shortness of breath worse than normal beginning around 8:30AM this morning that she states feels more like pain from an asthma flare than PE shortness of breath. Has tried her albuterol without improvement.  Had a PE on 11/02/16. Has been compliant with her Eliquis. Also endorses constant bilateral chest pain, but states this has been constant since her PE and is no different today. States her current pain is much less intense than the pain she experienced with her PE. Current pain is 5/10, "sharp, quick pain that feels like the pain that happens when they said the clots are bursting," nonradiating. Endorses nonproductive cough.   States, "I saw my pulmonologist a week ago, he did lung function testing, and it lied to him and said my lungs are fine." Saw Dr. Elsworth Soho on 8/3. She was cleared from their practice.   Denies fever/chill, N/V/D, dizziness/lightheadedness, hemoptysis, leg pain/swelling, or any other complaints.   Past Medical History:  Diagnosis Date  . Allergy   . Asthma     Patient Active Problem List   Diagnosis Date Noted  . Asthma in adult, unspecified asthma severity, uncomplicated 27/25/3664  . Obesity, Class III, BMI 40-49.9 (morbid obesity) ([place]) 11/04/2016  . Pulmonary embolus ([place])   . Bilateral pulmonary embolism ([place]) 11/03/2016    Past Surgical History:  Procedure Laterality Date  . RECTAL EXAMINATION UNDER ANESTHESIA W/ CYSTOSCOPY    . TONSILLECTOMY      OB History    No data available       Home Medications    Prior to Admission medications   Medication Sig  Start Date End Date Taking? Authorizing Provider  acetaminophen (TYLENOL) 650 MG CR tablet Take 650 mg by mouth every 8 (eight) hours as needed for pain.   Yes [provider]  albuterol (PROVENTIL HFA;VENTOLIN HFA) 108 (90 Base) MCG/ACT inhaler Inhale 1-2 puffs into the lungs every 6 (six) hours as needed for wheezing or shortness of breath. 12/07/16  Yes Martinique, Holly G, MD  apixaban (ELIQUIS STARTER PACK) 5 MG TABS tablet Take 1 tablet (5 mg total) by mouth 2 (two) times daily. 11/15/16  Yes [place], Holly G, MD  calcium carbonate (TUMS - DOSED IN MG ELEMENTAL CALCIUM) 500 MG chewable tablet Chew 1 tablet by mouth daily.   Yes [provider]  cetirizine (ZYRTEC) 10 MG tablet Take 10 mg by mouth daily.   Yes [provider]  fluticasone (FLONASE) 50 MCG/ACT nasal spray Place into both nostrils daily.   Yes [provider]  Fluticasone-Salmeterol (ADVAIR DISKUS) 250-50 MCG/DOSE AEPB Inhale 1 puff into the lungs 2 (two) times daily. 12/07/16  Yes [place]ique, Holly G, MD  omeprazole (PRILOSEC OTC) 20 MG tablet Take 20 mg by mouth daily.   Yes [provider]  oxyCODONE (OXY IR/ROXICODONE) 5 MG immediate release tablet Take 5 mg by mouth every 4 (four) hours as needed for severe pain.   Yes [provider]  timolol (TIMOPTIC) 0.5 % ophthalmic solution Place 1 drop into both eyes every morning.   Yes  [provider]  predniSONE (STERAPRED UNI-PAK 21 TAB) 10 MG (21) TBPK tablet Take 6 tabs on day 1, 5 tabs on day 2, 4 tabs on day 3, 3 tabs on day 4, 2 tabs on day 5, and 1 tab on day 6. 12/16/16   Holly Mcdonald, Helane Gunther, PA-C    Family History Family History  Problem Relation Age of Onset  . Pulmonary embolism Mother        Last year  . Mental retardation Mother        PTSD and fibromyalgia  . Heart disease Mother        CHF  . Heart disease Father 17       CAD  . Hyperlipidemia Father   . Diabetes Paternal Grandmother     Social History Social  History  Substance Use Topics  . Smoking status: Never Smoker  . Smokeless tobacco: Never Used  . Alcohol use Yes     Allergies   Patient has no known allergies.   Review of Systems Review of Systems  Constitutional: Negative for chills and fever.  Respiratory: Positive for cough and shortness of breath.   Cardiovascular: Positive for chest pain. Negative for palpitations and leg swelling.  Gastrointestinal: Negative for abdominal pain, diarrhea, nausea and vomiting.  Musculoskeletal: Negative for back pain.  All other systems reviewed and are negative.    Physical Exam Updated Vital Signs BP (!) 128/99 (BP Location: Left Arm)   Pulse 97   Temp 98.4 F (36.9 C) (Oral)   Resp 18   Ht 5\' 10"  (1.778 m)   Wt (!) 159.7 kg (352 lb)   LMP 12/02/2016 (Approximate)   SpO2 99%   BMI 50.51 kg/m   Physical Exam  Constitutional: She appears well-developed and well-nourished. No distress.  Morbidly obese, black female  HENT:  Head: Normocephalic and atraumatic.  Eyes: Conjunctivae are normal.  Neck: Neck supple.  Cardiovascular: Normal rate, regular rhythm, normal heart sounds and intact distal pulses.   Pulmonary/Chest: Effort normal and breath sounds normal. No respiratory distress.  Patient shows no increased work of breathing. Speaks in full sentences without difficulty.  Abdominal: There is no guarding.  Musculoskeletal: She exhibits no edema.  Lymphadenopathy:    She has no cervical adenopathy.  Neurological: She is alert.  Skin: Skin is warm and dry. Capillary refill takes less than 2 seconds. She is not diaphoretic.  Psychiatric: She has a normal mood and affect. Her behavior is normal.  Nursing note and vitals reviewed.    ED Treatments / Results  Labs (all labs ordered are listed, but only abnormal results are displayed) Labs Reviewed  CBC WITH DIFFERENTIAL/PLATELET - Abnormal; Notable for the following:       Result Value   Hemoglobin 11.3 (*)    HCT 34.8  (*)    All other components within normal limits  COMPREHENSIVE METABOLIC PANEL - Abnormal; Notable for the following:    Glucose, Bld 103 (*)    All other components within normal limits  D-DIMER, QUANTITATIVE (NOT AT [facility])  LIPASE, BLOOD  I-STAT TROPONIN, ED  I-STAT BETA HCG BLOOD, ED ([facility], [facility], [facility] ONLY)    EKG  EKG Interpretation None       Radiology Dg Chest 2 View  Result Date: 12/16/2016 CLINICAL DATA:  Shortness of breath and chest pain. EXAM: CHEST  2 VIEW COMPARISON:  11/25/2016 FINDINGS: The heart size and mediastinal contours are within normal limits. There is no evidence of pulmonary edema, consolidation,  pneumothorax, nodule or pleural fluid. The visualized skeletal structures are unremarkable. IMPRESSION: No active cardiopulmonary disease. Electronically Signed   By: Aletta Edouard M.D.   On: 12/16/2016 18:02    Procedures Procedures (including critical care time)  Medications Ordered in ED Medications  ipratropium-albuterol (DUONEB) 0.5-2.5 (3) MG/3ML nebulizer solution 3 mL (3 mLs Nebulization Given 12/16/16 2239)  methylPREDNISolone sodium succinate (SOLU-MEDROL) 125 mg/2 mL injection 125 mg (125 mg Intramuscular Given 12/16/16 2236)  acetaminophen (TYLENOL) tablet 650 mg (650 mg Oral Given 12/16/16 2235)     Initial Impression / Assessment and Plan / ED Course  I have reviewed the triage vital signs and the nursing notes.  Pertinent labs & imaging results that were available during my care of the patient were reviewed by me and considered in my medical decision making (see chart for details).  Clinical Course as of Dec 16 2256  Thu Dec 16, 2016  2209 Patient's symptoms have improved from the time the MSE was completed.  [SJ]  2256 Patient voices significant improvement following administration of DuoNeb and Solu-Medrol. States she is ready for discharge.  [SJ]    Clinical Course User Index [SJ] Nana Vastine C, PA-C     Patient presents with a complaint of  shortness of breath and chest pain. Her symptoms significantly improved with treatment here in the ED. Doubt ACS. Troponin negative. D-dimer also negative (patient had a previously negative d-dimer following her PE, which lets [place] use today's d-dimer more effectively). Suspect possible bronchitis. Etiology likely viral. PCP follow-up. The patient was given instructions for home care as well as return precautions. Patient voices understanding of these instructions, accepts the plan, and is comfortable with discharge.  Findings and plan of care discussed with Shirlyn Goltz, MD.   Vitals:   12/16/16 1658 12/16/16 1659 12/16/16 2228  BP: (!) 128/99  115/67  Pulse: 97  77  Resp: 18  18  Temp: 98.4 F (36.9 C)    TempSrc: Oral    SpO2: 99%  97%  Weight:  (!) 159.7 kg (352 lb)   Height:  5\' 10"  (1.778 m)      Final Clinical Impressions(s) / ED Diagnoses   Final diagnoses:  Shortness of breath    New Prescriptions New Prescriptions   PREDNISONE (STERAPRED UNI-PAK 21 TAB) 10 MG (21) TBPK TABLET    Take 6 tabs on day 1, 5 tabs on day 2, 4 tabs on day 3, 3 tabs on day 4, 2 tabs on day 5, and 1 tab on day 6.     Lorayne Bender, PA-C 12/16/16 2300    Drenda Freeze, MD 12/16/16 [phone]

## 2016-12-16 NOTE — Telephone Encounter (Signed)
Holly Mcdonald pt called back stated that she is not feeling any better after she got out of the shower she has pain from her shoulders down to her ribs.  Pt would like to have a call back.

## 2016-12-16 NOTE — Telephone Encounter (Signed)
Spoke with pt and she states that she has used Albuterol HFA with no improvement. She also has now noticed some "achiness" in her shoulders and ribs. Advised pt she can be seen here tomorrow or seek care at [facility]. She would like to go to UC tonight. Nothing further needed at this time.

## 2016-12-16 NOTE — ED Triage Notes (Signed)
Pt to ED with c/o shortness of breath.  St's it started in June when she had a PE but st's it's worse today,.  Pt also c/o mid chest pain

## 2016-12-16 NOTE — Telephone Encounter (Signed)
RA advise to pt email. Thanks.

## 2016-12-16 NOTE — ED Notes (Signed)
Bill oxford, np spoke to patient in intake room.  Patient going to ed per bill oxford, np

## 2016-12-19 ENCOUNTER — Encounter: Payer: Self-pay | Admitting: Pulmonary Disease

## 2016-12-19 DIAGNOSIS — J45909 Unspecified asthma, uncomplicated: Secondary | ICD-10-CM

## 2016-12-20 ENCOUNTER — Ambulatory Visit: Payer: 59 | Admitting: Physical Therapy

## 2016-12-20 ENCOUNTER — Encounter: Payer: Self-pay | Admitting: Physical Therapy

## 2016-12-20 ENCOUNTER — Encounter: Payer: Self-pay | Admitting: Hematology

## 2016-12-20 ENCOUNTER — Ambulatory Visit (HOSPITAL_BASED_OUTPATIENT_CLINIC_OR_DEPARTMENT_OTHER): Payer: 59 | Admitting: Hematology

## 2016-12-20 ENCOUNTER — Ambulatory Visit (HOSPITAL_BASED_OUTPATIENT_CLINIC_OR_DEPARTMENT_OTHER): Payer: 59

## 2016-12-20 ENCOUNTER — Telehealth: Payer: Self-pay | Admitting: Hematology

## 2016-12-20 DIAGNOSIS — I2699 Other pulmonary embolism without acute cor pulmonale: Secondary | ICD-10-CM

## 2016-12-20 DIAGNOSIS — D500 Iron deficiency anemia secondary to blood loss (chronic): Secondary | ICD-10-CM

## 2016-12-20 DIAGNOSIS — R0609 Other forms of dyspnea: Secondary | ICD-10-CM | POA: Diagnosis not present

## 2016-12-20 DIAGNOSIS — R079 Chest pain, unspecified: Secondary | ICD-10-CM | POA: Diagnosis not present

## 2016-12-20 DIAGNOSIS — D509 Iron deficiency anemia, unspecified: Secondary | ICD-10-CM

## 2016-12-20 DIAGNOSIS — R2689 Other abnormalities of gait and mobility: Secondary | ICD-10-CM

## 2016-12-20 DIAGNOSIS — Z7901 Long term (current) use of anticoagulants: Secondary | ICD-10-CM

## 2016-12-20 DIAGNOSIS — M25551 Pain in right hip: Secondary | ICD-10-CM

## 2016-12-20 DIAGNOSIS — F419 Anxiety disorder, unspecified: Secondary | ICD-10-CM | POA: Diagnosis not present

## 2016-12-20 DIAGNOSIS — M6281 Muscle weakness (generalized): Secondary | ICD-10-CM | POA: Diagnosis not present

## 2016-12-20 DIAGNOSIS — E669 Obesity, unspecified: Secondary | ICD-10-CM

## 2016-12-20 DIAGNOSIS — Z8489 Family history of other specified conditions: Secondary | ICD-10-CM

## 2016-12-20 LAB — IRON AND TIBC
%SAT: 7 % — ABNORMAL LOW (ref 21–57)
IRON: 32 ug/dL — AB (ref 41–142)
TIBC: 425 ug/dL (ref 236–444)
UIBC: 394 ug/dL — ABNORMAL HIGH (ref 120–384)

## 2016-12-20 LAB — FERRITIN: FERRITIN: 12 ng/mL (ref 9–269)

## 2016-12-20 NOTE — Therapy (Signed)
[facility] [address] [place], [place], [address] Phone: [phone]   Fax:  [phone]  Physical Therapy Treatment  Patient Details  Name: Holly Mcdonald MRN: [id number] Date of Birth: Jul 31, 1975 Referring Provider: [place], Betty, MD  Encounter Date: 12/20/2016      PT End of Session - 12/20/16 0803    Visit Number 11   Date for PT Re-Evaluation 01/10/17   PT Start Time 0802   PT Stop Time 0840   PT Time Calculation (min) 38 min   Activity Tolerance Treatment limited secondary to medical complications (Comment)  Flare up of asthma and bronchitits.   Behavior During Therapy [facility] for tasks assessed/performed      Past Medical History:  Diagnosis Date  . Allergy   . Asthma     Past Surgical History:  Procedure Laterality Date  . RECTAL EXAMINATION UNDER ANESTHESIA W/ CYSTOSCOPY    . TONSILLECTOMY      There were no vitals filed for this visit.      Subjective Assessment - 12/20/16 0805    Subjective I have bronchitis and my asthma is flared up. MD put me on meds, she has no restrictions and going to see her pulmonologist today.  My hip is better but it is still hard to put lotion on my legs.   Pertinent History pulmonary embolism 11/02/16 with reduced lung capacity/endurance. Pt attempted work last week. She only made 2 calls before going home.    Currently in Pain? --  My chest pain is more of a 3-4/10 this AM due to flare up.    Pain Descriptors / Indicators Sore   Multiple Pain Sites No                         [facility] Adult PT Treatment/Exercise - 12/20/16 0001      Knee/Hip Exercises: Stretches   Active Hamstring Stretch Right;4 reps;20 seconds   Gastroc Stretch Both;3 reps;30 seconds  on slant board   Other Knee/Hip Stretches RT hip external rotation release    Other Knee/Hip Stretches Glute medius stretch; thigh adduction/knee to opposite chest 3x 20 sec  leg  lengthener in between sets     Knee/Hip Exercises: Aerobic   Nustep L1 x 6 min  Modified today due to difficulty breathing                  PT Short Term Goals - 12/15/16 0817      PT SHORT TERM GOAL #4   Title walk for 5-10 minutes without fatigue or need to rest   Time 4   Period Weeks   Status Achieved           PT Long Term Goals - 12/20/16 0809      PT LONG TERM GOAL #3   Title get in and out of bed without UE assistance with Rt LE   Time 8   Period Weeks   Status Achieved     PT LONG TERM GOAL #5   Title report < or = to 3/10 Rt LE pain with standing and walking   Time 8   Period Weeks   Status On-going               Plan - 12/20/16 0816    Clinical Impression Statement Pt had a flare up of bronchitis and asthma over the weekend. She reports she has no restrictions but wanted to take  it easy regarding her cardio exercise today.  Treatment focused primarily on hip AROM as pt would like to make putting lotion on her legs easier.    Rehab Potential Good   PT Frequency 2x / week   PT Duration 8 weeks   PT Treatment/Interventions ADLs/Self Care Home Management;Cryotherapy;Electrical Stimulation;Functional mobility training;Stair training;Ultrasound;Therapeutic activities;Moist Heat;Therapeutic exercise;Neuromuscular re-education;Patient/family education;Passive range of motion;Dry needling;Taping   PT Next Visit Plan endurance and stregth as tolerated, right hip mobility and strength;   Consulted and Agree with Plan of Care Patient      Patient will benefit from skilled therapeutic intervention in order to improve the following deficits and impairments:  Abnormal gait, Decreased range of motion, Increased muscle spasms, Decreased endurance, Cardiopulmonary status limiting activity, Decreased activity tolerance, Pain, Decreased strength, Impaired flexibility  Visit Diagnosis: Muscle weakness (generalized)  Pain in right hip  Other abnormalities  of gait and mobility     Problem List Patient Active Problem List   Diagnosis Date Noted  . Asthma in adult, unspecified asthma severity, uncomplicated 29/47/6546  . Obesity, Class III, BMI 40-49.9 (morbid obesity) ([place]) 11/04/2016  . Pulmonary embolus ([place])   . Bilateral pulmonary embolism ([place]) 11/03/2016    Sabel Hornbeck, PTA 12/20/2016, 8:39 AM  [facility] [address], [place], [place], [address] Phone: [phone]   Fax:  [phone]  Name: Larayne Baxley MRN: [id number] Date of Birth: [birth year]

## 2016-12-20 NOTE — Progress Notes (Signed)
HPI:   Holly Mcdonald is a 41 y.o. female, who is here today to follow on recent OV.   She was seen on 12/07/16. Since her last OV she has been in the ER on 12/16/16 for SOB. Lab Results  Component Value Date   WBC 7.4 12/16/2016   HGB 11.3 (L) 12/16/2016   HCT 34.8 (L) 12/16/2016   MCV 82.1 12/16/2016   PLT 265 12/16/2016   Troponin,lipase,D-Dimer, and beta HCG negative CXR no active cardiopulmonary disease.    Chemistry      Component Value Date/Time   NA 137 12/16/2016 1709   K 3.8 12/16/2016 1709   CL 106 12/16/2016 1709   CO2 23 12/16/2016 1709   BUN 9 12/16/2016 1709   CREATININE 0.64 12/16/2016 1709      Component Value Date/Time   CALCIUM 9.3 12/16/2016 1709   ALKPHOS 50 12/16/2016 1709   AST 26 12/16/2016 1709   ALT 18 12/16/2016 1709   BILITOT 0.5 12/16/2016 1709      FMLA approved for 2 more weeks, she did not feel like she could go back to work due to dyspnea and chest pain. She has been concerned about "coagulation" problems and despite of having otherwise negative work-up during hospitalization.So she was referred to hematologists.  PE: She was evaluated by oncologist/hematologist on 12/20/16, Dr Burr Medico. It was recommended life time anticoagulation and f/u in 4 months.   Asthma: Pulmonologist, Dr Elsworth Soho, on 12/10/16. Lung function normal. Continued Advair and Albuterol inh. No further work-up recommended, she has a f/u appt scheduled.  Today she is c/o pain from neck to knees with light touch. She states that her mother has Hx of fibromyalgia and she has same symptoms. She would like to have test done for Dx and also interested in "starting disability process."  She has not noted joint edema or erythema. Pain is constant, severe. It is exacerbated by any activity and alleviated some by rest. She takes Tylenol but still has some tabs of Oxycodone. Steroid shot she received recently in the ER helped with pain.  Denies depression but  reports anxiety. She denies suicidal thoughts.  She is also requesting referral to cardiologists. She is having intermittent chest pain, at rest and aggravated by any type of activity. Pain is not radiated,pain can last hours. Associated SOB. Denies syncope, palpitations,or diaphoresis. She is concerned because her FHx of CVD: Father CAD at 29 and mother CHF.  No Hx of HTN,DM. + Prediabetes and HLD.  + Heartburn. Worse at night. She has not identified exacerbating or alleviating factors.  Denies abdominal pain, nausea, vomiting, changes in bowel habits, blood in stool or melena.  Vit D deficiency, she is not on Vit D supplementation.   Review of Systems  Constitutional: Positive for fatigue. Negative for activity change, appetite change and fever.  HENT: Negative for mouth sores, nosebleeds and trouble swallowing.   Eyes: Negative for redness and visual disturbance.  Respiratory: Positive for chest tightness and shortness of breath. Negative for cough and wheezing.   Cardiovascular: Positive for chest pain. Negative for palpitations and leg swelling.  Gastrointestinal: Negative for abdominal pain, nausea and vomiting.       Negative for changes in bowel habits.  Endocrine: Negative for cold intolerance and heat intolerance.  Genitourinary: Negative for decreased urine volume, difficulty urinating, dysuria and hematuria.  Musculoskeletal: Positive for arthralgias, back pain and myalgias. Negative for gait problem.  Skin: Negative for pallor and rash.  Allergic/Immunologic: Positive for environmental allergies.  Neurological: Negative for syncope, weakness, numbness and headaches.  Psychiatric/Behavioral: Negative for confusion. The patient is nervous/anxious.       Current Outpatient Prescriptions on File Prior to Visit  Medication Sig Dispense Refill  . acetaminophen (TYLENOL) 650 MG CR tablet Take 650 mg by mouth every 8 (eight) hours as needed for pain.    Marland Kitchen albuterol  (PROVENTIL HFA;VENTOLIN HFA) 108 (90 Base) MCG/ACT inhaler Inhale 1-2 puffs into the lungs every 6 (six) hours as needed for wheezing or shortness of breath. 18 g 2  . apixaban (ELIQUIS STARTER PACK) 5 MG TABS tablet Take 1 tablet (5 mg total) by mouth 2 (two) times daily. 60 tablet 2  . benzonatate (TESSALON) 100 MG capsule Take 1 capsule (100 mg total) by mouth every 8 (eight) hours. 21 capsule 0  . calcium carbonate (TUMS - DOSED IN MG ELEMENTAL CALCIUM) 500 MG chewable tablet Chew 1 tablet by mouth daily as needed.     . cetirizine (ZYRTEC) 10 MG tablet Take 10 mg by mouth daily.    . fluticasone (FLONASE) 50 MCG/ACT nasal spray Place into both nostrils daily.    . Fluticasone-Salmeterol (ADVAIR DISKUS) 250-50 MCG/DOSE AEPB Inhale 1 puff into the lungs 2 (two) times daily. 60 each 2  . oxyCODONE (OXY IR/ROXICODONE) 5 MG immediate release tablet Take 5 mg by mouth every 4 (four) hours as needed for severe pain.    . predniSONE (STERAPRED UNI-PAK 21 TAB) 10 MG (21) TBPK tablet Take 6 tabs on day 1, 5 tabs on day 2, 4 tabs on day 3, 3 tabs on day 4, 2 tabs on day 5, and 1 tab on day 6. 21 tablet 0  . timolol (TIMOPTIC) 0.5 % ophthalmic solution Place 1 drop into both eyes every morning.    . Vitamin D, Ergocalciferol, (DRISDOL) 50000 units CAPS capsule Take 1 capsule by mouth 2 (two) times a week.    Marland Kitchen albuterol (PROVENTIL) (2.5 MG/3ML) 0.083% nebulizer solution Take 3 mLs (2.5 mg total) by nebulization every 6 (six) hours as needed for wheezing or shortness of breath. 360 mL 5   No current facility-administered medications on file prior to visit.      Past Medical History:  Diagnosis Date  . Allergy   . Asthma    Allergies  Allergen Reactions  . Other Nausea And Vomiting    Darvocet N-100    Social History   Social History  . Marital status: Divorced    Spouse name: N/A  . Number of children: N/A  . Years of education: N/A   Social History Main Topics  . Smoking status: Never  Smoker  . Smokeless tobacco: Never Used  . Alcohol use Yes  . Drug use: No  . Sexual activity: Not Asked   Other Topics Concern  . None   Social History Narrative  . None    Vitals:   12/21/16 1058  BP: 120/78  Pulse: 63  Resp: 16  SpO2: 97%   Body mass index is 50.79 kg/m.   Physical Exam  Nursing note and vitals reviewed. Constitutional: She is oriented to person, place, and time. She appears well-developed. No distress.  HENT:  Head: Normocephalic and atraumatic.  Mouth/Throat: Oropharynx is clear and moist and mucous membranes are normal.  Eyes: Pupils are equal, round, and reactive to light. Conjunctivae are normal.  Cardiovascular: Normal rate and regular rhythm.   No murmur heard. Pulses:      Dorsalis  pedis pulses are 2+ on the right side, and 2+ on the left side.  Respiratory: Effort normal and breath sounds normal. No respiratory distress. She exhibits tenderness.  GI: Soft. She exhibits no mass. There is no hepatomegaly. There is no tenderness.  Musculoskeletal: She exhibits edema (Trace pitting LE edema,bilateral.).       Cervical back: She exhibits decreased range of motion. She exhibits no tenderness.       Thoracic back: She exhibits tenderness.       Lumbar back: She exhibits tenderness.  + Trigger points on back, extremities,and chest wall.Bilateral.  No deformities or signs of synovitis appreciated.  Lymphadenopathy:    She has no cervical adenopathy.  Neurological: She is alert and oriented to person, place, and time. She has normal strength. Coordination and gait normal.  Skin: Skin is warm. No rash noted. No erythema.  Psychiatric: Her mood appears anxious.  Well groomed, good eye contact.     ASSESSMENT AND PLAN:   Holly Mcdonald was seen today for follow-up.  Diagnoses and all orders for this visit:  Lab Results  Component Value Date   CRP 0.5 12/21/2016   Lab Results  Component Value Date   ESRSEDRATE 48 (H) 12/21/2016     Gastroesophageal reflux disease, esophagitis presence not specified  GERD precautions recommended. Omeprazole 40 mg for 1-2 months then we can decrease to 20 mg. Some side effects of PPI discussed. F/U in 5 weeks.  -     omeprazole (PRILOSEC) 40 MG capsule; Take 1 capsule (40 mg total) by mouth daily.  Myalgia  Fibromyalgia most likely. After discussion of treatment options and some side effects she agrees with Cymbalta , start at 30 mg daily. Low impact physical activity and good sleep hygiene. Muscle relaxants may also help, side effects discussed. F/U in 5 weeks.  -     DULoxetine (CYMBALTA) 30 MG capsule; Take 1 capsule (30 mg total) by mouth daily. -     methocarbamol (ROBAXIN) 500 MG tablet; Take 1 tablet (500 mg total) by mouth every 8 (eight) hours as needed for muscle spasms. -     Sedimentation rate -     C-reactive protein -     CK  Vitamin D deficiency, unspecified  Further recommendations will be given according to lab results.  -     VITAMIN D 25 Hydroxy (Vit-D Deficiency, Fractures)  Chest pain, unspecified type  We discussed possible etiologies: Musculoskeletal,GI, pulmonary, and less likely cardiac. She is very anxious about possible CAD,cardio referral placed. Instructed about warning signs.  -     Ambulatory referral to Cardiology  Bilateral pulmonary embolism [facility])  She will continue following with hematologists.  Asthma in adult, unspecified asthma severity, uncomplicated  She will continue following with pulmonologist.     I think she can return to work, so instructed to do so. She could start part time for a few days but she does not want part time because she has no vacation time left, so we can consider FLMA with restrictions.      Zitlaly Malson G. [place], MD  [facility]. [place] office.

## 2016-12-20 NOTE — Progress Notes (Signed)
[place]  [phone] Fax:([phone]) [place] consult Note   Patient Care Team: Martinique, Betty G, MD as PCP - General (Family Medicine) 12/20/2016   Referring physician: Martinique, Betty G, MD   CHIEF COMPLAINTS/PURPOSE OF CONSULTATION:  Pulmonary Embolism   HISTORY OF PRESENTING ILLNESS: 12/20/16 Holly Mcdonald 41 y.o. female is here because of her recent pulmonary embolism. She was referred by her primary care physician Dr. Martinique, she presents to my clinic by herself today.   She thought she had brochitis which she has now for her birthday vacation on 6/15th. Then she developed chest pain in the id chest a few days later. It hurt her to move. She went back to work on 6/25th. She went to work the next day and she went to urgernt care and saw an ER doctor. He did a EKG. She went to ED at [facility] and a CT was done. She was on a herpin drip in hospital. Now she is on Eliquis one twice a day. Since hospitalization she still has chest pain. The pain it reduced but increased due to bronchitis. She had steroid shot earlier this month and the pain had stopped from that. She can feel when her clot breaks. The pain is more of a 5/10 than the original 15/10 in June.   She is always SOB. She does not have much of  Cough.   She has bladder spasms and she is prediabetic and suspect glaucoma. Dr. Elsworth Soho tested her lung function which came back clear.   She has had hip pain since May and she has bilateral lower leg swelling that can become painful. She is in PT for her right hip pain. Her mother has had clots 1 year ago. And her father died of a heart attack. She has been overweight for a long time. She has heavy flow regular periods. She stopped taking birth control in 2017.   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  She was evaluate by pulmonologist Dr. Elsworth Soho on 12/10/2016, who pulmonary function test was normal, she did not feel  that this was helpful.    CURRENT THERAPY: Eliquis 5mg  twice daily    MEDICAL HISTORY:  Past Medical History:  Diagnosis Date  . Allergy   . Asthma     SURGICAL HISTORY: Past Surgical History:  Procedure Laterality Date  . RECTAL EXAMINATION UNDER ANESTHESIA W/ CYSTOSCOPY    . TONSILLECTOMY      SOCIAL HISTORY: Social History   Social History  . Marital status: Divorced    Spouse name: N/A  . Number of children: N/A  . Years of education: N/A   Occupational History  . Not on file.   Social History Main Topics  . Smoking status: Never Smoker  . Smokeless tobacco: Never Used  . Alcohol use Yes  . Drug use: No  . Sexual activity: Not on file   Other Topics Concern  . Not on file   Social History Narrative  . No narrative on file    FAMILY HISTORY: Family History  Problem Relation Age of Onset  . Pulmonary embolism Mother        Last year  . Mental retardation Mother        PTSD and fibromyalgia  . Heart disease Mother        CHF  . Heart disease Father 79       CAD  . Hyperlipidemia Father   .  Diabetes Paternal Grandmother     ALLERGIES:  is allergic to other.  MEDICATIONS:  Current Outpatient Prescriptions  Medication Sig Dispense Refill  . acetaminophen (TYLENOL) 650 MG CR tablet Take 650 mg by mouth every 8 (eight) hours as needed for pain.    Marland Kitchen albuterol (PROVENTIL HFA;VENTOLIN HFA) 108 (90 Base) MCG/ACT inhaler Inhale 1-2 puffs into the lungs every 6 (six) hours as needed for wheezing or shortness of breath. 18 g 2  . apixaban (ELIQUIS STARTER PACK) 5 MG TABS tablet Take 1 tablet (5 mg total) by mouth 2 (two) times daily. 60 tablet 2  . benzonatate (TESSALON) 100 MG capsule Take 1 capsule (100 mg total) by mouth every 8 (eight) hours. 21 capsule 0  . calcium carbonate (TUMS - DOSED IN MG ELEMENTAL CALCIUM) 500 MG chewable tablet Chew 1 tablet by mouth daily as needed.     . cetirizine (ZYRTEC) 10 MG tablet Take 10 mg by mouth daily.    .  fluticasone (FLONASE) 50 MCG/ACT nasal spray Place into both nostrils daily.    . Fluticasone-Salmeterol (ADVAIR DISKUS) 250-50 MCG/DOSE AEPB Inhale 1 puff into the lungs 2 (two) times daily. 60 each 2  . omeprazole (PRILOSEC OTC) 20 MG tablet Take 20 mg by mouth daily.    Marland Kitchen oxyCODONE (OXY IR/ROXICODONE) 5 MG immediate release tablet Take 5 mg by mouth every 4 (four) hours as needed for severe pain.    . predniSONE (STERAPRED UNI-PAK 21 TAB) 10 MG (21) TBPK tablet Take 6 tabs on day 1, 5 tabs on day 2, 4 tabs on day 3, 3 tabs on day 4, 2 tabs on day 5, and 1 tab on day 6. 21 tablet 0  . timolol (TIMOPTIC) 0.5 % ophthalmic solution Place 1 drop into both eyes every morning.    . Vitamin D, Ergocalciferol, (DRISDOL) 50000 units CAPS capsule Take 1 capsule by mouth 2 (two) times a week.     No current facility-administered medications for this visit.     REVIEW OF SYSTEMS:   Constitutional: Denies fevers, chills or abnormal night sweats Eyes: Denies blurriness of vision, double vision or watery eyes Ears, nose, mouth, throat, and face: Denies mucositis or sore throat Respiratory: (+) SOB on exesion and talking. (+) chest pain  (+) asthma/bronchitis  Cardiovascular: Denies palpitation, chest discomfort (+) lower extremity swelling Gastrointestinal:  Denies nausea, heartburn or change in bowel habits Skin: Denies abnormal skin rashes Lymphatics: Denies new lymphadenopathy or easy bruising Neurological:Denies numbness, tingling or new weaknesses MAK: (+) right Hip pain (+) aching in shoulder and pain in axillary upon tough  Behavioral/Psych: Mood is stable, no new changes  All other systems were reviewed with the patient and are negative.  PHYSICAL EXAMINATION: ECOG PERFORMANCE STATUS: 1 - Symptomatic but completely ambulatory  Vitals:   12/20/16 1131  BP: 138/72  Pulse: 61  Resp: 17  Temp: 99 F (37.2 C)  SpO2: 100%   Filed Weights   12/20/16 1131  Weight: (!) 350 lb 8 oz (159 kg)      GENERAL:alert, no distress and comfortable SKIN: skin color, texture, turgor are normal, no rashes or significant lesions EYES: normal, conjunctiva are pink and non-injected, sclera clear OROPHARYNX:no exudate, no erythema and lips, buccal mucosa, and tongue normal  NECK: supple, thyroid normal size, non-tender, without nodularity LYMPH:  no palpable lymphadenopathy in the cervical, axillary or inguinal LUNGS: clear to auscultation and percussion with normal breathing effort HEART: regular rate & rhythm and no murmurs and  no lower extremity edema ABDOMEN:abdomen soft, non-tender and normal bowel sounds Musculoskeletal:no cyanosis of digits and no clubbing  PSYCH: alert & oriented x 3 with fluent speech NEURO: no focal motor/sensory deficits  LABORATORY DATA:  I have reviewed the data as listed CBC Latest Ref Rng & Units 12/16/2016 11/25/2016 11/04/2016  WBC 4.0 - 10.5 K/uL 7.4 8.0 7.5  Hemoglobin 12.0 - 15.0 g/dL 11.3(L) 11.7(L) 10.7(L)  Hematocrit 36.0 - 46.0 % 34.8(L) 35.9(L) 34.5(L)  Platelets 150 - 400 K/uL 265 287 227    CMP Latest Ref Rng & Units 12/16/2016 11/25/2016 11/02/2016  Glucose 65 - 99 mg/dL 103(H) 106(H) 108(H)  BUN 6 - 20 mg/dL 9 11 5(L)  Creatinine 0.44 - 1.00 mg/dL 0.64 0.70 0.68  Sodium 135 - 145 mmol/L 137 135 136  Potassium 3.5 - 5.1 mmol/L 3.8 3.7 3.6  Chloride 101 - 111 mmol/L 106 106 103  CO2 22 - 32 mmol/L 23 22 24   Calcium 8.9 - 10.3 mg/dL 9.3 9.3 9.4  Total Protein 6.5 - 8.1 g/dL 6.9 - -  Total Bilirubin 0.3 - 1.2 mg/dL 0.5 - -  Alkaline Phos 38 - 126 U/L 50 - -  AST 15 - 41 U/L 26 - -  ALT 14 - 54 U/L 18 - -     PROCEDURES  ECHO 11/04/16 Study Conclusions - Left ventricle: The cavity size was normal. Wall thickness was   increased in a pattern of mild LVH. Systolic function was normal.   The estimated ejection fraction was in the range of 55% to 60%. - Left atrium: The atrium was mildly dilated. Indications:      Pulmonary embolus  415.19.    RADIOGRAPHIC STUDIES: I have personally reviewed the radiological images as listed and agreed with the findings in the report. Dg Chest 2 View  Result Date: 12/16/2016 CLINICAL DATA:  Shortness of breath and chest pain. EXAM: CHEST  2 VIEW COMPARISON:  11/25/2016 FINDINGS: The heart size and mediastinal contours are within normal limits. There is no evidence of pulmonary edema, consolidation, pneumothorax, nodule or pleural fluid. The visualized skeletal structures are unremarkable. IMPRESSION: No active cardiopulmonary disease. Electronically Signed   By: Aletta Edouard M.D.   On: 12/16/2016 18:02   Dg Chest 2 View  Result Date: 11/25/2016 CLINICAL DATA:  Chest pain and shortness of breath EXAM: CHEST  2 VIEW COMPARISON:  Chest radiograph 11/02/2016 and chest CT 11/03/2016 FINDINGS: The heart size and mediastinal contours are within normal limits. Both lungs are clear. The visualized skeletal structures are unremarkable. IMPRESSION: No active cardiopulmonary disease. Electronically Signed   By: Ulyses Jarred M.D.   On: 11/25/2016 23:49   Ct Angio Chest Pe W And/or Wo Contrast  Result Date: 11/26/2016 CLINICAL DATA:  Pulmonary embolus last month with worsening dyspnea and chest pain today. EXAM: CT ANGIOGRAPHY CHEST WITH CONTRAST TECHNIQUE: Multidetector CT imaging of the chest was performed using the standard protocol during bolus administration of intravenous contrast. Multiplanar CT image reconstructions and MIPs were obtained to evaluate the vascular anatomy. CONTRAST:  100 cc Isovue 370 IV COMPARISON:  11/03/2016 chest CT FINDINGS: Cardiovascular: Heart is normal in size without pericardial effusion. There are tiny subsegmental pulmonary emboli to both lower lobes though less so than on prior exam. Findings may represent some residual pulmonary emboli from previous implant. No new appearing pulmonary embolus is identified. No heart strain. Mediastinum/Nodes: No enlarged mediastinal,  hilar, or axillary lymph nodes. Thyroid gland, trachea, and esophagus demonstrate no significant findings.  Lungs/Pleura: Clearing of left effusion. No pneumothorax or pneumonic consolidation Upper Abdomen: No acute abnormality. Musculoskeletal: No chest wall abnormality. No acute or significant osseous findings. Review of the MIP images confirms the above findings. IMPRESSION: 1. Clearing of left pleural effusion with some residual tiny subsegmental pulmonary emboli are noted to both lower lobes, overall improved since previous exam suggesting some interval lysed cysts of pre-existing thrombi. Recanalization may be a source of chest pain. Critical Value/emergent results were called by telephone at the time of interpretation on 11/26/2016 at 2:20 am to Dr. Ezequiel Essex , who verbally acknowledged these results. 2. No acute pulmonary disease. Electronically Signed   By: Ashley Royalty M.D.   On: 11/26/2016 02:21    ASSESSMENT & PLAN:  Holly Mcdonald is a 41 y.o. female who has a history of seasonal allergies, asthma, bronchitis, prediabetic    1. Unprovoked Pulmonary Embolism in 10/2016 -Admitted to Hospital on 11/02/16 for acute PE, CTA showed acute segmental pulmonary emboli in no lobes bilateral.  -Has been to ED twice for dyspnea in the past 2 month  Duration of anticoagulation Giving her positive family history of thrombosis, her severe obesity, and unprovoked bilateral PE, persistent dyspnea (likely not related to her PE ), I recommend continue Eliquis indefinitely. She had a hypercoagulopathy workup, which was drawn before she started to heparin, and all negative. She is very young, no clinical suspicion for malignancy, I do not think she needs further workup.  Family counseling Another main issue we discussed today included the role of screening other family members for thrombophilia disorder. At present time, I would not recommend testing the patient's family members as it would not  benefit them, giving a negative workup.  Anticoagulation options We discussed about various options of anticoagulation therapies including Xarelto, warfarin, low molecular weight heparin such as Lovenox or newer agents such as Rivaroxaban. Some of the risks and benefits discussed including costs involved, the need for monitoring, risks of life-threatening bleeding/hospitalization, reversibility of each agent in the event of bleeding or overdose, safety profile of each drug and taking into account other social issues such as ease of administration of medications, etc. Ultimately, we have made an informed decision for the patient to continue his treatment with Eliquis.  Other preventive strategy for DVT  I recommend the patient to use elastic compression stockings at 20-30 mmHg to reduce risks of chronic thrombophlebitis Finally, at the end of our consultation today, I reinforced the importance of preventive strategies such as avoiding hormonal supplement, avoiding cigarette smoking, keeping up-to-date with screening programs for early cancer detection, frequent ambulation for long distance travel and aggressive DVT prophylaxis in all surgical settings. I highly suggest she lose weight with diet and exercise.     2. Iron deficient anemia -She has been slightly anemic since she started anticoagulation. This is likely secondary to Eliquis induced menorrhagia. -Iron study today showed low iron and ferritin, consistent with iron deficiency. -I suggest she take oral iron, with Vatamin C to make sure her levels can become normal.  -I suggest colace as she may get constipated on oral iron  3. Anxiety -Will f/u with Dr. Martinique for any medication or other non medical therapies.   4. Chest pain and tenderness, rule ut Fibromyalgia -she has aches and pains upon touch from chest, shoulder and axillary regions  -Her mother has  history of fibromyalgia  -I suggest she follow up to Dr. Martinique and discuss about  this.   5. Dyspnea  -  Likely the combination of bilateral PE, obesity, asthma and anxiety -Her pulmonary function test was unremarkable, she saturation very well on room air -I encouraged her to continue physical therapy, and try to lose weight.   Follow up -Lab today for iron study  -Continue Eliquis indefinitely -I'll see him back in 4 months with lab a few days before for f/u IDA     No problem-specific Assessment & Plan notes found for this encounter.    All questions were answered. The patient knows to call the clinic with any problems, questions or concerns. I spent 40 minutes counseling the patient face to face. The total time spent in the appointment was 45 minutes and more than 50% was on counseling.   This document serves as a record of services personally performed by Truitt Merle, MD. It was created on her behalf by Joslyn Devon, a trained medical scribe. The creation of this record is based on the scribe's personal observations and the provider's statements to them. This document has been checked and approved by the attending provider.     Truitt Merle, MD 12/20/2016  5:23 PM

## 2016-12-20 NOTE — Telephone Encounter (Signed)
Dr. Elsworth Soho, please advise on pt's email below. Thanks.  --------------------------------------------------------------  Hello Dr. Elsworth Soho,  I ended up back in the Er Thursday night and was diagnosed with bronchitis and asthma. This is my third bought of bronchitis for this year. Can I a nebulizer so I can do breathing treatments at home when I get bronchitis and asthma at the same time?

## 2016-12-20 NOTE — Telephone Encounter (Signed)
Spoke with patient regarding upcoming appts. She said she will see it in [place]. Did not want avs or calendar.

## 2016-12-21 ENCOUNTER — Ambulatory Visit (INDEPENDENT_AMBULATORY_CARE_PROVIDER_SITE_OTHER): Payer: 59 | Admitting: Family Medicine

## 2016-12-21 ENCOUNTER — Encounter: Payer: Self-pay | Admitting: Family Medicine

## 2016-12-21 VITALS — BP 120/78 | HR 63 | Resp 16 | Ht 70.0 in | Wt 354.0 lb

## 2016-12-21 DIAGNOSIS — R079 Chest pain, unspecified: Secondary | ICD-10-CM

## 2016-12-21 DIAGNOSIS — I2699 Other pulmonary embolism without acute cor pulmonale: Secondary | ICD-10-CM

## 2016-12-21 DIAGNOSIS — J45909 Unspecified asthma, uncomplicated: Secondary | ICD-10-CM | POA: Diagnosis not present

## 2016-12-21 DIAGNOSIS — M791 Myalgia, unspecified site: Secondary | ICD-10-CM

## 2016-12-21 DIAGNOSIS — K219 Gastro-esophageal reflux disease without esophagitis: Secondary | ICD-10-CM | POA: Diagnosis not present

## 2016-12-21 DIAGNOSIS — E559 Vitamin D deficiency, unspecified: Secondary | ICD-10-CM | POA: Diagnosis not present

## 2016-12-21 LAB — C-REACTIVE PROTEIN: CRP: 0.5 mg/dL (ref 0.5–20.0)

## 2016-12-21 LAB — VITAMIN D 25 HYDROXY (VIT D DEFICIENCY, FRACTURES): VITD: 17.52 ng/mL — ABNORMAL LOW (ref 30.00–100.00)

## 2016-12-21 LAB — SEDIMENTATION RATE: Sed Rate: 48 mm/hr — ABNORMAL HIGH (ref 0–20)

## 2016-12-21 LAB — CK: Total CK: 59 U/L (ref 7–177)

## 2016-12-21 MED ORDER — ALBUTEROL SULFATE (2.5 MG/3ML) 0.083% IN NEBU: 2.5000 mg | INHALATION_SOLUTION | Freq: Four times a day (QID) | RESPIRATORY_TRACT | 5 refills | Status: DC | PRN

## 2016-12-21 MED ORDER — OMEPRAZOLE 40 MG PO CPDR: 40.0000 mg | DELAYED_RELEASE_CAPSULE | Freq: Every day | ORAL | 2 refills | Status: DC

## 2016-12-21 MED ORDER — METHOCARBAMOL 500 MG PO TABS: 500.0000 mg | ORAL_TABLET | Freq: Three times a day (TID) | ORAL | 1 refills | Status: DC | PRN

## 2016-12-21 MED ORDER — DULOXETINE HCL 30 MG PO CPEP: 30.0000 mg | ORAL_CAPSULE | Freq: Every day | ORAL | 1 refills | Status: DC

## 2016-12-21 NOTE — Telephone Encounter (Signed)
Patient returned call, we scheduled follow up appt with RA.  She does not have a DME provider currently, so as long as in her network anyone will be okay.  Pharm for nebulizer meds is [facility] on [address]  CB is [phone].

## 2016-12-21 NOTE — Telephone Encounter (Signed)
Okay to provide prescription for nebulizer 1 Albuterol nebs every 6 hours when necessary for wheezing Please arrange follow-up visit with NP in 3 months

## 2016-12-21 NOTE — Telephone Encounter (Signed)
Responded to patient via email.  Requested a call from patient to let us know if she prefers a specific DME and to schedule her 3 month ROV per RA.  Need to also verify pharmacy for neb meds Will send to [facility] to ensure follow up

## 2016-12-21 NOTE — Patient Instructions (Signed)
A few things to remember from today's visit:   Myalgia - Plan: DULoxetine (CYMBALTA) 30 MG capsule, methocarbamol (ROBAXIN) 500 MG tablet, Sedimentation rate, C-reactive protein, CK  Bilateral pulmonary embolism (HCC)  Gastroesophageal reflux disease, esophagitis presence not specified - Plan: omeprazole (PRILOSEC) 40 MG capsule  Vitamin D deficiency, unspecified - Plan: VITAMIN D 25 Hydroxy (Vit-D Deficiency, Fractures)  Chest pain, unspecified type - Plan: Ambulatory referral to Cardiology  Myofascial Pain Syndrome and Fibromyalgia Myofascial pain syndrome and fibromyalgia are both pain disorders. This pain may be felt mainly in your muscles.  Myofascial pain syndrome: ? Always has trigger points or tender points in the muscle that will cause pain when pressed. The pain may come and go. ? Usually affects your neck, upper back, and shoulder areas. The pain often radiates into your arms and hands.  Fibromyalgia: ? Has muscle pains and tenderness that come and go. ? Is often associated with fatigue and sleep disturbances. ? Has trigger points. ? Tends to be long-lasting (chronic), but is not life-threatening.  Fibromyalgia and myofascial pain are not the same. However, they often occur together. If you have both conditions, each can make the other worse. Both are common and can cause enough pain and fatigue to make day-to-day activities difficult. What are the causes? The exact causes of fibromyalgia and myofascial pain are not known. People with certain gene types may be more likely to develop fibromyalgia. Some factors can be triggers for both conditions, such as:  Spine disorders.  Arthritis.  Severe injury (trauma) and other physical stressors.  Being under a lot of stress.  A medical illness.  What are the signs or symptoms? Fibromyalgia The main symptom of fibromyalgia is widespread pain and tenderness in your muscles. This can vary over time. Pain is sometimes  described as stabbing, shooting, or burning. You may have tingling or numbness, too. You may also have sleep problems and fatigue. You may wake up feeling tired and groggy (fibro fog). Other symptoms may include:  Bowel and bladder problems.  Headaches.  Visual problems.  Problems with odors and noises.  Depression or mood changes.  Painful menstrual periods (dysmenorrhea).  Dry skin or eyes.  Myofascial pain syndrome Symptoms of myofascial pain syndrome include:  Tight, ropy bands of muscle.  Uncomfortable sensations in muscular areas, such as: ? Aching. ? Cramping. ? Burning. ? Numbness. ? Tingling. ? Muscle weakness.  Trouble moving certain muscles freely (range of motion).  How is this diagnosed? There are no specific tests to diagnose fibromyalgia or myofascial pain syndrome. Both can be hard to diagnose because their symptoms are common in many other conditions. Your health care provider may suspect one or both of these conditions based on your symptoms and medical history. Your health care provider will also do a physical exam. The key to diagnosing fibromyalgia is having pain, fatigue, and other symptoms for more than three months that cannot be explained by another condition. The key to diagnosing myofascial pain syndrome is finding trigger points in muscles that are tender and cause pain elsewhere in your body (referred pain). How is this treated? Treating fibromyalgia and myofascial pain often requires a team of health care providers. This usually starts with your primary provider and a physical therapist. You may also find it helpful to work with alternative health care providers, such as massage therapists or acupuncturists. Treatment for fibromyalgia may include medicines. This may include nonsteroidal anti-inflammatory drugs (NSAIDs), along with other medicines. Treatment for myofascial pain may  also include:  NSAIDs.  Cooling and stretching of  muscles.  Trigger point injections.  Sound wave (ultrasound) treatments to stimulate muscles.  Follow these instructions at home:  Take medicines only as directed by your health care provider.  Exercise as directed by your health care provider or physical therapist.  Try to avoid stressful situations.  Practice relaxation techniques to control your stress. You may want to try: ? Biofeedback. ? Visual imagery. ? Hypnosis. ? Muscle relaxation. ? Yoga. ? Meditation.  Talk to your health care provider about alternative treatments, such as acupuncture or massage treatment.  Maintain a healthy lifestyle. This includes eating a healthy diet and getting enough sleep.  Consider joining a support group.  Do not do activities that stress or strain your muscles. That includes repetitive motions and heavy lifting. Where to find more information:  National Fibromyalgia Association: www.fmaware.[place]: www.arthritis.org  American Chronic Pain Association: OEMDeals.dk Contact a health care provider if:  You have new symptoms.  Your symptoms get worse.  You have side effects from your medicines.  You have trouble sleeping.  Your condition is causing depression or anxiety. This information is not intended to replace advice given to you by your health care provider. Make sure you discuss any questions you have with your health care provider. Document Released: 04/26/2005 Document Revised: 10/02/2015 Document Reviewed: 01/30/2014 Elsevier Interactive Patient Education  2018 [employer].   Please be sure medication list is accurate. If a new problem present, please set up appointment sooner than planned today.

## 2016-12-22 ENCOUNTER — Ambulatory Visit: Payer: 59 | Admitting: Physical Therapy

## 2016-12-22 ENCOUNTER — Encounter: Payer: Self-pay | Admitting: Physical Therapy

## 2016-12-22 DIAGNOSIS — R2689 Other abnormalities of gait and mobility: Secondary | ICD-10-CM

## 2016-12-22 DIAGNOSIS — M6281 Muscle weakness (generalized): Secondary | ICD-10-CM

## 2016-12-22 DIAGNOSIS — M25551 Pain in right hip: Secondary | ICD-10-CM

## 2016-12-22 NOTE — Therapy (Signed)
[facility] [address] [place], [place], [address] Phone: [phone]   Fax:  [phone]  Physical Therapy Treatment  Patient Details  Name: Holly Mcdonald MRN: [id number] Date of Birth: 1975/11/12 Referring Provider: Martinique, Betty, MD  Encounter Date: 12/22/2016      PT End of Session - 12/22/16 0756    Visit Number 12   Date for PT Re-Evaluation 01/10/17   PT Start Time 0756   PT Stop Time 0845   PT Time Calculation (min) 49 min   Activity Tolerance Patient tolerated treatment well   Behavior During Therapy [facility] for tasks assessed/performed      Past Medical History:  Diagnosis Date  . Allergy   . Asthma     Past Surgical History:  Procedure Laterality Date  . RECTAL EXAMINATION UNDER ANESTHESIA W/ CYSTOSCOPY    . TONSILLECTOMY      There were no vitals filed for this visit.      Subjective Assessment - 12/22/16 0757    Subjective My MD diagnosed me with fibromyalgia yesterday. I am now on Cymbalta and I want to be referred to a disability MD.    Pertinent History pulmonary embolism 11/02/16 with reduced lung capacity/endurance. Pt attempted work last week. She only made 2 calls before going home.    Currently in Pain? Yes  "I am sore all over."    Pain Score 3    Pain Orientation --  All over   Pain Descriptors / Indicators Sore   Aggravating Factors  Not much   Pain Relieving Factors Meds, rest   Multiple Pain Sites No                         [facility] Adult PT Treatment/Exercise - 12/22/16 0001      Knee/Hip Exercises: Stretches   Gastroc Stretch Both;3 reps;30 seconds  on slant board   Other Knee/Hip Stretches RT hip external rotation release    Other Knee/Hip Stretches Glute medius stretch; thigh adduction/knee to opposite chest 3x 20 sec  leg lengthener in between sets     Knee/Hip Exercises: Aerobic   Nustep L1 x 10 min  review of status     Knee/Hip  Exercises: Standing   Hip Abduction Stengthening;Both;1 set;15 reps;Knee straight   Hip Extension Stengthening;Both;1 set;15 reps;Knee straight   Other Standing Knee Exercises walking laps in gym   6:50 min before sitting, pt able to talk post walk     Knee/Hip Exercises: Seated   Long Arc Quad Strengthening;Both;1 set;15 reps   Long Arc Quad Weight 5 lbs.                  PT Short Term Goals - 12/15/16 0817      PT SHORT TERM GOAL #4   Title walk for 5-10 minutes without fatigue or need to rest   Time 4   Period Weeks   Status Achieved           PT Long Term Goals - 12/20/16 0809      PT LONG TERM GOAL #3   Title get in and out of bed without UE assistance with Rt LE   Time 8   Period Weeks   Status Achieved     PT LONG TERM GOAL #5   Title report < or = to 3/10 Rt LE pain with standing and walking   Time 8   Period Weeks  Status On-going               Plan - 12/22/16 0756    Clinical Impression Statement Pt reports she has been diagnosed as having fibromyalgia. She reports she she is sore all over that the MD believes is related to the fibromyalgia.  Pt walked her longest time in the clinic today achieving 6 min 50 seconds.  We discussed the importance of a life long committment  to exercise and movement. Pt verbally understood.    Rehab Potential Good   PT Frequency 2x / week   PT Duration 8 weeks   PT Treatment/Interventions ADLs/Self Care Home Management;Cryotherapy;Electrical Stimulation;Functional mobility training;Stair training;Ultrasound;Therapeutic activities;Moist Heat;Therapeutic exercise;Neuromuscular re-education;Patient/family education;Passive range of motion;Dry needling;Taping   PT Next Visit Plan endurance and stregth as tolerated, right hip mobility and strength; pt returns to work tomorrow.   Consulted and Agree with Plan of Care Patient      Patient will benefit from skilled therapeutic intervention in order to improve the  following deficits and impairments:  Abnormal gait, Decreased range of motion, Increased muscle spasms, Decreased endurance, Cardiopulmonary status limiting activity, Decreased activity tolerance, Pain, Decreased strength, Impaired flexibility  Visit Diagnosis: Muscle weakness (generalized)  Pain in right hip  Other abnormalities of gait and mobility     Problem List Patient Active Problem List   Diagnosis Date Noted  . GERD (gastroesophageal reflux disease) 12/21/2016  . Vitamin D deficiency, unspecified 12/21/2016  . Iron deficiency anemia due to chronic blood loss 12/20/2016  . Asthma in adult, unspecified asthma severity, uncomplicated 27/25/3664  . Obesity, Class III, BMI 40-49.9 (morbid obesity) ([place]) 11/04/2016  . Pulmonary embolus ([place])   . Bilateral pulmonary embolism ([place]) 11/03/2016    Holly Mcdonald, PTA 12/22/2016, 8:42 AM  [facility] [address] [place], [place], [address] Phone: [phone]   Fax:  [phone]  Name: Holly Mcdonald MRN: [id number] Date of Birth: 06/10/1975

## 2016-12-24 ENCOUNTER — Encounter: Payer: Self-pay | Admitting: Family Medicine

## 2016-12-26 MED ORDER — VITAMIN D (ERGOCALCIFEROL) 1.25 MG (50000 UNIT) PO CAPS: ORAL_CAPSULE | ORAL | 2 refills | Status: DC

## 2016-12-27 ENCOUNTER — Encounter: Payer: Self-pay | Admitting: Physical Therapy

## 2016-12-27 ENCOUNTER — Ambulatory Visit: Payer: 59 | Admitting: Physical Therapy

## 2016-12-27 DIAGNOSIS — M25551 Pain in right hip: Secondary | ICD-10-CM

## 2016-12-27 DIAGNOSIS — M6281 Muscle weakness (generalized): Secondary | ICD-10-CM | POA: Diagnosis not present

## 2016-12-27 DIAGNOSIS — R2689 Other abnormalities of gait and mobility: Secondary | ICD-10-CM

## 2016-12-27 NOTE — Therapy (Signed)
[facility] [address], [place], [place], [address] Phone: [phone]   Fax:  [phone]  Physical Therapy Treatment  Patient Details  Name: Holly Mcdonald MRN: [id number] Date of Birth: 08-Mar-1976 Referring Provider: Martinique, Betty, MD  Encounter Date: 12/27/2016      PT End of Session - 12/27/16 0756    Visit Number 13   Date for PT Re-Evaluation 01/10/17   PT Start Time 0752   PT Stop Time 0843   PT Time Calculation (min) 51 min   Activity Tolerance Patient tolerated treatment well   Behavior During Therapy [facility] for tasks assessed/performed      Past Medical History:  Diagnosis Date  . Allergy   . Asthma     Past Surgical History:  Procedure Laterality Date  . RECTAL EXAMINATION UNDER ANESTHESIA W/ CYSTOSCOPY    . TONSILLECTOMY      There were no vitals filed for this visit.      Subjective Assessment - 12/27/16 0754    Subjective I worked 2 full days last week and did ok, I did not get short of breath until the end of the day. My hips were achey all weekned probaly becasue of the the weather. My sharp chest pains are only 1x a day now.    Pertinent History pulmonary embolism 11/02/16 with reduced lung capacity/endurance. Pt attempted work last week. She only made 2 calls before going home.    Currently in Pain? Yes   Pain Score 2    Pain Location Hip   Pain Orientation Right;Left   Pain Descriptors / Indicators Aching   Aggravating Factors  Just constant, weather   Pain Relieving Factors rest   Multiple Pain Sites No                         [facility] Adult PT Treatment/Exercise - 12/27/16 0001      Lumbar Exercises: Supine   Bridge 20 reps;2 seconds  Used UE wedge so pt did not lay flat     Knee/Hip Exercises: Stretches   Active Hamstring Stretch Both;3 reps;20 seconds   Gastroc Stretch Both;3 reps;30 seconds  on slant board   Other Knee/Hip Stretches RT hip  external rotation release      Knee/Hip Exercises: Aerobic   Nustep L2 x 10 min  PTA present for status update     Knee/Hip Exercises: Standing   Hip Flexion Stengthening;Both;2 sets;15 reps;Knee bent   Hip Flexion Limitations 4#   Hip Abduction Stengthening;Both;2 sets;15 reps;Knee straight   Abduction Limitations 4#   Hip Extension Stengthening;Both;2 sets;15 reps;Knee straight   Other Standing Knee Exercises walking laps in gym   8:50 min before sitting, pt able to talk post walk                  PT Short Term Goals - 12/15/16 0817      PT SHORT TERM GOAL #4   Title walk for 5-10 minutes without fatigue or need to rest   Time 4   Period Weeks   Status Achieved           PT Long Term Goals - 12/27/16 0757      PT LONG TERM GOAL #4   Title improve cardiovascular endurance to walk for 15-20 minutes without rest   Time 8   Period Weeks   Status On-going     PT LONG TERM GOAL #5   Title  report < or = to 3/10 Rt LE pain with standing and walking   Time 8   Period Weeks   Status On-going     PT LONG TERM GOAL #7   Title demonstrate symmetry with gait on level surface   Time 8   Period Weeks   Status On-going               Plan - 12/27/16 0757    Clinical Impression Statement Pt was able to work 2 full days last week with shortness of breath happening more towards the end of the day. She was also able to increase her walking distance to 8:50 today before needing to stop. Pain was not a limiting factor during the session today. Again, PTA encouraged pt to view exercise & movement as a life long committment.    Rehab Potential Good   PT Frequency 2x / week   PT Duration 8 weeks   PT Treatment/Interventions ADLs/Self Care Home Management;Cryotherapy;Electrical Stimulation;Functional mobility training;Stair training;Ultrasound;Therapeutic activities;Moist Heat;Therapeutic exercise;Neuromuscular re-education;Patient/family education;Passive range of  motion;Dry needling;Taping   PT Next Visit Plan endurance and stregth as tolerated, right hip mobility and strength;    Consulted and Agree with Plan of Care --      Patient will benefit from skilled therapeutic intervention in order to improve the following deficits and impairments:  Abnormal gait, Decreased range of motion, Increased muscle spasms, Decreased endurance, Cardiopulmonary status limiting activity, Decreased activity tolerance, Pain, Decreased strength, Impaired flexibility  Visit Diagnosis: Muscle weakness (generalized)  Pain in right hip  Other abnormalities of gait and mobility     Problem List Patient Active Problem List   Diagnosis Date Noted  . GERD (gastroesophageal reflux disease) 12/21/2016  . Vitamin D deficiency, unspecified 12/21/2016  . Iron deficiency anemia due to chronic blood loss 12/20/2016  . Asthma in adult, unspecified asthma severity, uncomplicated 38/88/2800  . Obesity, Class III, BMI 40-49.9 (morbid obesity) ([place]) 11/04/2016  . Pulmonary embolus ([place])   . Bilateral pulmonary embolism ([place]) 11/03/2016    Holly Mcdonald, PTA 12/27/2016, 8:36 AM  [facility] [address] [place], [place], [address] Phone: [phone]   Fax:  [phone]  Name: Holly Mcdonald MRN: [id number] Date of Birth: 01-22-1976

## 2016-12-29 ENCOUNTER — Ambulatory Visit: Payer: 59 | Admitting: Physical Therapy

## 2016-12-29 DIAGNOSIS — M6281 Muscle weakness (generalized): Secondary | ICD-10-CM

## 2016-12-29 DIAGNOSIS — R2689 Other abnormalities of gait and mobility: Secondary | ICD-10-CM

## 2016-12-29 DIAGNOSIS — M25551 Pain in right hip: Secondary | ICD-10-CM

## 2016-12-29 NOTE — Therapy (Signed)
[facility] [address], [place], [place], [address] Phone: [phone]   Fax:  [phone]  Physical Therapy Treatment  Patient Details  Name: Holly Mcdonald MRN: [id number] Date of Birth: [birth year] Referring Provider: Martinique, Betty, MD  Encounter Date: 12/29/2016      PT End of Session - 12/29/16 0841    Visit Number 14   Date for PT Re-Evaluation 01/10/17   PT Start Time 0755   PT Stop Time 0840   PT Time Calculation (min) 45 min   Activity Tolerance Patient tolerated treatment well   Behavior During Therapy [facility] for tasks assessed/performed      Past Medical History:  Diagnosis Date  . Allergy   . Asthma     Past Surgical History:  Procedure Laterality Date  . RECTAL EXAMINATION UNDER ANESTHESIA W/ CYSTOSCOPY    . TONSILLECTOMY      There were no vitals filed for this visit.      Subjective Assessment - 12/29/16 0800    Subjective having usual pain level of 2-3.     Pertinent History pulmonary embolism 11/02/16 with reduced lung capacity/endurance. Pt attempted work last week. She only made 2 calls before going home.    Patient Stated Goals increase endurance, reduce Rt hip pain   Currently in Pain? Yes   Pain Score 3    Pain Location Hip   Pain Orientation Left;Right   Pain Descriptors / Indicators Aching   Pain Type Acute pain   Pain Onset More than a month ago   Pain Frequency Constant   Aggravating Factors  constant, weather   Pain Relieving Factors rest                         [facility] Adult PT Treatment/Exercise - 12/29/16 0801      Knee/Hip Exercises: Aerobic   Nustep 3 laps total increasing by 1 level each lap (starting at L1) x 10:30     Knee/Hip Exercises: Standing   Heel Raises Both;20 reps;1 set   Heel Raises Limitations toe raises x 20   Hip Flexion Stengthening;Both;2 sets;20 reps;Knee straight   Hip Flexion Limitations 4#   Hip Abduction  Stengthening;Both;2 sets;20 reps;Knee straight   Abduction Limitations 4#   Hip Extension Stengthening;Both;2 sets;20 reps;Knee straight   Extension Limitations 4#   Other Standing Knee Exercises walking for endurance: 10:30                PT Education - 12/29/16 0841    Education provided Yes   Education Details possibility of Pulmonary Rehab   Person(s) Educated Patient   Methods Explanation   Comprehension Verbalized understanding          PT Short Term Goals - 12/15/16 0817      PT SHORT TERM GOAL #4   Title walk for 5-10 minutes without fatigue or need to rest   Time 4   Period Weeks   Status Achieved           PT Long Term Goals - 12/27/16 0757      PT LONG TERM GOAL #4   Title improve cardiovascular endurance to walk for 15-20 minutes without rest   Time 8   Period Weeks   Status On-going     PT LONG TERM GOAL #5   Title report < or = to 3/10 Rt LE pain with standing and walking   Time 8   Period Weeks  Status On-going     PT LONG TERM GOAL #7   Title demonstrate symmetry with gait on level surface   Time 8   Period Weeks   Status On-going               Plan - 12/29/16 2423    Clinical Impression Statement Pt able to increase amb time today and tolerated session well needing occasional rest breaks.  Feel pt may benefit from Pulmonary Rehab after discharge from PT, but will monitor for appropriateness and defer to primary PT.     PT Treatment/Interventions ADLs/Self Care Home Management;Cryotherapy;Electrical Stimulation;Functional mobility training;Stair training;Ultrasound;Therapeutic activities;Moist Heat;Therapeutic exercise;Neuromuscular re-education;Patient/family education;Passive range of motion;Dry needling;Taping   PT Next Visit Plan endurance and stregth as tolerated, right hip mobility and strength;    Consulted and Agree with Plan of Care Patient      Patient will benefit from skilled therapeutic intervention in order to  improve the following deficits and impairments:  Abnormal gait, Decreased range of motion, Increased muscle spasms, Decreased endurance, Cardiopulmonary status limiting activity, Decreased activity tolerance, Pain, Decreased strength, Impaired flexibility  Visit Diagnosis: Muscle weakness (generalized)  Pain in right hip  Other abnormalities of gait and mobility     Problem List Patient Active Problem List   Diagnosis Date Noted  . GERD (gastroesophageal reflux disease) 12/21/2016  . Vitamin D deficiency, unspecified 12/21/2016  . Iron deficiency anemia due to chronic blood loss 12/20/2016  . Asthma in adult, unspecified asthma severity, uncomplicated 53/61/4431  . Obesity, Class III, BMI 40-49.9 (morbid obesity) ([place]) 11/04/2016  . Pulmonary embolus ([place])   . Bilateral pulmonary embolism ([place]) 11/03/2016      Laureen Abrahams, PT, DPT 12/29/16 8:43 AM    [facility] [address] [place], [place], [address] Phone: [phone]   Fax:  [phone]  Name: Holly Mcdonald MRN: [id number] Date of Birth: 16-Aug-1975

## 2016-12-30 ENCOUNTER — Telehealth: Payer: Self-pay | Admitting: *Deleted

## 2016-12-30 NOTE — Telephone Encounter (Signed)
-----   Message from Truitt Merle, MD sent at 12/26/2016  1:17 PM EDT ----- Please let pt know her iron study results, which is consistent with iron deficient anemia. I recommend her to take iron pills such as ferrous sulfate 1-2 tab daily or other OTC iron supplement, with orange juice or vitc (to improve absorption). Thanks   Truitt Merle  12/26/2016

## 2016-12-30 NOTE — Telephone Encounter (Signed)
Message left for pt to return call tomorrow for message from Dr Burr Medico.

## 2016-12-31 NOTE — Telephone Encounter (Signed)
Pt returned call & left message with OK to leave VM message on her VM.  Returned call & left message per Dr Ernestina Penna instructions & suggested she call back with any questions.

## 2017-01-03 ENCOUNTER — Encounter: Payer: Self-pay | Admitting: Physical Therapy

## 2017-01-03 ENCOUNTER — Ambulatory Visit: Payer: 59 | Admitting: Physical Therapy

## 2017-01-03 DIAGNOSIS — M6281 Muscle weakness (generalized): Secondary | ICD-10-CM | POA: Diagnosis not present

## 2017-01-03 DIAGNOSIS — M25551 Pain in right hip: Secondary | ICD-10-CM

## 2017-01-03 DIAGNOSIS — R2689 Other abnormalities of gait and mobility: Secondary | ICD-10-CM

## 2017-01-03 NOTE — Therapy (Signed)
[facility] [address] [place], [place], [address] Phone: [phone]   Fax:  [phone]  Physical Therapy Treatment  Patient Details  Name: Holly Mcdonald MRN: [id number] Date of Birth: 21-Jul-1975 Referring Provider: Martinique, Betty, MD  Encounter Date: 01/03/2017      PT End of Session - 01/03/17 0804    Visit Number 15   Date for PT Re-Evaluation 01/10/17   PT Start Time 0802   PT Stop Time 0850   PT Time Calculation (min) 48 min   Activity Tolerance Patient tolerated treatment well   Behavior During Therapy [facility] for tasks assessed/performed      Past Medical History:  Diagnosis Date  . Allergy   . Asthma     Past Surgical History:  Procedure Laterality Date  . RECTAL EXAMINATION UNDER ANESTHESIA W/ CYSTOSCOPY    . TONSILLECTOMY      There were no vitals filed for this visit.      Subjective Assessment - 01/03/17 0802    Subjective I want to put lotion on my feet easier, I am considering pulmonary rehab.   Currently in Pain? Yes   Pain Score 3    Pain Location Hip   Pain Orientation Right   Pain Descriptors / Indicators Dull   Aggravating Factors  Fairly constant   Pain Relieving Factors rest, meds   Multiple Pain Sites No                         [facility] Adult PT Treatment/Exercise - 01/03/17 0001      Knee/Hip Exercises: Stretches   Active Hamstring Stretch Both;2 reps;20 seconds   Gastroc Stretch Both;4 reps;30 seconds  on slant board     Knee/Hip Exercises: Aerobic   Nustep L1-3 10 min total  Achieved 3 laps     Knee/Hip Exercises: Standing   Heel Raises Both;20 reps;1 set   Heel Raises Limitations toe raises x 20   Hip Flexion Stengthening;Both;2 sets;20 reps;Knee straight   Hip Flexion Limitations 4#   Hip Abduction Stengthening;Both;2 sets;20 reps;Knee straight   Abduction Limitations 4#   Hip Extension Stengthening;Both;2 sets;20 reps;Knee straight    Extension Limitations 4#   Other Standing Knee Exercises Endurance walking: total time 11: 45                  PT Short Term Goals - 12/15/16 0817      PT SHORT TERM GOAL #4   Title walk for 5-10 minutes without fatigue or need to rest   Time 4   Period Weeks   Status Achieved           PT Long Term Goals - 01/03/17 0805      PT LONG TERM GOAL #1   Title be independent in advanced HEP   Time 8   Period Weeks   Status On-going     PT LONG TERM GOAL #4   Title improve cardiovascular endurance to walk for 15-20 minutes without rest   Time 8   Period Weeks   Status On-going  10 min     PT LONG TERM GOAL #5   Title report < or = to 3/10 Rt LE pain with standing and walking   Time 8   Period Weeks   Status Partially Met               Plan - 01/03/17 0804    Clinical Impression Statement  Pt making progress towards all long term goals. She can currently walk about 10 min ( maybe a little longer) before needing to sit down and taking a rest.  Overall hip pain is 80% better per pt. Will take FOTO questionaire next visit.    Rehab Potential Good   PT Frequency 2x / week   PT Duration 8 weeks   PT Treatment/Interventions ADLs/Self Care Home Management;Cryotherapy;Electrical Stimulation;Functional mobility training;Stair training;Ultrasound;Therapeutic activities;Moist Heat;Therapeutic exercise;Neuromuscular re-education;Patient/family education;Passive range of motion;Dry needling;Taping   PT Next Visit Plan ERO visit next: FOTO, discuss DC vs continuing on. Pt would like to continue with PT to work on hip more and possibly look into pulmonary rehab. MMT RT hip   Consulted and Agree with Plan of Care Patient      Patient will benefit from skilled therapeutic intervention in order to improve the following deficits and impairments:  Abnormal gait, Decreased range of motion, Increased muscle spasms, Decreased endurance, Cardiopulmonary status limiting  activity, Decreased activity tolerance, Pain, Decreased strength, Impaired flexibility  Visit Diagnosis: Muscle weakness (generalized)  Pain in right hip  Other abnormalities of gait and mobility     Problem List Patient Active Problem List   Diagnosis Date Noted  . GERD (gastroesophageal reflux disease) 12/21/2016  . Vitamin D deficiency, unspecified 12/21/2016  . Iron deficiency anemia due to chronic blood loss 12/20/2016  . Asthma in adult, unspecified asthma severity, uncomplicated 93/73/7496  . Obesity, Class III, BMI 40-49.9 (morbid obesity) ([place]) 11/04/2016  . Pulmonary embolus ([place])   . Bilateral pulmonary embolism ([place]) 11/03/2016    Lecretia Buczek, PTA 01/03/2017, 8:46 AM  [facility] [address], [place], [place], [address] Phone: [phone]   Fax:  [phone]  Name: Holly Mcdonald MRN: [id number] Date of Birth: 05-Mar-1976

## 2017-01-05 ENCOUNTER — Ambulatory Visit: Payer: 59

## 2017-01-05 DIAGNOSIS — M6281 Muscle weakness (generalized): Secondary | ICD-10-CM | POA: Diagnosis not present

## 2017-01-05 DIAGNOSIS — R2689 Other abnormalities of gait and mobility: Secondary | ICD-10-CM

## 2017-01-05 DIAGNOSIS — M25551 Pain in right hip: Secondary | ICD-10-CM

## 2017-01-05 NOTE — Therapy (Signed)
[facility] [address] [place], [place], [address] Phone: [phone]   Fax:  [phone]  Physical Therapy Treatment  Patient Details  Name: Holly Mcdonald MRN: [id number] Date of Birth: [birth year] Referring Provider: Martinique, Betty, MD  Encounter Date: 01/05/2017      PT End of Session - 01/05/17 0846    Visit Number 16   Date for PT Re-Evaluation 02/18/17   PT Start Time 0800   PT Stop Time 0846   PT Time Calculation (min) 46 min   Activity Tolerance Patient tolerated treatment well   Behavior During Therapy [facility] for tasks assessed/performed      Past Medical History:  Diagnosis Date  . Allergy   . Asthma     Past Surgical History:  Procedure Laterality Date  . RECTAL EXAMINATION UNDER ANESTHESIA W/ CYSTOSCOPY    . TONSILLECTOMY      There were no vitals filed for this visit.      Subjective Assessment - 01/05/17 0805    Subjective I am getting better.  I still lack endurance and have some Rt hip pain.   Pertinent History pulmonary embolism 11/02/16 with reduced lung capacity/endurance. Pt attempted work last week. She only made 2 calls before going home.    Currently in Pain? Yes   Pain Score 3    Pain Location Hip   Pain Orientation Right   Pain Descriptors / Indicators Dull   Pain Type Acute pain   Pain Onset More than a month ago   Pain Frequency Constant   Aggravating Factors  constant, sometimes with standing and walking   Pain Relieving Factors rest, meds            [facility] PT Assessment - 01/05/17 0001      Assessment   Medical Diagnosis physical deconditioning, right hip pain     Precautions   Precautions Other (comment)  recent PE     Prior Function   Level of Independence Independent   Vocation Full time employment     Cognition   Overall Cognitive Status Within Functional Limits for tasks assessed     Observation/Other Assessments   Focus on Therapeutic  Outcomes (FOTO)  40% limitation     ROM / Strength   AROM / PROM / Strength Strength     AROM   Overall AROM  Deficits   Overall AROM Comments Rt hip ER- not able to place Rt ankle on Lt knee in sitting for function/self-care     Strength   Overall Strength Deficits   Strength Assessment Site Hip   Right/Left Hip Right   Right Hip Flexion 4/5   Right Hip Extension 4+/5   Right Hip ABduction 4/5     Transfers   Transfers Sit to Stand;Stand to Sit;Sit to Supine   Sit to Stand 7: Independent     6 Minute walk- Post Test   6 Minute Walk Post Test yes   Modified Borg Scale for Dyspnea 3- Moderate shortness of breath or breathing difficulty     6 minute walk test results    Endurance additional comments 1180 ft without rest breaks                     [facility] Adult PT Treatment/Exercise - 01/05/17 0001      Knee/Hip Exercises: Stretches   Active Hamstring Stretch Both;2 reps;20 seconds     Knee/Hip Exercises: Aerobic   Nustep L1-3 10 min total  Achieved 3 laps in 8 minutes, 45 seconds     Knee/Hip Exercises: Standing   Heel Raises Both;20 reps;1 set   Heel Raises Limitations toe raises x 20   Hip Flexion Stengthening;Both;2 sets;20 reps;Knee straight   Hip Flexion Limitations 4#   Hip Abduction Stengthening;Both;2 sets;20 reps;Knee straight   Abduction Limitations 4#   Hip Extension Stengthening;Both;2 sets;20 reps;Knee straight   Extension Limitations 4#   Other Standing Knee Exercises 6 minute walk test performed today: 1080 feet, no rest breaks.  3 on Borg dyspnea scale.                    PT Short Term Goals - 12/15/16 0817      PT SHORT TERM GOAL #4   Title walk for 5-10 minutes without fatigue or need to rest   Time 4   Period Weeks   Status Achieved           PT Long Term Goals - 01/05/17 7829      PT LONG TERM GOAL #1   Title be independent in advanced HEP   Time 6   Period Weeks   Status On-going     PT LONG TERM GOAL #2    Title reduce FOTO to < or = to 38% limitation   Baseline 40% limitation   Time 6   Period Weeks   Status On-going     PT LONG TERM GOAL #3   Title get in and out of bed without UE assistance with Rt LE   Status Achieved     PT LONG TERM GOAL #4   Title improve cardiovascular endurance to walk for 15-20 minutes without rest   Baseline 11 minutes, 45 seconds   Time 6   Period Weeks   Status On-going     PT LONG TERM GOAL #5   Title report < or = to 3/10 Rt LE pain with standing and walking   Baseline up to 5/10 with standing and walking, 2-3/10 on average   Time 6   Period Weeks   Status On-going     PT LONG TERM GOAL #6   Title demonstrate >or = to 4+/5 Rt hip strength and full A/ROM to improve functional mobility   Time 6   Period Weeks   Status Revised     PT LONG TERM GOAL #7   Title walk 1350 feet in 6 minutes with 2 dyspnea Borg scale   Time 6   Period Weeks   Status On-going               Plan - 01/05/17 5621    Clinical Impression Statement Pt reports 80% overall improvement in Rt hip pain with functional tasks.  Pt with improved Rt hip strength since evaluation and deficits do remain.  Pt with overall endurance deficits with fatigue and limited walking distance/time expected for age.  Pt is limited in Rt hip A/ROM and is not able to achieve ER to allow for putting on socks/shoes and performing self-care without substitution . Pt will benefit from skilled PT for Rt hip flexibility, strength and endurance progression.     Rehab Potential Good   PT Frequency 2x / week   PT Duration 6 weeks   PT Treatment/Interventions ADLs/Self Care Home Management;Cryotherapy;Electrical Stimulation;Functional mobility training;Stair training;Ultrasound;Therapeutic activities;Moist Heat;Therapeutic exercise;Neuromuscular re-education;Patient/family education;Passive range of motion;Dry needling;Taping   PT Next Visit Plan Rt hip strength, focus on ER A/ROM, endurance  progression.  Arm bike standing  for endurance gains.   Recommended Other Services initial certification is signed, re-cert sent 3/81/84   Consulted and Agree with Plan of Care Patient      Patient will benefit from skilled therapeutic intervention in order to improve the following deficits and impairments:  Abnormal gait, Decreased range of motion, Increased muscle spasms, Decreased endurance, Cardiopulmonary status limiting activity, Decreased activity tolerance, Pain, Decreased strength, Impaired flexibility  Visit Diagnosis: Muscle weakness (generalized) - Plan: PT plan of care cert/re-cert  Pain in right hip - Plan: PT plan of care cert/re-cert  Other abnormalities of gait and mobility - Plan: PT plan of care cert/re-cert     Problem List Patient Active Problem List   Diagnosis Date Noted  . GERD (gastroesophageal reflux disease) 12/21/2016  . Vitamin D deficiency, unspecified 12/21/2016  . Iron deficiency anemia due to chronic blood loss 12/20/2016  . Asthma in adult, unspecified asthma severity, uncomplicated 03/75/4360  . Obesity, Class III, BMI 40-49.9 (morbid obesity) ([place]) 11/04/2016  . Pulmonary embolus ([place])   . Bilateral pulmonary embolism ([place]) 11/03/2016     Sigurd Sos, PT 01/05/17 8:48 AM  [facility] [address], [place], [place], [address] Phone: [phone]   Fax:  [phone]  Name: Marguerite Barba MRN: [id number] Date of Birth: 10/28/1975

## 2017-01-07 ENCOUNTER — Telehealth: Payer: Self-pay | Admitting: Family Medicine

## 2017-01-07 MED ORDER — FLUTICASONE PROPIONATE 50 MCG/ACT NA SUSP: 1.0000 | Freq: Every day | NASAL | 3 refills | Status: DC

## 2017-01-07 NOTE — Telephone Encounter (Signed)
Patient is calling in for a refill for her fluticasone to be sent to [address].//

## 2017-01-07 NOTE — Telephone Encounter (Signed)
Rx sent in

## 2017-01-13 ENCOUNTER — Encounter: Payer: Self-pay | Admitting: Cardiology

## 2017-01-13 ENCOUNTER — Ambulatory Visit (INDEPENDENT_AMBULATORY_CARE_PROVIDER_SITE_OTHER): Payer: 59 | Admitting: Cardiology

## 2017-01-13 VITALS — BP 100/70 | HR 99 | Ht 70.0 in | Wt 345.0 lb

## 2017-01-13 DIAGNOSIS — R079 Chest pain, unspecified: Secondary | ICD-10-CM

## 2017-01-13 DIAGNOSIS — Z8249 Family history of ischemic heart disease and other diseases of the circulatory system: Secondary | ICD-10-CM | POA: Insufficient documentation

## 2017-01-13 DIAGNOSIS — I2699 Other pulmonary embolism without acute cor pulmonale: Secondary | ICD-10-CM | POA: Diagnosis not present

## 2017-01-13 DIAGNOSIS — M940 Chondrocostal junction syndrome [Tietze]: Secondary | ICD-10-CM | POA: Insufficient documentation

## 2017-01-13 DIAGNOSIS — R0609 Other forms of dyspnea: Secondary | ICD-10-CM

## 2017-01-13 DIAGNOSIS — R0600 Dyspnea, unspecified: Secondary | ICD-10-CM

## 2017-01-13 DIAGNOSIS — E66813 Obesity, class 3: Secondary | ICD-10-CM

## 2017-01-13 DIAGNOSIS — D500 Iron deficiency anemia secondary to blood loss (chronic): Secondary | ICD-10-CM | POA: Diagnosis not present

## 2017-01-13 DIAGNOSIS — R0602 Shortness of breath: Secondary | ICD-10-CM | POA: Insufficient documentation

## 2017-01-13 NOTE — Patient Instructions (Signed)
SCHEDULE AT [place] has requested that you have an echocardiogram. Echocardiography is a painless test that uses sound waves to create images of your heart. It provides your doctor with information about the size and shape of your heart and how well your heart's chambers and valves are working. This procedure takes approximately one hour. There are no restrictions for this procedure.   Your physician has requested that you have cardiac CT CALCIUM SCORING. Cardiac computed tomography (CT) is a painless test that uses an x-ray machine to take clear, detailed pictures of your heart. For further information please visit [facility]ugeFiesta[facility][email or web address]. Please follow instruction sheet as given.    Your physician recommends that you schedule a follow-up appointment in [place]

## 2017-01-13 NOTE — Progress Notes (Signed)
PCP: Martinique, Betty G, MD  Clinic Note: Chief Complaint  Patient presents with  . New Patient (Initial Visit)    Chest pain, shortness of breath    HPI: Holly Mcdonald is a morbidly obese 41 y.o. female who is being seen today for the evaluation of chest pain and shortness of breath at the request of Martinique, Betty G, MD. Most recent history was pulmonary embolus in June of this year. Recommendation was for lifelong anticoagulation  She is followed by Dr. Elsworth Soho from pulmonary medicine for asthma (frequent bouts of bronchitis) - > was told that her "lung function was normal " -> he recommended continued Advair and albuterol but no further workup.  She has very sensitive fibromyalgia  FHx of CVD: Father CAD at 80 and mother CHF  Holly Tahra Hitzeman was last seen by her PCP on August 14 - In part for emergency room follow-up for dyspnea. She ruled out for MI and PE. She asked to start the workup for diagnosis of fibromyalgia which is what her mother has. Indicating that she is painful to touch on the neck and knees. She wanted to start disability process. She is taking intermittent oxycodone. She noted intermittent chest pain episodes at rest and aggravated by activity. Pain lasted hours.  Recent Hospitalizations: ER visit 12/16/2016 for shortness of breath  Studies Personally Reviewed - (if available, images/films reviewed: From Epic Chart or Care Everywhere)  Echo November 04, 2016 - mild LVH. Systolic function was normal.   EF 55-60%. Mild LA dilation. (poor windows)  Interval History: [name] presents today seeming very defeated didn't fatigued. She appeared to be in tremendous pain and discomfort. She described episodes of chest discomfort they're on both sides of the sternum and are extremely painful to touch on both sides. She says these episodes can last for hours at a time. If she lies on one side versus the other they will hurt more. The pain is occurring at rest and  exacerbated by deep inspiration or cough. Moving her shoulders and certainly will also make it worse. Of course, she also has shortness of breath and therefore there is a correlation of the 2 symptoms, but they are not necessarily associated with each other. She is baseline short of breath and certainly most of his body habitus cannot lie down without getting short of breath. She will wake up short of breath. She indicates that she is short of breath walking around. She says that she uses her inhaler which helps some, but the Advair works better. She says that when she walks she uses a walker and is able to walk 12 minutes and 58 seconds before she has to stop to breathe.   She is noticing her heart rate responsiveness to exercise seems to be okay and not associated with rapid heartbeats when she is walking. When she is walking, she denies having the chest discomfort, and only notes any discomfort when she is breathing hard.  She denies any rapid heartbeats or palpitations to suggest arrhythmia. No syncope/near syncope or TIA/amaurosis fugax.  No claudication.  ROS: A comprehensive was performed. Review of Systems  Constitutional: Positive for malaise/fatigue. Negative for weight loss.  HENT: Positive for congestion. Negative for nosebleeds.   Eyes: Negative.   Respiratory: Positive for cough, sputum production (Starting to note some discolored mucus production), shortness of breath and wheezing.        She thinks she is having another bout of bronchitis  Gastrointestinal: Negative for blood in  stool and melena.  Genitourinary: Positive for frequency (Including frequent nocturia). Negative for hematuria.  Musculoskeletal: Positive for myalgias. Negative for falls. Joint pain: Pain in most joints.  Neurological: Negative for focal weakness.  Endo/Heme/Allergies: Positive for environmental allergies.  Psychiatric/Behavioral: Negative for depression (Somewhat dysthymic, but lacks the anhedonia  component), substance abuse and suicidal ideas. The patient is nervous/anxious. The patient does not have insomnia (She just doesn't sleep well).   All other systems reviewed and are negative.  I have reviewed and (if needed) personally updated the patient's problem list, medications, allergies, past medical and surgical history, social and family history.   Past Medical History:  Diagnosis Date  . Allergy   . Asthma   . Asthma in adult, unspecified asthma severity, uncomplicated 08/08/9377  . Bilateral pulmonary embolism ([place]) 11/03/2016  . Morbid obesity with BMI of 45.0-49.9, adult [facility])     Past Surgical History:  Procedure Laterality Date  . RECTAL EXAMINATION UNDER ANESTHESIA W/ CYSTOSCOPY    . TONSILLECTOMY    . TRANSTHORACIC ECHOCARDIOGRAM  11/04/2016    (In setting of bilateral PE) mild LVH. Systolic function was normal.   EF 55-60%. Mild LA dilation. (poor windows)    Current Meds  Medication Sig  . acetaminophen (TYLENOL) 650 MG CR tablet Take 650 mg by mouth every 8 (eight) hours as needed for pain.  Marland Kitchen albuterol (PROVENTIL HFA;VENTOLIN HFA) 108 (90 Base) MCG/ACT inhaler Inhale 1-2 puffs into the lungs every 6 (six) hours as needed for wheezing or shortness of breath.  Marland Kitchen albuterol (PROVENTIL) (2.5 MG/3ML) 0.083% nebulizer solution Take 3 mLs (2.5 mg total) by nebulization every 6 (six) hours as needed for wheezing or shortness of breath.  Marland Kitchen apixaban (ELIQUIS STARTER PACK) 5 MG TABS tablet Take 1 tablet (5 mg total) by mouth 2 (two) times daily.  . benzonatate (TESSALON) 100 MG capsule Take 1 capsule (100 mg total) by mouth every 8 (eight) hours.  . calcium carbonate (TUMS - DOSED IN MG ELEMENTAL CALCIUM) 500 MG chewable tablet Chew 1 tablet by mouth daily as needed.   . cetirizine (ZYRTEC) 10 MG tablet Take 10 mg by mouth daily.  . DULoxetine (CYMBALTA) 30 MG capsule Take 1 capsule (30 mg total) by mouth daily.  . fluticasone (FLONASE) 50 MCG/ACT nasal spray Place 1 spray into  both nostrils daily.  . Fluticasone-Salmeterol (ADVAIR DISKUS) 250-50 MCG/DOSE AEPB Inhale 1 puff into the lungs 2 (two) times daily.  . methocarbamol (ROBAXIN) 500 MG tablet Take 1 tablet (500 mg total) by mouth every 8 (eight) hours as needed for muscle spasms.  Marland Kitchen omeprazole (PRILOSEC) 40 MG capsule Take 1 capsule (40 mg total) by mouth daily.  Marland Kitchen oxybutynin (DITROPAN) 5 MG tablet Take 1 tablet by mouth 2 (two) times daily.  Marland Kitchen oxyCODONE (OXY IR/ROXICODONE) 5 MG immediate release tablet Take 5 mg by mouth every 4 (four) hours as needed for severe pain.  . predniSONE (STERAPRED UNI-PAK 21 TAB) 10 MG (21) TBPK tablet Take 6 tabs on day 1, 5 tabs on day 2, 4 tabs on day 3, 3 tabs on day 4, 2 tabs on day 5, and 1 tab on day 6.  . timolol (TIMOPTIC) 0.5 % ophthalmic solution Place 1 drop into both eyes every morning.  . Vitamin D, Ergocalciferol, (DRISDOL) 50000 units CAPS capsule 1 cap weekly for 8 weeks then every 2 weeks.    Allergies  Allergen Reactions  . Other Nausea And Vomiting    Darvocet N-100  Social History   Social History  . Marital status: Divorced    Spouse name: N/A  . Number of children: N/A  . Years of education: N/A   Social History Main Topics  . Smoking status: Never Smoker  . Smokeless tobacco: Never Used  . Alcohol use Yes  . Drug use: No  . Sexual activity: Not Asked   Other Topics Concern  . None   Social History Narrative  . None    family history includes Congestive Heart Failure in her maternal aunt and mother; Diabetes in her paternal grandmother; Heart attack in her paternal grandmother; Heart disease in her mother; Heart disease (age of onset: 90) in her father; Hyperlipidemia in her father; Mental retardation in her mother; Pulmonary embolism in her mother.  Wt Readings from Last 3 Encounters:  01/13/17 (!) 345 lb (156.5 kg)  12/21/16 (!) 354 lb (160.6 kg)  12/20/16 (!) 350 lb 8 oz (159 kg)    PHYSICAL EXAM BP 100/70   Pulse 99   Ht 5'  10" (1.778 m)   Wt (!) 345 lb (156.5 kg)   BMI 49.50 kg/m  Physical Exam  Constitutional: She is oriented to person, place, and time. She appears well-developed and well-nourished. No distress ( She appears to be in pain. Constant grimace type facial expression).  Morbidly obese.  HENT:  Head: Normocephalic and atraumatic.  Mouth/Throat: No oropharyngeal exudate.  Eyes: Pupils are equal, round, and reactive to light. Conjunctivae and EOM are normal.  Neck: Normal range of motion. Neck supple. Carotid bruit is not present.  Unable to palpate thyroid or truly visualize JVD. She noted discomfort with palpation.  Cardiovascular: Regular rhythm, S1 normal, S2 normal and intact distal pulses.  PMI displaced: Unable to assess due to body habitus.  Exam reveals distant heart sounds. Exam reveals no gallop.   No murmur heard. Borderline tachycardia.  Pulmonary/Chest: Effort normal. No respiratory distress. She has no wheezes. She has no rales. She exhibits tenderness (Chest wall tenderness along both sides of the sternal margin. This reproduces her pain and it is made worse with palpation and then even worse when she takes deep breath.).  Distant breath sounds  Abdominal: Soft. Bowel sounds are normal. She exhibits no distension. There is no tenderness. There is no rebound.  Morbid truncal obesity  Musculoskeletal: Normal range of motion. She exhibits no edema.  I cannot tell if she has edema orifices to simply her body habitus. Does not appear to be pitting edema.  Neurological: She is alert and oriented to person, place, and time. No cranial nerve deficit.  Skin: Skin is warm and dry.  Psychiatric: Her speech is normal and behavior is normal. Judgment and thought content normal. Her affect is inappropriate. Cognition and memory are normal.  She has a very distressed affect and somewhat depressed mood.  Nursing note and vitals reviewed.    Adult ECG Report  Rate: 99 ;  Rhythm: normal sinus  rhythm, sinus arrhythmia and Low voltage. Cannot exclude inferior MI, age undetermined.;   Narrative Interpretation: Stable EKG. Likely difficult to read because of body habitus  Other studies Reviewed: Additional studies/ records that were reviewed today include:  Recent Labs:   Lab Results  Component Value Date   CREATININE 0.64 12/16/2016   BUN 9 12/16/2016   NA 137 12/16/2016   K 3.8 12/16/2016   CL 106 12/16/2016   CO2 23 12/16/2016   - She had negative troponin and check her PE and negative  troponin when she went to the hospital with chest pain on August 9. Lab Results  Component Value Date   WBC 7.4 12/16/2016   HGB 11.3 (L) 12/16/2016   HCT 34.8 (L) 12/16/2016   MCV 82.1 12/16/2016   PLT 265 12/16/2016    ASSESSMENT / PLAN: Problem List Items Addressed This Visit    Bilateral pulmonary embolism ([place]) - Primary (Chronic)    She had bilateral pulmonary emboli and is on essentially lifelong ELIQUIS. She still has persistent chest pain dyspnea that seemed to be well out of proportion with her PE Unfortunately she had an echocardiogram done in the hospital that was not a good quality study and did not even comment on the right heart.  I would like to have a repeat echocardiogram done in the outpatient setting since she is now still complaining of dyspnea several months out from a PE. I want to ensure that she does not have any evidence of pulmonary hypertension or right ventricular failure to explain dyspnea.  Plan: Repeat 2-D echo - focusing on ensuring that we have RV pressures      Relevant Orders   EKG 12-Lead (Completed)   ECHOCARDIOGRAM COMPLETE   Chest pain with low risk for cardiac etiology    The chest discomfort she is describing is reproducible on exam and made worse with deep inspiration. She is most consistent with costochondritis and its right and with her fibromyalgia symptoms.  Unfortunately, she really takes pain medications and I'm not sure if an NSAID  taper with the be the best option. We could consider a prednisone taper if symptoms persist.       Relevant Orders   EKG 12-Lead (Completed)   CT CARDIAC SCORING   ECHOCARDIOGRAM COMPLETE   DOE (dyspnea on exertion)    Clearly this is multifactorial, not at least related to her body habitus. Certainly it seems to be out of proportion with just simply obesity. 2-D echocardiogram to evaluate for signs of either diastolic dysfunction or pulmonary hypertension. I would prefer to avoid right heart catheterization which should be the only definitive way to find out her pressures.  To exclude coronary artery disease, we will check coronary CT calcium score for part of screening evaluation. If abnormal would probably defer to the imaging specialists as to whether a Myoview stress test versus coronary CTA will be the next best test.      Relevant Orders   EKG 12-Lead (Completed)   CT CARDIAC SCORING   ECHOCARDIOGRAM COMPLETE   Family history of premature CAD (Chronic)    Because of her dyspnea and atypical chest discomfort we will restart by with a coronary calcium score. This will allow [place] to determine her risk stratification and decide if any further evaluation is required.      Relevant Orders   EKG 12-Lead (Completed)   CT CARDIAC SCORING   Iron deficiency anemia due to chronic blood loss    Most recent hemoglobin level was a little low, but would not explain her dyspnea.      Obesity, Class III, BMI 40-49.9 (morbid obesity) ([facility]) (Chronic)    Clearly to plan a component in her dyspnea, and is probably not helping her chest pain either because of added pressure when she lies down on one side versus the other.         Current medicines are reviewed at length with the patient today. (+/- concerns) None The following changes have been made: None  Patient Instructions  SCHEDULE  AT [place] has requested that you have an echocardiogram.  Echocardiography is a painless test that uses sound waves to create images of your heart. It provides your doctor with information about the size and shape of your heart and how well your heart's chambers and valves are working. This procedure takes approximately one hour. There are no restrictions for this procedure.   Your physician has requested that you have cardiac CT CALCIUM SCORING. Cardiac computed tomography (CT) is a painless test that uses an x-ray machine to take clear, detailed pictures of your heart. For further information please visit [facility]ugeFiesta[facility][email or web address]. Please follow instruction sheet as given.    Your physician recommends that you schedule a follow-up appointment in 1 MONTH WITH DR Cecely Rengel    Studies Ordered:   Orders Placed This Encounter  Procedures  . CT CARDIAC SCORING  . EKG 12-Lead  . ECHOCARDIOGRAM COMPLETE      Glenetta Hew, M.D., M.S. Interventional Cardiologist   Pager # [phone] Phone # [phone] [address] [place], [place] [address]

## 2017-01-14 ENCOUNTER — Encounter: Payer: Self-pay | Admitting: Cardiology

## 2017-01-16 ENCOUNTER — Encounter: Payer: Self-pay | Admitting: Cardiology

## 2017-01-16 NOTE — Assessment & Plan Note (Signed)
Because of her dyspnea and atypical chest discomfort we will restart by with a coronary calcium score. This will allow [place] to determine her risk stratification and decide if any further evaluation is required.

## 2017-01-16 NOTE — Assessment & Plan Note (Signed)
Most recent hemoglobin level was a little low, but would not explain her dyspnea.

## 2017-01-16 NOTE — Assessment & Plan Note (Signed)
Clearly this is multifactorial, not at least related to her body habitus. Certainly it seems to be out of proportion with just simply obesity. 2-D echocardiogram to evaluate for signs of either diastolic dysfunction or pulmonary hypertension. I would prefer to avoid right heart catheterization which should be the only definitive way to find out her pressures.  To exclude coronary artery disease, we will check coronary CT calcium score for part of screening evaluation. If abnormal would probably defer to the imaging specialists as to whether a Myoview stress test versus coronary CTA will be the next best test.

## 2017-01-16 NOTE — Assessment & Plan Note (Signed)
She had bilateral pulmonary emboli and is on essentially lifelong ELIQUIS. She still has persistent chest pain dyspnea that seemed to be well out of proportion with her PE Unfortunately she had an echocardiogram done in the hospital that was not a good quality study and did not even comment on the right heart.  I would like to have a repeat echocardiogram done in the outpatient setting since she is now still complaining of dyspnea several months out from a PE. I want to ensure that she does not have any evidence of pulmonary hypertension or right ventricular failure to explain dyspnea.  Plan: Repeat 2-D echo - focusing on ensuring that we have RV pressures

## 2017-01-16 NOTE — Assessment & Plan Note (Signed)
The chest discomfort she is describing is reproducible on exam and made worse with deep inspiration. She is most consistent with costochondritis and its right and with her fibromyalgia symptoms.  Unfortunately, she really takes pain medications and I'm not sure if an NSAID taper with the be the best option. We could consider a prednisone taper if symptoms persist.

## 2017-01-16 NOTE — Assessment & Plan Note (Signed)
Clearly to plan a component in her dyspnea, and is probably not helping her chest pain either because of added pressure when she lies down on one side versus the other.

## 2017-01-17 ENCOUNTER — Ambulatory Visit (HOSPITAL_COMMUNITY): Payer: 59 | Attending: Cardiovascular Disease

## 2017-01-17 ENCOUNTER — Ambulatory Visit (INDEPENDENT_AMBULATORY_CARE_PROVIDER_SITE_OTHER)
Admission: RE | Admit: 2017-01-17 | Discharge: 2017-01-17 | Disposition: A | Discharge status: Home / Self Care | Payer: 59 | Source: Ambulatory Visit | Attending: Cardiology | Admitting: Cardiology

## 2017-01-17 ENCOUNTER — Encounter: Payer: 59 | Admitting: Rehabilitation

## 2017-01-17 ENCOUNTER — Other Ambulatory Visit: Payer: Self-pay

## 2017-01-17 DIAGNOSIS — I2699 Other pulmonary embolism without acute cor pulmonale: Secondary | ICD-10-CM | POA: Diagnosis not present

## 2017-01-17 DIAGNOSIS — Z6841 Body Mass Index (BMI) 40.0 and over, adult: Secondary | ICD-10-CM | POA: Insufficient documentation

## 2017-01-17 DIAGNOSIS — R0609 Other forms of dyspnea: Secondary | ICD-10-CM

## 2017-01-17 DIAGNOSIS — J45909 Unspecified asthma, uncomplicated: Secondary | ICD-10-CM | POA: Insufficient documentation

## 2017-01-17 DIAGNOSIS — R079 Chest pain, unspecified: Secondary | ICD-10-CM | POA: Diagnosis not present

## 2017-01-17 DIAGNOSIS — R0600 Dyspnea, unspecified: Secondary | ICD-10-CM

## 2017-01-17 DIAGNOSIS — J40 Bronchitis, not specified as acute or chronic: Secondary | ICD-10-CM | POA: Insufficient documentation

## 2017-01-17 DIAGNOSIS — I071 Rheumatic tricuspid insufficiency: Secondary | ICD-10-CM | POA: Insufficient documentation

## 2017-01-17 DIAGNOSIS — Z8249 Family history of ischemic heart disease and other diseases of the circulatory system: Secondary | ICD-10-CM

## 2017-01-17 HISTORY — PX: TRANSTHORACIC ECHOCARDIOGRAM: SHX275

## 2017-01-17 LAB — ECHOCARDIOGRAM COMPLETE
CHL CUP MV DEC (S): 204
CHL CUP RV SYS PRESS: 20 mmHg
E/e' ratio: 6.7
EWDT: 204 ms
FS: 37 % (ref 28–44)
IVS/LV PW RATIO, ED: 1.11
LA vol: 63.4 mL
LADIAMINDEX: 1.26 cm/m2
LASIZE: 36 mm
LAVOLA4C: 70.4 mL
LAVOLIN: 22.1 mL/m2
LDCA: 4.15 cm2
LEFT ATRIUM END SYS DIAM: 36 mm
LV PW d: 9.6 mm — AB (ref 0.6–1.1)
LV TDI E'LATERAL: 14.4
LV TDI E'MEDIAL: 10.3
LVEEAVG: 6.7
LVEEMED: 6.7
LVELAT: 14.4 cm/s
LVOTD: 23 mm
Lateral S' vel: 13.4 cm/s
MV Peak grad: 4 mmHg
MV pk E vel: 96.5 m/s
MVPKAVEL: 76.2 m/s
Reg peak vel: 209 cm/s
TR max vel: 209 cm/s

## 2017-01-17 MED ORDER — PERFLUTREN LIPID MICROSPHERE
1.0000 mL | INTRAVENOUS | Status: AC | PRN
  Administered 2017-01-17: 2 mL via INTRAVENOUS

## 2017-01-19 ENCOUNTER — Ambulatory Visit: Payer: 59 | Attending: Family Medicine

## 2017-01-19 DIAGNOSIS — R2689 Other abnormalities of gait and mobility: Secondary | ICD-10-CM | POA: Diagnosis present

## 2017-01-19 DIAGNOSIS — M6281 Muscle weakness (generalized): Secondary | ICD-10-CM | POA: Diagnosis present

## 2017-01-19 DIAGNOSIS — M25551 Pain in right hip: Secondary | ICD-10-CM | POA: Diagnosis present

## 2017-01-19 NOTE — Therapy (Signed)
[facility] [address] [place], [place], [address] Phone: [phone]   Fax:  [phone]  Physical Therapy Treatment  Patient Details  Name: Holly Mcdonald MRN: [id number] Date of Birth: 07/22/1975 Referring Provider: Martinique, Betty, MD  Encounter Date: 01/19/2017      PT End of Session - 01/19/17 0840    Visit Number 17   Date for PT Re-Evaluation 02/18/17   PT Start Time 0802   PT Stop Time 0841   PT Time Calculation (min) 39 min   Activity Tolerance Patient tolerated treatment well   Behavior During Therapy [facility] for tasks assessed/performed      Past Medical History:  Diagnosis Date  . Allergy   . Asthma   . Asthma in adult, unspecified asthma severity, uncomplicated 08/16/1854  . Bilateral pulmonary embolism ([place]) 11/03/2016  . Morbid obesity with BMI of 45.0-49.9, adult [facility])     Past Surgical History:  Procedure Laterality Date  . RECTAL EXAMINATION UNDER ANESTHESIA W/ CYSTOSCOPY    . TONSILLECTOMY    . TRANSTHORACIC ECHOCARDIOGRAM  11/04/2016    (In setting of bilateral PE) mild LVH. Systolic function was normal.   EF 55-60%. Mild LA dilation. (poor windows)    There were no vitals filed for this visit.      Subjective Assessment - 01/19/17 0804    Subjective I have been walking every day for 20-30 minutes.  I walked at [facility] for 45 minutes.     Pertinent History pulmonary embolism 11/02/16 with reduced lung capacity/endurance. Pt attempted work last week. She only made 2 calls before going home.    Currently in Pain? No/denies                         [facility] Adult PT Treatment/Exercise - 01/19/17 0001      Lumbar Exercises: Aerobic   Elliptical Level 2, ramp 5 x 3 minutes  rest breaks taken   Tread Mill 1.5 to 1.7 mph x 13 minutes, 30 seconds     Knee/Hip Exercises: Stretches   Active Hamstring Stretch Both;2 reps;20 seconds     Knee/Hip Exercises: Aerobic    Nustep L1-3 10 min total  Achieved 3 laps in 8 minutes, 45 seconds     Knee/Hip Exercises: Standing   Heel Raises Both;20 reps;1 set   Hip Flexion Stengthening;Both;2 sets;20 reps;Knee straight   Hip Flexion Limitations 4#   Hip Abduction Stengthening;Both;2 sets;20 reps;Knee straight   Abduction Limitations 4#   Hip Extension Stengthening;Both;2 sets;20 reps;Knee straight   Extension Limitations 4#                  PT Short Term Goals - 12/15/16 0817      PT SHORT TERM GOAL #4   Title walk for 5-10 minutes without fatigue or need to rest   Time 4   Period Weeks   Status Achieved           PT Long Term Goals - 01/19/17 3149      PT LONG TERM GOAL #1   Title be independent in advanced HEP   Time 6   Period Weeks   Status On-going     PT LONG TERM GOAL #2   Title reduce FOTO to < or = to 38% limitation   Baseline 40% limitation   Time 6   Period Weeks   Status On-going     PT LONG TERM GOAL #3  Title get in and out of bed without UE assistance with Rt LE   Status Achieved     PT LONG TERM GOAL #4   Title improve cardiovascular endurance to walk for 15-20 minutes without rest   Baseline walking 20-30 minutes and rest breaks are needed   Time 6   Period Weeks   Status On-going     PT LONG TERM GOAL #5   Title report < or = to 3/10 Rt LE pain with standing and walking   Baseline this varies   Time 6   Period Weeks   Status On-going               Plan - 01/19/17 0807    Clinical Impression Statement Pt reports that she has been walking for 20-30 minutes outside with rest breaks taken.  Pt with continued Rt hip pain and limited A/ROM and has been doing stretching exercises for this.  Pt with improving endurance overall.  Pt will continue to benefit from skilled PT for Rt hip flexibility, strength and endurace progression.     Rehab Potential Good   PT Frequency 2x / week   PT Duration 6 weeks   PT Treatment/Interventions ADLs/Self Care  Home Management;Cryotherapy;Electrical Stimulation;Functional mobility training;Stair training;Ultrasound;Therapeutic activities;Moist Heat;Therapeutic exercise;Neuromuscular re-education;Patient/family education;Passive range of motion;Dry needling;Taping   PT Next Visit Plan Rt hip strength, focus on ER A/ROM, endurance progression.  Work on adding time on elliptical.     Recommended Other Services initial and recerti have been signed   Consulted and Agree with Plan of Care Patient      Patient will benefit from skilled therapeutic intervention in order to improve the following deficits and impairments:  Abnormal gait, Decreased range of motion, Increased muscle spasms, Decreased endurance, Cardiopulmonary status limiting activity, Decreased activity tolerance, Pain, Decreased strength, Impaired flexibility  Visit Diagnosis: Muscle weakness (generalized)  Pain in right hip  Other abnormalities of gait and mobility     Problem List Patient Active Problem List   Diagnosis Date Noted  . DOE (dyspnea on exertion) 01/13/2017  . Chest pain with low risk for cardiac etiology 01/13/2017  . Family history of premature CAD 01/13/2017  . GERD (gastroesophageal reflux disease) 12/21/2016  . Vitamin D deficiency, unspecified 12/21/2016  . Iron deficiency anemia due to chronic blood loss 12/20/2016  . Asthma in adult, unspecified asthma severity, uncomplicated 58/85/0277  . Obesity, Class III, BMI 40-49.9 (morbid obesity) ([place]) 11/04/2016  . Pulmonary embolus ([place])   . Bilateral pulmonary embolism ([place]) 11/03/2016    Sigurd Sos, PT 01/19/17 8:43 AM  [facility] [address] [place], [place], [address] Phone: [phone]   Fax:  [phone]  Name: Brantlee Hinde MRN: [id number] Date of Birth: Aug 03, 1975

## 2017-01-21 ENCOUNTER — Ambulatory Visit: Payer: 59 | Admitting: Physical Therapy

## 2017-01-21 ENCOUNTER — Encounter: Payer: Self-pay | Admitting: Physical Therapy

## 2017-01-21 DIAGNOSIS — M25551 Pain in right hip: Secondary | ICD-10-CM

## 2017-01-21 DIAGNOSIS — R2689 Other abnormalities of gait and mobility: Secondary | ICD-10-CM

## 2017-01-21 DIAGNOSIS — M6281 Muscle weakness (generalized): Secondary | ICD-10-CM

## 2017-01-21 NOTE — Therapy (Signed)
[facility] [address], [place], [place], [address] Phone: [phone]   Fax:  [phone]  Physical Therapy Treatment  Patient Details  Name: Holly Mcdonald MRN: [id number] Date of Birth: 1975-11-29 Referring Provider: Martinique, Betty, MD  Encounter Date: 01/21/2017      PT End of Session - 01/21/17 0810    Visit Number 18   Date for PT Re-Evaluation 02/18/17   PT Start Time 0809   PT Stop Time 0900   PT Time Calculation (min) 51 min   Activity Tolerance Patient tolerated treatment well   Behavior During Therapy [facility] for tasks assessed/performed      Past Medical History:  Diagnosis Date  . Allergy   . Asthma   . Asthma in adult, unspecified asthma severity, uncomplicated 07/09/5174  . Bilateral pulmonary embolism ([place]) 11/03/2016  . Morbid obesity with BMI of 45.0-49.9, adult [facility])     Past Surgical History:  Procedure Laterality Date  . RECTAL EXAMINATION UNDER ANESTHESIA W/ CYSTOSCOPY    . TONSILLECTOMY    . TRANSTHORACIC ECHOCARDIOGRAM  11/04/2016    (In setting of bilateral PE) mild LVH. Systolic function was normal.   EF 55-60%. Mild LA dilation. (poor windows)    There were no vitals filed for this visit.      Subjective Assessment - 01/21/17 0811    Subjective My muscles ache from the weather today.    Pertinent History pulmonary embolism 11/02/16 with reduced lung capacity/endurance. Pt attempted work last week. She only made 2 calls before going home.    Currently in Pain? Yes   Pain Score 6    Pain Orientation --  All over   Aggravating Factors  weather today   Pain Relieving Factors heat   Multiple Pain Sites No                         [facility] Adult PT Treatment/Exercise - 01/21/17 0001      Lumbar Exercises: Aerobic   Elliptical L2 x 3:30  A few short rests to catch breath but less than 10 sec   Tread Mill 1.5 mph-1.86mph x 15 min     Knee/Hip Exercises:  Stretches   Active Hamstring Stretch Both;2 reps;20 seconds   Gastroc Stretch Both;2 reps;20 seconds     Knee/Hip Exercises: Standing   Heel Raises Both;20 reps;1 set  3# bil UE overhead press with ex   Hip Flexion Stengthening;Both;2 sets;20 reps;Knee straight   Hip Flexion Limitations 4#  3# overhead marches with UE   Hip Abduction Stengthening;Both;2 sets;20 reps;Knee straight  3# overhead press single arm at a time for balance   Abduction Limitations 4#   Hip Extension Stengthening;Both;2 sets;20 reps;Knee straight  Single arm overhead presses with 3#   Extension Limitations 4#                  PT Short Term Goals - 12/15/16 0817      PT SHORT TERM GOAL #4   Title walk for 5-10 minutes without fatigue or need to rest   Time 4   Period Weeks   Status Achieved           PT Long Term Goals - 01/19/17 1607      PT LONG TERM GOAL #1   Title be independent in advanced HEP   Time 6   Period Weeks   Status On-going     PT LONG TERM GOAL #2  Title reduce FOTO to < or = to 38% limitation   Baseline 40% limitation   Time 6   Period Weeks   Status On-going     PT LONG TERM GOAL #3   Title get in and out of bed without UE assistance with Rt LE   Status Achieved     PT LONG TERM GOAL #4   Title improve cardiovascular endurance to walk for 15-20 minutes without rest   Baseline walking 20-30 minutes and rest breaks are needed   Time 6   Period Weeks   Status On-going     PT LONG TERM GOAL #5   Title report < or = to 3/10 Rt LE pain with standing and walking   Baseline this varies   Time 6   Period Weeks   Status On-going               Plan - 01/21/17 0810    Clinical Impression Statement Increased all cardio today by at least 30 sec up to a min. Also added 3# weights when marching, increasing the difficulty of her exercises. Pt is monitored closely throught out the entire session. 4# remains challenging for hip strengthening exercises.      Rehab Potential Good   PT Frequency 2x / week   PT Duration 6 weeks   PT Treatment/Interventions ADLs/Self Care Home Management;Cryotherapy;Electrical Stimulation;Functional mobility training;Stair training;Ultrasound;Therapeutic activities;Moist Heat;Therapeutic exercise;Neuromuscular re-education;Patient/family education;Passive range of motion;Dry needling;Taping   PT Next Visit Plan Rt hip strength, focus on ER A/ROM, endurance progression.  Work on adding time on elliptical.     Consulted and Agree with Plan of Care --      Patient will benefit from skilled therapeutic intervention in order to improve the following deficits and impairments:  Abnormal gait, Decreased range of motion, Increased muscle spasms, Decreased endurance, Cardiopulmonary status limiting activity, Decreased activity tolerance, Pain, Decreased strength, Impaired flexibility  Visit Diagnosis: Muscle weakness (generalized)  Pain in right hip  Other abnormalities of gait and mobility     Problem List Patient Active Problem List   Diagnosis Date Noted  . DOE (dyspnea on exertion) 01/13/2017  . Chest pain with low risk for cardiac etiology 01/13/2017  . Family history of premature CAD 01/13/2017  . GERD (gastroesophageal reflux disease) 12/21/2016  . Vitamin D deficiency, unspecified 12/21/2016  . Iron deficiency anemia due to chronic blood loss 12/20/2016  . Asthma in adult, unspecified asthma severity, uncomplicated 69/62/9528  . Obesity, Class III, BMI 40-49.9 (morbid obesity) ([place]) 11/04/2016  . Pulmonary embolus ([place])   . Bilateral pulmonary embolism ([place]) 11/03/2016    Lunabella Badgett, PTA 01/21/2017, 8:45 AM  [facility] [address] [place], [place], [address] Phone: [phone]   Fax:  [phone]  Name: Holly Mcdonald MRN: [id number] Date of Birth: 10-Jul-1975

## 2017-01-24 ENCOUNTER — Encounter: Payer: Self-pay | Admitting: Physical Therapy

## 2017-01-24 ENCOUNTER — Ambulatory Visit: Payer: 59 | Admitting: Physical Therapy

## 2017-01-24 DIAGNOSIS — F419 Anxiety disorder, unspecified: Secondary | ICD-10-CM | POA: Insufficient documentation

## 2017-01-24 DIAGNOSIS — M6281 Muscle weakness (generalized): Secondary | ICD-10-CM | POA: Diagnosis not present

## 2017-01-24 DIAGNOSIS — M25551 Pain in right hip: Secondary | ICD-10-CM

## 2017-01-24 DIAGNOSIS — M797 Fibromyalgia: Secondary | ICD-10-CM | POA: Insufficient documentation

## 2017-01-24 DIAGNOSIS — R2689 Other abnormalities of gait and mobility: Secondary | ICD-10-CM

## 2017-01-24 NOTE — Therapy (Signed)
[facility] [address] [place], [place], [address] Phone: [phone]   Fax:  [phone]  Physical Therapy Treatment  Patient Details  Name: Holly Mcdonald MRN: [id number] Date of Birth: 07-Mar-1976 Referring Provider: Martinique, Betty, MD  Encounter Date: 01/24/2017      PT End of Session - 01/24/17 0804    Visit Number 19   Date for PT Re-Evaluation 02/18/17   PT Start Time 0803   PT Stop Time 0848   PT Time Calculation (min) 45 min   Activity Tolerance Patient tolerated treatment well   Behavior During Therapy [facility] for tasks assessed/performed      Past Medical History:  Diagnosis Date  . Allergy   . Asthma   . Asthma in adult, unspecified asthma severity, uncomplicated 06/11/9796  . Bilateral pulmonary embolism ([place]) 11/03/2016  . Morbid obesity with BMI of 45.0-49.9, adult [facility])     Past Surgical History:  Procedure Laterality Date  . RECTAL EXAMINATION UNDER ANESTHESIA W/ CYSTOSCOPY    . TONSILLECTOMY    . TRANSTHORACIC ECHOCARDIOGRAM  11/04/2016    (In setting of bilateral PE) mild LVH. Systolic function was normal.   EF 55-60%. Mild LA dilation. (poor windows)    There were no vitals filed for this visit.      Subjective Assessment - 01/24/17 0805    Subjective Felt ok after last session, no complaints.   Pertinent History pulmonary embolism 11/02/16 with reduced lung capacity/endurance. Pt attempted work last week. She only made 2 calls before going home.    Limitations Walking   Currently in Pain? --  humidity is making chest hurt: has MD appt tomorrow to check lungs   Pain Score 3    Multiple Pain Sites No                         [facility] Adult PT Treatment/Exercise - 01/24/17 0001      Lumbar Exercises: Aerobic   Elliptical L2 x 4  short rest breaks   Tread Mill 2mph-2.2pmh x 10 min  PTA monitored     Knee/Hip Exercises: Stretches   Active Hamstring Stretch  Both;2 reps;20 seconds   Gastroc Stretch Both;2 reps;20 seconds     Knee/Hip Exercises: Standing   Heel Raises Both;20 reps;1 set  3# bil UE overhead press with ex   Hip Flexion Stengthening;Both;2 sets;20 reps;Knee straight   Hip Flexion Limitations 4#  3# overhead marches with UE   Hip Abduction Stengthening;Both;2 sets;20 reps;Knee straight  3# overhead press single arm at a time for balance   Abduction Limitations 4#   Hip Extension Stengthening;Both;2 sets;20 reps;Knee straight  Single arm overhead presses with 3#   Extension Limitations 4#                  PT Short Term Goals - 12/15/16 0817      PT SHORT TERM GOAL #4   Title walk for 5-10 minutes without fatigue or need to rest   Time 4   Period Weeks   Status Achieved           PT Long Term Goals - 01/24/17 9211      PT LONG TERM GOAL #1   Title be independent in advanced HEP   Time 6   Period Weeks     PT LONG TERM GOAL #6   Title demonstrate >or = to 4+/5 Rt hip strength and full A/ROM to improve  functional mobility   Time 6   Period Weeks   Status On-going  depends on the day               Plan - 01/24/17 0805    Clinical Impression Statement Pt working very hard, challenging both her cardiovascular system and hip strength; coordinating movements of her UE/ LE.  Able to increase time on eliptical  and speed on the treadmill. Pt was closely monitored throughout the session due to hershortness of breath.    PT Frequency 2x / week   PT Duration 6 weeks   PT Treatment/Interventions ADLs/Self Care Home Management;Cryotherapy;Electrical Stimulation;Functional mobility training;Stair training;Ultrasound;Therapeutic activities;Moist Heat;Therapeutic exercise;Neuromuscular re-education;Patient/family education;Passive range of motion;Dry needling;Taping   PT Next Visit Plan  FOTO, Rt hip strength, focus on ER A/ROM, endurance progression.  Work on adding time on elliptical and/or speed on  treadmill.    Consulted and Agree with Plan of Care Patient      Patient will benefit from skilled therapeutic intervention in order to improve the following deficits and impairments:  Abnormal gait, Decreased range of motion, Increased muscle spasms, Decreased endurance, Cardiopulmonary status limiting activity, Decreased activity tolerance, Pain, Decreased strength, Impaired flexibility  Visit Diagnosis: Muscle weakness (generalized)  Pain in right hip  Other abnormalities of gait and mobility     Problem List Patient Active Problem List   Diagnosis Date Noted  . DOE (dyspnea on exertion) 01/13/2017  . Chest pain with low risk for cardiac etiology 01/13/2017  . Family history of premature CAD 01/13/2017  . GERD (gastroesophageal reflux disease) 12/21/2016  . Vitamin D deficiency, unspecified 12/21/2016  . Iron deficiency anemia due to chronic blood loss 12/20/2016  . Asthma in adult, unspecified asthma severity, uncomplicated 28/78/6767  . Obesity, Class III, BMI 40-49.9 (morbid obesity) ([place]) 11/04/2016  . Pulmonary embolus ([place])   . Bilateral pulmonary embolism ([place]) 11/03/2016    Quiana Cobaugh, PTA 01/24/2017, 8:47 AM  [facility] [address] [place], [place], [address] Phone: [phone]   Fax:  [phone]  Name: Holly Mcdonald MRN: [id number] Date of Birth: 1975-08-02

## 2017-01-24 NOTE — Progress Notes (Signed)
HPI:   Holly Mcdonald is a 41 y.o. female, who is here today for 4 weeks follow-up.   She was seen on 12/21/16. Since her last OV, she has followed with cardiologists, Dr Ellyn Hack, because chest pain, 01/13/17.  She is also doing PT for right hip pain.  PE, first episode, she is following with hematologists, Dr Burr Medico. Currently on Eliquis 5 mg bid. Next follow up appt in 02/2017.  Asthma and exertional dyspnea, follows with pulmonologist, Dr Elsworth Soho. Next appt in 03/2017  She is requesting referral to pulmonary rehab. According to pt, it was recommended by PT due to PE.  She is also requesting a permanent handicap sticker. I have given her temporal handicap stickers, which I thought were appropriate right after hospital discharge and initial recovery. Last OV she also requested evaluation for disability, today she states that she would like to hold on this for now.     GERD:  She was having heartburn. Last OV Omeprazole 40 mg was recommended. She has had just one episode of acid reflux.  Denies abdominal pain, nausea, vomiting, changes in bowel habits, blood in stool or melena.   Generalized pain, fibromyalgia: Cymbalta 30 mg and Methocarbamol were started. Pain still "all over", severe, exacerbated by movement and alleviated some by rest. She still has some Oxycodone left and takes it occasionally. Mainly she takes Acetaminophen.  She is still having PT 2 times per week, she feels like this has helped with endurance and exertional dyspnea.  Anxiety has improved some. She denies side effects from medication.  She is back to work and had some anxiety initially but it has improved. She is also uses "essential oils",which  she inhales and help with anxiety. She denies depressed mood or suicidal thoughts.  Obesity:  Dietary changes since her last OV : Decreased portions. Exercise: Not consistently. She has noted some wt loss since she changed her diet.  New  concerns today:  Today she is concerned about "bumps" on right axillar area. She has not noted skin erythema. +Tenderness with palpation or shoulder movement. No Hx of trauma.  She has not identified breast masses. She has annual mammograms and next one in 02/2017.  Also cough with clear "mucus." No sick contact or recent travel. + Post nasal drainage. Denies associated fever,chills, wheezing or worsening exertional dyspnea or chest pain.    Review of Systems  Constitutional: Positive for fatigue. Negative for appetite change, diaphoresis and fever.  HENT: Positive for postnasal drip. Negative for mouth sores, nosebleeds, sore throat and trouble swallowing.   Eyes: Negative for redness and visual disturbance.  Respiratory: Positive for cough and shortness of breath. Negative for wheezing.   Cardiovascular: Positive for chest pain. Negative for palpitations and leg swelling.  Gastrointestinal: Negative for abdominal pain, nausea and vomiting.       Negative for changes in bowel habits.  Endocrine: Negative for cold intolerance and heat intolerance.  Genitourinary: Negative for decreased urine volume, dysuria and hematuria.  Musculoskeletal: Positive for arthralgias and myalgias. Negative for gait problem and joint swelling.  Skin: Negative for pallor and rash.  Allergic/Immunologic: Positive for environmental allergies.  Neurological: Negative for syncope, weakness and headaches.  Psychiatric/Behavioral: Negative for confusion and sleep disturbance. The patient is nervous/anxious.       Current Outpatient Prescriptions on File Prior to Visit  Medication Sig Dispense Refill  . acetaminophen (TYLENOL) 650 MG CR tablet Take 650 mg by mouth every 8 (eight) hours as  needed for pain.    Marland Kitchen albuterol (PROVENTIL HFA;VENTOLIN HFA) 108 (90 Base) MCG/ACT inhaler Inhale 1-2 puffs into the lungs every 6 (six) hours as needed for wheezing or shortness of breath. 18 g 2  . albuterol  (PROVENTIL) (2.5 MG/3ML) 0.083% nebulizer solution Take 3 mLs (2.5 mg total) by nebulization every 6 (six) hours as needed for wheezing or shortness of breath. 360 mL 5  . apixaban (ELIQUIS STARTER PACK) 5 MG TABS tablet Take 1 tablet (5 mg total) by mouth 2 (two) times daily. 60 tablet 2  . calcium carbonate (TUMS - DOSED IN MG ELEMENTAL CALCIUM) 500 MG chewable tablet Chew 1 tablet by mouth daily as needed.     . cetirizine (ZYRTEC) 10 MG tablet Take 10 mg by mouth daily.    . fluticasone (FLONASE) 50 MCG/ACT nasal spray Place 1 spray into both nostrils daily. 16 g 3  . Fluticasone-Salmeterol (ADVAIR DISKUS) 250-50 MCG/DOSE AEPB Inhale 1 puff into the lungs 2 (two) times daily. 60 each 2  . methocarbamol (ROBAXIN) 500 MG tablet Take 1 tablet (500 mg total) by mouth every 8 (eight) hours as needed for muscle spasms. 45 tablet 1  . omeprazole (PRILOSEC) 40 MG capsule Take 1 capsule (40 mg total) by mouth daily. 30 capsule 2  . oxybutynin (DITROPAN) 5 MG tablet Take 1 tablet by mouth 2 (two) times daily.  11  . predniSONE (STERAPRED UNI-PAK 21 TAB) 10 MG (21) TBPK tablet Take 6 tabs on day 1, 5 tabs on day 2, 4 tabs on day 3, 3 tabs on day 4, 2 tabs on day 5, and 1 tab on day 6. 21 tablet 0  . timolol (TIMOPTIC) 0.5 % ophthalmic solution Place 1 drop into both eyes every morning.    . Vitamin D, Ergocalciferol, (DRISDOL) 50000 units CAPS capsule 1 cap weekly for 8 weeks then every 2 weeks. 12 capsule 2   No current facility-administered medications on file prior to visit.      Past Medical History:  Diagnosis Date  . Allergy   . Asthma   . Asthma in adult, unspecified asthma severity, uncomplicated 06/17/4130  . Bilateral pulmonary embolism ([place]) 11/03/2016  . Morbid obesity with BMI of 45.0-49.9, adult (HCC)    Allergies  Allergen Reactions  . Other Nausea And Vomiting    Darvocet N-100    Social History   Social History  . Marital status: Divorced    Spouse name: N/A  . Number of  children: N/A  . Years of education: N/A   Social History Main Topics  . Smoking status: Never Smoker  . Smokeless tobacco: Never Used  . Alcohol use Yes  . Drug use: No  . Sexual activity: Not Asked   Other Topics Concern  . None   Social History Narrative  . None    Vitals:   01/25/17 0743  BP: 126/80  Pulse: 97  Resp: 12  SpO2: 93%   Body mass index is 49.13 kg/m.  Wt Readings from Last 3 Encounters:  01/25/17 (!) 342 lb 6 oz (155.3 kg)  01/13/17 (!) 345 lb (156.5 kg)  12/21/16 (!) 354 lb (160.6 kg)     Physical Exam  Nursing note and vitals reviewed. Constitutional: She is oriented to person, place, and time. She appears well-developed. No distress.  HENT:  Head: Normocephalic and atraumatic.  Mouth/Throat: Oropharynx is clear and moist and mucous membranes are normal.  Eyes: Pupils are equal, round, and reactive to light. Conjunctivae are  normal.  Cardiovascular: Normal rate and regular rhythm.   No murmur heard. Pulses:      Dorsalis pedis pulses are 2+ on the right side, and 2+ on the left side.  Respiratory: Effort normal and breath sounds normal. No respiratory distress.  No cough during OV.  GI: Soft. She exhibits no mass. There is no hepatomegaly. There is no tenderness.  Musculoskeletal: She exhibits edema (Trace pitting LE edema, mainly peri ankle, bilateral.) and tenderness.       Cervical back: She exhibits tenderness.       Thoracic back: She exhibits tenderness.       Lumbar back: She exhibits tenderness.  Tenderness upon light touch of muscles on back, upper and lower extremities, abdominal wall, and chest wall.  Lymphadenopathy:    She has no cervical adenopathy.    She has no axillary adenopathy.  Neurological: She is alert and oriented to person, place, and time. She has normal strength. Coordination and gait normal.  Skin: Skin is warm. No rash noted. No erythema.  Psychiatric: Her mood appears anxious.  Well groomed, poor eye contact.       ASSESSMENT AND PLAN:   Ms. Lianni was seen today for follow-up.  Diagnoses and all orders for this visit:  Gastroesophageal reflux disease, esophagitis presence not specified  Improved. GERD precautions to continue. No changes in Omeprazole for now, will consider decreasing dose next OV.  Obesity, Class III, BMI 40-49.9 (morbid obesity) ([place])  She has lost about 10-12 Lb since 12/2016. We discussed benefits of wt loss as well as adverse effects of obesity. Consistency with healthy diet and physical activity recommended. Daily brisk walking for 15-30 min as tolerated.  Fibromyalgia muscle pain  Educated about Dx. Low impact exercise and good sleep hygiene. She has tolerated Cymbalta well, increased from 30 mg to 60 mg. May consider adding Gabapentin next OV. I really do not think she needs a permanent handicap sticker, she disagrees. She refused referral to pain management, rehab medicine. States that she does not have restrictions in regard to pain medications in case she needs procedures in the future. F/U in 3 months.  -     DULoxetine (CYMBALTA) 60 MG capsule; Take 1 capsule (60 mg total) by mouth daily.  Anxiety disorder, unspecified type  Improved some. Cymbalta dose increased. Instructed about warning signs. F/U in 3 months, before if needed.  -     DULoxetine (CYMBALTA) 60 MG capsule; Take 1 capsule (60 mg total) by mouth daily.  Cough  Auscultation today negative. Possible causes discussed: ? Allergies,GERD,asthma, beginning of URI among some. Instructed to monitor for new symptoms and about warning signs.  Need for influenza vaccination -     Flu Vaccine QUAD 36+ mos IM     She will continue following with Dr Elsworth Soho for asthma, Dr Burr Medico for PE and oral anticoagulation, and with Dr Ellyn Hack (cardiologists). Also instructed to arrange her gyn preventive visit.     Lasonya Hubner G. Martinique, MD  [facility]. [place]  office.

## 2017-01-25 ENCOUNTER — Encounter: Payer: Self-pay | Admitting: Family Medicine

## 2017-01-25 ENCOUNTER — Ambulatory Visit (INDEPENDENT_AMBULATORY_CARE_PROVIDER_SITE_OTHER): Payer: 59 | Admitting: Family Medicine

## 2017-01-25 VITALS — BP 126/80 | HR 97 | Resp 12 | Ht 70.0 in | Wt 342.4 lb

## 2017-01-25 DIAGNOSIS — F419 Anxiety disorder, unspecified: Secondary | ICD-10-CM | POA: Diagnosis not present

## 2017-01-25 DIAGNOSIS — Z23 Encounter for immunization: Secondary | ICD-10-CM | POA: Diagnosis not present

## 2017-01-25 DIAGNOSIS — K219 Gastro-esophageal reflux disease without esophagitis: Secondary | ICD-10-CM | POA: Diagnosis not present

## 2017-01-25 DIAGNOSIS — R05 Cough: Secondary | ICD-10-CM

## 2017-01-25 DIAGNOSIS — M797 Fibromyalgia: Secondary | ICD-10-CM

## 2017-01-25 DIAGNOSIS — R059 Cough, unspecified: Secondary | ICD-10-CM

## 2017-01-25 MED ORDER — DULOXETINE HCL 60 MG PO CPEP: 60.0000 mg | ORAL_CAPSULE | Freq: Every day | ORAL | 1 refills | Status: DC

## 2017-01-25 NOTE — Patient Instructions (Addendum)
A few things to remember from today's visit:   Gastroesophageal reflux disease, esophagitis presence not specified  Obesity, Class III, BMI 40-49.9 (morbid obesity) (HCC)  Anxiety disorder, unspecified type - Plan: DULoxetine (CYMBALTA) 60 MG capsule  Fibromyalgia muscle pain - Plan: DULoxetine (CYMBALTA) 60 MG capsule  Today Cymbalta increased to 60 mg.  Continue following with Dr Burr Medico for pulmonary embolism and anemia. Continue following with Dr Elsworth Soho for asthma. Try to increase physical activity as tolerated. I do not think you need a permanent handicap sticker. Referral to pain specialists can be arranged, please let me know if you decide to do so.  Please be sure medication list is accurate. If a new problem present, please set up appointment sooner than planned today.

## 2017-01-26 ENCOUNTER — Ambulatory Visit: Payer: 59 | Admitting: Physical Therapy

## 2017-01-26 ENCOUNTER — Encounter: Payer: Self-pay | Admitting: Family Medicine

## 2017-01-26 ENCOUNTER — Encounter: Payer: Self-pay | Admitting: Physical Therapy

## 2017-01-26 DIAGNOSIS — M25551 Pain in right hip: Secondary | ICD-10-CM

## 2017-01-26 DIAGNOSIS — M6281 Muscle weakness (generalized): Secondary | ICD-10-CM

## 2017-01-26 DIAGNOSIS — R2689 Other abnormalities of gait and mobility: Secondary | ICD-10-CM

## 2017-01-26 NOTE — Therapy (Signed)
[facility] [address], [place], [place], [address] Phone: [phone]   Fax:  [phone]  Physical Therapy Treatment  Patient Details  Name: Holly Mcdonald MRN: [id number] Date of Birth: [birth year] Referring Provider: Martinique, Betty, MD  Encounter Date: 01/26/2017      PT End of Session - 01/26/17 0811    Visit Number 20   Date for PT Re-Evaluation 02/18/17   PT Start Time 0809   PT Stop Time 0900   PT Time Calculation (min) 51 min   Activity Tolerance Patient tolerated treatment well   Behavior During Therapy [facility] for tasks assessed/performed      Past Medical History:  Diagnosis Date  . Allergy   . Asthma   . Asthma in adult, unspecified asthma severity, uncomplicated 08/12/8183  . Bilateral pulmonary embolism ([place]) 11/03/2016  . Morbid obesity with BMI of 45.0-49.9, adult [facility])     Past Surgical History:  Procedure Laterality Date  . RECTAL EXAMINATION UNDER ANESTHESIA W/ CYSTOSCOPY    . TONSILLECTOMY    . TRANSTHORACIC ECHOCARDIOGRAM  11/04/2016    (In setting of bilateral PE) mild LVH. Systolic function was normal.   EF 55-60%. Mild LA dilation. (poor windows)    There were no vitals filed for this visit.      Subjective Assessment - 01/26/17 6314    Subjective MD reports current mucous is allergy related, otherwise pthad good check of her lungs. feeling ok this AM.   Currently in Pain? Yes   Pain Score 1    Pain Location Chest   Aggravating Factors  weather can increase but fairly constant   Pain Relieving Factors rest, meds   Multiple Pain Sites No            [facility] PT Assessment - 01/26/17 0001      Observation/Other Assessments   Focus on Therapeutic Outcomes (FOTO)  40% limitation                     [facility] Adult PT Treatment/Exercise - 01/26/17 0001      Lumbar Exercises: Aerobic   Elliptical L2 x 4:30   without stopping   Tread Mill 1.8-2.5 mph x 10 min   PTA monitored.     Knee/Hip Exercises: Standing   Heel Raises Both;2 sets;20 reps  3# overhead press concurrent   Hip Flexion Stengthening;Both;2 sets;20 reps;Knee straight   Hip Flexion Limitations 4#  3# overhead marches with UE   Hip Abduction Stengthening;Both;2 sets;20 reps;Knee straight  3# overhead press single arm at a time for balance   Abduction Limitations 4#   Hip Extension Stengthening;Both;2 sets;20 reps;Knee straight  Single arm overhead presses with 3#   Extension Limitations 4#                  PT Short Term Goals - 12/15/16 0817      PT SHORT TERM GOAL #4   Title walk for 5-10 minutes without fatigue or need to rest   Time 4   Period Weeks   Status Achieved           PT Long Term Goals - 01/24/17 9702      PT LONG TERM GOAL #1   Title be independent in advanced HEP   Time 6   Period Weeks     PT LONG TERM GOAL #6   Title demonstrate >or = to 4+/5 Rt hip strength and full A/ROM to improve functional mobility  Time 6   Period Weeks   Status On-going  depends on the day               Plan - 01/26/17 0811    Clinical Impression Statement Pt continues to increase her endurance both cardiovascularly and via muscle strength by increasing her time on the eliptical and speed on the treadmill. Pt closely monitored throughout session dut to her shortness of breathe.pt does appear to need shorter rest breaks to recover her breathing. Checked O2 sats and they were consistently above 98% during the session.   Rehab Potential Good   PT Frequency 2x / week   PT Duration 6 weeks   PT Treatment/Interventions ADLs/Self Care Home Management;Cryotherapy;Electrical Stimulation;Functional mobility training;Stair training;Ultrasound;Therapeutic activities;Moist Heat;Therapeutic exercise;Neuromuscular re-education;Patient/family education;Passive range of motion;Dry needling;Taping   PT Next Visit Plan Rt hip strength, focus on ER A/ROM, endurance  progression.  Work on adding time on elliptical and/or speed on treadmill.    Consulted and Agree with Plan of Care Patient      Patient will benefit from skilled therapeutic intervention in order to improve the following deficits and impairments:  Abnormal gait, Decreased range of motion, Increased muscle spasms, Decreased endurance, Cardiopulmonary status limiting activity, Decreased activity tolerance, Pain, Decreased strength, Impaired flexibility  Visit Diagnosis: Muscle weakness (generalized)  Pain in right hip  Other abnormalities of gait and mobility     Problem List Patient Active Problem List   Diagnosis Date Noted  . Anxiety disorder 01/24/2017  . Fibromyalgia muscle pain 01/24/2017  . DOE (dyspnea on exertion) 01/13/2017  . Chest pain with low risk for cardiac etiology 01/13/2017  . Family history of premature CAD 01/13/2017  . GERD (gastroesophageal reflux disease) 12/21/2016  . Vitamin D deficiency, unspecified 12/21/2016  . Iron deficiency anemia due to chronic blood loss 12/20/2016  . Asthma in adult, unspecified asthma severity, uncomplicated 28/76/8115  . Obesity, Class III, BMI 40-49.9 (morbid obesity) ([place]) 11/04/2016  . Pulmonary embolus ([place])   . Bilateral pulmonary embolism ([place]) 11/03/2016    Anaya Bovee,PTA 01/26/2017, 8:49 AM  [facility] [address], [place], [place], [address] Phone: [phone]   Fax:  [phone]  Name: Brelee Renk MRN: [id number] Date of Birth: January 01, 1976

## 2017-01-27 ENCOUNTER — Encounter: Payer: Self-pay | Admitting: Family Medicine

## 2017-01-31 ENCOUNTER — Encounter: Payer: Self-pay | Admitting: Physical Therapy

## 2017-01-31 ENCOUNTER — Ambulatory Visit: Payer: 59 | Admitting: Physical Therapy

## 2017-01-31 DIAGNOSIS — M25551 Pain in right hip: Secondary | ICD-10-CM

## 2017-01-31 DIAGNOSIS — M6281 Muscle weakness (generalized): Secondary | ICD-10-CM

## 2017-01-31 DIAGNOSIS — R2689 Other abnormalities of gait and mobility: Secondary | ICD-10-CM

## 2017-01-31 NOTE — Therapy (Signed)
[facility] [address] [place], [place], [address] Phone: [phone]   Fax:  [phone]  Physical Therapy Treatment  Patient Details  Name: Holly Mcdonald MRN: [id number] Date of Birth: [birth year] Referring Provider: Martinique, Betty, MD  Encounter Date: 01/31/2017      PT End of Session - 01/31/17 0806    Visit Number 21   Date for PT Re-Evaluation 02/18/17   PT Start Time 0805   PT Stop Time 0858   PT Time Calculation (min) 53 min   Activity Tolerance Patient tolerated treatment well   Behavior During Therapy [facility] for tasks assessed/performed      Past Medical History:  Diagnosis Date  . Allergy   . Asthma   . Asthma in adult, unspecified asthma severity, uncomplicated 09/14/3218  . Bilateral pulmonary embolism ([place]) 11/03/2016  . Morbid obesity with BMI of 45.0-49.9, adult [facility])     Past Surgical History:  Procedure Laterality Date  . RECTAL EXAMINATION UNDER ANESTHESIA W/ CYSTOSCOPY    . TONSILLECTOMY    . TRANSTHORACIC ECHOCARDIOGRAM  11/04/2016    (In setting of bilateral PE) mild LVH. Systolic function was normal.   EF 55-60%. Mild LA dilation. (poor windows)    There were no vitals filed for this visit.      Subjective Assessment - 01/31/17 0808    Subjective I rested this weekend. No pain in my hip this AM. I do have a headache this AM.    Currently in Pain? No/denies   Multiple Pain Sites No                         [facility] Adult PT Treatment/Exercise - 01/31/17 0001      Lumbar Exercises: Aerobic   Elliptical L2 x 5 min   No stopping today for rest, just slowed down   Tread Mill 68mph-2.7mph x 10 min  PTA present to monitor symptoms.     Knee/Hip Exercises: Stretches   Active Hamstring Stretch Both;2 reps;20 seconds   Piriformis Stretch Limitations Bil 3x 20 sec seated   Gastroc Stretch Both;2 reps;20 seconds     Knee/Hip Exercises: Standing   Heel Raises  Both;2 sets;20 reps  3# overhead press concurrent   Hip Flexion Stengthening;Both;2 sets;20 reps;Knee straight   Hip Flexion Limitations 4#  3# overhead marches with UE   Hip Abduction Stengthening;Both;2 sets;20 reps;Knee straight  3# overhead press single arm at a time for balance   Abduction Limitations 4#   Hip Extension Stengthening;Both;2 sets;20 reps;Knee straight  Single arm overhead presses with 3#   Extension Limitations 4#                  PT Short Term Goals - 12/15/16 0817      PT SHORT TERM GOAL #4   Title walk for 5-10 minutes without fatigue or need to rest   Time 4   Period Weeks   Status Achieved           PT Long Term Goals - 01/24/17 2542      PT LONG TERM GOAL #1   Title be independent in advanced HEP   Time 6   Period Weeks     PT LONG TERM GOAL #6   Title demonstrate >or = to 4+/5 Rt hip strength and full A/ROM to improve functional mobility   Time 6   Period Weeks   Status On-going  depends on the day  Plan - 01/31/17 0807    Clinical Impression Statement Pt completed 5 min on the eliptical today without stopping, only slowing down to get her heart rate down. She reports her hip pain is 75% improved.  Pt continues to work on her speed on the treadmill and plan to complete the 6 min walk test on Wednesday.    Rehab Potential Good   PT Frequency 2x / week   PT Duration 6 weeks   PT Treatment/Interventions ADLs/Self Care Home Management;Cryotherapy;Electrical Stimulation;Functional mobility training;Stair training;Ultrasound;Therapeutic activities;Moist Heat;Therapeutic exercise;Neuromuscular re-education;Patient/family education;Passive range of motion;Dry needling;Taping   PT Next Visit Plan 6 min walk test for goals next visit   Consulted and Agree with Plan of Care --      Patient will benefit from skilled therapeutic intervention in order to improve the following deficits and impairments:  Abnormal gait,  Decreased range of motion, Increased muscle spasms, Decreased endurance, Cardiopulmonary status limiting activity, Decreased activity tolerance, Pain, Decreased strength, Impaired flexibility  Visit Diagnosis: Muscle weakness (generalized)  Pain in right hip  Other abnormalities of gait and mobility     Problem List Patient Active Problem List   Diagnosis Date Noted  . Anxiety disorder 01/24/2017  . Fibromyalgia muscle pain 01/24/2017  . DOE (dyspnea on exertion) 01/13/2017  . Chest pain with low risk for cardiac etiology 01/13/2017  . Family history of premature CAD 01/13/2017  . GERD (gastroesophageal reflux disease) 12/21/2016  . Vitamin D deficiency, unspecified 12/21/2016  . Iron deficiency anemia due to chronic blood loss 12/20/2016  . Asthma in adult, unspecified asthma severity, uncomplicated 56/43/3295  . Obesity, Class III, BMI 40-49.9 (morbid obesity) ([place]) 11/04/2016  . Pulmonary embolus ([place])   . Bilateral pulmonary embolism ([place]) 11/03/2016    Holly Mcdonald, PTA 01/31/2017, 8:55 AM  [facility] [address] [place], [place], [address] Phone: [phone]   Fax:  [phone]  Name: Holly Mcdonald MRN: [id number] Date of Birth: 1975/05/27

## 2017-02-02 ENCOUNTER — Ambulatory Visit: Payer: 59

## 2017-02-02 DIAGNOSIS — M25551 Pain in right hip: Secondary | ICD-10-CM

## 2017-02-02 DIAGNOSIS — M6281 Muscle weakness (generalized): Secondary | ICD-10-CM

## 2017-02-02 DIAGNOSIS — R2689 Other abnormalities of gait and mobility: Secondary | ICD-10-CM

## 2017-02-02 NOTE — Therapy (Signed)
[facility] [address] [place], [place], [address] Phone: [phone]   Fax:  [phone]  Physical Therapy Treatment  Patient Details  Name: Holly Mcdonald MRN: [id number] Date of Birth: 1975-05-26 Referring Provider: Martinique, Betty, MD  Encounter Date: 02/02/2017      PT End of Session - 02/02/17 0843    Visit Number 22   Date for PT Re-Evaluation 02/18/17   PT Start Time 0815   PT Stop Time 9326   PT Time Calculation (min) 42 min   Activity Tolerance Patient tolerated treatment well   Behavior During Therapy [facility] for tasks assessed/performed      Past Medical History:  Diagnosis Date  . Allergy   . Asthma   . Asthma in adult, unspecified asthma severity, uncomplicated 11/07/2456  . Bilateral pulmonary embolism ([place]) 11/03/2016  . Morbid obesity with BMI of 45.0-49.9, adult [facility])     Past Surgical History:  Procedure Laterality Date  . RECTAL EXAMINATION UNDER ANESTHESIA W/ CYSTOSCOPY    . TONSILLECTOMY    . TRANSTHORACIC ECHOCARDIOGRAM  11/04/2016    (In setting of bilateral PE) mild LVH. Systolic function was normal.   EF 55-60%. Mild LA dilation. (poor windows)    There were no vitals filed for this visit.      Subjective Assessment - 02/02/17 0814    Subjective I overslept.  Pt 15 minutes late for appointment.     Patient Stated Goals increase endurance, reduce Rt hip pain   Currently in Pain? No/denies                         [facility] Adult PT Treatment/Exercise - 02/02/17 0001      Lumbar Exercises: Aerobic   Elliptical L2 x 5 min   No stopping today for rest, just slowed down   Tread Mill 41mph-2.7mph x 10 min  PTA present to monitor symptoms.     Knee/Hip Exercises: Stretches   Active Hamstring Stretch Both;2 reps;20 seconds   Piriformis Stretch Limitations Bil 3x 20 sec seated   Gastroc Stretch Both;2 reps;20 seconds     Knee/Hip Exercises: Standing   Heel  Raises Both;2 sets;20 reps  3# overhead press concurrent   Hip Flexion Stengthening;Both;2 sets;20 reps;Knee straight   Hip Flexion Limitations 4#  3# overhead marches with UE   Hip Abduction Stengthening;Both;2 sets;20 reps;Knee straight  3# overhead press single arm at a time for balance   Abduction Limitations 4#   Hip Extension Stengthening;Both;2 sets;20 reps;Knee straight  Single arm overhead presses with 3#   Extension Limitations 4#                  PT Short Term Goals - 12/15/16 0817      PT SHORT TERM GOAL #4   Title walk for 5-10 minutes without fatigue or need to rest   Time 4   Period Weeks   Status Achieved           PT Long Term Goals - 02/02/17 0998      PT LONG TERM GOAL #1   Title be independent in advanced HEP   Time 6   Period Weeks   Status On-going     PT LONG TERM GOAL #2   Baseline 40% limitation   Time 6   Period Weeks   Status On-going     PT LONG TERM GOAL #3   Title get in and out of  bed without UE assistance with Rt LE   Status Achieved     PT LONG TERM GOAL #4   Title improve cardiovascular endurance to walk for 15-20 minutes without rest   Baseline 15 minutes without rest   Status Achieved     PT LONG TERM GOAL #5   Title report < or = to 3/10 Rt LE pain with standing and walking   Baseline this varies   Time 6   Period Weeks   Status On-going               Plan - 02/02/17 7619    Clinical Impression Statement Pt completed 5 minutes and 30 seconds on the elliptical today without rest.  Pt is able to walk for 15 minutes without need for rest breaks now.  Pt got a hip and lumbar spasm during exercise today.  Pt reports that her hip pain is 75% improved..  Pt was late today so we will complete the 6 minute walk test next visit.  Pt will continue to benefit from skilled PT for endurance, strength and flexibility.     PT Frequency 2x / week   PT Duration 6 weeks   PT Treatment/Interventions ADLs/Self Care Home  Management;Cryotherapy;Electrical Stimulation;Functional mobility training;Stair training;Ultrasound;Therapeutic activities;Moist Heat;Therapeutic exercise;Neuromuscular re-education;Patient/family education;Passive range of motion;Dry needling;Taping   PT Next Visit Plan 6 min walk test for goals next visit, continue to challenge endurance and strength.     Consulted and Agree with Plan of Care Patient      Patient will benefit from skilled therapeutic intervention in order to improve the following deficits and impairments:  Abnormal gait, Decreased range of motion, Increased muscle spasms, Decreased endurance, Cardiopulmonary status limiting activity, Decreased activity tolerance, Pain, Decreased strength, Impaired flexibility  Visit Diagnosis: Muscle weakness (generalized)  Pain in right hip  Other abnormalities of gait and mobility     Problem List Patient Active Problem List   Diagnosis Date Noted  . Anxiety disorder 01/24/2017  . Fibromyalgia muscle pain 01/24/2017  . DOE (dyspnea on exertion) 01/13/2017  . Chest pain with low risk for cardiac etiology 01/13/2017  . Family history of premature CAD 01/13/2017  . GERD (gastroesophageal reflux disease) 12/21/2016  . Vitamin D deficiency, unspecified 12/21/2016  . Iron deficiency anemia due to chronic blood loss 12/20/2016  . Asthma in adult, unspecified asthma severity, uncomplicated 50/93/2671  . Obesity, Class III, BMI 40-49.9 (morbid obesity) ([place]) 11/04/2016  . Pulmonary embolus ([place])   . Bilateral pulmonary embolism ([place]) 11/03/2016     Sigurd Sos, PT 02/02/17 8:44 AM  [facility] [address] [place], [place], [address] Phone: [phone]   Fax:  [phone]  Name: Adra Shepler MRN: [id number] Date of Birth: October 21, 1975

## 2017-02-04 ENCOUNTER — Other Ambulatory Visit: Payer: Self-pay | Admitting: Family Medicine

## 2017-02-04 DIAGNOSIS — I2699 Other pulmonary embolism without acute cor pulmonale: Secondary | ICD-10-CM

## 2017-02-07 ENCOUNTER — Ambulatory Visit: Payer: 59 | Attending: Family Medicine | Admitting: Physical Therapy

## 2017-02-07 ENCOUNTER — Encounter: Payer: Self-pay | Admitting: Physical Therapy

## 2017-02-07 DIAGNOSIS — M25551 Pain in right hip: Secondary | ICD-10-CM | POA: Diagnosis present

## 2017-02-07 DIAGNOSIS — R2689 Other abnormalities of gait and mobility: Secondary | ICD-10-CM | POA: Diagnosis present

## 2017-02-07 DIAGNOSIS — M6281 Muscle weakness (generalized): Secondary | ICD-10-CM | POA: Insufficient documentation

## 2017-02-07 NOTE — Therapy (Signed)
[facility] [address] [place], [place], [address] Phone: [phone]   Fax:  [phone]  Physical Therapy Treatment  Patient Details  Name: Holly Mcdonald MRN: [id number] Date of Birth: [birth year] Referring Provider: Martinique, Betty, MD  Encounter Date: 02/07/2017      PT End of Session - 02/07/17 0814    Visit Number 23   Date for PT Re-Evaluation 02/18/17   PT Start Time 0814   PT Stop Time 0853   PT Time Calculation (min) 39 min   Activity Tolerance Patient tolerated treatment well   Behavior During Therapy [facility] for tasks assessed/performed      Past Medical History:  Diagnosis Date  . Allergy   . Asthma   . Asthma in adult, unspecified asthma severity, uncomplicated 10/11/1581  . Bilateral pulmonary embolism ([place]) 11/03/2016  . Morbid obesity with BMI of 45.0-49.9, adult [facility])     Past Surgical History:  Procedure Laterality Date  . RECTAL EXAMINATION UNDER ANESTHESIA W/ CYSTOSCOPY    . TONSILLECTOMY    . TRANSTHORACIC ECHOCARDIOGRAM  11/04/2016    (In setting of bilateral PE) mild LVH. Systolic function was normal.   EF 55-60%. Mild LA dilation. (poor windows)    There were no vitals filed for this visit.      Subjective Assessment - 02/07/17 0815    Subjective Late again, no new complaints this AM.   Pertinent History pulmonary embolism 11/02/16 with reduced lung capacity/endurance. Pt attempted work last week. She only made 2 calls before going home.    Currently in Pain? --  LT shoulder 2/10, sore   Aggravating Factors  Pt has fibromyalgia   Pain Relieving Factors rest, meds   Multiple Pain Sites No            [facility] PT Assessment - 02/07/17 0001      6 Minute walk- Post Test   Modified Borg Scale for Dyspnea 5- Strong or hard breathing     6 minute walk test results    Aerobic Endurance Distance Walked 1480  feet                     [facility] Adult PT  Treatment/Exercise - 02/07/17 0001      Lumbar Exercises: Aerobic   Elliptical L2 x 6 min     Knee/Hip Exercises: Standing   Heel Raises Both;2 sets;20 reps  3# overhead press concurrent   Hip Flexion Stengthening;Both;2 sets;20 reps;Knee straight   Hip Flexion Limitations 4#  3# overhead marches with UE   Hip Abduction Stengthening;Both;2 sets;20 reps;Knee straight  3# overhead press single arm at a time for balance   Abduction Limitations 4#   Hip Extension Stengthening;Both;2 sets;20 reps;Knee straight  Single arm overhead presses with 3#   Extension Limitations 4#   Other Standing Knee Exercises side stepping with green band  door to door  gave pt green band for home resistance progression: 3x                  PT Short Term Goals - 12/15/16 0817      PT SHORT TERM GOAL #4   Title walk for 5-10 minutes without fatigue or need to rest   Time 4   Period Weeks   Status Achieved           PT Long Term Goals - 02/07/17 0940      PT LONG TERM GOAL #1   Title be  independent in advanced HEP   Time 6   Period Weeks   Status On-going     PT LONG TERM GOAL #7   Title walk 1350 feet in 6 minutes with 2 dyspnea Borg scale   Time 6   Period Weeks   Status Partially Met  1480 feet: dyspnea 5/10               Plan - 02/07/17 0814    Clinical Impression Statement Pt completed 1480 feet during the 6 min walk test today. She partially met the long term goal by meeting the distance but her Borg rating was a 5. Progressed her home resisted side stepping to the green band as well as some monster walking in addion to side stepping.   Rehab Potential Good   PT Frequency 2x / week   PT Duration 6 weeks   PT Treatment/Interventions ADLs/Self Care Home Management;Cryotherapy;Electrical Stimulation;Functional mobility training;Stair training;Ultrasound;Therapeutic activities;Moist Heat;Therapeutic exercise;Neuromuscular re-education;Patient/family education;Passive  range of motion;Dry needling;Taping   PT Next Visit Plan  Continue to challenge endurance and strength.     Consulted and Agree with Plan of Care Patient      Patient will benefit from skilled therapeutic intervention in order to improve the following deficits and impairments:  Abnormal gait, Decreased range of motion, Increased muscle spasms, Decreased endurance, Cardiopulmonary status limiting activity, Decreased activity tolerance, Pain, Decreased strength, Impaired flexibility  Visit Diagnosis: Muscle weakness (generalized)  Pain in right hip  Other abnormalities of gait and mobility     Problem List Patient Active Problem List   Diagnosis Date Noted  . Anxiety disorder 01/24/2017  . Fibromyalgia muscle pain 01/24/2017  . DOE (dyspnea on exertion) 01/13/2017  . Chest pain with low risk for cardiac etiology 01/13/2017  . Family history of premature CAD 01/13/2017  . GERD (gastroesophageal reflux disease) 12/21/2016  . Vitamin D deficiency, unspecified 12/21/2016  . Iron deficiency anemia due to chronic blood loss 12/20/2016  . Asthma in adult, unspecified asthma severity, uncomplicated 11/15/2016  . Obesity, Class III, BMI 40-49.9 (morbid obesity) ([facility]) 11/04/2016  . Pulmonary embolus ([facility])   . Bilateral pulmonary embolism ([facility]) 11/03/2016    ,, PTA 02/07/2017, 8:53 AM  [facility] [address] [place], [place], [address] Phone: [phone]   Fax:  [phone]  Name: Munira Terry Mittelman MRN: [id number] Date of Birth: 05/30/1975   

## 2017-02-09 ENCOUNTER — Ambulatory Visit: Payer: 59

## 2017-02-09 DIAGNOSIS — M25551 Pain in right hip: Secondary | ICD-10-CM

## 2017-02-09 DIAGNOSIS — R2689 Other abnormalities of gait and mobility: Secondary | ICD-10-CM

## 2017-02-09 DIAGNOSIS — M6281 Muscle weakness (generalized): Secondary | ICD-10-CM | POA: Diagnosis not present

## 2017-02-09 NOTE — Therapy (Signed)
[facility] [address] [place], [place], [address] Phone: [phone]   Fax:  [phone]  Physical Therapy Treatment  Patient Details  Name: Holly Mcdonald MRN: [id number] Date of Birth: 01-05-1976 Referring Provider: Martinique, Betty, MD  Encounter Date: 02/09/2017      PT End of Session - 02/09/17 0842    Visit Number 24   Date for PT Re-Evaluation 02/18/17   PT Start Time 0811   PT Stop Time 0852   PT Time Calculation (min) 41 min   Activity Tolerance Patient tolerated treatment well   Behavior During Therapy [facility] for tasks assessed/performed      Past Medical History:  Diagnosis Date  . Allergy   . Asthma   . Asthma in adult, unspecified asthma severity, uncomplicated 09/09/9765  . Bilateral pulmonary embolism ([place]) 11/03/2016  . Morbid obesity with BMI of 45.0-49.9, adult [facility])     Past Surgical History:  Procedure Laterality Date  . RECTAL EXAMINATION UNDER ANESTHESIA W/ CYSTOSCOPY    . TONSILLECTOMY    . TRANSTHORACIC ECHOCARDIOGRAM  11/04/2016    (In setting of bilateral PE) mild LVH. Systolic function was normal.   EF 55-60%. Mild LA dilation. (poor windows)    There were no vitals filed for this visit.      Subjective Assessment - 02/09/17 0812    Subjective 10 minutes late.  "I was able to put on my sock the right way"     Currently in Pain? No/denies                         [facility] Adult PT Treatment/Exercise - 02/09/17 0001      Lumbar Exercises: Aerobic   Elliptical L2 x 6.5 min   Tread Mill 13mph-2.7mph x 10 min  PT present to monitor symptoms.     Knee/Hip Exercises: Standing   Heel Raises Both;2 sets;20 reps  3# overhead press concurrent   Hip Flexion Stengthening;Both;2 sets;20 reps;Knee straight   Hip Flexion Limitations 5#   Hip Abduction Stengthening;Both;2 sets;20 reps;Knee straight  3# overhead press single arm at a time for balance   Abduction  Limitations 5#   Hip Extension Stengthening;Both;2 sets;20 reps;Knee straight   Extension Limitations 5#   Other Standing Knee Exercises side stepping with green band  door to door  gave pt green band for home resistance progression: 3x                  PT Short Term Goals - 12/15/16 0817      PT SHORT TERM GOAL #4   Title walk for 5-10 minutes without fatigue or need to rest   Time 4   Period Weeks   Status Achieved           PT Long Term Goals - 02/09/17 0813      PT LONG TERM GOAL #1   Title be independent in advanced HEP   Time 6   Period Weeks   Status On-going               Plan - 02/09/17 3419    Clinical Impression Statement Pt with improved distance with 6 minute walk test this week (1480 feet).  Borg rating was 5 after this activity.  HEP was progressed this week and resistance with hip exercises were advanced today.  Pt is making steady progress and is on track to D/C to HEP by 02/18/17.   Rehab  Potential Good   PT Frequency 2x / week   PT Duration 6 weeks   PT Treatment/Interventions ADLs/Self Care Home Management;Cryotherapy;Electrical Stimulation;Functional mobility training;Stair training;Ultrasound;Therapeutic activities;Moist Heat;Therapeutic exercise;Neuromuscular re-education;Patient/family education;Passive range of motion;Dry needling;Taping   PT Next Visit Plan  Continue to challenge endurance and strength.  Probable D/C to HEP by 02/18/17   Consulted and Agree with Plan of Care Patient      Patient will benefit from skilled therapeutic intervention in order to improve the following deficits and impairments:  Abnormal gait, Decreased range of motion, Increased muscle spasms, Decreased endurance, Cardiopulmonary status limiting activity, Decreased activity tolerance, Pain, Decreased strength, Impaired flexibility  Visit Diagnosis: Muscle weakness (generalized)  Pain in right hip  Other abnormalities of gait and  mobility     Problem List Patient Active Problem List   Diagnosis Date Noted  . Anxiety disorder 01/24/2017  . Fibromyalgia muscle pain 01/24/2017  . DOE (dyspnea on exertion) 01/13/2017  . Chest pain with low risk for cardiac etiology 01/13/2017  . Family history of premature CAD 01/13/2017  . GERD (gastroesophageal reflux disease) 12/21/2016  . Vitamin D deficiency, unspecified 12/21/2016  . Iron deficiency anemia due to chronic blood loss 12/20/2016  . Asthma in adult, unspecified asthma severity, uncomplicated 91/63/8466  . Obesity, Class III, BMI 40-49.9 (morbid obesity) ([place]) 11/04/2016  . Pulmonary embolus ([place])   . Bilateral pulmonary embolism ([place]) 11/03/2016    Sigurd Sos, PT 02/09/17 8:44 AM  [facility] [address] [place], [place], [address] Phone: [phone]   Fax:  [phone]  Name: Holly Mcdonald MRN: [id number] Date of Birth: 01-13-1976

## 2017-02-10 ENCOUNTER — Encounter: Payer: Self-pay | Admitting: Family Medicine

## 2017-02-13 ENCOUNTER — Encounter (HOSPITAL_COMMUNITY): Payer: Self-pay | Admitting: Emergency Medicine

## 2017-02-13 ENCOUNTER — Emergency Department (HOSPITAL_COMMUNITY): Payer: 59

## 2017-02-13 ENCOUNTER — Encounter: Payer: Self-pay | Admitting: Family Medicine

## 2017-02-13 ENCOUNTER — Emergency Department (HOSPITAL_COMMUNITY)
Admission: EM | Admit: 2017-02-13 | Discharge: 2017-02-13 | Disposition: A | Discharge status: Home / Self Care | Payer: 59 | Attending: Emergency Medicine | Admitting: Emergency Medicine

## 2017-02-13 DIAGNOSIS — R079 Chest pain, unspecified: Secondary | ICD-10-CM | POA: Diagnosis present

## 2017-02-13 DIAGNOSIS — R059 Cough, unspecified: Secondary | ICD-10-CM

## 2017-02-13 DIAGNOSIS — J40 Bronchitis, not specified as acute or chronic: Secondary | ICD-10-CM | POA: Insufficient documentation

## 2017-02-13 DIAGNOSIS — Z79899 Other long term (current) drug therapy: Secondary | ICD-10-CM | POA: Diagnosis not present

## 2017-02-13 DIAGNOSIS — Z7901 Long term (current) use of anticoagulants: Secondary | ICD-10-CM | POA: Insufficient documentation

## 2017-02-13 DIAGNOSIS — R05 Cough: Secondary | ICD-10-CM

## 2017-02-13 LAB — CBC
HEMATOCRIT: 40.3 % (ref 36.0–46.0)
HEMOGLOBIN: 13 g/dL (ref 12.0–15.0)
MCH: 27.7 pg (ref 26.0–34.0)
MCHC: 32.3 g/dL (ref 30.0–36.0)
MCV: 85.9 fL (ref 78.0–100.0)
Platelets: 249 10*3/uL (ref 150–400)
RBC: 4.69 MIL/uL (ref 3.87–5.11)
RDW: 14.7 % (ref 11.5–15.5)
WBC: 8.3 10*3/uL (ref 4.0–10.5)

## 2017-02-13 LAB — BASIC METABOLIC PANEL
ANION GAP: 7 (ref 5–15)
BUN: 8 mg/dL (ref 6–20)
CHLORIDE: 106 mmol/L (ref 101–111)
CO2: 24 mmol/L (ref 22–32)
Calcium: 9.1 mg/dL (ref 8.9–10.3)
Creatinine, Ser: 0.67 mg/dL (ref 0.44–1.00)
GFR calc Af Amer: >60 mL/min (ref >60)
Glucose, Bld: 105 mg/dL — ABNORMAL HIGH (ref 65–99)
POTASSIUM: 3.9 mmol/L (ref 3.5–5.1)
SODIUM: 137 mmol/L (ref 135–145)

## 2017-02-13 LAB — I-STAT TROPONIN, ED: Troponin i, poc: 0 ng/mL (ref 0.00–0.08)

## 2017-02-13 MED ORDER — AZITHROMYCIN 250 MG PO TABS: ORAL_TABLET | ORAL | 0 refills | Status: DC

## 2017-02-13 MED ORDER — BENZONATATE 100 MG PO CAPS: 100.0000 mg | ORAL_CAPSULE | Freq: Three times a day (TID) | ORAL | 0 refills | Status: DC

## 2017-02-13 NOTE — Discharge Instructions (Signed)
Your workup is reassuring, you likely have bronchitis which is most often viral, if symptoms do not improve over the next few days you can start the Z-pak. You can use sudafed and tessalon Perles for symptomatic treatment. Return to ED if you develop worsening shortness of breath, fevers, chills, worsening chest pain or are coughing up blood, otherwise follow up with your primary doctor.

## 2017-02-13 NOTE — ED Triage Notes (Addendum)
Pt very anxious, talking rapidly. Staets "I came here instead of urgent care bc of my hx of PE in July, I am on eloquious, I think I have bronchitis bc I coughed up yellow mucous this morning. I have had chest pain since my PE diagnosis. I know yall need an EKG and XRAY." Pt unable to answer my question on if this is any new pain since July. Pt states "I have bronchitis, I am here for bronchitis, go ahead and get my EKG." Pain 2/10. Pt now states she always coughs up mucous. It is just normally clear. Difficult to assess pt and get straight forward answers for triage.  Pt BP 128/87, states"yep im sick because my BP is high" BP WNL

## 2017-02-13 NOTE — ED Notes (Signed)
Patient transported to X-ray 

## 2017-02-13 NOTE — ED Provider Notes (Signed)
[place] DEPT Provider Note   CSN: [id number] Arrival date & time: 02/13/17  1117     History   Chief Complaint Chief Complaint  Patient presents with  . Chest Pain    HPI  Holly Mcdonald is a 41 y.o. female with a history of allergies, asthma, PE in June 2018, and fibromyalgia, who presents today complaining of productive cough with yellowish green mucus this morning. Patient reports she usually has a cough with clear mucus intermittently at baseline but has had some increased nasal drainage, scratchy throat and today started having yellowish green sputum, no blood streaks or hemoptysis. Patient denies any fevers or chills at home. Patient was going to go to urgent care but given her history of PE she thought it would be best for her to come here. Patient takes Eliquis since her PE, reports missing 1 dose last night, no other missed doses. Patient reports intermittent right-sided chest pain related to her fibromyalgia at baseline, this is unchanged, no new pain in the chest. Patient denies any new shortness of breath. Denies new lower extremity swelling, reports sometimes she has some swelling on the right side. Patient reports she has pain everywhere when she is touched her fibromyalgia, but denies any new pain she is noticed in her lower legs. Patient denies nausea vomiting, abdominal pain.        Past Medical History:  Diagnosis Date  . Allergy   . Asthma   . Asthma in adult, unspecified asthma severity, uncomplicated 07/15/1694  . Bilateral pulmonary embolism ([place]) 11/03/2016  . Morbid obesity with BMI of 45.0-49.9, adult [facility])     Patient Active Problem List   Diagnosis Date Noted  . Anxiety disorder 01/24/2017  . Fibromyalgia muscle pain 01/24/2017  . DOE (dyspnea on exertion) 01/13/2017  . Chest pain with low risk for cardiac etiology 01/13/2017  . Family history of premature CAD 01/13/2017  . GERD (gastroesophageal reflux disease) 12/21/2016  . Vitamin D  deficiency, unspecified 12/21/2016  . Iron deficiency anemia due to chronic blood loss 12/20/2016  . Asthma in adult, unspecified asthma severity, uncomplicated 78/93/8101  . Obesity, Class III, BMI 40-49.9 (morbid obesity) ([place]) 11/04/2016  . Pulmonary embolus ([place])   . Bilateral pulmonary embolism ([place]) 11/03/2016    Past Surgical History:  Procedure Laterality Date  . RECTAL EXAMINATION UNDER ANESTHESIA W/ CYSTOSCOPY    . TONSILLECTOMY    . TRANSTHORACIC ECHOCARDIOGRAM  11/04/2016    (In setting of bilateral PE) mild LVH. Systolic function was normal.   EF 55-60%. Mild LA dilation. (poor windows)    OB History    No data available       Home Medications    Prior to Admission medications   Medication Sig Start Date End Date Taking? Authorizing Provider  acetaminophen (TYLENOL) 650 MG CR tablet Take 650 mg by mouth every 8 (eight) hours as needed for pain.    [provider]  albuterol (PROVENTIL HFA;VENTOLIN HFA) 108 (90 Base) MCG/ACT inhaler Inhale 1-2 puffs into the lungs every 6 (six) hours as needed for wheezing or shortness of breath. 12/07/16   Martinique, Betty G, MD  albuterol (PROVENTIL) (2.5 MG/3ML) 0.083% nebulizer solution Take 3 mLs (2.5 mg total) by nebulization every 6 (six) hours as needed for wheezing or shortness of breath. 12/21/16   Rigoberto Noel, MD  calcium carbonate (TUMS - DOSED IN MG ELEMENTAL CALCIUM) 500 MG chewable tablet Chew 1 tablet by mouth daily as needed.     [provider]  cetirizine (ZYRTEC) 10 MG tablet Take 10 mg by mouth daily.    [provider]  DULoxetine (CYMBALTA) 60 MG capsule Take 1 capsule (60 mg total) by mouth daily. 01/25/17   Martinique, Betty G, MD  ELIQUIS 5 MG TABS tablet TAKE 1 TABLET(5 MG) BY MOUTH TWICE DAILY 02/07/17   Martinique, Betty G, MD  fluticasone [facility]) 50 MCG/ACT nasal spray Place 1 spray into both nostrils daily. 01/07/17   Martin[place], Betty G, MD  Fluticasone-Salmeterol (ADVAIR DISKUS) 250-50  MCG/DOSE AEPB Inhale 1 puff into the lungs 2 (two) times daily. 12/07/16   Martinique, Betty G, MD  methocarbamol (ROBAXIN) 500 MG tablet Take 1 tablet (500 mg total) by mouth every 8 (eight) hours as needed for muscle spasms. 12/21/16   Martinique, Betty G, MD  omeprazole (PRILOSEC) 40 MG capsule Take 1 capsule (40 mg total) by mouth daily. 12/21/16   [place]ique, Betty G, MD  oxybutynin (DITROPAN) 5 MG tablet Take 1 tablet by mouth 2 (two) times daily. 12/28/16   [provider]  predniSONE (STERAPRED UNI-PAK 21 TAB) 10 MG (21) TBPK tablet Take 6 tabs on day 1, 5 tabs on day 2, 4 tabs on day 3, 3 tabs on day 4, 2 tabs on day 5, and 1 tab on day 6. 12/16/16   Joy, Shawn C, PA-C  timolol (TIMOPTIC) 0.5 % ophthalmic solution Place 1 drop into both eyes every morning.    [provider]  Vitamin D, Ergocalciferol, (DRISDOL) 50000 units CAPS capsule 1 cap weekly for 8 weeks then every 2 weeks. 12/26/16   Martinique, Betty G, MD    Family History Family History  Problem Relation Age of Onset  . Pulmonary embolism Mother        Last year  . Mental retardation Mother        PTSD and fibromyalgia  . Heart disease Mother        CHF  . Congestive Heart Failure Mother   . Heart disease Father 32       CAD  . Hyperlipidemia Father   . Diabetes Paternal Grandmother   . Heart attack Paternal Grandmother   . Congestive Heart Failure Maternal Aunt     Social History Social History  Substance Use Topics  . Smoking status: Never Smoker  . Smokeless tobacco: Never Used  . Alcohol use Yes     Allergies   Other   Review of Systems Review of Systems  Constitutional: Negative for chills and fever.  HENT: Positive for congestion and postnasal drip. Negative for sore throat.   Eyes: Negative for pain and discharge.  Respiratory: Positive for cough. Negative for chest tightness, shortness of breath and wheezing.   Cardiovascular: Negative for chest pain, palpitations and leg swelling.    Gastrointestinal: Negative for abdominal pain, nausea and vomiting.  Genitourinary: Negative for dysuria.  Musculoskeletal: Negative for arthralgias and myalgias.  Skin: Negative for color change and pallor.  Neurological: Negative for dizziness, weakness and light-headedness.     Physical Exam Updated Vital Signs BP 128/87   Pulse 94   Temp 97.9 F (36.6 C)   Resp 18   LMP 02/02/2017 (Approximate)   SpO2 99%   Physical Exam  Constitutional: She appears well-developed and well-nourished. No distress.  HENT:  Head: Normocephalic and atraumatic.  TMs clear with good landmarks, moderate nasal mucosa edema with clear rhinorrhea, posterior oropharynx clear with minimal erythema, no edema or exudates   Eyes: Right eye exhibits no  discharge. Left eye exhibits no discharge.  Neck: Normal range of motion. Neck supple.  Cardiovascular: Normal rate, regular rhythm, normal heart sounds and intact distal pulses.   Pulmonary/Chest: Effort normal and breath sounds normal. No respiratory distress. She has no wheezes. She has no rales.  Abdominal: Soft. Bowel sounds are normal. She exhibits no distension. There is no tenderness. There is no guarding.  Musculoskeletal: She exhibits no edema or deformity.  Neurological: She is alert. Coordination normal.  Skin: Skin is warm and dry. Capillary refill takes less than 2 seconds. She is not diaphoretic.  Psychiatric: She has a normal mood and affect. Her behavior is normal.  Nursing note and vitals reviewed.    ED Treatments / Results  Labs (all labs ordered are listed, but only abnormal results are displayed) Labs Reviewed  BASIC METABOLIC PANEL - Abnormal; Notable for the following:       Result Value   Glucose, Bld 105 (*)    All other components within normal limits  CBC  I-STAT TROPONIN, ED    EKG  EKG Interpretation  Date/Time:  Sunday February 13 2017 11:24:55 EDT Ventricular Rate:  85 PR Interval:  156 QRS Duration: 70 QT  Interval:  352 QTC Calculation: 418 R Axis:   -23 Text Interpretation:  Normal sinus rhythm with sinus arrhythmia Abnormal ECG No significant change since last tracing Confirmed by Duffy Bruce [phone]) on 02/13/2017 11:51:47 AM       Radiology Dg Chest 2 View  Result Date: 02/13/2017 CLINICAL DATA:  Cough. EXAM: CHEST  2 VIEW COMPARISON:  Chest x-ray dated December 16, 2016. FINDINGS: The heart size and mediastinal contours are within normal limits. Both lungs are clear. The visualized skeletal structures are unremarkable. IMPRESSION: No active cardiopulmonary disease. Electronically Signed   By: Titus Dubin M.D.   On: 02/13/2017 12:01    Procedures Procedures (including critical care time)  Medications Ordered in ED Medications - No data to display   Initial Impression / Assessment and Plan / ED Course  I have reviewed the triage vital signs and the nursing notes.  Pertinent labs & imaging results that were available during my care of the patient were reviewed by me and considered in my medical decision making (see chart for details).  Pt presents with productive cough. Vitals normal, pt afebrile and well-appearing, no respiratory distress. Pt has hx of PE and is currently on eliquis, pt denies shortness of breath or CP that is new or different from the occasional CP she has with her fibromyalgia. No leg swelling. CXR shows no acute cardiopulmonary disease, EKG unchanged from previous, trop negative and labs unremarkable. Presentation consistent with bronchitis, no concern for PE or DVT today, pt to continue Eliquis. Pt stable for discharge home, pt very concerned about lung health, Z-pak provided, instructed pt to wait and see if symptoms improve, before starting ABX, Tessalon Perles provided for cough. Pt to follow up with PCP, return precautions discussed. Pt expresses understanding and agrees with plan.   Final Clinical Impressions(s) / ED Diagnoses   Final diagnoses:  Cough    Chest pain, unspecified type  Bronchitis    New Prescriptions Discharge Medication List as of 02/13/2017 12:23 PM    START taking these medications   Details  azithromycin (ZITHROMAX Z-PAK) 250 MG tablet 2 po day one, then 1 daily x 4 days, Print    benzonatate (TESSALON) 100 MG capsule Take 1 capsule (100 mg total) by mouth every 8 (eight) hours., Starting  Nancy Fetter 02/13/2017, Print         Jacqlyn Larsen, PA-C 02/14/17 1612    Duffy Bruce, MD 02/14/17 2118

## 2017-02-14 ENCOUNTER — Ambulatory Visit: Payer: 59

## 2017-02-14 DIAGNOSIS — R2689 Other abnormalities of gait and mobility: Secondary | ICD-10-CM

## 2017-02-14 DIAGNOSIS — M6281 Muscle weakness (generalized): Secondary | ICD-10-CM | POA: Diagnosis not present

## 2017-02-14 DIAGNOSIS — M25551 Pain in right hip: Secondary | ICD-10-CM

## 2017-02-14 NOTE — Therapy (Signed)
[facility] [address], [place], [place], [address] Phone: [phone]   Fax:  [phone]  Physical Therapy Treatment  Patient Details  Name: Holly Mcdonald MRN: [id number] Date of Birth: 03/31/1976 Referring Provider: Martinique, Betty, MD  Encounter Date: 02/14/2017      PT End of Session - 02/14/17 0830    Visit Number 25   Date for PT Re-Evaluation 02/18/17   PT Start Time 0805   PT Stop Time 0851   PT Time Calculation (min) 46 min   Activity Tolerance Patient tolerated treatment well   Behavior During Therapy [facility] for tasks assessed/performed      Past Medical History:  Diagnosis Date  . Allergy   . Asthma   . Asthma in adult, unspecified asthma severity, uncomplicated 06/14/2351  . Bilateral pulmonary embolism ([place]) 11/03/2016  . Morbid obesity with BMI of 45.0-49.9, adult [facility])     Past Surgical History:  Procedure Laterality Date  . RECTAL EXAMINATION UNDER ANESTHESIA W/ CYSTOSCOPY    . TONSILLECTOMY    . TRANSTHORACIC ECHOCARDIOGRAM  11/04/2016    (In setting of bilateral PE) mild LVH. Systolic function was normal.   EF 55-60%. Mild LA dilation. (poor windows)    There were no vitals filed for this visit.      Subjective Assessment - 02/14/17 0809    Subjective I had to go to the ED with early symptoms of bronchitis.  Pt was given medication.     Pertinent History pulmonary embolism 11/02/16 with reduced lung capacity/endurance.    Currently in Pain? No/denies                         [facility] Adult PT Treatment/Exercise - 02/14/17 0001      Lumbar Exercises: Aerobic   Elliptical L2 x 7 min   [place] 1.8-2.0 mph x 10 min  slower speed today due to illness yesterday     Knee/Hip Exercises: Standing   Heel Raises Both;2 sets;20 reps  4# overhead press concurrent   Hip Flexion Stengthening;Both;2 sets;20 reps;Knee straight   Hip Flexion Limitations 5#   Hip  Abduction Stengthening;Both;2 sets;20 reps;Knee straight  4# overhead press single arm at a time for balance   Abduction Limitations 5#   Hip Extension Stengthening;Both;2 sets;20 reps;Knee straight   Extension Limitations 5#   Other Standing Knee Exercises side stepping with green band  door to door                  PT Short Term Goals - 12/15/16 0817      PT SHORT TERM GOAL #4   Title walk for 5-10 minutes without fatigue or need to rest   Time 4   Period Weeks   Status Achieved           PT Long Term Goals - 02/14/17 6144      PT LONG TERM GOAL #2   Title reduce FOTO to < or = to 38% limitation   Baseline 40% limitation   Time 6   Status On-going               Plan - 02/14/17 0820    Clinical Impression Statement Pt was able to advance to 4# with arm weights today.  Pt was diagnosed with bronchitis yesterday so increased fatigue today.  Pt will discharge to HEP this weed for continued endurance gains.     Rehab Potential Good  PT Frequency 2x / week   PT Duration 6 weeks   PT Treatment/Interventions ADLs/Self Care Home Management;Cryotherapy;Electrical Stimulation;Functional mobility training;Stair training;Ultrasound;Therapeutic activities;Moist Heat;Therapeutic exercise;Neuromuscular re-education;Patient/family education;Passive range of motion;Dry needling;Taping   PT Next Visit Plan 1 more session to finalize HEP for continued strength and endurance gains   Consulted and Agree with Plan of Care Patient      Patient will benefit from skilled therapeutic intervention in order to improve the following deficits and impairments:  Abnormal gait, Decreased range of motion, Increased muscle spasms, Decreased endurance, Cardiopulmonary status limiting activity, Decreased activity tolerance, Pain, Decreased strength, Impaired flexibility  Visit Diagnosis: Muscle weakness (generalized)  Pain in right hip  Other abnormalities of gait and  mobility     Problem List Patient Active Problem List   Diagnosis Date Noted  . Anxiety disorder 01/24/2017  . Fibromyalgia muscle pain 01/24/2017  . DOE (dyspnea on exertion) 01/13/2017  . Chest pain with low risk for cardiac etiology 01/13/2017  . Family history of premature CAD 01/13/2017  . GERD (gastroesophageal reflux disease) 12/21/2016  . Vitamin D deficiency, unspecified 12/21/2016  . Iron deficiency anemia due to chronic blood loss 12/20/2016  . Asthma in adult, unspecified asthma severity, uncomplicated 48/05/6551  . Obesity, Class III, BMI 40-49.9 (morbid obesity) ([place]) 11/04/2016  . Pulmonary embolus ([place])   . Bilateral pulmonary embolism ([place]) 11/03/2016    Sigurd Sos, PT 02/14/17 8:42 AM  [facility] [address] [place], [place], [address] Phone: [phone]   Fax:  [phone]  Name: Kathleena Freeman MRN: [id number] Date of Birth: 11/27/1975

## 2017-02-16 ENCOUNTER — Ambulatory Visit: Payer: 59

## 2017-02-16 DIAGNOSIS — M6281 Muscle weakness (generalized): Secondary | ICD-10-CM

## 2017-02-16 DIAGNOSIS — M25551 Pain in right hip: Secondary | ICD-10-CM

## 2017-02-16 DIAGNOSIS — R2689 Other abnormalities of gait and mobility: Secondary | ICD-10-CM

## 2017-02-16 NOTE — Therapy (Signed)
[facility] [address] [place], [place], [address] Phone: [phone]   Fax:  [phone]  Physical Therapy Treatment  Patient Details  Name: Holly Mcdonald MRN: [id number] Date of Birth: March 14, 1976 Referring Provider: Martinique, Betty, MD  Encounter Date: 02/16/2017      PT End of Session - 02/16/17 0837    Visit Number 26   PT Start Time 0805   PT Stop Time 9458   PT Time Calculation (min) 42 min   Activity Tolerance Patient tolerated treatment well   Behavior During Therapy [facility] for tasks assessed/performed      Past Medical History:  Diagnosis Date  . Allergy   . Asthma   . Asthma in adult, unspecified asthma severity, uncomplicated 09/16/2922  . Bilateral pulmonary embolism ([place]) 11/03/2016  . Morbid obesity with BMI of 45.0-49.9, adult [facility])     Past Surgical History:  Procedure Laterality Date  . RECTAL EXAMINATION UNDER ANESTHESIA W/ CYSTOSCOPY    . TONSILLECTOMY    . TRANSTHORACIC ECHOCARDIOGRAM  11/04/2016    (In setting of bilateral PE) mild LVH. Systolic function was normal.   EF 55-60%. Mild LA dilation. (poor windows)    There were no vitals filed for this visit.      Subjective Assessment - 02/16/17 0807    Subjective Ready to D/C to HEP.     Pertinent History pulmonary embolism 11/02/16 with reduced lung capacity/endurance.    Currently in Pain? No/denies            [facility] PT Assessment - 02/16/17 0001      Assessment   Medical Diagnosis physical deconditioning, right hip pain     Precautions   Precautions Other (comment)  recent PE     Prior Function   Level of Independence Independent     Cognition   Overall Cognitive Status Within Functional Limits for tasks assessed     Observation/Other Assessments   Focus on Therapeutic Outcomes (FOTO)  32% limitation     Strength   Right Hip Flexion 4/5  pain   Right Hip Extension 5/5   Right Hip ABduction 4+/5     Ambulation/Gait   Ambulation/Gait Yes   Gait Pattern Within Functional Limits     6 Minute walk- Post Test   Modified Borg Scale for Dyspnea 5- Strong or hard breathing     6 minute walk test results    Aerobic Endurance Distance Walked 1640                     [facility] Adult PT Treatment/Exercise - 02/16/17 0001      Transfers   Comments 6 minute walk testL 20.5 laps     Lumbar Exercises: Aerobic   Elliptical L2 x 7.5 min     Knee/Hip Exercises: Standing   Heel Raises Both;2 sets;20 reps  4# overhead press concurrent   Hip Flexion Stengthening;Both;2 sets;20 reps;Knee straight   Hip Flexion Limitations 5#   Hip Abduction Stengthening;Both;2 sets;20 reps;Knee straight  4# overhead press single arm at a time for balance   Abduction Limitations 5#   Hip Extension Stengthening;Both;2 sets;20 reps;Knee straight   Extension Limitations 5#   Other Standing Knee Exercises side stepping with green band  door to door                  PT Short Term Goals - 12/15/16 0817      PT SHORT TERM GOAL #4  Title walk for 5-10 minutes without fatigue or need to rest   Time 4   Period Weeks   Status Achieved           PT Long Term Goals - 02/16/17 0258      PT LONG TERM GOAL #1   Title be independent in advanced HEP   Status Achieved     PT LONG TERM GOAL #2   Title reduce FOTO to < or = to 38% limitation   Baseline 32% limitation   Status Achieved     PT LONG TERM GOAL #3   Title get in and out of bed without UE assistance with Rt LE   Status Achieved     PT LONG TERM GOAL #4   Title improve cardiovascular endurance to walk for 15-20 minutes without rest   Status Achieved     PT LONG TERM GOAL #5   Title report < or = to 3/10 Rt LE pain with standing and walking   Status Achieved     PT LONG TERM GOAL #6   Title demonstrate >or = to 4+/5 Rt hip strength and full A/ROM to improve functional mobility   Baseline flexion 4/5 with pain, extension  5/5, abduction 4+/5   Status Partially Met     PT LONG TERM GOAL #7   Title walk 1350 feet in 6 minutes with 2 dyspnea Borg scale   Baseline 1640 with 5 dyspnea Borg scale   Status Partially Met               Plan - 02/16/17 0825    Clinical Impression Statement Pt will D/C to HEP today.  Pt able to walk 1640 feet in 6 minutes with Borg scale of 5.  Pt can now walk for 15 minutes in the community and 1 mile for exercise.  Pt with improved Rt hip strength and has pain with resisted testing.  Pt reports intermittent Rt hip pain that varies.  FOTO is improved to 32% limitation.  Pt will D/C to HEP for expected continued endurance gains s/p PE and will follow-up with MD as needed.     PT Next Visit Plan D/C PT to HEP today   Consulted and Agree with Plan of Care Patient      Patient will benefit from skilled therapeutic intervention in order to improve the following deficits and impairments:     Visit Diagnosis: Muscle weakness (generalized)  Pain in right hip  Other abnormalities of gait and mobility     Problem List Patient Active Problem List   Diagnosis Date Noted  . Anxiety disorder 01/24/2017  . Fibromyalgia muscle pain 01/24/2017  . DOE (dyspnea on exertion) 01/13/2017  . Chest pain with low risk for cardiac etiology 01/13/2017  . Family history of premature CAD 01/13/2017  . GERD (gastroesophageal reflux disease) 12/21/2016  . Vitamin D deficiency, unspecified 12/21/2016  . Iron deficiency anemia due to chronic blood loss 12/20/2016  . Asthma in adult, unspecified asthma severity, uncomplicated 52/77/8242  . Obesity, Class III, BMI 40-49.9 (morbid obesity) ([place]) 11/04/2016  . Pulmonary embolus ([place])   . Bilateral pulmonary embolism ([name]) 11/03/2016   PHYSICAL THERAPY DISCHARGE SUMMARY  Visits from Start of Care: 26  Current functional level related to goals / functional outcomes: Pt will D/C to HEP.  Pt has comprehensive HEP in place for continued  endurance and strength gains.     Remaining deficits: Fatigue with long distance ambulation.  Pt has HEP in  place for remaining deficits.     Education / Equipment: HEP Plan: Patient agrees to discharge.  Patient goals were partially met. Patient is being discharged due to being pleased with the current functional level.  ?????         Sigurd Sos, PT 02/16/17 8:46 AM  [place] [address], [place], [place], [address] Phone: [phone]   Fax:  [phone]  Name: Davonne Baby MRN: [id number] Date of Birth: May 31, 1975

## 2017-02-17 ENCOUNTER — Ambulatory Visit: Payer: 59 | Admitting: Cardiology

## 2017-02-24 DIAGNOSIS — R7303 Prediabetes: Secondary | ICD-10-CM | POA: Insufficient documentation

## 2017-02-24 DIAGNOSIS — E785 Hyperlipidemia, unspecified: Secondary | ICD-10-CM | POA: Insufficient documentation

## 2017-02-24 NOTE — Progress Notes (Addendum)
HPI:   [name] is a 41 y.o. female, who is here today for her routine physical.  Last CPE: 02/15/16.  Regular exercise 3 or more time per week: Yes, she is doing PT and walking. Following a healthy diet: Working on a healthier diet. She lives alone. Family lives close by.   Chronic medical problems: HLD,Vit D def,asthma,PE,obesity, allergies, anemia, fibromyalgia,and pre diabetes (02/2016 HgA1C 6.0)  She follows with hematologist (Dr Holly Mcdonald), cardiologist (Dr Holly Mcdonald pulmonologist (Dr Holly Mcdonald).  Pap smear 02/15/16 negative pap and negative HPV. She would like to have pap smear done today. Hx of abnormal pap smears: Over 15 years, she had colpo Bx and Cryotherapy. Hx of STD's Denies  Immunization History  Administered Date(s) Administered  . Influenza,inj,Quad PF,6+ Mos 01/25/2017  . Pneumococcal Polysaccharide-23 11/04/2016   M: 12 G:0 LMP 02/03/17, reporting heavier periods since Eliquis was started. Sexually active with same sex partner for many years. Sometimes she uses condoms. She was on OCP's until PE was diagnosed.  Mammogram: Scheduled for tomorrow.  Hep C screening: Done in 02/2016 negative.  She has no new concerns today.  -She is requesting to be checked for "everything", including hepatitis C and hepatitis B. She is reporting up to date hep B vaccination.  -She is also requesting referral for pulmonary rehabilitation, she has history of asthma and has had frequent exacerbations for the past few months. She is currently following with Dr. Elsworth Mcdonald, next appointment in the middle of November.  Currently she is on Advair 250-50 mcg bid and Albuterol inh prn.  Vitamin D deficiency, currently she is on ergocalciferol 50,000 units q 2 weeks, she would like vitamin D to be rechecked today. Last 25 OH vit D 12/2016 was 17.5.   Review of Systems  Constitutional: Positive for fatigue. Negative for appetite change, fever and unexpected weight  change.  HENT: Negative for dental problem, hearing loss, nosebleeds, sore throat, trouble swallowing and voice change.   Eyes: Negative for photophobia and visual disturbance.  Respiratory: Negative for cough, shortness of breath and wheezing.   Cardiovascular: Negative for chest pain and leg swelling.  Gastrointestinal: Negative for abdominal pain, blood in stool, nausea and vomiting.       No changes in bowel habits.  Endocrine: Negative for cold intolerance, heat intolerance, polydipsia, polyphagia and polyuria.  Genitourinary: Negative for decreased urine volume, dysuria, genital sores, hematuria, menstrual problem, vaginal bleeding and vaginal discharge.       No breast tenderness or nipple discharge.  Musculoskeletal: Positive for arthralgias, back pain and myalgias. Negative for gait problem.  Skin: Negative for rash.  Allergic/Immunologic: Positive for environmental allergies.  Neurological: Negative for syncope, weakness and headaches.  Hematological: Negative for adenopathy. Does not bruise/bleed easily.  Psychiatric/Behavioral: Negative for confusion and sleep disturbance. The patient is nervous/anxious.   All other systems reviewed and are negative.    Current Outpatient Prescriptions on File Prior to Visit  Medication Sig Dispense Refill  . acetaminophen (TYLENOL) 650 MG CR tablet Take 650 mg by mouth every 8 (eight) hours as needed for pain.    Marland Kitchen albuterol (PROVENTIL HFA;VENTOLIN HFA) 108 (90 Base) MCG/ACT inhaler Inhale 1-2 puffs into the lungs every 6 (six) hours as needed for wheezing or shortness of breath. 18 g 2  . albuterol (PROVENTIL) (2.5 MG/3ML) 0.083% nebulizer solution Take 3 mLs (2.5 mg total) by nebulization every 6 (six) hours as needed for wheezing or shortness of breath. 360 mL 5  .  calcium carbonate (TUMS - DOSED IN MG ELEMENTAL CALCIUM) 500 MG chewable tablet Chew 1 tablet by mouth daily as needed.     . cetirizine (ZYRTEC) 10 MG tablet Take 10 mg by mouth  daily.    . DULoxetine (CYMBALTA) 60 MG capsule Take 1 capsule (60 mg total) by mouth daily. 90 capsule 1  . ELIQUIS 5 MG TABS tablet TAKE 1 TABLET(5 MG) BY MOUTH TWICE DAILY 60 tablet 2  . fluticasone (FLONASE) 50 MCG/ACT nasal spray Place 1 spray into both nostrils daily. 16 g 3  . Fluticasone-Salmeterol (ADVAIR DISKUS) 250-50 MCG/DOSE AEPB Inhale 1 puff into the lungs 2 (two) times daily. 60 each 2  . methocarbamol (ROBAXIN) 500 MG tablet Take 1 tablet (500 mg total) by mouth every 8 (eight) hours as needed for muscle spasms. 45 tablet 1  . omeprazole (PRILOSEC) 40 MG capsule Take 1 capsule (40 mg total) by mouth daily. 30 capsule 2  . oxybutynin (DITROPAN) 5 MG tablet Take 1 tablet by mouth 2 (two) times daily.  11  . timolol (TIMOPTIC) 0.5 % ophthalmic solution Place 1 drop into both eyes every morning.    . Vitamin D, Ergocalciferol, (DRISDOL) 50000 units CAPS capsule 1 cap weekly for 8 weeks then every 2 weeks. 12 capsule 2   No current facility-administered medications on file prior to visit.      Past Medical History:  Diagnosis Date  . Allergy   . Asthma   . Asthma in adult, unspecified asthma severity, uncomplicated 10/09/8313  . Bilateral pulmonary embolism ([place]) 11/03/2016  . Morbid obesity with BMI of 45.0-49.9, adult [facility])     Past Surgical History:  Procedure Laterality Date  . RECTAL EXAMINATION UNDER ANESTHESIA W/ CYSTOSCOPY    . TONSILLECTOMY    . TRANSTHORACIC ECHOCARDIOGRAM  11/04/2016    (In setting of bilateral PE) mild LVH. Systolic function was normal.   EF 55-60%. Mild LA dilation. (poor windows)    Allergies  Allergen Reactions  . Other Nausea And Vomiting    Darvocet N-100    Family History  Problem Relation Age of Onset  . Pulmonary embolism Mother        Last year  . Mental retardation Mother        PTSD and fibromyalgia  . Heart disease Mother        CHF  . Congestive Heart Failure Mother   . Heart disease Father 57       CAD  .  Hyperlipidemia Father   . Diabetes Paternal Grandmother   . Heart attack Paternal Grandmother   . Congestive Heart Failure Maternal Aunt   . Cancer Neg Hx     Social History   Social History  . Marital status: Divorced    Spouse name: N/A  . Number of children: N/A  . Years of education: N/A   Social History Main Topics  . Smoking status: Never Smoker  . Smokeless tobacco: Never Used  . Alcohol use Yes  . Drug use: No  . Sexual activity: Not Asked   Other Topics Concern  . None   Social History Narrative  . None     Vitals:   02/25/17 0752  BP: 118/78  Pulse: 72  Resp: 12  Temp: 98.7 F (37.1 C)  SpO2: 96%   Body mass index is 48.52 kg/m.   Wt Readings from Last 3 Encounters:  02/25/17 (!) 338 lb 2 oz (153.4 kg)  01/25/17 (!) 342 lb 6 oz (155.3 kg)  01/13/17 (!) 345 lb (156.5 kg)    Physical Exam  Nursing note and vitals reviewed. Constitutional: She is oriented to person, place, and time. She appears well-developed. No distress.  HENT:  Head: Normocephalic and atraumatic.  Right Ear: Hearing, tympanic membrane, external ear and ear canal normal.  Left Ear: Hearing, tympanic membrane, external ear and ear canal normal.  Mouth/Throat: Uvula is midline, oropharynx is clear and moist and mucous membranes are normal.  Eyes: Pupils are equal, round, and reactive to light. Conjunctivae and EOM are normal.  Neck: No JVD present. No tracheal deviation present. No thyromegaly present.  Cardiovascular: Normal rate and regular rhythm.   No murmur heard. Pulses:      Dorsalis pedis pulses are 2+ on the right side, and 2+ on the left side.       Posterior tibial pulses are 2+ on the right side, and 2+ on the left side.  Respiratory: Effort normal and breath sounds normal. No respiratory distress.  GI: Soft. She exhibits no mass. There is no hepatomegaly. There is no tenderness.  Genitourinary: No breast swelling or tenderness. There is no rash, tenderness or  lesion on the right labia. There is no rash, tenderness or lesion on the left labia. Uterus is not enlarged and not tender. Cervix exhibits no motion tenderness, no discharge and no friability. Right adnexum displays no mass, no tenderness and no fullness. Left adnexum displays no mass, no tenderness and no fullness. No erythema or tenderness in the vagina. No vaginal discharge found.    Genitourinary Comments: Breast: no masses,skin changes,or nipple discharge. Upon palpation cervix is nodular lesions x 2 between 11 and 1 O'clock, not appreciated on inspection. Nabothian cyst at 11 O'clock  Pelvic manual exam difficult due to body habits.  Musculoskeletal: She exhibits edema (Trace pitting LE edema, bilateral.) and tenderness.  No major deformity or signs of synovitis appreciated. Points of tenderness upon palpation of different muscles during exam.  Lymphadenopathy:    She has no cervical adenopathy.    She has no axillary adenopathy.       Right: No inguinal and no supraclavicular adenopathy present.       Left: No inguinal and no supraclavicular adenopathy present.  Neurological: She is alert and oriented to person, place, and time. She has normal strength. No cranial nerve deficit or sensory deficit. Coordination and gait normal.  Reflex Scores:      Bicep reflexes are 2+ on the right side and 2+ on the left side.      Patellar reflexes are 2+ on the right side and 2+ on the left side. Skin: Skin is warm. No rash noted. No erythema.  Psychiatric: Her mood appears anxious. Cognition and memory are normal.  Well groomed, good eye contact.     ASSESSMENT AND PLAN:   Ms. Haide was seen today for annual exam.  Diagnoses and all orders for this visit:  Lab Results  Component Value Date   CHOL 179 02/25/2017   HDL 33.30 (L) 02/25/2017   LDLCALC 119 (H) 02/25/2017   TRIG 135.0 02/25/2017   CHOLHDL 5 02/25/2017   Lab Results  Component Value Date   CREATININE 0.67  02/13/2017   BUN 8 02/13/2017   NA 137 02/13/2017   K 3.9 02/13/2017   CL 106 02/13/2017   CO2 24 02/13/2017    Routine general medical examination at a health care facility  We discussed the importance of regular physical activity and healthy diet for prevention of  chronic illness and/or complications. Preventive guidelines reviewed. Vaccination up to date.  Next CPE in a year.  The 10-year ASCVD risk score Mikey Bussing DC Brooke Bonito., et al., 2013) is: 1.1%   Values used to calculate the score:     Age: 47 years     Sex: Female     Is Non-Hispanic African American: Yes     Diabetic: No     Tobacco smoker: No     Systolic Blood Pressure: 595 mmHg     Is BP treated: No     HDL Cholesterol: 33.3 mg/dL     Total Cholesterol: 179 mg/dL  Hyperlipidemia, unspecified hyperlipidemia type  Continue nonpharmacologic treatment. Follow-up in 6-12 months, depending on lipid panel results.  -     Lipid panel  Pre-diabetes  Continue working on a healthier lifestyle for primary prevention. Further recommendations will be given according to HgA1C results.  -     Hemoglobin A1c  Cervical cancer screening -     Cytology - PAP ([place])  Screen for STD (sexually transmitted disease)  Educated about STD prevention and current recommendations in regard to screening.  -     Hepatitis C antibody screen -     HIV antibody -     Hepatitis B surface antigen -     RPR  Patient requested diagnostic testing -     Hepatitis C antibody screen -     Hepatitis B surface antigen  Menorrhagia with regular cycle  We discussed some side effects of Celebrex, which was recommended to take lifetime. For the recommendations would be given according to CBC. Results. Continue iron supplementation.  -     CBC  Encounter for management of intrauterine contraceptive device (IUD), unspecified IUD management type  We discussed a few birth control options, given her history of PE I think she would benefit  from progesterone base oral or injectable, IUD, or Nexplanon.  She is interested in discussing IUD Mirena, referral to gynecologist was placed.  -     Ambulatory referral to Gynecology  Vitamin D deficiency, unspecified  No changes in current management, will follow labs done today and will give further recommendations accordingly.  -     VITAMIN D 25 Hydroxy (Vit-D Deficiency, Fractures)  Fibromyalgia muscle pain  Continue Cymbalta 60 mg daily, which has helped. She has an appointment December/2018.           Return in 1 year (on 02/25/2018).          Betty G. Martinique, MD  [facility]. [place] office.

## 2017-02-25 ENCOUNTER — Ambulatory Visit (INDEPENDENT_AMBULATORY_CARE_PROVIDER_SITE_OTHER): Payer: 59 | Admitting: Family Medicine

## 2017-02-25 ENCOUNTER — Encounter: Payer: Self-pay | Admitting: Family Medicine

## 2017-02-25 ENCOUNTER — Other Ambulatory Visit (HOSPITAL_COMMUNITY)
Admission: RE | Admit: 2017-02-25 | Discharge: 2017-02-25 | Disposition: A | Discharge status: Home / Self Care | Payer: 59 | Source: Ambulatory Visit | Attending: Family Medicine | Admitting: Family Medicine

## 2017-02-25 VITALS — BP 118/78 | HR 72 | Temp 98.7°F | Resp 12 | Ht 70.0 in | Wt 338.1 lb

## 2017-02-25 DIAGNOSIS — Z30431 Encounter for routine checking of intrauterine contraceptive device: Secondary | ICD-10-CM | POA: Diagnosis not present

## 2017-02-25 DIAGNOSIS — E559 Vitamin D deficiency, unspecified: Secondary | ICD-10-CM | POA: Insufficient documentation

## 2017-02-25 DIAGNOSIS — E785 Hyperlipidemia, unspecified: Secondary | ICD-10-CM | POA: Insufficient documentation

## 2017-02-25 DIAGNOSIS — R7303 Prediabetes: Secondary | ICD-10-CM

## 2017-02-25 DIAGNOSIS — Z113 Encounter for screening for infections with a predominantly sexual mode of transmission: Secondary | ICD-10-CM

## 2017-02-25 DIAGNOSIS — N920 Excessive and frequent menstruation with regular cycle: Secondary | ICD-10-CM

## 2017-02-25 DIAGNOSIS — Z0000 Encounter for general adult medical examination without abnormal findings: Secondary | ICD-10-CM

## 2017-02-25 DIAGNOSIS — Z124 Encounter for screening for malignant neoplasm of cervix: Secondary | ICD-10-CM | POA: Diagnosis present

## 2017-02-25 DIAGNOSIS — Z0189 Encounter for other specified special examinations: Secondary | ICD-10-CM | POA: Insufficient documentation

## 2017-02-25 DIAGNOSIS — M797 Fibromyalgia: Secondary | ICD-10-CM | POA: Diagnosis not present

## 2017-02-25 LAB — CBC
HEMATOCRIT: 39.5 % (ref 36.0–46.0)
HEMOGLOBIN: 12.9 g/dL (ref 12.0–15.0)
MCHC: 32.7 g/dL (ref 30.0–36.0)
MCV: 86.3 fl (ref 78.0–100.0)
Platelets: 239 10*3/uL (ref 150.0–400.0)
RBC: 4.58 Mil/uL (ref 3.87–5.11)
RDW: 15.8 % — ABNORMAL HIGH (ref 11.5–15.5)
WBC: 5.2 10*3/uL (ref 4.0–10.5)

## 2017-02-25 LAB — VITAMIN D 25 HYDROXY (VIT D DEFICIENCY, FRACTURES): VITD: 18.47 ng/mL — ABNORMAL LOW (ref 30.00–100.00)

## 2017-02-25 LAB — LIPID PANEL
CHOL/HDL RATIO: 5
Cholesterol: 179 mg/dL (ref 0–200)
HDL: 33.3 mg/dL — AB (ref >39.00)
LDL Cholesterol: 119 mg/dL — ABNORMAL HIGH (ref 0–99)
NONHDL: 145.88
Triglycerides: 135 mg/dL (ref 0.0–149.0)
VLDL: 27 mg/dL (ref 0.0–40.0)

## 2017-02-25 LAB — HEMOGLOBIN A1C: Hgb A1c MFr Bld: 5.9 % (ref 4.6–6.5)

## 2017-02-25 NOTE — Patient Instructions (Addendum)
A few things to remember from today's visit:   Cervical cancer screening - Plan: Cytology - PAP ([place])  Hyperlipidemia, unspecified hyperlipidemia type - Plan: Lipid panel  Pre-diabetes - Plan: Hemoglobin A1c  Screen for STD (sexually transmitted disease) - Plan: Hepatitis C antibody screen, HIV antibody  Patient requested diagnostic testing  Menorrhagia with regular cycle - Plan: CBC  Routine general medical examination at a health care facility  Today you have you routine preventive visit.  At least 150 minutes of moderate exercise per week, daily brisk walking for 15-30 min is a good exercise option. Healthy diet low in saturated (animal) fats and sweets and consisting of fresh fruits and vegetables, lean meats such as fish and white chicken and whole grains.  These are some of recommendations for screening depending of age and risk factors:   - Vaccines:  Tdap vaccine every 10 years.  Shingles vaccine recommended at age 56, could be given after 39 years of age but not sure about insurance coverage.   Pneumonia vaccines:  Prevnar 13 at 65 and Pneumovax at 31. Sometimes Pneumovax is giving earlier if history of smoking, lung disease,diabetes,kidney disease among some.    Screening for diabetes at age 36 and every 3 years.  Cervical cancer prevention:  Pap smear starts at 40 years of age and continues periodically until 40 years old in low risk women. Pap smear every 3 years between 61 and 38 years old. Pap smear every 3-5 years among women 84 and older if pap smear negative and HPV screening negative.   -Breast cancer: Mammogram: There is disagreement between experts about when to start screening in low risk asymptomatic female but recent recommendations are to start screening at 59 and not later than 40 years old , every 1-2 years and after 40 yo q 2 years. Screening is recommended until 40 years old but some women can continue screening depending of healthy  issues.   Colon cancer screening: starts at 40 years old until 40 years old.   Also recommended:  1. Dental visit- Brush and floss your teeth twice daily; visit your dentist twice a year. 2. Eye doctor- Get an eye exam at least every 2 years. 3. Helmet use- Always wear a helmet when riding a bicycle, motorcycle, rollerblading or skateboarding. 4. Safe sex- If you may be exposed to sexually transmitted infections, use a condom. 5. Seat belts- Seat belts can save your live; always wear one. 6. Smoke/Carbon Monoxide detectors- These detectors need to be installed on the appropriate level of your home. Replace batteries at least once a year. 7. Skin cancer- When out in the sun please cover up and use sunscreen 15 SPF or higher. 8. Violence- If anyone is threatening or hurting you, please tell your healthcare provider.  9. Drink alcohol in moderation- Limit alcohol intake to one drink or less per day. Never drink and drive.   Continue following with pulmonologist,cardiologist,and hematologist and request refills for conditions they are managing during your next follow up appts with them.   Please be sure medication list is accurate. If a new problem present, please set up appointment sooner than planned today.

## 2017-02-26 ENCOUNTER — Other Ambulatory Visit: Payer: Self-pay | Admitting: Family Medicine

## 2017-02-26 DIAGNOSIS — J45909 Unspecified asthma, uncomplicated: Secondary | ICD-10-CM

## 2017-02-26 LAB — HM MAMMOGRAPHY

## 2017-02-27 ENCOUNTER — Encounter: Payer: Self-pay | Admitting: Family Medicine

## 2017-02-28 ENCOUNTER — Encounter: Payer: Self-pay | Admitting: Family Medicine

## 2017-02-28 LAB — CYTOLOGY - PAP
Adequacy: ABSENT
Chlamydia: NEGATIVE
Diagnosis: NEGATIVE
NEISSERIA GONORRHEA: NEGATIVE
TRICH (WINDOWPATH): NEGATIVE

## 2017-02-28 LAB — HEPATITIS C ANTIBODY
HEP C AB: NONREACTIVE
SIGNAL TO CUT-OFF: 0.01 (ref <1.00)

## 2017-02-28 LAB — HEPATITIS B SURFACE ANTIGEN: HEP B S AG: NONREACTIVE

## 2017-02-28 LAB — RPR: RPR: NONREACTIVE

## 2017-02-28 LAB — HIV ANTIBODY (ROUTINE TESTING W REFLEX): HIV: NONREACTIVE

## 2017-03-01 ENCOUNTER — Encounter: Payer: Self-pay | Admitting: Family Medicine

## 2017-03-03 ENCOUNTER — Encounter: Payer: Self-pay | Admitting: Gynecology

## 2017-03-03 ENCOUNTER — Ambulatory Visit (INDEPENDENT_AMBULATORY_CARE_PROVIDER_SITE_OTHER): Payer: 59 | Admitting: Gynecology

## 2017-03-03 VITALS — BP 134/82 | Ht 70.0 in | Wt 336.0 lb

## 2017-03-03 DIAGNOSIS — N889 Noninflammatory disorder of cervix uteri, unspecified: Secondary | ICD-10-CM | POA: Diagnosis not present

## 2017-03-03 DIAGNOSIS — Z3009 Encounter for other general counseling and advice on contraception: Secondary | ICD-10-CM

## 2017-03-03 DIAGNOSIS — N920 Excessive and frequent menstruation with regular cycle: Secondary | ICD-10-CM

## 2017-03-03 NOTE — Patient Instructions (Signed)
Follow-up for Mirena IUD placement as arranged

## 2017-03-03 NOTE — Progress Notes (Signed)
    Holly Mcdonald [birth year] [id number]        41 y.o.  G0P0000 new patient who presents to discuss contraceptive options and history of heavy menses.  Recent hemoglobin 12.9.  History of pulmonary embolus currently on Eliquis.  Notes that her menses have gotten heavy since starting the Eliquis.  No bleeding in between.  Currently using withdrawal for contraception.  History of abnormal Pap smear early 20s with cryosurgery and normal Pap smears since then.  Pap smear/HPV 2017.  Follow-up Pap smear 2019 both negative.  Recent exam to include breast exam normal with the exception of nodularity on her cervix noted.  Past medical history,surgical history, problem list, medications, allergies, family history and social history were all reviewed and documented in the EPIC chart.  Directed ROS with pertinent positives and negatives documented in the history of present illness/assessment and plan.  Exam: [name] assistant Vitals:   03/03/17 0851  BP: 134/82  Weight: (!) 336 lb (152.4 kg)  Height: 5\' 10"  (1.778 m)   General appearance:  Normal Abdomen obese without masses guarding rebound Pelvic external BUS vagina normal.  Cervix with small nabothian cyst classic in appearance at 11:00.  Prominence of the anterior cervical lip with some pointing.  Uterus grossly normal on bimanual but limited by abdominal girth.  Adnexa without gross masses or tenderness  Assessment/Plan:  41 y.o. G0P0000 with:  1. Menorrhagia after starting Eliquis.  Reviewed differential to include normal anatomy with heavier menses secondary to medication versus abnormality such as leiomyoma/polyps.  Given the timing I suspect this is secondary to the Eliquis.  Options for management reviewed to include hormonal manipulation and IUDs.  Given her history of pulmonary embolus and anticoagulation feel Mirena IUD would be an excellent choice to hopefully decrease menstrual flow.  Would also address contraception.  Issues  of progesterone absorption and risk of increased blood clots discussed although unlikely.  Patient wants to go ahead and schedule she will arrange this with her menses.  I reviewed the insertional process, risks versus benefits.  Perforation/migration requiring surgery to remove, infection with subsequent possibility of fertility issues, progesterone absorption with symptoms such as acne, breast tenderness and weight gain, failure with the possibility of pregnancy L discussed. 2. Contraception.  I reviewed contraceptive options to include pill, patch, ring, Nexplanon, Depo-Provera, IUDs.  The pros/cons, risks/benefits of each choice discussed.  Given her total picture I think Mirena IUD is a good choice as above and she is going to follow-up for this. 3. Cervical irregularity.  She has a small classic nabothian cyst at 11:00.  Also has some pointing/prominence of the anterior lip of the cervix which I think is either physiologic or possibly secondary to her cryo but no significant abnormalities on exam.  Reviewed all this with the patient with no special follow-up required.  Greater than 50% of my time was spent in direct face to face counseling and coordination of care with the patient.     Anastasio Auerbach MD, 9:28 AM 03/03/2017

## 2017-03-05 ENCOUNTER — Encounter: Payer: Self-pay | Admitting: Family Medicine

## 2017-03-05 ENCOUNTER — Encounter (HOSPITAL_COMMUNITY): Payer: Self-pay | Admitting: Emergency Medicine

## 2017-03-05 ENCOUNTER — Ambulatory Visit (HOSPITAL_COMMUNITY)
Admission: EM | Admit: 2017-03-05 | Discharge: 2017-03-05 | Disposition: A | Discharge status: Home / Self Care | Payer: 59 | Attending: Family Medicine | Admitting: Family Medicine

## 2017-03-05 DIAGNOSIS — J0101 Acute recurrent maxillary sinusitis: Secondary | ICD-10-CM

## 2017-03-05 MED ORDER — BUTALBITAL-ASPIRIN-CAFFEINE 50-325-40 MG PO CAPS: 1.0000 | ORAL_CAPSULE | Freq: Two times a day (BID) | ORAL | 0 refills | Status: DC | PRN

## 2017-03-05 MED ORDER — AMOXICILLIN-POT CLAVULANATE 875-125 MG PO TABS: 1.0000 | ORAL_TABLET | Freq: Two times a day (BID) | ORAL | 0 refills | Status: DC

## 2017-03-05 NOTE — ED Provider Notes (Signed)
[place]   [id number] 03/05/17 Arrival Time: 106   SUBJECTIVE:  Holly Mcdonald is a 41 y.o. female who presents to the urgent care with complaint of sinus inf onset yest associated w/prod cough, PND, facial pressure, HA. The symptoms actually been with her for the better part of 3 weeks but she just got significant worse yesterday.  Denies fevers  She has a history of pulmonary emboli and asthma but she has not had any chest pain or cough or wheezing with this facial pain and fullness.     Past Medical History:  Diagnosis Date  . Allergy   . Asthma   . Asthma in adult, unspecified asthma severity, uncomplicated 11/15/4694  . Bilateral pulmonary embolism ([place]) 11/03/2016  . Morbid obesity with BMI of 45.0-49.9, adult ([facility])    Family History  Problem Relation Age of Onset  . Pulmonary embolism Mother        Last year  . Mental retardation Mother        PTSD and fibromyalgia  . Heart disease Mother        CHF  . Congestive Heart Failure Mother   . Heart disease Father 67       CAD  . Hyperlipidemia Father   . Diabetes Paternal Grandmother   . Heart attack Paternal Grandmother   . Congestive Heart Failure Maternal Aunt   . Cancer Neg Hx    Social History   Social History  . Marital status: Divorced    Spouse name: N/A  . Number of children: N/A  . Years of education: N/A   Occupational History  . Not on file.   Social History Main Topics  . Smoking status: Never Smoker  . Smokeless tobacco: Never Used  . Alcohol use Yes     Comment: Occas  . Drug use: No  . Sexual activity: Yes    Birth control/ protection: Coitus interruptus     Comment: 1st intercourse 41 yo-More than 5 partners   Other Topics Concern  . Not on file   Social History Narrative  . No narrative on file   Current Meds  Medication Sig  . acetaminophen (TYLENOL) 650 MG CR tablet Take 650 mg by mouth every 8 (eight) hours as needed for pain.  Marland Kitchen ADVAIR DISKUS  250-50 MCG/DOSE AEPB INHALE 1 PUFF INTO THE LUNGS TWICE DAILY  . albuterol (PROVENTIL HFA;VENTOLIN HFA) 108 (90 Base) MCG/ACT inhaler Inhale 1-2 puffs into the lungs every 6 (six) hours as needed for wheezing or shortness of breath.  Marland Kitchen albuterol (PROVENTIL) (2.5 MG/3ML) 0.083% nebulizer solution Take 3 mLs (2.5 mg total) by nebulization every 6 (six) hours as needed for wheezing or shortness of breath.  . calcium carbonate (TUMS - DOSED IN MG ELEMENTAL CALCIUM) 500 MG chewable tablet Chew 1 tablet by mouth daily as needed.   . cetirizine (ZYRTEC) 10 MG tablet Take 10 mg by mouth daily.  . DULoxetine (CYMBALTA) 60 MG capsule Take 1 capsule (60 mg total) by mouth daily.  Marland Kitchen ELIQUIS 5 MG TABS tablet TAKE 1 TABLET(5 MG) BY MOUTH TWICE DAILY  . fluticasone (FLONASE) 50 MCG/ACT nasal spray Place 1 spray into both nostrils daily.  . IRON PO Take by mouth.  . methocarbamol (ROBAXIN) 500 MG tablet Take 1 tablet (500 mg total) by mouth every 8 (eight) hours as needed for muscle spasms.  Marland Kitchen omeprazole (PRILOSEC) 40 MG capsule Take 1 capsule (40 mg total) by mouth daily.  Marland Kitchen oxybutynin (DITROPAN) 5  MG tablet Take 1 tablet by mouth 2 (two) times daily.  . timolol (TIMOPTIC) 0.5 % ophthalmic solution Place 1 drop into both eyes every morning.  . Vitamin D, Ergocalciferol, (DRISDOL) 50000 units CAPS capsule 1 cap weekly for 8 weeks then every 2 weeks.   Allergies  Allergen Reactions  . Other Nausea And Vomiting    Darvocet N-100      ROS: As per HPI, remainder of ROS negative.   OBJECTIVE:   Vitals:   03/05/17 1323  BP: 133/79  Pulse: 92  Resp: 20  Temp: 98.1 F (36.7 C)  TempSrc: Oral  SpO2: 96%     General appearance: alert; no distress Eyes: PERRL; EOMI; conjunctiva normal HENT: normocephalic; atraumatic; TMs normal, canal normal, external ears normal without trauma; nasal mucosa shows bloody exudates; oral mucosa normal Neck: supple Lungs: clear to auscultation bilaterally Heart:  regular rate and rhythm Back: no CVA tenderness Extremities: no cyanosis or edema; symmetrical with no gross deformities Skin: warm and dry Neurologic: normal gait; grossly normal Psychological: alert and cooperative; normal mood and affect      Labs:  Results for orders placed or performed in visit on 03/01/17  [facility] MAMMOGRAPHY  Result Value Ref Range   HM Mammogram 0-4 Bi-Rad 0-4 Bi-Rad, Self Reported Normal    Labs Reviewed - No data to display  No results found.     ASSESSMENT & PLAN:  1. Acute recurrent maxillary sinusitis     Meds ordered this encounter  Medications  . amoxicillin-clavulanate (AUGMENTIN) 875-125 MG tablet    Sig: Take 1 tablet by mouth every 12 (twelve) hours.    Dispense:  14 tablet    Refill:  0  . butalbital-aspirin-caffeine (FIORINAL) 50-325-40 MG capsule    Sig: Take 1 capsule by mouth 2 (two) times daily as needed for headache.    Dispense:  14 capsule    Refill:  0    Reviewed expectations re: course of current medical issues. Questions answered. Outlined signs and symptoms indicating need for more acute intervention. Patient verbalized understanding. After Visit Summary given.     Robyn Haber, MD 03/05/17 1348

## 2017-03-05 NOTE — ED Triage Notes (Signed)
Pt here for sinus inf onset yest associated w/prod cough, PND, facial pressure, HA  Denies fevers  A&O x4... NAD... Ambulatory

## 2017-03-07 ENCOUNTER — Other Ambulatory Visit: Payer: Self-pay | Admitting: Family Medicine

## 2017-03-07 DIAGNOSIS — J309 Allergic rhinitis, unspecified: Secondary | ICD-10-CM

## 2017-03-13 ENCOUNTER — Other Ambulatory Visit: Payer: Self-pay | Admitting: Family Medicine

## 2017-03-13 DIAGNOSIS — K219 Gastro-esophageal reflux disease without esophagitis: Secondary | ICD-10-CM

## 2017-03-15 ENCOUNTER — Emergency Department (HOSPITAL_COMMUNITY)
Admission: EM | Admit: 2017-03-15 | Discharge: 2017-03-15 | Disposition: A | Discharge status: Home / Self Care | Payer: 59 | Attending: Emergency Medicine | Admitting: Emergency Medicine

## 2017-03-15 ENCOUNTER — Encounter (HOSPITAL_COMMUNITY): Payer: Self-pay | Admitting: Emergency Medicine

## 2017-03-15 ENCOUNTER — Emergency Department (HOSPITAL_COMMUNITY): Payer: 59

## 2017-03-15 ENCOUNTER — Other Ambulatory Visit: Payer: Self-pay

## 2017-03-15 DIAGNOSIS — Z7901 Long term (current) use of anticoagulants: Secondary | ICD-10-CM | POA: Diagnosis not present

## 2017-03-15 DIAGNOSIS — Z79899 Other long term (current) drug therapy: Secondary | ICD-10-CM | POA: Insufficient documentation

## 2017-03-15 DIAGNOSIS — J45909 Unspecified asthma, uncomplicated: Secondary | ICD-10-CM | POA: Diagnosis not present

## 2017-03-15 DIAGNOSIS — R0789 Other chest pain: Secondary | ICD-10-CM | POA: Insufficient documentation

## 2017-03-15 DIAGNOSIS — R079 Chest pain, unspecified: Secondary | ICD-10-CM | POA: Diagnosis present

## 2017-03-15 LAB — BASIC METABOLIC PANEL
ANION GAP: 9 (ref 5–15)
BUN: 8 mg/dL (ref 6–20)
CHLORIDE: 103 mmol/L (ref 101–111)
CO2: 26 mmol/L (ref 22–32)
CREATININE: 0.68 mg/dL (ref 0.44–1.00)
Calcium: 9.4 mg/dL (ref 8.9–10.3)
GFR calc non Af Amer: >60 mL/min (ref >60)
Glucose, Bld: 110 mg/dL — ABNORMAL HIGH (ref 65–99)
Potassium: 3.8 mmol/L (ref 3.5–5.1)
SODIUM: 138 mmol/L (ref 135–145)

## 2017-03-15 LAB — CBC
HCT: 39.9 % (ref 36.0–46.0)
Hemoglobin: 13.1 g/dL (ref 12.0–15.0)
MCH: 28.5 pg (ref 26.0–34.0)
MCHC: 32.8 g/dL (ref 30.0–36.0)
MCV: 86.9 fL (ref 78.0–100.0)
PLATELETS: 262 10*3/uL (ref 150–400)
RBC: 4.59 MIL/uL (ref 3.87–5.11)
RDW: 14.6 % (ref 11.5–15.5)
WBC: 7.8 10*3/uL (ref 4.0–10.5)

## 2017-03-15 LAB — I-STAT TROPONIN, ED: TROPONIN I, POC: 0 ng/mL (ref 0.00–0.08)

## 2017-03-15 MED ORDER — PREDNISONE 10 MG PO TABS: 50.0000 mg | ORAL_TABLET | Freq: Every day | ORAL | 0 refills | Status: AC

## 2017-03-15 NOTE — ED Triage Notes (Signed)
Patient reports central chest pain radiating to right lateral ribcage with SOB , productive cough and nausea . Denies fever or chills .

## 2017-03-15 NOTE — ED Notes (Signed)
Pt coming from xray 

## 2017-03-15 NOTE — ED Notes (Signed)
In X ray

## 2017-03-15 NOTE — ED Provider Notes (Signed)
[facility] EMERGENCY DEPARTMENT Provider Note   CSN: [id number] Arrival date & time: 03/15/17  2030     History   Chief Complaint Chief Complaint  Patient presents with  . Chest Pain    HPI Holly Mcdonald is a 41 y.o. female presenting with cough, SOB, and CP.   Pt states that these sxs have been present since she was dx with bilateral PE in 11/2016. She has been tested x3 for bronchitis, and had recent tx for sinus infection, abx finished, pt reports continued cough with yellow phlegm production. Today she reports intermittent chest pain, feels like a stinging sensation. She has discussed this sensation with her cardiologist, who has told her she has costochondritis. She is not on any tx for this currently. She is taking eliquis as prescribed. Pt reports her SOB is constant. Pt states he has an apt with cardiology and pulmonology in the next 2 wks. She denies fevers, chills, vision changes, n/v, abd pain, diaphoresis, urinary sxs or abnormal BMs. She denies pain or swelling of her legs., She denies hormone use, recent travel, recent surgeries, recent immobilization. She has never had a MI or dx of CAD.    HPI  Past Medical History:  Diagnosis Date  . Allergy   . Asthma   . Asthma in adult, unspecified asthma severity, uncomplicated 12/14/5641  . Bilateral pulmonary embolism ([place]) 11/03/2016  . Morbid obesity with BMI of 45.0-49.9, adult [facility])     Patient Active Problem List   Diagnosis Date Noted  . Allergic rhinitis 03/07/2017  . Hyperlipidemia 02/24/2017  . Pre-diabetes 02/24/2017  . Anxiety disorder 01/24/2017  . Fibromyalgia muscle pain 01/24/2017  . DOE (dyspnea on exertion) 01/13/2017  . Chest pain with low risk for cardiac etiology 01/13/2017  . Family history of premature CAD 01/13/2017  . GERD (gastroesophageal reflux disease) 12/21/2016  . Vitamin D deficiency, unspecified 12/21/2016  . Iron deficiency anemia due to chronic blood loss  12/20/2016  . Asthma in adult, unspecified asthma severity, uncomplicated 32/95/1884  . Obesity, Class III, BMI 40-49.9 (morbid obesity) ([place]) 11/04/2016  . Pulmonary embolus ([place])   . Bilateral pulmonary embolism ([place]) 11/03/2016    Past Surgical History:  Procedure Laterality Date  . GYNECOLOGIC CRYOSURGERY     Early 35s  . RECTAL EXAMINATION UNDER ANESTHESIA W/ CYSTOSCOPY    . TONSILLECTOMY    . TRANSTHORACIC ECHOCARDIOGRAM  11/04/2016    (In setting of bilateral PE) mild LVH. Systolic function was normal.   EF 55-60%. Mild LA dilation. (poor windows)    OB History    Gravida Para Term Preterm AB Living   0 0 0 0 0 0   SAB TAB Ectopic Multiple Live Births   0 0 0 0 0       Home Medications    Prior to Admission medications   Medication Sig Start Date End Date Taking? Authorizing Provider  acetaminophen (TYLENOL) 650 MG CR tablet Take 650 mg by mouth every 8 (eight) hours as needed for pain.   Yes [provider]  ADVAIR DISKUS 250-50 MCG/DOSE AEPB INHALE 1 PUFF INTO THE LUNGS TWICE DAILY 02/28/17  Yes [place]ique, Betty G, MD  albuterol (PROVENTIL HFA;VENTOLIN HFA) 108 (90 Base) MCG/ACT inhaler Inhale 1-2 puffs into the lungs every 6 (six) hours as needed for wheezing or shortness of breath. 12/07/16  Yes [place], Betty G, MD  amoxicillin-clavulanate (AUGMENTIN) 875-125 MG tablet Take 1 tablet by mouth every 12 (twelve) hours. 03/05/17  Yes  Robyn Haber, MD  calcium carbonate (TUMS - DOSED IN MG ELEMENTAL CALCIUM) 500 MG chewable tablet Chew 1 tablet by mouth daily as needed.    Yes [provider]  cetirizine (ZYRTEC) 10 MG tablet Take 10 mg by mouth daily.   Yes [provider]  DULoxetine (CYMBALTA) 60 MG capsule Take 1 capsule (60 mg total) by mouth daily. 01/25/17  Yes [place]ique, Betty G, MD  ELIQUIS 5 MG TABS tablet TAKE 1 TABLET(5 MG) BY MOUTH TWICE DAILY 02/07/17  Yes Martin[place], Betty G, MD  fluticasone [facility]) 50 MCG/ACT nasal spray Place 1 spray  into both nostrils daily. 01/07/17  Yes [place], Betty G, MD  IRON PO Take by mouth.   Yes [provider]  methocarbamol (ROBAXIN) 500 MG tablet Take 1 tablet (500 mg total) by mouth every 8 (eight) hours as needed for muscle spasms. 12/21/16  Yes [place], Betty G, MD  omeprazole (PRILOSEC) 40 MG capsule TAKE 1 CAPSULE(40 MG) BY MOUTH DAILY 03/14/17  Yes [place], Betty G, MD  oxybutynin (DITROPAN) 5 MG tablet Take 1 tablet by mouth 2 (two) times daily. 12/28/16  Yes [provider]  timolol (TIMOPTIC) 0.5 % ophthalmic solution Place 1 drop into both eyes every morning.   Yes [provider]  traMADol (ULTRAM) 50 MG tablet Take 50 mg as needed by mouth. 03/05/17  Yes [provider]  Vitamin D, Ergocalciferol, (DRISDOL) 50000 units CAPS capsule 1 cap weekly for 8 weeks then every 2 weeks. 12/26/16  Yes [place], Betty G, MD  albuterol (PROVENTIL) (2.5 MG/3ML) 0.083% nebulizer solution Take 3 mLs (2.5 mg total) by nebulization every 6 (six) hours as needed for wheezing or shortness of breath. 12/21/16   Rigoberto Noel, MD  butalbital-aspirin-caffeine [facility]) [phone] MG capsule Take 1 capsule by mouth 2 (two) times daily as needed for headache. Patient not taking: Reported on 03/15/2017 03/05/17   Robyn Haber, MD  predniSONE (DELTASONE) 10 MG tablet Take 5 tablets (50 mg total) daily for 5 days by mouth. 03/15/17 03/20/17  Kaisyn Reinhold, PA-C    Family History Family History  Problem Relation Age of Onset  . Pulmonary embolism Mother        Last year  . Mental retardation Mother        PTSD and fibromyalgia  . Heart disease Mother        CHF  . Congestive Heart Failure Mother   . Heart disease Father 60       CAD  . Hyperlipidemia Father   . Diabetes Paternal Grandmother   . Heart attack Paternal Grandmother   . Congestive Heart Failure Maternal Aunt   . Cancer Neg Hx     Social History Social History   Tobacco Use  . Smoking status: Never  Smoker  . Smokeless tobacco: Never Used  Substance Use Topics  . Alcohol use: Yes    Comment: Occas  . Drug use: No     Allergies   Other   Review of Systems Review of Systems  Respiratory: Positive for cough and shortness of breath.   Cardiovascular: Positive for chest pain.  Hematological: Bruises/bleeds easily.  All other systems reviewed and are negative.    Physical Exam Updated Vital Signs BP 136/87   Pulse 83   Temp 97.8 F (36.6 C) (Oral)   Resp 17   LMP 02/25/2017   SpO2 99%   Physical Exam  Constitutional: She is oriented to person, place, and time. She appears well-developed and  well-nourished. No distress.  HENT:  Head: Normocephalic and atraumatic.  Nose: Nose normal.  Mouth/Throat: Uvula is midline, oropharynx is clear and moist and mucous membranes are normal.  Eyes: Conjunctivae and EOM are normal. Pupils are equal, round, and reactive to light.  Neck: Normal range of motion.  Cardiovascular: Normal rate, regular rhythm and intact distal pulses.  Pulmonary/Chest: Effort normal and breath sounds normal. No stridor. No respiratory distress. She has no decreased breath sounds. She has no wheezes. She has no rales. She exhibits tenderness.  Speaking in full sentences without difficulty. Breathing easily. No signs of SOB. No accessory muscle use. Clear lung sounds in all fields.  TTP of anterior chest wall, worse along costochondral margin  Abdominal: Soft. She exhibits no distension and no mass. There is no tenderness. There is no guarding.  Musculoskeletal: Normal range of motion. She exhibits no edema or tenderness.  No tenderness, redness, or swelling of legs.   Lymphadenopathy:    She has no cervical adenopathy.  Neurological: She is alert and oriented to person, place, and time.  Skin: Skin is warm and dry. Capillary refill takes less than 2 seconds.  Psychiatric: She has a normal mood and affect.  Nursing note and vitals reviewed.    ED  Treatments / Results  Labs (all labs ordered are listed, but only abnormal results are displayed) Labs Reviewed  BASIC METABOLIC PANEL - Abnormal; Notable for the following components:      Result Value   Glucose, Bld 110 (*)    All other components within normal limits  CBC  I-STAT TROPONIN, ED    EKG  EKG Interpretation  Date/Time:  Tuesday March 15 2017 20:35:13 EST Ventricular Rate:  102 PR Interval:  154 QRS Duration: 74 QT Interval:  338 QTC Calculation: 440 R Axis:   -29 Text Interpretation:  Sinus tachycardia Inferior infarct , age undetermined Possible Anterior infarct , age undetermined Abnormal ECG Rate is faster Confirmed by Shanon Rosser [phone]) on 03/16/2017 1:35:34 PM       Radiology Dg Chest 2 View  Result Date: 03/15/2017 CLINICAL DATA:  Chest pain EXAM: CHEST  2 VIEW COMPARISON:  02/13/2017 FINDINGS: The heart size and mediastinal contours are within normal limits. Both lungs are clear. The visualized skeletal structures are unremarkable. IMPRESSION: No active cardiopulmonary disease. Electronically Signed   By: Donavan Foil M.D.   On: 03/15/2017 21:35    Procedures Procedures (including critical care time)  Medications Ordered in ED Medications - No data to display   Initial Impression / Assessment and Plan / ED Course  I have reviewed the triage vital signs and the nursing notes.  Pertinent labs & imaging results that were available during my care of the patient were reviewed by me and considered in my medical decision making (see chart for details).     Pt presenting with continued cough, CP and SOB. Physical exam pressuring, pt is not in any respiratory distress and she appears nontoxic. Lung exam reassuring, TTP along costochondral margins. Labs reassuring, troponin negative. SpO2 100% on RA. No tachycardia. No leg pain or swelling. Doubt cardiac cause of sxs. CXR negative for infection or infiltrate. As pt is still on eliquis and without other  risks for clotting, doubt new PE. She has been having sxs for months. Has close follow up with cardiology and pulmonology. Likely chest wall pain/costochondritis. As she cannot take NSAIDs, will rx prednisone and tx pain with tylenol. Pt to f/u with cardio/pulm/PCP as needed. At  this time, pt appears safe for discharge. Strict return precautions given. Pt states she understands and agrees to plan.   Final Clinical Impressions(s) / ED Diagnoses   Final diagnoses:  Atypical chest pain    ED Discharge Orders        Ordered    predniSONE (DELTASONE) 10 MG tablet  Daily     03/15/17 2232       Franchot Heidelberg, PA-C 03/16/17 2333    Orlie Dakin, MD 03/20/17 1253

## 2017-03-15 NOTE — Discharge Instructions (Signed)
Take prednisone as prescribed. Use Tylenol as needed for pain. Continue to take all your at home medications as prescribed. Follow-up with your cardiologist at your scheduled appointment. Return to the emergency room if you develop fevers, persistent chest pain, or any new or worsening symptoms.

## 2017-03-28 ENCOUNTER — Ambulatory Visit (INDEPENDENT_AMBULATORY_CARE_PROVIDER_SITE_OTHER): Payer: 59 | Admitting: Pulmonary Disease

## 2017-03-28 ENCOUNTER — Encounter: Payer: Self-pay | Admitting: Pulmonary Disease

## 2017-03-28 DIAGNOSIS — I2699 Other pulmonary embolism without acute cor pulmonale: Secondary | ICD-10-CM

## 2017-03-28 DIAGNOSIS — J45909 Unspecified asthma, uncomplicated: Secondary | ICD-10-CM

## 2017-03-28 MED ORDER — PREDNISONE 10 MG PO TABS: ORAL_TABLET | ORAL | 0 refills | Status: DC

## 2017-03-28 MED ORDER — AZITHROMYCIN 250 MG PO TABS: ORAL_TABLET | ORAL | 0 refills | Status: DC

## 2017-03-28 NOTE — Patient Instructions (Signed)
Prednisone 10 mg tabs  Take 2 tabs daily with food x 5ds, then 1 tab daily with food x 5ds then STOP  Zpak  

## 2017-03-28 NOTE — Assessment & Plan Note (Addendum)
Treat as asthmatic bronchitis  Prednisone 10 mg tabs  Take 2 tabs daily with food x 5ds, then 1 tab daily with food x 5ds then STOP  [place]

## 2017-03-28 NOTE — Progress Notes (Signed)
   Subjective:    Patient ID: Holly Mcdonald, female    DOB: [birth year], 41 y.o.   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  Venous Doppler was negative for DVT. She had no clear risk factors except for travel to the beach about 4 hour car ride. Her mother had a history of pulmonary embolism at age 74. She has seen hematology since and lifelong anti-correlation has been decided on. She reports frequent attacks of bronchitis in the long history of adult onset asthma for which she is maintained on Advair.  Spirometry on last visit showed normal lung function. She had an ER visit 11/6 and was given prednisone Dosepak She went on a cruise since then and now reports sinus drainage and cough productive of yellow sputum with increased wheezing. She is scheduled to see an ENT tomorrow Prednisone helps her symptoms and she wonders if she can stay on chronic dose of prednisone  I reviewed ER visit from 11/6, chest x-ray and blood work was normal. Repeat echo 01/2017 was reviewed which shows normal LV function and normal RV pressures     Significant tests/ events reviewed  10/2016 CT angiogram of the chest showed acute segmental pulmonary emboli in bilateral lower lobes, no CT evidence of right heart strain. Doppler lower extremity was negative for DVT.  Echo - nml LV fn Hypercoagulable workup neg her mother had PE 1 year ago D- dimer neg 11/25/16  Spirometry 12/2016 >. FEV 1 90%   Review of Systems neg for any significant sore throat, dysphagia, itching, sneezing, nasal congestion or excess/ purulent secretions, fever, chills, sweats, unintended wt loss, pleuritic or exertional cp, hempoptysis, orthopnea pnd or change in chronic leg swelling. Also denies presyncope, palpitations, heartburn,  abdominal pain, nausea, vomiting, diarrhea or change in bowel or urinary habits, dysuria,hematuria, rash, arthralgias, visual complaints, headache, numbness weakness or ataxia.     Objective:   Physical Exam   Gen. Pleasant, obese, in no distress ENT - no lesions, no post nasal drip Neck: No JVD, no thyromegaly, no carotid bruits Lungs: no use of accessory muscles, no dullness to percussion, decreased without rales or rhonchi  Cardiovascular: Rhythm regular, heart sounds  normal, no murmurs or gallops, no peripheral edema Musculoskeletal: No deformities, no cyanosis or clubbing , no tremors         Assessment & Plan:

## 2017-03-28 NOTE — Assessment & Plan Note (Signed)
Continue Eliquis lifelong. Main risk factor is obesity

## 2017-03-29 NOTE — Assessment & Plan Note (Signed)
Lifelong anticoagulation. Again main risk factor here is obesity

## 2017-03-30 ENCOUNTER — Encounter: Payer: Self-pay | Admitting: Gynecology

## 2017-03-30 ENCOUNTER — Ambulatory Visit (INDEPENDENT_AMBULATORY_CARE_PROVIDER_SITE_OTHER): Payer: 59 | Admitting: Gynecology

## 2017-03-30 VITALS — BP 124/78

## 2017-03-30 DIAGNOSIS — Z30430 Encounter for insertion of intrauterine contraceptive device: Secondary | ICD-10-CM

## 2017-03-30 NOTE — Patient Instructions (Signed)

## 2017-03-30 NOTE — Progress Notes (Signed)
    Helen-Marie Dayla Gasca Jun 26, 1975 [id number]        41 y.o.  [id number]  presents for Mirena IUD placement. She has read through the booklet, has no contraindications and signed the consent form. She currently is on a normal menses.  I reviewed the insertional process with her as well as the risks to include infection, either immediate or long-term, uterine perforation or migration requiring surgery to remove, other complications such as pain, hormonal side effects, infertility and possibility of failure with subsequent pregnancy.   Exam with [name] assistant Vitals:   03/30/17 1125  BP: 124/78    Pelvic: External BUS vagina normal. Cervix normal with menses flow. Uterus difficult to palpate without gross masses or tenderness. Adnexa without gross masses or tenderness.  Procedure: The cervix was cleansed with Betadine, anterior lip grasped with a single-tooth tenaculum, the uterus was sounded and a Mirena IUD was placed according to manufacturer's recommendations without difficulty. The strings were trimmed. The patient tolerated well and will follow up in one month for a postinsertional check.  Lot number:  [id number]    Anastasio Auerbach MD, 11:44 AM 03/30/2017

## 2017-04-01 ENCOUNTER — Ambulatory Visit: Payer: Self-pay | Admitting: *Deleted

## 2017-04-01 ENCOUNTER — Emergency Department (HOSPITAL_COMMUNITY): Payer: 59

## 2017-04-01 ENCOUNTER — Other Ambulatory Visit: Payer: Self-pay

## 2017-04-01 ENCOUNTER — Encounter (HOSPITAL_COMMUNITY): Payer: Self-pay | Admitting: Emergency Medicine

## 2017-04-01 ENCOUNTER — Emergency Department (HOSPITAL_COMMUNITY)
Admission: EM | Admit: 2017-04-01 | Discharge: 2017-04-01 | Disposition: A | Discharge status: Home / Self Care | Payer: 59 | Attending: Emergency Medicine | Admitting: Emergency Medicine

## 2017-04-01 DIAGNOSIS — J40 Bronchitis, not specified as acute or chronic: Secondary | ICD-10-CM | POA: Diagnosis not present

## 2017-04-01 DIAGNOSIS — J45909 Unspecified asthma, uncomplicated: Secondary | ICD-10-CM | POA: Insufficient documentation

## 2017-04-01 DIAGNOSIS — Z79899 Other long term (current) drug therapy: Secondary | ICD-10-CM | POA: Diagnosis not present

## 2017-04-01 DIAGNOSIS — R0982 Postnasal drip: Secondary | ICD-10-CM | POA: Insufficient documentation

## 2017-04-01 DIAGNOSIS — R05 Cough: Secondary | ICD-10-CM | POA: Diagnosis present

## 2017-04-01 DIAGNOSIS — Z7901 Long term (current) use of anticoagulants: Secondary | ICD-10-CM | POA: Insufficient documentation

## 2017-04-01 DIAGNOSIS — J320 Chronic maxillary sinusitis: Secondary | ICD-10-CM | POA: Diagnosis not present

## 2017-04-01 DIAGNOSIS — R042 Hemoptysis: Secondary | ICD-10-CM | POA: Insufficient documentation

## 2017-04-01 DIAGNOSIS — R000 Tachycardia, unspecified: Secondary | ICD-10-CM | POA: Diagnosis not present

## 2017-04-01 DIAGNOSIS — R0602 Shortness of breath: Secondary | ICD-10-CM | POA: Diagnosis not present

## 2017-04-01 DIAGNOSIS — Z86711 Personal history of pulmonary embolism: Secondary | ICD-10-CM | POA: Insufficient documentation

## 2017-04-01 DIAGNOSIS — R040 Epistaxis: Secondary | ICD-10-CM | POA: Diagnosis not present

## 2017-04-01 MED ORDER — HYDROCOD POLST-CPM POLST ER 10-8 MG/5ML PO SUER
5.0000 mL | Freq: Once | ORAL | Status: AC
  Administered 2017-04-01: 5 mL via ORAL
  Filled 2017-04-01: qty 5

## 2017-04-01 MED ORDER — ACETAMINOPHEN 500 MG PO TABS
1000.0000 mg | ORAL_TABLET | Freq: Once | ORAL | Status: AC
  Administered 2017-04-01: 1000 mg via ORAL
  Filled 2017-04-01: qty 2

## 2017-04-01 MED ORDER — HYDROCOD POLST-CPM POLST ER 10-8 MG/5ML PO SUER: 5.0000 mL | Freq: Two times a day (BID) | ORAL | 0 refills | Status: DC | PRN

## 2017-04-01 MED ORDER — IBUPROFEN 800 MG PO TABS
800.0000 mg | ORAL_TABLET | Freq: Once | ORAL | Status: DC
  Filled 2017-04-01: qty 1

## 2017-04-01 NOTE — ED Notes (Signed)
Pt in xray

## 2017-04-01 NOTE — Discharge Instructions (Signed)
Follow up with your ENT and pulmonologist.

## 2017-04-01 NOTE — ED Provider Notes (Signed)
[facility] Provider Note   CSN: [id number] Arrival date & time: 04/01/17  1238     History   Chief Complaint Chief Complaint  Patient presents with  . Epistaxis  . Cough    HPI [facility] Mcdonald is a 41 y.o. female.  41 yo F with a chief complaint of cough and sinus pain.  This been going on since June.  The patient is seen multiple providers for this.  She is on her third round of antibiotics for suspected sinusitis and bronchitis.  She has had an outpatient CT scan to evaluate for a possible obstruction to her sinus tract.  She denies fevers or chills.  She has had chronic chest pain since June when she was diagnosed with a PE.  She is on Eliquis.  She denies missing any doses.  She has had some streaks of blood in her cough.  She is also had a bloody nose.  This is resolved a couple days ago.  She denies any change to her chest pain.  Feels that her shortness of breath is mildly worse.   The history is provided by the patient.  Epistaxis    Cough  Associated symptoms include chest pain and shortness of breath. Pertinent negatives include no chills, no headaches, no rhinorrhea, no myalgias, no wheezing and no eye redness.  Illness  This is a chronic problem. The current episode started more than 1 week ago. The problem occurs constantly. The problem has been gradually worsening. Associated symptoms include chest pain and shortness of breath. Pertinent negatives include no headaches. Nothing aggravates the symptoms. Nothing relieves the symptoms. She has tried nothing for the symptoms. The treatment provided no relief.    Past Medical History:  Diagnosis Date  . Allergy   . Asthma   . Asthma in adult, unspecified asthma severity, uncomplicated 0/0/8676  . Bilateral pulmonary embolism ([place]) 11/03/2016  . Morbid obesity with BMI of 45.0-49.9, adult [facility])     Patient Active Problem List   Diagnosis Date Noted  . Allergic rhinitis  03/07/2017  . Hyperlipidemia 02/24/2017  . Pre-diabetes 02/24/2017  . Anxiety disorder 01/24/2017  . Fibromyalgia muscle pain 01/24/2017  . DOE (dyspnea on exertion) 01/13/2017  . Chest pain with low risk for cardiac etiology 01/13/2017  . Family history of premature CAD 01/13/2017  . GERD (gastroesophageal reflux disease) 12/21/2016  . Vitamin D deficiency, unspecified 12/21/2016  . Iron deficiency anemia due to chronic blood loss 12/20/2016  . Asthma in adult, unspecified asthma severity, uncomplicated 19/50/9326  . Obesity, Class III, BMI 40-49.9 (morbid obesity) ([place]) 11/04/2016  . Pulmonary embolus ([place])   . Bilateral pulmonary embolism ([place]) 11/03/2016    Past Surgical History:  Procedure Laterality Date  . GYNECOLOGIC CRYOSURGERY     Early 22s  . INTRAUTERINE DEVICE INSERTION  03/30/2017   Mirena  . RECTAL EXAMINATION UNDER ANESTHESIA W/ CYSTOSCOPY    . TONSILLECTOMY    . TRANSTHORACIC ECHOCARDIOGRAM  11/04/2016    (In setting of bilateral PE) mild LVH. Systolic function was normal.   EF 55-60%. Mild LA dilation. (poor windows)    OB History    Gravida Para Term Preterm AB Living   0 0 0 0 0 0   SAB TAB Ectopic Multiple Live Births   0 0 0 0 0       Home Medications    Prior to Admission medications   Medication Sig Start Date End Date Taking? Authorizing Provider  acetaminophen (TYLENOL) 650 MG CR tablet Take 650 mg by mouth every 8 (eight) hours as needed for pain.    [provider]  ADVAIR DISKUS 250-50 MCG/DOSE AEPB INHALE 1 PUFF INTO THE LUNGS TWICE DAILY 02/28/17   [place], Betty G, MD  albuterol (PROVENTIL HFA;VENTOLIN HFA) 108 (90 Base) MCG/ACT inhaler Inhale 1-2 puffs into the lungs every 6 (six) hours as needed for wheezing or shortness of breath. 12/07/16   [place], Betty G, MD  albuterol (PROVENTIL) (2.5 MG/3ML) 0.083% nebulizer solution Take 3 mLs (2.5 mg total) by nebulization every 6 (six) hours as needed for wheezing or shortness of breath.  12/21/16   Rigoberto Noel, MD  azithromycin (ZITHROMAX) 250 MG tablet Take 2 tablets on first day, then 1 tablet until finished. Patient not taking: Reported on 03/30/2017 03/28/17   Rigoberto Noel, MD  Benzonatate (TESSALON PO) Take by mouth.    [provider]  calcium carbonate (TUMS - DOSED IN MG ELEMENTAL CALCIUM) 500 MG chewable tablet Chew 1 tablet by mouth daily as needed.     [provider]  cetirizine (ZYRTEC) 10 MG tablet Take 10 mg by mouth daily.    [provider]  chlorpheniramine-HYDROcodone (TUSSIONEX PENNKINETIC ER) 10-8 MG/5ML SUER Take 5 mLs by mouth every 12 (twelve) hours as needed for cough. 04/01/17   Deno Etienne, DO  DULoxetine (CYMBALTA) 60 MG capsule Take 1 capsule (60 mg total) by mouth daily. 01/25/17   [place], Betty G, MD  ELIQUIS 5 MG TABS tablet TAKE 1 TABLET(5 MG) BY MOUTH TWICE DAILY 02/07/17   [place], Betty G, MD  fluticasone [facility]) 50 MCG/ACT nasal spray Place 1 spray into both nostrils daily. 01/07/17   [place], Betty G, MD  IRON PO Take by mouth.    [provider]  LevoFLOXacin (LEVAQUIN PO) Take by mouth.    [provider]  methocarbamol (ROBAXIN) 500 MG tablet Take 1 tablet (500 mg total) by mouth every 8 (eight) hours as needed for muscle spasms. 12/21/16   [place], Betty G, MD  omeprazole (PRILOSEC) 40 MG capsule TAKE 1 CAPSULE(40 MG) BY MOUTH DAILY 03/14/17   [place], Betty G, MD  oxybutynin (DITROPAN) 5 MG tablet Take 1 tablet by mouth 2 (two) times daily. 12/28/16   [provider]  predniSONE (DELTASONE) 10 MG tablet Take 2 tablets daily for first 5 days, then take 1 tablet daily for 5 days until finished. 03/28/17   Rigoberto Noel, MD  timolol (TIMOPTIC) 0.5 % ophthalmic solution Place 1 drop into both eyes every morning.    [provider]  traMADol (ULTRAM) 50 MG tablet Take 50 mg as needed by mouth. 03/05/17   [provider]  Vitamin D, Ergocalciferol, (DRISDOL) 50000 units CAPS  capsule 1 cap weekly for 8 weeks then every 2 weeks. 12/26/16   [place], Betty G, MD    Family History Family History  Problem Relation Age of Onset  . Pulmonary embolism Mother        Last year  . Mental retardation Mother        PTSD and fibromyalgia  . Heart disease Mother        CHF  . Congestive Heart Failure Mother   . Heart disease Father 5       CAD  . Hyperlipidemia Father   . Diabetes Paternal Grandmother   . Heart attack Paternal Grandmother   . Congestive Heart Failure Maternal Aunt   . Cancer Neg Hx  Social History Social History   Tobacco Use  . Smoking status: Never Smoker  . Smokeless tobacco: Never Used  Substance Use Topics  . Alcohol use: Yes    Comment: Occas  . Drug use: No     Allergies   Other   Review of Systems Review of Systems  Constitutional: Negative for chills and fever.  HENT: Positive for nosebleeds. Negative for congestion and rhinorrhea.   Eyes: Negative for redness and visual disturbance.  Respiratory: Positive for cough and shortness of breath. Negative for wheezing.   Cardiovascular: Positive for chest pain. Negative for palpitations.  Gastrointestinal: Negative for nausea and vomiting.  Genitourinary: Negative for dysuria and urgency.  Musculoskeletal: Negative for arthralgias and myalgias.  Skin: Negative for pallor and wound.  Neurological: Negative for dizziness and headaches.     Physical Exam Updated Vital Signs BP (!) 171/85   Pulse (!) 109   Temp 98.4 F (36.9 C) (Oral)   Resp 17   LMP 03/27/2017   SpO2 99%   Physical Exam  Constitutional: She is oriented to person, place, and time. She appears well-developed and well-nourished. No distress.  HENT:  Head: Normocephalic and atraumatic.  Swollen turbinates, posterior nasal drip, left maxillary sinus tenderness to percussion, tm normal bilaterally.    Eyes: EOM are normal. Pupils are equal, round, and reactive to light.  Neck: Normal range of motion.  Neck supple.  Cardiovascular: Normal rate and regular rhythm. Exam reveals no gallop and no friction rub.  No murmur heard. Pulmonary/Chest: Effort normal. She has no wheezes. She has no rales. She exhibits no tenderness.  Abdominal: Soft. She exhibits no distension. There is no tenderness.  Musculoskeletal: She exhibits no edema or tenderness.  Neurological: She is alert and oriented to person, place, and time.  Skin: Skin is warm and dry. She is not diaphoretic.  Psychiatric: She has a normal mood and affect. Her behavior is normal.  Nursing note and vitals reviewed.    ED Treatments / Results  Labs (all labs ordered are listed, but only abnormal results are displayed) Labs Reviewed - No data to display  EKG  EKG Interpretation  Date/Time:  Friday April 01 2017 16:32:00 EST Ventricular Rate:  108 PR Interval:    QRS Duration: 88 QT Interval:  333 QTC Calculation: 447 R Axis:   -18 Text Interpretation:  Sinus tachycardia Borderline left axis deviation Low voltage, extremity and precordial leads No significant change since last tracing Confirmed by Deno Etienne [phone]) on 04/01/2017 4:44:12 PM       Radiology Dg Chest 2 View  Result Date: 04/01/2017 CLINICAL DATA:  Patient with cough. Recent history of PE. Chest tightness. EXAM: CHEST  2 VIEW COMPARISON:  Chest radiograph 03/15/2017 FINDINGS: Monitoring leads overlie the patient. Stable cardiac and mediastinal contours. Minimal left basilar opacities favored to represent atelectasis. No pleural effusion or pneumothorax. Regional skeleton is unremarkable. IMPRESSION: No active cardiopulmonary disease. Electronically Signed   By: Lovey Newcomer M.D.   On: 04/01/2017 16:58    Procedures Procedures (including critical care time)  Medications Ordered in ED Medications  chlorpheniramine-HYDROcodone (TUSSIONEX) 10-8 MG/5ML suspension 5 mL (not administered)  acetaminophen (TYLENOL) tablet 1,000 mg (not administered)  ibuprofen  (ADVIL,MOTRIN) tablet 800 mg (800 mg Oral Not Given 04/01/17 1704)     Initial Impression / Assessment and Plan / ED Course  I have reviewed the triage vital signs and the nursing notes.  Pertinent labs & imaging results that were available during my care  of the patient were reviewed by me and considered in my medical decision making (see chart for details).     41 yo F with a chief complaint of cough and congestion.  This been going on for the past 5 or 6 months.  She thinks she may have pneumonia and would like a CT scan of her face to save her the trip to get the outpatient study done.  On my exam the patient is well-appearing and nontoxic.  She has a mild tachycardia.  She has clear lung sounds.  She has signs of an upper respiratory illness.  I doubt that this is a PE as a sounds like a viral upper respiratory syndrome.  The patient has been seen multiple times for this by her pulmonologist as well as ENT.  I discussed with her that I feel the right imaging study would be with her ear nose and throat physician.  Will obtain a chest x-ray to rule out pneumonia while on antibiotics.  Xray negative.  Will have her follow up as outpatient.  Given cough medicine.   5:06 PM:  I have discussed the diagnosis/risks/treatment options with the patient and believe the pt to be eligible for discharge home to follow-up with PCP. We also discussed returning to the ED immediately if new or worsening sx occur. We discussed the sx which are most concerning (e.g., sudden worsening pain, fever, inability to tolerate by mouth) that necessitate immediate return. Medications administered to the patient during their visit and any new prescriptions provided to the patient are listed below.  Medications given during this visit Medications  chlorpheniramine-HYDROcodone (TUSSIONEX) 10-8 MG/5ML suspension 5 mL (not administered)  acetaminophen (TYLENOL) tablet 1,000 mg (not administered)  ibuprofen (ADVIL,MOTRIN)  tablet 800 mg (800 mg Oral Not Given 04/01/17 1704)     The patient appears reasonably screen and/or stabilized for discharge and I doubt any other medical condition or other [facility] requiring further screening, evaluation, or treatment in the ED at this time prior to discharge.    Final Clinical Impressions(s) / ED Diagnoses   Final diagnoses:  Bronchitis  Chronic maxillary sinusitis    ED Discharge Orders        Ordered    chlorpheniramine-HYDROcodone (TUSSIONEX PENNKINETIC ER) 10-8 MG/5ML SUER  Every 12 hours PRN     04/01/17 [place], Kay, DO 04/01/17 1706

## 2017-04-01 NOTE — Telephone Encounter (Signed)
Pt taking blood thinners, having some nose bleeds with blowing her nose and coughing up a little blood. She has a cold so she is coughing up some mucous. Care advice given. Going to the ED.  Reason for Disposition . Taking Coumadin (warfarin) or other strong blood thinner, or known bleeding disorder (e.g., thrombocytopenia)  Answer Assessment - Initial Assessment Questions 1. AMOUNT OF BLEEDING: "How bad is the bleeding?" "How much blood was lost?" "Has the bleeding stopped?"   - MILD: needed a couple tissues   - MODERATE: needed many tissues   - SEVERE: large blood clots, soaked many tissues, lasted more than 30 minutes      Little bit when blowing nose 2. ONSET: "When did the nosebleed start?"      today 3. FREQUENCY: "How many nosebleeds have you had in the last 24 hours?"      no 4. RECURRENT SYMPTOMS: "Have there been other recent nosebleeds?" If so, ask: "How long did it take you to stop the bleeding?" "What worked best?"      no 5. CAUSE: "What do you think caused this nosebleed?"     dont know 6. LOCAL FACTORS: "Do you have any cold symptoms?", "Have you been rubbing or picking at your nose?"     no 7. SYSTEMIC FACTORS: "Do you have high blood pressure or any bleeding problems?"     no 8. BLOOD THINNERS: "Do you take any blood thinners?" (e.g., coumadin, heparin, aspirin, Plavix)     Yes, Eliqius 9. OTHER SYMPTOMS: "Do you have any other symptoms?" (e.g., lightheadedness)     Brief yesterday lightheadedness 10. PREGNANCY: "Is there any chance you are pregnant?" "When was your last menstrual period?"       No, LMP now  Protocols used: NOSEBLEED-A-AH

## 2017-04-01 NOTE — ED Triage Notes (Addendum)
Pt states, "There was blood when I blew my nose this morning and big dots of blood when I cough." Pt reports known bronchitis, sinus infection, reports being on levaquin, prednisone and eliquis for past PE. Pt c/o chest tightness, reports concerned for pneumonia. Resp e/u.

## 2017-04-04 ENCOUNTER — Other Ambulatory Visit: Payer: 59

## 2017-04-04 ENCOUNTER — Encounter: Payer: Self-pay | Admitting: Cardiology

## 2017-04-04 ENCOUNTER — Other Ambulatory Visit (HOSPITAL_BASED_OUTPATIENT_CLINIC_OR_DEPARTMENT_OTHER): Payer: 59

## 2017-04-04 ENCOUNTER — Ambulatory Visit (INDEPENDENT_AMBULATORY_CARE_PROVIDER_SITE_OTHER): Payer: 59 | Admitting: Cardiology

## 2017-04-04 VITALS — BP 118/98 | HR 110 | Ht 70.0 in | Wt 333.0 lb

## 2017-04-04 DIAGNOSIS — I2699 Other pulmonary embolism without acute cor pulmonale: Secondary | ICD-10-CM | POA: Diagnosis not present

## 2017-04-04 DIAGNOSIS — D509 Iron deficiency anemia, unspecified: Secondary | ICD-10-CM

## 2017-04-04 DIAGNOSIS — R0600 Dyspnea, unspecified: Secondary | ICD-10-CM

## 2017-04-04 DIAGNOSIS — M940 Chondrocostal junction syndrome [Tietze]: Secondary | ICD-10-CM

## 2017-04-04 DIAGNOSIS — R0609 Other forms of dyspnea: Secondary | ICD-10-CM | POA: Diagnosis not present

## 2017-04-04 DIAGNOSIS — D500 Iron deficiency anemia secondary to blood loss (chronic): Secondary | ICD-10-CM

## 2017-04-04 LAB — IRON AND TIBC
%SAT: 14 % — ABNORMAL LOW (ref 21–57)
Iron: 48 ug/dL (ref 41–142)
TIBC: 350 ug/dL (ref 236–444)
UIBC: 302 ug/dL (ref 120–384)

## 2017-04-04 LAB — FERRITIN: Ferritin: 28 ng/mL (ref 9–269)

## 2017-04-04 NOTE — Progress Notes (Signed)
PCP: Martinique, Betty G, MD  Clinic Note: Chief Complaint  Patient presents with  . Follow-up    Coronary calcium score to evaluate chest pain  . Chest Pain    Costochondritis    HPI: Holly Mcdonald is a 41 y.o. female who is being seen today for the evaluation of chest pain and shortness of breath at the request of Martinique, Betty G, MD.  [name] was last seen on January 13, 2017 with multiple complaints of chest pain and shortness of breath.  Recent Hospitalizations: ER visits  ER visits on 02/13/2017, 03/05/2017, 03/15/2017 and 04/01/2017. -For cough, sinusitis, bronchitis etc.  One time she had costochondritis symptoms and was treated with a steroid burst.  This definitely helped her pain, but now it has recurred as she continues to cough.  Studies Personally Reviewed - (if available, images/films reviewed: From Epic Chart or Care Everywhere)  2 D Echo 9/10: Normal LV size and function.  EF 60-65%.  Normal wall motion.  Normal valves. PAP ~20 mmHg  Coronary Calcium Score 9/10: 29, linear calcium noted in pRCA.  Interval History: [name] returns today for follow-up, indicating that she was quite happy to hear about her coronary calcium score.  She is somewhat relieved to be able to exclude CAD as an etiology for chest pain.  She now notes that it definitely was improved with steroid taper, and thinks it is probably related to her coughing and fibromyalgia pains. She still has congestion, coughing and headaches along with chest pains from recurrent bouts of bronchitis etc. from sinusitis.  Apparently she is to see ENT about possible sinus surgery.  She still has exertional dyspnea, but is attributed that more to her deconditioning.  No real PND or orthopnea --will wake up having to cough and be short of breath, but not true orthopnea..  No edema. She has clear-cut musculoskeletal type discomfort in her chest from coughing, but no chest tightness or pressure with  exertion. No TIA or amaurosis fugax symptoms.  No syncope or near ischemic symptoms but she does get lightheaded sometimes with coughing.  No rapid irregular heartbeats or palpitations.  No melena, hematochezia, hematuria, or epstaxis. No claudication.   ROS: A comprehensive was performed. Review of Systems  Constitutional: Positive for malaise/fatigue. Negative for chills, fever and weight loss.  HENT: Positive for congestion and sinus pain. Negative for nosebleeds.   Respiratory: Positive for cough, sputum production and shortness of breath.   Gastrointestinal: Positive for abdominal pain and nausea. Negative for constipation.  Musculoskeletal: Positive for joint pain and myalgias.  Skin: Negative.   Neurological: Negative for dizziness and focal weakness.  Endo/Heme/Allergies: Positive for environmental allergies.  Psychiatric/Behavioral: The patient is nervous/anxious.   All other systems reviewed and are negative.  I have reviewed and (if needed) personally updated the patient's problem list, medications, allergies, past medical and surgical history, social and family history.   Past Medical History:  Diagnosis Date  . Allergy   . Asthma   . Asthma in adult, unspecified asthma severity, uncomplicated 12/13/7617  . Bilateral pulmonary embolism ([place]) 11/03/2016  . Morbid obesity with BMI of 45.0-49.9, adult [facility])     Past Surgical History:  Procedure Laterality Date  . GYNECOLOGIC CRYOSURGERY     Early 93s  . INTRAUTERINE DEVICE INSERTION  03/30/2017   Mirena  . RECTAL EXAMINATION UNDER ANESTHESIA W/ CYSTOSCOPY    . TONSILLECTOMY    . TRANSTHORACIC ECHOCARDIOGRAM  11/04/2016    (In setting of  bilateral PE) mild LVH. Systolic function was normal.   EF 55-60%. Mild LA dilation. (poor windows)    Current Meds  Medication Sig  . acetaminophen (TYLENOL) 650 MG CR tablet Take 650 mg by mouth every 8 (eight) hours as needed for pain.  Marland Kitchen ADVAIR DISKUS 250-50 MCG/DOSE AEPB INHALE  1 PUFF INTO THE LUNGS TWICE DAILY  . albuterol (PROVENTIL HFA;VENTOLIN HFA) 108 (90 Base) MCG/ACT inhaler Inhale 1-2 puffs into the lungs every 6 (six) hours as needed for wheezing or shortness of breath.  Marland Kitchen albuterol (PROVENTIL) (2.5 MG/3ML) 0.083% nebulizer solution Take 3 mLs (2.5 mg total) by nebulization every 6 (six) hours as needed for wheezing or shortness of breath.  . Benzonatate (TESSALON PO) Take by mouth.  . calcium carbonate (TUMS - DOSED IN MG ELEMENTAL CALCIUM) 500 MG chewable tablet Chew 1 tablet by mouth daily as needed.   . cetirizine (ZYRTEC) 10 MG tablet Take 10 mg by mouth daily.  . chlorpheniramine-HYDROcodone (TUSSIONEX PENNKINETIC ER) 10-8 MG/5ML SUER Take 5 mLs by mouth every 12 (twelve) hours as needed for cough.  . DULoxetine (CYMBALTA) 60 MG capsule Take 1 capsule (60 mg total) by mouth daily.  Marland Kitchen ELIQUIS 5 MG TABS tablet TAKE 1 TABLET(5 MG) BY MOUTH TWICE DAILY  . fluticasone (FLONASE) 50 MCG/ACT nasal spray Place 1 spray into both nostrils daily.  . IRON PO Take by mouth.  . LevoFLOXacin (LEVAQUIN PO) Take by mouth.  . methocarbamol (ROBAXIN) 500 MG tablet Take 1 tablet (500 mg total) by mouth every 8 (eight) hours as needed for muscle spasms.  Marland Kitchen omeprazole (PRILOSEC) 40 MG capsule TAKE 1 CAPSULE(40 MG) BY MOUTH DAILY  . oxybutynin (DITROPAN) 5 MG tablet Take 1 tablet by mouth 2 (two) times daily.  . predniSONE (DELTASONE) 10 MG tablet Take 2 tablets daily for first 5 days, then take 1 tablet daily for 5 days until finished.  . timolol (TIMOPTIC) 0.5 % ophthalmic solution Place 1 drop into both eyes every morning.  . traMADol (ULTRAM) 50 MG tablet Take 50 mg as needed by mouth.  . Vitamin D, Ergocalciferol, (DRISDOL) 50000 units CAPS capsule 1 cap weekly for 8 weeks then every 2 weeks.    Allergies  Allergen Reactions  . Other Nausea And Vomiting    Darvocet N-100    Social History   Socioeconomic History  . Marital status: Divorced    Spouse name: None    . Number of children: None  . Years of education: None  . Highest education level: None  Social Needs  . Financial resource strain: None  . Food insecurity - worry: None  . Food insecurity - inability: None  . Transportation needs - medical: None  . Transportation needs - non-medical: None  Occupational History  . None  Tobacco Use  . Smoking status: Never Smoker  . Smokeless tobacco: Never Used  Substance and Sexual Activity  . Alcohol use: Yes    Comment: Occas  . Drug use: No  . Sexual activity: Yes    Birth control/protection: IUD    Comment: 1st intercourse 41 yo-More than 5 partners  Mirena 03/30/2017  Other Topics Concern  . None  Social History Narrative  . None    family history includes Congestive Heart Failure in her maternal aunt and mother; Diabetes in her paternal grandmother; Heart attack in her paternal grandmother; Heart disease in her mother; Heart disease (age of onset: 43) in her father; Hyperlipidemia in her father; Mental retardation in her  mother; Pulmonary embolism in her mother.  Wt Readings from Last 3 Encounters:  04/04/17 (!) 333 lb (151 kg)  03/28/17 (!) 334 lb (151.5 kg)  03/03/17 (!) 336 lb (152.4 kg)    PHYSICAL EXAM BP (!) 118/98   Pulse (!) 110   Ht 5\' 10"  (1.778 m)   Wt (!) 333 lb (151 kg)   LMP 03/27/2017   SpO2 98%   BMI 47.78 kg/m  Physical Exam  Constitutional: She is oriented to person, place, and time. No distress.  Morbidly obese.  No acute distress.  Chronically fatigued appearing  HENT:  Head: Normocephalic and atraumatic.  Neck: Normal range of motion. Neck supple. No hepatojugular reflux and no JVD (Unable to assess) present. Carotid bruit is not present.  Cardiovascular: Normal rate, regular rhythm, S1 normal, S2 normal and normal pulses.  Occasional extrasystoles are present. PMI is not displaced (unable to assess). Exam reveals distant heart sounds. Exam reveals no gallop.  No murmur heard. Pulmonary/Chest: Effort  normal. No respiratory distress. She has wheezes (Faint expiratory). She has no rales.  Distant breath sounds. She continues to have significant chest wall tenderness along both sides the sternal margin, worse on the left.  Pain reproduced with palpation and with deep inspiration.  Musculoskeletal: Normal range of motion. She exhibits edema (Trivial).  Neurological: She is alert and oriented to person, place, and time.  Psychiatric: She has a normal mood and affect. Her behavior is normal. Judgment and thought content normal.  Nursing note and vitals reviewed.   Adult ECG Report Not checked  Other studies Reviewed: Additional studies/ records that were reviewed today include:  Recent Labs:    Lab Results  Component Value Date   CREATININE 0.68 03/15/2017   BUN 8 03/15/2017   NA 138 03/15/2017   K 3.8 03/15/2017   CL 103 03/15/2017   CO2 26 03/15/2017   Lab Results  Component Value Date   CHOL 179 02/25/2017   HDL 33.30 (L) 02/25/2017   LDLCALC 119 (H) 02/25/2017   TRIG 135.0 02/25/2017   CHOLHDL 5 02/25/2017    ASSESSMENT / PLAN: Problem List Items Addressed This Visit    Bilateral pulmonary embolism (HCC) (Chronic)    Follow-up echocardiogram suggested normal LV size and function as well as normal RV size and pressures.      Costochondritis - Primary (Chronic)    Chest pain is most consistent with costochondritis.  Reproducible on exam and with deep inspiration.  Exacerbated by fibromyalgia. Very low risk coronary calcium score in a young woman-would suggest very low likelihood of this being related to coronary artery disease.  It is not exertional.  It seems like the steroids seem to be the best way to treat this.  She is reluctant to use NSAIDs, however I said for breakthrough pain high-dose Motrin will be warranted. Until she has her coughing resolved, she will continue to have this chest pain.      DOE (dyspnea on exertion)    Normal echocardiogram and normal pump  function/EF and diastolic function.  No evidence of pulmonary hypertension either.  Most likely consistent with obesity, deconditioning and potentially OSA/OHS.  -Very low likelihood of having CAD with very low coronary calcium score.      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  I think losing weight would certainly help her costochondritis as well.         Current medicines are  reviewed at length with the patient today. (+/- concerns) n/a The following changes have been made: n/a  Patient Instructions  Medication Instructions:  Your physician recommends that you continue on your current medications as directed. Please refer to the Current Medication list given to you today.  Follow-Up: AS NEEDED with Dr. Ellyn Hack.  Studies Ordered:   No orders of the defined types were placed in this encounter.     Glenetta Hew, M.D., M.S. Interventional Cardiologist   Pager # [phone] Phone # [phone] [address] [place], [place] [address]

## 2017-04-04 NOTE — Patient Instructions (Addendum)
Medication Instructions:  Your physician recommends that you continue on your current medications as directed. Please refer to the Current Medication list given to you today.  Follow-Up: AS NEEDED with Dr. Ellyn Hack.

## 2017-04-05 ENCOUNTER — Other Ambulatory Visit (INDEPENDENT_AMBULATORY_CARE_PROVIDER_SITE_OTHER): Payer: Self-pay | Admitting: Otolaryngology

## 2017-04-05 DIAGNOSIS — J329 Chronic sinusitis, unspecified: Secondary | ICD-10-CM

## 2017-04-06 ENCOUNTER — Encounter: Payer: Self-pay | Admitting: Cardiology

## 2017-04-06 ENCOUNTER — Ambulatory Visit
Admission: RE | Admit: 2017-04-06 | Discharge: 2017-04-06 | Disposition: A | Discharge status: Home / Self Care | Payer: 59 | Source: Ambulatory Visit | Attending: Otolaryngology | Admitting: Otolaryngology

## 2017-04-06 DIAGNOSIS — J329 Chronic sinusitis, unspecified: Secondary | ICD-10-CM

## 2017-04-06 DIAGNOSIS — Z86718 Personal history of other venous thrombosis and embolism: Secondary | ICD-10-CM | POA: Insufficient documentation

## 2017-04-06 NOTE — Assessment & Plan Note (Signed)
Normal echocardiogram and normal pump function/EF and diastolic function.  No evidence of pulmonary hypertension either.  Most likely consistent with obesity, deconditioning and potentially OSA/OHS.  -Very low likelihood of having CAD with very low coronary calcium score.

## 2017-04-06 NOTE — Assessment & Plan Note (Signed)
Chest pain is most consistent with costochondritis.  Reproducible on exam and with deep inspiration.  Exacerbated by fibromyalgia. Very low risk coronary calcium score in a young woman-would suggest very low likelihood of this being related to coronary artery disease.  It is not exertional.  It seems like the steroids seem to be the best way to treat this.  She is reluctant to use NSAIDs, however I said for breakthrough pain high-dose Motrin will be warranted. Until she has her coughing resolved, she will continue to have this chest pain.

## 2017-04-06 NOTE — Progress Notes (Signed)
[name]  Telephone:([phone]) [phone] Fax:([phone]) [phone]  Clinic Follow Up Note   Patient Care Team: Martinique, Betty G, MD as PCP - General (Family Medicine) 04/07/2017   Referring physician: Martinique, Betty G, MD   CHIEF COMPLAINTS:  F/u IDA  HISTORY OF PRESENTING ILLNESS: 12/20/16 Holly Mcdonald 41 y.o. female is here because of her recent pulmonary embolism. She was referred by her primary care physician Dr. Martinique, she presents to my clinic by herself today.   She thought she had bronchitis which she has now for her birthday vacation on 6/15th. Then she developed chest pain in the id chest a few days later. It hurt her to move. She went back to work on 6/25th. She went to work the next day and she went to urgent care and saw an ER doctor. He did a EKG. She went to ED at [facility] and a CT was done. She was on a heparin drip in hospital. Now she is on Eliquis one twice a day. Since hospitalization she still has chest pain. The pain it reduced but increased due to bronchitis. She had steroid shot earlier this month and the pain had stopped from that. She can feel when her clot breaks. The pain is more of a 5/10 than the original 15/10 in June.   She is always SOB. She does not have much of  Cough.   She has bladder spasms and she is prediabetic and suspect glaucoma. Dr. Elsworth Soho tested her lung function which came back clear.   She has had hip pain since May and she has bilateral lower leg swelling that can become painful. She is in PT for her right hip pain. Her mother has had clots 1 year ago. And her father died of a heart attack. She has been overweight for a long time. She has heavy flow regular periods. She stopped taking birth control in 2017.   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  She was evaluate by pulmonologist Dr. Elsworth Soho on 12/10/2016, who pulmonary function test was normal, she did not feel that this was helpful.    CURRENT  THERAPY: Eliquis 5mg  twice daily, hold for 2-3 days at start of period. Oral iron     INTERVAL HISTORY:  Holly Mcdonald is here for a follow up. She was last seen by me in 12/2016. She presents to the clinic today noting she is doing well. She notes she is still taking oral iron. She notes she just got her new IUD and hope this will lighten her menorrhagia. She notes she has inflammation on her chest wall and she is on Cymbalta for that to manage it. Her chest pain still persists.     MEDICAL HISTORY:  Past Medical History:  Diagnosis Date  . Allergy   . Asthma   . Asthma in adult, unspecified asthma severity, uncomplicated 12/16/2117  . Bilateral pulmonary embolism ([place]) 11/03/2016  . Morbid obesity with BMI of 45.0-49.9, adult [facility])     SURGICAL HISTORY: Past Surgical History:  Procedure Laterality Date  . GYNECOLOGIC CRYOSURGERY     Early 43s  . INTRAUTERINE DEVICE INSERTION  03/30/2017   Mirena  . RECTAL EXAMINATION UNDER ANESTHESIA W/ CYSTOSCOPY    . TONSILLECTOMY    . TRANSTHORACIC ECHOCARDIOGRAM  11/04/2016    (In setting of bilateral PE) mild LVH. Systolic function was normal.   EF 55-60%. Mild LA dilation. (poor windows)    SOCIAL HISTORY: Social  History   Socioeconomic History  . Marital status: Divorced    Spouse name: Not on file  . Number of children: Not on file  . Years of education: Not on file  . Highest education level: Not on file  Social Needs  . Financial resource strain: Not on file  . Food insecurity - worry: Not on file  . Food insecurity - inability: Not on file  . Transportation needs - medical: Not on file  . Transportation needs - non-medical: Not on file  Occupational History  . Not on file  Tobacco Use  . Smoking status: Never Smoker  . Smokeless tobacco: Never Used  Substance and Sexual Activity  . Alcohol use: Yes    Comment: Occas  . Drug use: No  . Sexual activity: Yes    Birth control/protection: IUD    Comment: 1st  intercourse 41 yo-More than 5 partners  Mirena 03/30/2017  Other Topics Concern  . Not on file  Social History Narrative  . Not on file    FAMILY HISTORY: Family History  Problem Relation Age of Onset  . Pulmonary embolism Mother        Last year  . Mental retardation Mother        PTSD and fibromyalgia  . Heart disease Mother        CHF  . Congestive Heart Failure Mother   . Heart disease Father 67       CAD  . Hyperlipidemia Father   . Diabetes Paternal Grandmother   . Heart attack Paternal Grandmother   . Congestive Heart Failure Maternal Aunt   . Cancer Neg Hx     ALLERGIES:  is allergic to other.  MEDICATIONS:  Current Outpatient Medications  Medication Sig Dispense Refill  . acetaminophen (TYLENOL) 650 MG CR tablet Take 650 mg by mouth every 8 (eight) hours as needed for pain.    Marland Kitchen ADVAIR DISKUS 250-50 MCG/DOSE AEPB INHALE 1 PUFF INTO THE LUNGS TWICE DAILY 60 each 6  . albuterol (PROVENTIL HFA;VENTOLIN HFA) 108 (90 Base) MCG/ACT inhaler Inhale 1-2 puffs into the lungs every 6 (six) hours as needed for wheezing or shortness of breath. 18 Mcdonald 2  . albuterol (PROVENTIL) (2.5 MG/3ML) 0.083% nebulizer solution Take 3 mLs (2.5 mg total) by nebulization every 6 (six) hours as needed for wheezing or shortness of breath. 360 mL 5  . Benzonatate (TESSALON PO) Take by mouth.    . calcium carbonate (TUMS - DOSED IN MG ELEMENTAL CALCIUM) 500 MG chewable tablet Chew 1 tablet by mouth daily as needed.     . cetirizine (ZYRTEC) 10 MG tablet Take 10 mg by mouth daily.    . chlorpheniramine-HYDROcodone (TUSSIONEX PENNKINETIC ER) 10-8 MG/5ML SUER Take 5 mLs by mouth every 12 (twelve) hours as needed for cough. 25 mL 0  . DULoxetine (CYMBALTA) 60 MG capsule Take 1 capsule (60 mg total) by mouth daily. 90 capsule 1  . ELIQUIS 5 MG TABS tablet TAKE 1 TABLET(5 MG) BY MOUTH TWICE DAILY 60 tablet 2  . fluticasone (FLONASE) 50 MCG/ACT nasal spray Place 1 spray into both nostrils daily. 16 Mcdonald 3  .  IRON PO Take by mouth.    . LevoFLOXacin (LEVAQUIN PO) Take by mouth.    . methocarbamol (ROBAXIN) 500 MG tablet Take 1 tablet (500 mg total) by mouth every 8 (eight) hours as needed for muscle spasms. 45 tablet 1  . omeprazole (PRILOSEC) 40 MG capsule TAKE 1 CAPSULE(40 MG) BY MOUTH DAILY  30 capsule 0  . oxybutynin (DITROPAN) 5 MG tablet Take 1 tablet by mouth 2 (two) times daily.  11  . predniSONE (DELTASONE) 10 MG tablet Take 2 tablets daily for first 5 days, then take 1 tablet daily for 5 days until finished. 15 tablet 0  . timolol (TIMOPTIC) 0.5 % ophthalmic solution Place 1 drop into both eyes every morning.    . traMADol (ULTRAM) 50 MG tablet Take 50 mg as needed by mouth.  0  . Vitamin D, Ergocalciferol, (DRISDOL) 50000 units CAPS capsule 1 cap weekly for 8 weeks then every 2 weeks. 12 capsule 2   No current facility-administered medications for this visit.     REVIEW OF SYSTEMS:   Constitutional: Denies fevers, chills or abnormal night sweats Eyes: Denies blurriness of vision, double vision or watery eyes Ears, nose, mouth, throat, and face: Denies mucositis or sore throat Respiratory: (+) SOB on exesion and talking. (+) chest pain  (+) asthma/bronchitis  Cardiovascular: Denies palpitation, chest discomfort (+) lower extremity swelling Gastrointestinal:  Denies nausea, heartburn or change in bowel habits Skin: Denies abnormal skin rashes Lymphatics: Denies new lymphadenopathy or easy bruising Neurological:Denies numbness, tingling or new weaknesses MAK: (+) fibromyalgia  Behavioral/Psych: Mood is stable, no new changes  All other systems were reviewed with the patient and are negative.  PHYSICAL EXAMINATION: ECOG PERFORMANCE STATUS: 1 - Symptomatic but completely ambulatory  Vitals:   04/07/17 0838  BP: (!) 112/53  Pulse: 83  Resp: 20  Temp: 98.4 F (36.9 C)  SpO2: 97%   Filed Weights   04/07/17 0838  Weight: (!) 336 lb (152.4 kg)    GENERAL:alert, no distress and  comfortable SKIN: skin color, texture, turgor are normal, no rashes or significant lesions EYES: normal, conjunctiva are pink and non-injected, sclera clear OROPHARYNX:no exudate, no erythema and lips, buccal mucosa, and tongue normal  NECK: supple, thyroid normal size, non-tender, without nodularity LYMPH:  no palpable lymphadenopathy in the cervical, axillary or inguinal LUNGS: clear to auscultation and percussion with normal breathing effort HEART: regular rate & rhythm and no murmurs and no lower extremity edema ABDOMEN:abdomen soft, non-tender and normal bowel sounds Musculoskeletal:no cyanosis of digits and no clubbing  PSYCH: alert & oriented x 3 with fluent speech NEURO: no focal motor/sensory deficits  LABORATORY DATA:  I have reviewed the data as listed CBC Latest Ref Rng & Units 03/15/2017 02/25/2017 02/13/2017  WBC 4.0 - 10.5 K/uL 7.8 5.2 8.3  Hemoglobin 12.0 - 15.0 Mcdonald/dL 13.1 12.9 13.0  Hematocrit 36.0 - 46.0 % 39.9 39.5 40.3  Platelets 150 - 400 K/uL 262 239.0 249    CMP Latest Ref Rng & Units 03/15/2017 02/13/2017 12/16/2016  Glucose 65 - 99 mg/dL 110(H) 105(H) 103(H)  BUN 6 - 20 mg/dL 8 8 9   Creatinine 0.44 - 1.00 mg/dL 0.68 0.67 0.64  Sodium 135 - 145 mmol/L 138 137 137  Potassium 3.5 - 5.1 mmol/L 3.8 3.9 3.8  Chloride 101 - 111 mmol/L 103 106 106  CO2 22 - 32 mmol/L 26 24 23   Calcium 8.9 - 10.3 mg/dL 9.4 9.1 9.3  Total Protein 6.5 - 8.1 Mcdonald/dL - - 6.9  Total Bilirubin 0.3 - 1.2 mg/dL - - 0.5  Alkaline Phos 38 - 126 U/L - - 50  AST 15 - 41 U/L - - 26  ALT 14 - 54 U/L - - 18     PROCEDURES  ECHO 11/04/16 Study Conclusions - Left ventricle: The cavity size was normal. Wall thickness  was   increased in a pattern of mild LVH. Systolic function was normal.   The estimated ejection fraction was in the range of 55% to 60%. - Left atrium: The atrium was mildly dilated. Indications:      Pulmonary embolus 415.19.    RADIOGRAPHIC STUDIES: I have personally reviewed  the radiological images as listed and agreed with the findings in the report. Dg Chest 2 View  Result Date: 04/01/2017 CLINICAL DATA:  Patient with cough. Recent history of PE. Chest tightness. EXAM: CHEST  2 VIEW COMPARISON:  Chest radiograph 03/15/2017 FINDINGS: Monitoring leads overlie the patient. Stable cardiac and mediastinal contours. Minimal left basilar opacities favored to represent atelectasis. No pleural effusion or pneumothorax. Regional skeleton is unremarkable. IMPRESSION: No active cardiopulmonary disease. Electronically Signed   By: Lovey Newcomer M.D.   On: 04/01/2017 16:58   Dg Chest 2 View  Result Date: 03/15/2017 CLINICAL DATA:  Chest pain EXAM: CHEST  2 VIEW COMPARISON:  02/13/2017 FINDINGS: The heart size and mediastinal contours are within normal limits. Both lungs are clear. The visualized skeletal structures are unremarkable. IMPRESSION: No active cardiopulmonary disease. Electronically Signed   By: Donavan Foil M.D.   On: 03/15/2017 21:35   Ct Maxillofacial Wo Contrast  Result Date: 04/06/2017 CLINICAL DATA:  41 year old female with multiple episodes of sinusitis symptoms for 1 month status post antibiotics. EXAM: CT MAXILLOFACIAL WITHOUT CONTRAST TECHNIQUE: Multidetector CT images of the paranasal sinuses were obtained using the standard protocol without intravenous contrast. COMPARISON:  None. FINDINGS: Paranasal sinuses: Frontal: Mildly hyperplastic. Normally aerated. Patent frontal sinus drainage pathways. Ethmoid: Normally aerated. Maxillary: Right maxillary sinus is clear aside from mild peripheral mucosal thickening which is occasionally polypoid (series 2, image 146). Left maxillary sinus is clear aside from a 7 mm polypoid area along the roof near the ostium (coronal image 25). Sphenoid: Normally aerated. Patent sphenoethmoidal recesses. Right ostiomeatal unit: Patent despite mild mucosal thickening (coronal image 24). Left ostiomeatal unit: Patent (coronal image 23).  Nasal passages: Mild retained secretions in the bilateral nasal cavity. Somewhat atrophic appearing nasal cavity mucosa. Bilateral middle concha bullosa. Paradoxical rotation of the left middle turbinate. Intact and midline septum. Anatomy: Anterior ethmoidal artery position suspected on coronal images 27 and 28 with adjacent ethmoid and frontal sinus pneumatization. Keros 2/3 olfactory fossa Other: Grossly negative visible noncontrast brain parenchyma. Visualized orbits and scalp soft tissues are within normal limits. Negative visible noncontrast deep soft tissue spaces of the face. Bilateral tympanic cavities and mastoids are clear. Congenital incomplete ossification of the posterior C1 ring. No acute osseous abnormality identified. IMPRESSION: 1. Minimal maxillary sinus mucosal thickening, occasionally with small polypoid areas. Bilateral OMCs remain patent. 2. Other paranasal sinuses are clear. 3. Mild retained secretions in the nasal cavity. Bilateral middle concha bullosa and paradoxical rotation of the left middle turbinate. Electronically Signed   By: Genevie Ann M.D.   On: 04/06/2017 10:23    ASSESSMENT & PLAN:  Holly Mcdonald is a 41 y.o. female who has a history of seasonal allergies, asthma, bronchitis, prediabetic    1. Unprovoked Pulmonary Embolism in 10/2016 -Due to her young age, unprovoked PE, I recommend continue anticoagulation indefinitely, if there is no contraindication.   -OK to holds her Eliquis for 2-3 days of the start of her period as to lessen her menorrhagia. -She had IUD placed, with progesterone only, she knows to avoid oral contraceptives, due to the increased risk of thrombosis. -She will follow-up with her primary care physician  2. Iron deficient anemia -She has been slightly anemic since she started anticoagulation. This is likely secondary to Eliquis induced menorrhagia. -Previous Iron study showed low iron and ferritin, consistent with iron deficiency. -I  suggest she take oral iron, with Valamin C to make sure her levels can become normal.  -I suggest colace as she may get constipated on oral iron -Her 04/04/17 iron study came back improved with Saturation at 14%, overall normal.  -I encouraged her to repeat labs ever 2-3 months  -She will continue oral iron, for at least another 3 months. If her period stops while on Mirena I suggest she can stop oral iron.  -If she proceeds to have heavy periods and her iron levels drop, she can see me for IV iron.  -I suggest she holds her Eliquis for 2-3 days of the start of her period as to lessen her menorrhagia.  -She will conitnue to be followed by Dr. Martinique and follow up with me as needed.    3. Anxiety -Will f/u with Dr. Martinique for any medication or other non medical therapies.   4. Fibromyalgia -she has aches and pains upon touch from chest, shoulder and axillary regions  -Her mother has  history of fibromyalgia  -I suggest she follow up to Dr. Martinique and discuss about this.  -She was diagnosed with fibromyalgia and takes cymbalta for management   5. Dyspnea  -Likely the combination of bilateral PE, obesity, asthma and anxiety -Her pulmonary function test was unremarkable, she saturation very well on room air -I encouraged her to continue physical therapy, and try to lose weight.   Follow up -F/u as needed -Continue Eliquis indefinitely, she will follow-up with her primary care physician -Continue oral iron, unless her menstrual period stops due to the IUD   No problem-specific Assessment & Plan notes found for this encounter.    All questions were answered. The patient knows to call the clinic with any problems, questions or concerns. I spent 40 minutes counseling the patient face to face. The total time spent in the appointment was 45 minutes and more than 50% was on counseling.   This document serves as a record of services personally performed by Truitt Merle, MD. It was created on  her behalf by Joslyn Devon, a trained medical scribe. The creation of this record is based on the scribe's personal observations and the provider's statements to them.    I have reviewed the above documentation for accuracy and completeness, and I agree with the above.    Truitt Merle, MD 04/07/2017  9:43 AM

## 2017-04-06 NOTE — Assessment & Plan Note (Signed)
Follow-up echocardiogram suggested normal LV size and function as well as normal RV size and pressures.

## 2017-04-06 NOTE — Assessment & Plan Note (Signed)
This is contributing to her dyspnea.  I think losing weight would certainly help her costochondritis as well.

## 2017-04-07 ENCOUNTER — Ambulatory Visit (HOSPITAL_BASED_OUTPATIENT_CLINIC_OR_DEPARTMENT_OTHER): Payer: 59 | Admitting: Hematology

## 2017-04-07 ENCOUNTER — Encounter: Payer: Self-pay | Admitting: Hematology

## 2017-04-07 VITALS — BP 112/53 | HR 83 | Temp 98.4°F | Resp 20 | Ht 70.0 in | Wt 336.0 lb

## 2017-04-07 DIAGNOSIS — I2699 Other pulmonary embolism without acute cor pulmonale: Secondary | ICD-10-CM

## 2017-04-07 DIAGNOSIS — M791 Myalgia, unspecified site: Secondary | ICD-10-CM

## 2017-04-07 DIAGNOSIS — Z86718 Personal history of other venous thrombosis and embolism: Secondary | ICD-10-CM

## 2017-04-07 DIAGNOSIS — F419 Anxiety disorder, unspecified: Secondary | ICD-10-CM | POA: Diagnosis not present

## 2017-04-07 DIAGNOSIS — Z7901 Long term (current) use of anticoagulants: Secondary | ICD-10-CM

## 2017-04-07 DIAGNOSIS — R0600 Dyspnea, unspecified: Secondary | ICD-10-CM

## 2017-04-07 DIAGNOSIS — D509 Iron deficiency anemia, unspecified: Secondary | ICD-10-CM

## 2017-04-07 DIAGNOSIS — D500 Iron deficiency anemia secondary to blood loss (chronic): Secondary | ICD-10-CM

## 2017-04-09 ENCOUNTER — Other Ambulatory Visit: Payer: Self-pay | Admitting: Family Medicine

## 2017-04-09 DIAGNOSIS — K219 Gastro-esophageal reflux disease without esophagitis: Secondary | ICD-10-CM

## 2017-04-24 ENCOUNTER — Encounter: Payer: Self-pay | Admitting: Family Medicine

## 2017-04-26 ENCOUNTER — Ambulatory Visit: Payer: 59 | Admitting: Family Medicine

## 2017-05-01 ENCOUNTER — Other Ambulatory Visit: Payer: Self-pay | Admitting: Family Medicine

## 2017-05-01 DIAGNOSIS — I2699 Other pulmonary embolism without acute cor pulmonale: Secondary | ICD-10-CM

## 2017-05-05 ENCOUNTER — Other Ambulatory Visit: Payer: Self-pay | Admitting: Family Medicine

## 2017-05-06 ENCOUNTER — Ambulatory Visit: Payer: 59 | Admitting: Gynecology

## 2017-05-07 ENCOUNTER — Other Ambulatory Visit: Payer: Self-pay | Admitting: Family Medicine

## 2017-05-07 DIAGNOSIS — R519 Headache, unspecified: Secondary | ICD-10-CM

## 2017-05-07 DIAGNOSIS — R51 Headache: Principal | ICD-10-CM

## 2017-05-07 DIAGNOSIS — J309 Allergic rhinitis, unspecified: Secondary | ICD-10-CM

## 2017-05-13 ENCOUNTER — Telehealth: Payer: Self-pay | Admitting: Family Medicine

## 2017-05-13 NOTE — Telephone Encounter (Signed)
I am not sure about what I am being asked. I recently placed 2 referral: Neurologist and Immunologist as she requested.  Thanks, BJ

## 2017-05-13 NOTE — Telephone Encounter (Signed)
Message sent to Dr. Jordan for review. 

## 2017-05-13 NOTE — Telephone Encounter (Signed)
Left message to call clinic back to clarify want she needed because referrals have already been placed.

## 2017-05-13 NOTE — Telephone Encounter (Signed)
Copied from [place]. Topic: Referral - Question >> May 13, 2017  9:16 AM Ether Griffins B wrote: Reason for CRM: pts insurance doesn't have to referrals. She now has [facility] navigate plan and referrals need to be placed with insurance for them to cover. Pt wants to know if Dr. Morrison Old office  does this or the other office shes going to, to insure insurance pays for visits

## 2017-05-17 ENCOUNTER — Telehealth: Payer: Self-pay | Admitting: Family Medicine

## 2017-05-17 ENCOUNTER — Other Ambulatory Visit: Payer: Self-pay | Admitting: *Deleted

## 2017-05-17 DIAGNOSIS — M797 Fibromyalgia: Secondary | ICD-10-CM

## 2017-05-17 NOTE — Telephone Encounter (Signed)
Referral sent 

## 2017-05-17 NOTE — Telephone Encounter (Signed)
Referral to neuro was placed on 12/29/187 as she requested. If another referral is needed it is ok to place again.  Thanks, BJ

## 2017-05-17 NOTE — Telephone Encounter (Signed)
Copied from [place] [phone]. Topic: Referral - Request >> May 17, 2017  9:52 AM Synthia Innocent wrote: Reason for CRM: Calling from Dr Deeann Saint office, patient insurance requires referral. Their office is referring to [facility].

## 2017-05-18 ENCOUNTER — Other Ambulatory Visit: Payer: Self-pay | Admitting: *Deleted

## 2017-05-18 DIAGNOSIS — G44209 Tension-type headache, unspecified, not intractable: Secondary | ICD-10-CM

## 2017-05-19 ENCOUNTER — Ambulatory Visit (INDEPENDENT_AMBULATORY_CARE_PROVIDER_SITE_OTHER): Payer: 59 | Admitting: Gynecology

## 2017-05-19 ENCOUNTER — Telehealth: Payer: Self-pay | Admitting: Family Medicine

## 2017-05-19 ENCOUNTER — Encounter: Payer: Self-pay | Admitting: Gynecology

## 2017-05-19 VITALS — BP 124/80

## 2017-05-19 DIAGNOSIS — Z30431 Encounter for routine checking of intrauterine contraceptive device: Secondary | ICD-10-CM

## 2017-05-19 NOTE — Telephone Encounter (Signed)
Copied from [place] [phone]. Topic: General - Other >> May 19, 2017  2:14 PM Vernona Rieger wrote: Jeannene Patella from [facility] said she says needs the approval to [facility] sent to them Fax number [phone]

## 2017-05-19 NOTE — Progress Notes (Signed)
    Holly Mcdonald [birth year] [id number]        42 y.o.  G0P0000 presents for IUD follow-up exam.  Had Mirena IUD placed 03/2017.  Notes her subsequent menses lighter and she is very pleased with this.  No complaints at this time.  Past medical history,surgical history, problem list, medications, allergies, family history and social history were all reviewed and documented in the EPIC chart.  Directed ROS with pertinent positives and negatives documented in the history of present illness/assessment and plan.  Exam: With [name] assistant Vitals:   05/19/17 0925  BP: 124/80   General appearance:  Normal Abdomen soft nontender without masses guarding rebound Pelvic external BUS vagina normal.  Cervix normal with IUD string visualized.  Uterus difficult to palpate but no gross masses or tenderness.  Adnexa without gross masses or tenderness.  Assessment/Plan:  42 y.o. G0P0000 with normal IUD follow-up exam.  Patient will follow-up when due for her annual exam, sooner if any issues.    Anastasio Auerbach MD, 9:33 AM 05/19/2017

## 2017-05-19 NOTE — Patient Instructions (Signed)
Follow-up when due for annual gynecologic exam

## 2017-05-24 NOTE — Telephone Encounter (Signed)
Copied from [place] [phone]. Topic: Referral - Question >> May 24, 2017  9:39 AM Patrice Paradise wrote: Reason for CRM: Pam from [facility] said she says needs the approval from [employer] sent to them her. Patient has an appt tomorrow and she will not be able to be seen if they do not have the approval first, the appt will have to be reschedule. Pam can be reached @ [phone], fax number [phone].

## 2017-05-25 ENCOUNTER — Telehealth: Payer: Self-pay | Admitting: *Deleted

## 2017-05-25 NOTE — Telephone Encounter (Deleted)
Spoke with Arbie Cookey, gave verbal order.

## 2017-05-25 NOTE — Telephone Encounter (Signed)
New referral for [facility] sent over.

## 2017-05-25 NOTE — Telephone Encounter (Signed)
Referral for [facility] sent by D. Dawkins.

## 2017-05-31 ENCOUNTER — Other Ambulatory Visit: Payer: Self-pay | Admitting: Family Medicine

## 2017-05-31 DIAGNOSIS — I2699 Other pulmonary embolism without acute cor pulmonale: Secondary | ICD-10-CM

## 2017-06-03 ENCOUNTER — Other Ambulatory Visit: Payer: Self-pay | Admitting: *Deleted

## 2017-06-03 ENCOUNTER — Telehealth: Payer: Self-pay | Admitting: *Deleted

## 2017-06-03 DIAGNOSIS — Z0100 Encounter for examination of eyes and vision without abnormal findings: Secondary | ICD-10-CM

## 2017-06-03 DIAGNOSIS — Z86718 Personal history of other venous thrombosis and embolism: Secondary | ICD-10-CM

## 2017-06-03 DIAGNOSIS — I2699 Other pulmonary embolism without acute cor pulmonale: Secondary | ICD-10-CM

## 2017-06-03 DIAGNOSIS — J309 Allergic rhinitis, unspecified: Secondary | ICD-10-CM

## 2017-06-03 DIAGNOSIS — J45909 Unspecified asthma, uncomplicated: Secondary | ICD-10-CM

## 2017-06-03 NOTE — Telephone Encounter (Signed)
Copied from [place]. Topic: Referral - Status >> Jun 02, 2017  8:51 AM Scherrie Gerlach wrote: Reason for CRM: pt states she has [facility] NAVIGATE this year and all her referral need to have authorized. The allergy referral was done last year, before pt's plan changed this year to navigate.  Pt states she faxed a copy of her new card last week, uploaded into the chart 05/19/17  Pt needs referral for Allergy and asthma for appt 06/13/17  Also will need for Dr Alois Cliche with [facility] Dr Elsworth Soho w/ [facility]

## 2017-06-03 NOTE — Telephone Encounter (Signed)
Referrals sent

## 2017-06-03 NOTE — Telephone Encounter (Signed)
It is Ok to place referral as requested by pt. Thanks, BJ

## 2017-06-07 ENCOUNTER — Encounter: Payer: Self-pay | Admitting: Family Medicine

## 2017-06-13 ENCOUNTER — Ambulatory Visit: Payer: 59 | Admitting: Allergy

## 2017-06-13 ENCOUNTER — Encounter: Payer: Self-pay | Admitting: Allergy

## 2017-06-13 VITALS — BP 122/72 | HR 101 | Temp 98.1°F | Resp 18 | Ht 70.0 in | Wt 340.0 lb

## 2017-06-13 DIAGNOSIS — J309 Allergic rhinitis, unspecified: Secondary | ICD-10-CM | POA: Diagnosis not present

## 2017-06-13 DIAGNOSIS — J4530 Mild persistent asthma, uncomplicated: Secondary | ICD-10-CM | POA: Diagnosis not present

## 2017-06-13 DIAGNOSIS — H1010 Acute atopic conjunctivitis, unspecified eye: Secondary | ICD-10-CM

## 2017-06-13 DIAGNOSIS — I2699 Other pulmonary embolism without acute cor pulmonale: Secondary | ICD-10-CM

## 2017-06-13 DIAGNOSIS — G43009 Migraine without aura, not intractable, without status migrainosus: Secondary | ICD-10-CM | POA: Diagnosis not present

## 2017-06-13 MED ORDER — MONTELUKAST SODIUM 10 MG PO TABS: 10.0000 mg | ORAL_TABLET | Freq: Every day | ORAL | 5 refills | Status: DC

## 2017-06-13 NOTE — Progress Notes (Signed)
New Patient Note  RE: Holly Mcdonald MRN: [id number] DOB: 03-03-1976 Date of Office Visit: 06/13/2017  Referring provider: Martinique, Betty G, MD Primary care provider: Martinique, Betty G, MD  Chief Complaint: migraines  History of present illness: Holly Mcdonald is a 42 y.o. female presenting today for consultation for migraines.   She states she has been dealing with migraines "all her life".  She used to take "migraine meds" and she switched over to allergy medications as she was told that migraines can be a common symptom of allergies.  She has seen Dr. Benjamine Mola with ENT for migraines and possible sinus related issues and she had a sinus CT that was unremarkable.  Thus it was concluded that her migraines were not related to sinus disease.   Dr. Benjamine Mola referred her to Dr. Domingo Cocking, a neurologist. She was started on topirimate.   She also states that she does continue to take cetirizine which helps to decrease migraine frequency.  She stopped her cetirizine and her nasal spray for this visit today and she notes that she has had return of migraine as well as nasal congestion and drainage and ear itchiness.  She has nasal drainage with post-nasal drip, throat clearing, sneezing, itchy eyes.  She also uses fluticasone 1 spray each nostril daily.  Symptoms are year-round.  She has also tried Claritin and singulair in the past.  She states she believes she came off the Singulair as she lost coverage with her insurance.  She has also tried allergy eye drop provided by her eye doctor.  She recalls being positive to grass, mold, weeds, dust and dust mites on testing about 15 years ago.   She does report taking self administer allergy injections for about 1-2 years before she stopped.  She has a history of asthma, adult-onset.  She is on Advair now since she had her PE (see below). She states she uses the Advair as needed if she has bronchitis or asthma flares.  She states last year she had 5  bouts of bronchitis.  She usually will get treated with prednisone and antibiotics.  She uses the albuterol only with bronchitis episodes.     Her last course of prednisone was in 03/2017 for asthmatic bronchitis.   She denies any nighttime awakenings.  She has seen Dr. Elsworth Soho with [facility] with last visit on 03/28/17 for follow-up of PE and asthma.  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  Only risk factor was a 4hr car ride.  Her mother had PE at age 84.  Due to PE she has seen hematology and is on life-long anticoagulation.  She also is being referred to rheumatology as she feels there may be an underlying autoimmune issue that led to her being susceptible to PE.  She states she does get shortness of breath with exertion related to still recovering from the PE.  She states this shortness of breath is very different feeling than the shortness of breath she may get with asthma.  No history of eczema or food allergy.    Review of systems: Review of Systems  Constitutional: Negative for chills, fever and malaise/fatigue.  HENT: Positive for congestion. Negative for ear discharge, ear pain, nosebleeds, sinus pain, sore throat and tinnitus.   Eyes: Negative for pain, discharge and redness.  Respiratory: Negative for cough, shortness of breath and wheezing.   Cardiovascular: Negative for chest pain.  Gastrointestinal:  Positive for constipation. Negative for abdominal pain, diarrhea, heartburn, nausea and vomiting.  Musculoskeletal: Negative for joint pain.  Skin: Negative for itching and rash.  Neurological: Positive for headaches. Negative for dizziness.    All other systems negative unless noted above in HPI  Past medical history: Past Medical History:  Diagnosis Date  . Allergy   . Asthma   . Asthma in adult, unspecified asthma severity, uncomplicated 01/12/7095  . Bilateral pulmonary embolism ([place])  11/03/2016  . Morbid obesity with BMI of 45.0-49.9, adult [facility])     Past surgical history: Past Surgical History:  Procedure Laterality Date  . GYNECOLOGIC CRYOSURGERY     Early 67s  . INTRAUTERINE DEVICE INSERTION  03/30/2017   Mirena  . RECTAL EXAMINATION UNDER ANESTHESIA W/ CYSTOSCOPY    . TONSILLECTOMY    . TRANSTHORACIC ECHOCARDIOGRAM  11/04/2016    (In setting of bilateral PE) mild LVH. Systolic function was normal.   EF 55-60%. Mild LA dilation. (poor windows)    Family history:  Family History  Problem Relation Age of Onset  . Pulmonary embolism Mother        Last year  . Mental retardation Mother        PTSD and fibromyalgia  . Heart disease Mother        CHF  . Congestive Heart Failure Mother   . Heart disease Father 57       CAD  . Hyperlipidemia Father   . Diabetes Paternal Grandmother   . Heart attack Paternal Grandmother   . Congestive Heart Failure Maternal Aunt   . Cancer Neg Hx     Social history: She lives in a home with carpeting with electric heating and central cooling.  There are no pets in the home.  There is no concern for water damage, mildew or roaches in the home.  She works as a Insurance claims handler.  She denies a smoking history.   Medication List: Allergies as of 06/13/2017      Reactions   Other Nausea And Vomiting   Darvocet N-100      Medication List        Accurate as of 06/13/17 12:30 PM. Always use your most recent med list.          acetaminophen 650 MG CR tablet Commonly known as:  TYLENOL Take 650 mg by mouth every 8 (eight) hours as needed for pain.   ADVAIR DISKUS 250-50 MCG/DOSE Aepb Generic drug:  Fluticasone-Salmeterol INHALE 1 PUFF INTO THE LUNGS TWICE DAILY   albuterol 108 (90 Base) MCG/ACT inhaler Commonly known as:  PROVENTIL HFA;VENTOLIN HFA Inhale 1-2 puffs into the lungs every 6 (six) hours as needed for wheezing or shortness of breath.   albuterol (2.5 MG/3ML) 0.083% nebulizer solution Commonly known  as:  PROVENTIL Take 3 mLs (2.5 mg total) by nebulization every 6 (six) hours as needed for wheezing or shortness of breath.   calcium carbonate 500 MG chewable tablet Commonly known as:  TUMS - dosed in mg elemental calcium Chew 1 tablet by mouth daily as needed.   cetirizine 10 MG tablet Commonly known as:  ZYRTEC Take 10 mg by mouth daily.   chlorpheniramine-HYDROcodone 10-8 MG/5ML Suer Commonly known as:  TUSSIONEX PENNKINETIC ER Take 5 mLs by mouth every 12 (twelve) hours as needed for cough.   DULoxetine 60 MG capsule Commonly known as:  CYMBALTA Take 1 capsule (60 mg total) by mouth daily.   ELIQUIS 5 MG Tabs tablet Generic drug:  apixaban TAKE 1 TABLET(5 MG) BY MOUTH TWICE DAILY   fluticasone 50 MCG/ACT nasal spray Commonly known as:  FLONASE SHAKE LIQUID AND USE 1 SPRAY IN EACH NOSTRIL DAILY   IRON PO Take by mouth.   LEVAQUIN PO Take by mouth.   methocarbamol 500 MG tablet Commonly known as:  ROBAXIN Take 1 tablet (500 mg total) by mouth every 8 (eight) hours as needed for muscle spasms.   montelukast 10 MG tablet Commonly known as:  SINGULAIR Take 1 tablet (10 mg total) by mouth at bedtime.   omeprazole 40 MG capsule Commonly known as:  PRILOSEC TAKE 1 CAPSULE(40 MG) BY MOUTH DAILY   oxybutynin 5 MG tablet Commonly known as:  DITROPAN Take 1 tablet by mouth 2 (two) times daily.   predniSONE 10 MG tablet Commonly known as:  DELTASONE Take 2 tablets daily for first 5 days, then take 1 tablet daily for 5 days until finished.   TESSALON PO Take by mouth.   timolol 0.5 % ophthalmic solution Commonly known as:  TIMOPTIC Place 1 drop into both eyes every morning.   topiramate 25 MG tablet Commonly known as:  TOPAMAX TK 3 TS PO D   traMADol 50 MG tablet Commonly known as:  ULTRAM Take 50 mg as needed by mouth.   Vitamin D (Ergocalciferol) 50000 units Caps capsule Commonly known as:  DRISDOL 1 cap weekly for 8 weeks then every 2 weeks.        Known medication allergies: Allergies  Allergen Reactions  . Other Nausea And Vomiting    Darvocet N-100    Physical examination: Blood pressure 122/72, pulse (!) 101, temperature 98.1 F (36.7 C), resp. rate 18, height 5\' 10"  (1.778 m), weight (!) 340 lb (154.2 kg), SpO2 96 %.  General: Alert, interactive, in no acute distress, obese.Marland Kitchen HEENT: PERRLA, TMs pearly gray, turbinates mildly edematous with clear discharge, post-pharynx non erythematous. Neck: Supple without lymphadenopathy. Lungs: Clear to auscultation without wheezing, rhonchi or rales. {no increased work of breathing. CV: Normal S1, S2 without murmurs. Abdomen: Nondistended, nontender. Skin: Warm and dry, without lesions or rashes. Extremities:  No clubbing, cyanosis or edema. Neuro:   Grossly intact.  Diagnositics/Labs: Labs:  Component     Latest Ref Rng & Units 03/15/2017  WBC     4.0 - 10.5 K/uL 7.8  RBC     3.87 - 5.11 MIL/uL 4.59  Hemoglobin     12.0 - 15.0 g/dL 13.1  HCT     36.0 - 46.0 % 39.9  MCV     78.0 - 100.0 fL 86.9  MCH     26.0 - 34.0 pg 28.5  MCHC     30.0 - 36.0 g/dL 32.8  RDW     11.5 - 15.5 % 14.6  Platelets     150 - 400 K/uL 262   Component     Latest Ref Rng & Units 03/15/2017  Sodium     135 - 145 mmol/L 138  Potassium     3.5 - 5.1 mmol/L 3.8  Chloride     101 - 111 mmol/L 103  CO2     22 - 32 mmol/L 26  Glucose     65 - 99 mg/dL 110 (H)  BUN     6 - 20 mg/dL 8  Creatinine     0.44 - 1.00 mg/dL 0.68  Calcium     8.9 - 10.3 mg/dL 9.4  GFR, Est Non African American     >  60 mL/min >60  GFR, Est African American     >60 mL/min >60  Anion gap     5 - 15 9   Imaging: Personally reviewed imaging that was all rather unremarkable.  Ct sinus 04/06/17 showing Minimal maxillary sinus mucosal thickening, occasionally with small polypoid areas with patent OMCS.  Mild retained secretions in the nasal cavity. Bilateral middle concha bullosa and paradoxical rotation of the  left middle turbinate. CXR 04/01/17 No active cardiopulmonary disease. CXR 03/15/17 No active cardiopulmonary disease.  Spirometry: FEV1: 2.64L  85%, FVC: 3.1L  82%, ratio consistent with Nonobstructive pattern  Allergy testing:  Environmental allergy skin prick testing is positive to birch, oak. Intradermal testing is positive to major mold mix 1 (Alternaria and Cladosporium), cockroach and mite mix Allergy testing results were read and interpreted by provider, documented by clinical staff.   Assessment and plan:   Migraines with allergic rhinoconjunctivitis    - environmental allergy testing today is positive to tree pollen, molds, dust mites and cockroach.  Allergen avoidance measures discussed and handouts provided    - resume use of Cetirizine 10mg  daily    - start Singulair 10mg  daily at night    - continue Fluticasone 1-2 sprays each nostril daily for nasal congestion    - allergen immunotherapy (allergy shots) discussed today including protocol, benefits and risks.  She would like to continue with medication management at this time.  Asthma, adult-onset    - continue Advair diskus 1 puff twice a day during asthma flares/bronchitis episodes.  Use until symptoms have resolved.  If you start to have symptoms inbetween bronchitis episodes recommend daily use of Advair    - have access to albuterol inhaler 2 puffs every 4-6 hours as needed for cough/wheeze/shortness of breath/chest tightness.  May use 15-20 minutes prior to activity.   Monitor frequency of use.    Asthma control goals:   Full participation in all desired activities (may need albuterol before activity)  Albuterol use two time or less a week on average (not counting use with activity)  Cough interfering with sleep two time or less a month  Oral steroids no more than once a year  No hospitalizations  History of PE   - continue anticoagulation as per hematology  Follow-up 3 months  I appreciate the  opportunity to take part in Holly's care. Please do not hesitate to contact me with questions.  Sincerely,   Prudy Feeler, MD Allergy/Immunology [facility]

## 2017-06-13 NOTE — Patient Instructions (Addendum)
Migraines with allergic rhinoconjunctivitis    - environmental allergy testing today is positive to tree pollen, molds, dust mites and cockroach.  Allergen avoidance measures discussed and handouts provided    - resume use of Cetirizine 10mg  daily    - start Singulair 10mg  daily at night    - continue Fluticasone 1-2 sprays each nostril daily for nasal congestion    - allergen immunotherapy (allergy shots) discussed today including protocol, benefits and risks.     Asthma, adult-onset    - continue Advair diskus 1 puff twice a day during asthma flares/bronchitis episodes.  Use until symptoms have resolved.  If you start to have symptoms inbetween bronchitis episodes recommend daily use of Advair    - have access to albuterol inhaler 2 puffs every 4-6 hours as needed for cough/wheeze/shortness of breath/chest tightness.  May use 15-20 minutes prior to activity.   Monitor frequency of use.    Asthma control goals:   Full participation in all desired activities (may need albuterol before activity)  Albuterol use two time or less a week on average (not counting use with activity)  Cough interfering with sleep two time or less a month  Oral steroids no more than once a year  No hospitalizations  History of PE   - continue anticoagulation as per hematology  Follow-up 3 months

## 2017-06-14 ENCOUNTER — Other Ambulatory Visit: Payer: Self-pay | Admitting: Family Medicine

## 2017-06-14 DIAGNOSIS — M797 Fibromyalgia: Secondary | ICD-10-CM

## 2017-06-18 ENCOUNTER — Other Ambulatory Visit: Payer: Self-pay | Admitting: Family Medicine

## 2017-06-18 DIAGNOSIS — K219 Gastro-esophageal reflux disease without esophagitis: Secondary | ICD-10-CM

## 2017-06-27 ENCOUNTER — Other Ambulatory Visit: Payer: Self-pay | Admitting: Family Medicine

## 2017-06-27 DIAGNOSIS — I2699 Other pulmonary embolism without acute cor pulmonale: Secondary | ICD-10-CM

## 2017-06-30 DIAGNOSIS — H3563 Retinal hemorrhage, bilateral: Secondary | ICD-10-CM | POA: Insufficient documentation

## 2017-07-06 ENCOUNTER — Encounter: Payer: Self-pay | Admitting: Family Medicine

## 2017-07-06 ENCOUNTER — Ambulatory Visit: Payer: 59 | Admitting: Family Medicine

## 2017-07-06 VITALS — BP 124/80 | HR 90 | Temp 98.7°F | Resp 12 | Ht 70.0 in | Wt 333.2 lb

## 2017-07-06 DIAGNOSIS — R7303 Prediabetes: Secondary | ICD-10-CM | POA: Diagnosis not present

## 2017-07-06 DIAGNOSIS — M2550 Pain in unspecified joint: Secondary | ICD-10-CM | POA: Diagnosis not present

## 2017-07-06 DIAGNOSIS — F419 Anxiety disorder, unspecified: Secondary | ICD-10-CM | POA: Diagnosis not present

## 2017-07-06 DIAGNOSIS — M797 Fibromyalgia: Secondary | ICD-10-CM

## 2017-07-06 DIAGNOSIS — I2699 Other pulmonary embolism without acute cor pulmonale: Secondary | ICD-10-CM | POA: Diagnosis not present

## 2017-07-06 LAB — HEMOGLOBIN A1C: Hgb A1c MFr Bld: 6 % (ref 4.6–6.5)

## 2017-07-06 LAB — SEDIMENTATION RATE: Sed Rate: 24 mm/hr — ABNORMAL HIGH (ref 0–20)

## 2017-07-06 NOTE — Patient Instructions (Signed)
A few things to remember from today's visit:   Polyarthralgia - Plan: ANA,IFA RA Diag Pnl w/rflx Tit/Patn, Sedimentation rate  Fibromyalgia muscle pain  Bilateral pulmonary embolism (HCC)  Anxiety disorder, unspecified type  Pre-diabetes - Plan: Hemoglobin A1c   Please be sure medication list is accurate. If a new problem present, please set up appointment sooner than planned today.

## 2017-07-06 NOTE — Assessment & Plan Note (Signed)
We discussed diagnostic criteria. She will continue Cymbalta 60 mg daily. Regular low impact exercise and good sleep hygiene. Follow-up in 3-4 months.

## 2017-07-06 NOTE — Progress Notes (Signed)
HPI:  Chief Complaint  Patient presents with  . Discuss lab work    wants ANA done and Iron levels checked, wants bloodwork to check for Lupus    [name] is a 42 y.o. female, who is here today requesting lab work done.  She recently requested referral to rheuma but she was not accepted as a pt because she has no clear indication. 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 She did some resource on line and wants to have ANA done. ESR has been elevated before.  According to patient, she goggle all her symptoms and they can be explained by lupus. Her mother has Hx of mixed connective tissue disease.  Headache,myalgias,arthralgias,and fatigue among some. No oral lesions,skin rash, or hair loss.  She has requested referral to other providers and currently she follows with: She follows with gyn, Dr Phineas Real. Pulmonologist, Dr Elsworth Soho, for asthma. Neurologists for migraines. Immunologist,Dr Nelva Bush, for allergic rhinitis.   She followed with hematologist, Dr Burr Medico, for PE Cardiologist, Dr Ellyn Hack, who she is supposed to see prn for chest pain and dyspnea.  She also reports recent ophthalmologic evaluation because she was having some blurry vision.  According to patient, she has bilateral Retinal hemorrhage. She states that possible etiology was DM but she has no Hx of DM. she was not instructed to discontinue Eliquis.   Lab Results  Component Value Date   CRP 0.5 12/21/2016   Lab Results  Component Value Date   ESRSEDRATE 48 (H) 12/21/2016   Menstrual flow has improved since she has IUD placed, still having monthly menstrual periods.  Lab Results  Component Value Date   WBC 7.8 03/15/2017   HGB 13.1 03/15/2017   HCT 39.9 03/15/2017   MCV 86.9 03/15/2017   PLT 262 03/15/2017     Fibromyalgia: Cymbalta is helping with myalgias and aching, keeps pain "minimum."  She has pain in  elbow,back,IP, and hips. No erythema or edema. Arthritis gloves help some.  Knees pain "not so much"   Cymbalta has also helped with anxiety, she states that her anxiety is "ok." She does not want to elaborate in this regard. She denies depressed mood or suicidal thoughts.  She also mentions that "producing a lot of mucus", Hx of allergic rhinitis; reporting symptoms as well controlled.   She sleeps "good", she states that this is because "I am medicated." Currently she is on Topamax, which was started recently to treat migraines.  She is not aware of sleep apnea.  She exercises when she can due to pain. She is trying to eat healthy.     Review of Systems  Constitutional: Positive for fatigue. Negative for activity change, appetite change and fever.  HENT: Positive for postnasal drip. Negative for mouth sores, nosebleeds, sore throat and trouble swallowing.   Eyes: Negative for redness and visual disturbance.  Respiratory: Positive for shortness of breath. Negative for cough and wheezing.   Cardiovascular: Positive for chest pain and leg swelling. Negative for palpitations.  Gastrointestinal: Negative for abdominal pain, blood in stool, nausea and vomiting.       Negative for changes in bowel habits.  Endocrine: Negative for cold intolerance, heat intolerance, polydipsia, polyphagia and polyuria.  Genitourinary: Negative for decreased urine volume, dysuria and hematuria.  Musculoskeletal: Positive for arthralgias, back pain and myalgias. Negative for gait problem and joint swelling.  Skin: Negative for rash  and wound.  Allergic/Immunologic: Positive for environmental allergies.  Neurological: Positive for tremors and headaches. Negative for syncope and weakness.  Hematological: Negative for adenopathy. Does not bruise/bleed easily.  Psychiatric/Behavioral: Positive for sleep disturbance. Negative for confusion and suicidal ideas. The patient is nervous/anxious.       Current  Outpatient Medications on File Prior to Visit  Medication Sig Dispense Refill  . acetaminophen (TYLENOL) 650 MG CR tablet Take 650 mg by mouth every 8 (eight) hours as needed for pain.    Marland Kitchen ADVAIR DISKUS 250-50 MCG/DOSE AEPB INHALE 1 PUFF INTO THE LUNGS TWICE DAILY 60 each 6  . albuterol (PROVENTIL HFA;VENTOLIN HFA) 108 (90 Base) MCG/ACT inhaler Inhale 1-2 puffs into the lungs every 6 (six) hours as needed for wheezing or shortness of breath. 18 g 2  . baclofen (LIORESAL) 10 MG tablet   0  . Benzonatate (TESSALON PO) Take by mouth.    . calcium carbonate (TUMS - DOSED IN MG ELEMENTAL CALCIUM) 500 MG chewable tablet Chew 1 tablet by mouth daily as needed.     . cetirizine (ZYRTEC) 10 MG tablet Take 10 mg by mouth daily.    . chlorpheniramine-HYDROcodone (TUSSIONEX PENNKINETIC ER) 10-8 MG/5ML SUER Take 5 mLs by mouth every 12 (twelve) hours as needed for cough. 25 mL 0  . DULoxetine (CYMBALTA) 60 MG capsule Take 1 capsule (60 mg total) by mouth daily. 90 capsule 1  . ELIQUIS 5 MG TABS tablet TAKE 1 TABLET(5 MG) BY MOUTH TWICE DAILY 60 tablet 0  . fluticasone (FLONASE) 50 MCG/ACT nasal spray SHAKE LIQUID AND USE 1 SPRAY IN EACH NOSTRIL DAILY 16 g 2  . IRON PO Take by mouth.    . LevoFLOXacin (LEVAQUIN PO) Take by mouth.    . montelukast (SINGULAIR) 10 MG tablet Take 1 tablet (10 mg total) by mouth at bedtime. 30 tablet 5  . omeprazole (PRILOSEC) 40 MG capsule TAKE 1 CAPSULE(40 MG) BY MOUTH DAILY 30 capsule 0  . oxybutynin (DITROPAN) 5 MG tablet Take 1 tablet by mouth 2 (two) times daily.  11  . predniSONE (DELTASONE) 10 MG tablet Take 2 tablets daily for first 5 days, then take 1 tablet daily for 5 days until finished. 15 tablet 0  . timolol (TIMOPTIC) 0.5 % ophthalmic solution Place 1 drop into both eyes every morning.    . topiramate (TOPAMAX) 25 MG tablet TK 3 TS PO D  1  . traMADol (ULTRAM) 50 MG tablet Take 50 mg as needed by mouth.  0  . Vitamin D, Ergocalciferol, (DRISDOL) 50000 units CAPS  capsule 1 cap weekly for 8 weeks then every 2 weeks. 12 capsule 2  . albuterol (PROVENTIL) (2.5 MG/3ML) 0.083% nebulizer solution Take 3 mLs (2.5 mg total) by nebulization every 6 (six) hours as needed for wheezing or shortness of breath. (Patient not taking: Reported on 07/06/2017) 360 mL 5  . methocarbamol (ROBAXIN) 500 MG tablet Take 1 tablet (500 mg total) by mouth every 8 (eight) hours as needed for muscle spasms. (Patient not taking: Reported on 07/06/2017) 45 tablet 1   No current facility-administered medications on file prior to visit.      Past Medical History:  Diagnosis Date  . Allergy   . Asthma   . Asthma in adult, unspecified asthma severity, uncomplicated 09/15/5275  . Bilateral pulmonary embolism ([place]) 11/03/2016  . Morbid obesity with BMI of 45.0-49.9, adult (HCC)    Allergies  Allergen Reactions  . Other Nausea And Vomiting  Darvocet N-100    Social History   Socioeconomic History  . Marital status: Divorced    Spouse name: None  . Number of children: None  . Years of education: None  . Highest education level: None  Social Needs  . Financial resource strain: None  . Food insecurity - worry: None  . Food insecurity - inability: None  . Transportation needs - medical: None  . Transportation needs - non-medical: None  Occupational History  . None  Tobacco Use  . Smoking status: Never Smoker  . Smokeless tobacco: Never Used  Substance and Sexual Activity  . Alcohol use: Yes    Comment: Occas  . Drug use: No  . Sexual activity: Yes    Birth control/protection: IUD    Comment: 1st intercourse 42 yo-More than 5 partners  Mirena 03/30/2017  Other Topics Concern  . None  Social History Narrative  . None    Vitals:   07/06/17 0951  BP: 124/80  Pulse: 90  Resp: 12  Temp: 98.7 F (37.1 C)  SpO2: 99%   Body mass index is 47.82 kg/m.    Physical Exam  Nursing note and vitals reviewed. Constitutional: She is oriented to person, place, and time.  She appears well-developed. She does not appear ill. No distress.  HENT:  Head: Normocephalic and atraumatic.  Eyes: Conjunctivae and EOM are normal. Pupils are equal, round, and reactive to light.  Respiratory: Effort normal and breath sounds normal. No respiratory distress.  GI: Soft. She exhibits no mass. There is no hepatomegaly. There is no tenderness.  Musculoskeletal: She exhibits edema (Trace pitting LE edema, bilateral) and tenderness.       Thoracic back: She exhibits tenderness. She exhibits no bony tenderness.       Lumbar back: She exhibits tenderness.  Positive trigger points on back, chest wall, and extremities bilateral. Tenderness upon palpation of joints, no signs of synovitis. No significant limitation of ROM.   Lymphadenopathy:    She has no cervical adenopathy.  Neurological: She is alert and oriented to person, place, and time. She has normal strength. She displays no tremor. Gait normal.  SLR negative bilateral.  Skin: Skin is warm. No rash noted. No erythema.  Psychiatric: Her mood appears anxious.  Well groomed, good eye contact.     ASSESSMENT AND PLAN:   Ms. Jahnessa was seen today for discuss lab work.  Orders Placed This Encounter  Procedures  . ANA,IFA RA Diag Pnl w/rflx Tit/Patn  . Sedimentation rate  . Hemoglobin A1c     Polyarthralgia  No signs of synovitis. Further recommendations will be given according to lab results.  Anxiety disorder, unspecified type  Reported as improved and stable. No changes in Cymbalta.  Pre-diabetes  Primary prevention through a healthy life style. Further recommendations will be given according to HgA1C results.   -     Hemoglobin A1c    Bilateral pulmonary embolism (HCC) According to patient, she is not supposed to continue following with Dr. Burr Medico. We discussed side effects of Eliquis. I expressed concern about recent findings on eye exam, per patient report "retinal hemorrhage." No changes  for now. She understands the risk of taking medication. Instructed about warning signs.  Fibromyalgia muscle pain We discussed diagnostic criteria. She will continue Cymbalta 60 mg daily. Regular low impact exercise and good sleep hygiene. Follow-up in 3-4 months.      Return in about 4 months (around 11/03/2017).     -[name] was advised  to seek immediate medical attention if sudden worsening symptoms.      Zayveon Raschke G. Martinique, MD  [facility]. [place] office.

## 2017-07-06 NOTE — Assessment & Plan Note (Signed)
According to patient, she is not supposed to continue following with Dr. Burr Medico. We discussed side effects of Eliquis. I expressed concern about recent findings on eye exam, per patient report "retinal hemorrhage." No changes for now. She understands the risk of taking medication. Instructed about warning signs.

## 2017-07-07 ENCOUNTER — Encounter: Payer: Self-pay | Admitting: Family Medicine

## 2017-07-07 LAB — ANA,IFA RA DIAG PNL W/RFLX TIT/PATN
Anti Nuclear Antibody(ANA): NEGATIVE
Rhuematoid fact SerPl-aCnc: <14 IU/mL (ref <14)

## 2017-07-08 ENCOUNTER — Encounter: Payer: Self-pay | Admitting: Hematology

## 2017-07-08 DIAGNOSIS — H3560 Retinal hemorrhage, unspecified eye: Secondary | ICD-10-CM

## 2017-07-08 HISTORY — DX: Retinal hemorrhage, unspecified eye: H35.60

## 2017-07-17 ENCOUNTER — Other Ambulatory Visit: Payer: Self-pay | Admitting: Family Medicine

## 2017-07-17 DIAGNOSIS — K219 Gastro-esophageal reflux disease without esophagitis: Secondary | ICD-10-CM

## 2017-07-17 DIAGNOSIS — F419 Anxiety disorder, unspecified: Secondary | ICD-10-CM

## 2017-07-17 DIAGNOSIS — M797 Fibromyalgia: Secondary | ICD-10-CM

## 2017-07-25 ENCOUNTER — Other Ambulatory Visit: Payer: Self-pay | Admitting: Family Medicine

## 2017-07-25 DIAGNOSIS — I2699 Other pulmonary embolism without acute cor pulmonale: Secondary | ICD-10-CM

## 2017-07-27 DIAGNOSIS — H4010X1 Unspecified open-angle glaucoma, mild stage: Secondary | ICD-10-CM | POA: Insufficient documentation

## 2017-08-01 ENCOUNTER — Encounter: Payer: Self-pay | Admitting: Family Medicine

## 2017-08-02 NOTE — Progress Notes (Signed)
ACUTE VISIT   HPI:  Chief Complaint  Patient presents with  . Bloodwork    check for possible iron and vitamin deficiencies    [name] is a 42 y.o. female, who is here today requesting "vitamin check." She is concerned about a vitamin deficiency causing some of her symptoms. She also would like K+ check.  She is complaining of hand shaking, right leg shaking, hand numbness and tingling, feet tingling, and prolonged menstrual flow among some.  She has had bilateral hand tremor for a while but she feels like it is getting worse since she was diagnosed with PE. A few days ago while she was walking she started with sudden onset of RLE "uncontrolled shaking" that lasted about 30 seconds.  She has not identified exacerbating or alleviating factors. Tingling and numbness of hands have been intermittently for several months.   In 05/2017 she started with feet tingling.  She has history of iron deficiency anemia, currently she is on iron supplementation. She follows with gynecologist, Dr. Phineas Mcdonald.  She recently had a IUD placed (03/2017).  She is still having menstrual flow the last 10-15 days,  She has been bleeding for about 10 days now.    Lab Results  Component Value Date   WBC 7.8 03/15/2017   HGB 13.1 03/15/2017   HCT 39.9 03/15/2017   MCV 86.9 03/15/2017   PLT 262 03/15/2017   03/2017 iron 48 (32), ferritin normal at 28.  Lab Results  Component Value Date   ESRSEDRATE 24 (H) 07/06/2017   Lab Results  Component Value Date   CRP 0.5 12/21/2016    She has history of fibromyalgia, currently she is on Cymbalta 60 mg daily. Polyarthralgias, recent workup was negative for inflammatory arthritis/autoimmune disorder.  Vitamin D deficiency, she is currently on ergocalciferol 50,000 units 2 times per week.  She was supposed to complete 8 weeks of  weekly vitamin D and then continue every 2 weeks.  She tells me that she did not follow my  recommendations because her former PCP prescribed with 2 times per week.  She adds that she has not been consistent with taking Ergocalciferol.  Hx of PE, I saw her for hospital follow-up in 11/2016.  First episode of PE, she requested referral to hematology because she did not feel like she should stop Eliquis after 3-4 months of treatment.  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 According to patient she was released by hematology but she is not sure for how long she needs to continue Eliquis: "1-2 years after I lost some weight" ? Lifetime. Last time she follow with hematologist was before she was diagnosed with retinal hemorrhage. She denies nose bleeding, gum bleeding, gross hematuria, or blood in the stool.   She follows with other providers for some of her chronic medical problems. Asthma, she follows with Dr. Elsworth Mcdonald. Allergic rhinitis, she has seen pulmonologist and ENT.  She also saw neurologist, requested referral because frontal headache.  According to patient, she mentioned tingling and checking sensation but "headache specialist" just treats headaches and did not make recommendations for her other complaints. She is currently on Topamax.  Recently she requested referral to rheumatologist, which was not accepted by a specialist because no clear indication.  She is convinced she had "autoimmune disorder" but blood workup so far has been otherwise normal.  Review of Systems  Constitutional: Positive for fatigue. Negative for  activity change, appetite change and fever.  HENT: Negative for mouth sores, nosebleeds and trouble swallowing.   Eyes: Positive for visual disturbance. Negative for photophobia, pain and redness.  Respiratory: Negative for cough, shortness of breath and wheezing.   Cardiovascular: Negative for chest pain, palpitations and leg swelling.  Gastrointestinal: Negative for abdominal pain, nausea and vomiting.         Negative for changes in bowel habits.  Endocrine: Negative for cold intolerance and heat intolerance.  Genitourinary: Positive for menstrual problem and vaginal bleeding. Negative for decreased urine volume, dysuria and hematuria.  Musculoskeletal: Positive for arthralgias, back pain and myalgias.  Skin: Negative for rash and wound.  Allergic/Immunologic: Positive for environmental allergies.  Neurological: Positive for tremors, numbness and headaches. Negative for syncope and weakness.  Psychiatric/Behavioral: Negative for confusion. The patient is nervous/anxious.     Current Outpatient Medications on File Prior to Visit  Medication Sig Dispense Refill  . acetaminophen (TYLENOL) 650 MG CR tablet Take 650 mg by mouth every 8 (eight) hours as needed for pain.    Marland Kitchen ADVAIR DISKUS 250-50 MCG/DOSE AEPB INHALE 1 PUFF INTO THE LUNGS TWICE DAILY 60 each 6  . albuterol (PROVENTIL HFA;VENTOLIN HFA) 108 (90 Base) MCG/ACT inhaler Inhale 1-2 puffs into the lungs every 6 (six) hours as needed for wheezing or shortness of breath. 18 g 2  . baclofen (LIORESAL) 10 MG tablet   0  . Benzonatate (TESSALON PO) Take by mouth.    . calcium carbonate (TUMS - DOSED IN MG ELEMENTAL CALCIUM) 500 MG chewable tablet Chew 1 tablet by mouth daily as needed.     . cetirizine (ZYRTEC) 10 MG tablet Take 10 mg by mouth daily.    . chlorpheniramine-HYDROcodone (TUSSIONEX PENNKINETIC ER) 10-8 MG/5ML SUER Take 5 mLs by mouth every 12 (twelve) hours as needed for cough. 25 mL 0  . DULoxetine (CYMBALTA) 60 MG capsule TAKE 1 CAPSULE BY MOUTH DAILY 90 capsule 1  . ELIQUIS 5 MG TABS tablet TAKE 1 TABLET(5 MG) BY MOUTH TWICE DAILY 60 tablet 2  . fluticasone (FLONASE) 50 MCG/ACT nasal spray SHAKE LIQUID AND USE 1 SPRAY IN EACH NOSTRIL DAILY 16 g 2  . IRON PO Take by mouth.    . LevoFLOXacin (LEVAQUIN PO) Take by mouth.    . montelukast (SINGULAIR) 10 MG tablet Take 1 tablet (10 mg total) by mouth at bedtime. 30 tablet 5  .  omeprazole (PRILOSEC) 40 MG capsule TAKE 1 CAPSULE(40 MG) BY MOUTH DAILY 30 capsule 0  . oxybutynin (DITROPAN) 5 MG tablet Take 1 tablet by mouth 2 (two) times daily.  11  . predniSONE (DELTASONE) 10 MG tablet Take 2 tablets daily for first 5 days, then take 1 tablet daily for 5 days until finished. 15 tablet 0  . timolol (TIMOPTIC) 0.5 % ophthalmic solution Place 1 drop into both eyes every morning.    . topiramate (TOPAMAX) 25 MG tablet TK 3 TS PO D  1  . traMADol (ULTRAM) 50 MG tablet Take 50 mg as needed by mouth.  0  . Vitamin D, Ergocalciferol, (DRISDOL) 50000 units CAPS capsule 1 cap weekly for 8 weeks then every 2 weeks. 12 capsule 2  . albuterol (PROVENTIL) (2.5 MG/3ML) 0.083% nebulizer solution Take 3 mLs (2.5 mg total) by nebulization every 6 (six) hours as needed for wheezing or shortness of breath. (Patient not taking: Reported on 07/06/2017) 360 mL 5   No current facility-administered medications on file prior to visit.  Past Medical History:  Diagnosis Date  . Allergy   . Asthma   . Asthma in adult, unspecified asthma severity, uncomplicated 0/07/5007  . Bilateral pulmonary embolism ([place]) 11/03/2016  . Morbid obesity with BMI of 45.0-49.9, adult ([facility]CC)    Allergies  Allergen Reactions  . Other Nausea And Vomiting    Darvocet N-100    Social History   Socioeconomic History  . Marital status: Divorced    Spouse name: Not on file  . Number of children: Not on file  . Years of education: Not on file  . Highest education level: Not on file  Occupational History  . Not on file  Social Needs  . Financial resource strain: Not on file  . Food insecurity:    Worry: Not on file    Inability: Not on file  . Transportation needs:    Medical: Not on file    Non-medical: Not on file  Tobacco Use  . Smoking status: Never Smoker  . Smokeless tobacco: Never Used  Substance and Sexual Activity  . Alcohol use: Yes    Comment: Occas  . Drug use: No  . Sexual activity:  Yes    Birth control/protection: IUD    Comment: 1st intercourse 42 yo-More than 5 partners  Mirena 03/30/2017  Lifestyle  . Physical activity:    Days per week: Not on file    Minutes per session: Not on file  . Stress: Not on file  Relationships  . Social connections:    Talks on phone: Not on file    Gets together: Not on file    Attends religious service: Not on file    Active member of club or organization: Not on file    Attends meetings of clubs or organizations: Not on file    Relationship status: Not on file  Other Topics Concern  . Not on file  Social History Narrative  . Not on file    Vitals:   08/03/17 0952  BP: 124/86  Pulse: 100  Resp: 12  Temp: 98 F (36.7 C)  SpO2: 96%   Body mass index is 46.85 kg/m.   Physical Exam  Nursing note and vitals reviewed. Constitutional: She is oriented to person, place, and time. She appears well-developed. No distress.  HENT:  Head: Normocephalic and atraumatic.  Mouth/Throat: Oropharynx is clear and moist and mucous membranes are normal.  Eyes: Pupils are equal, round, and reactive to light. Conjunctivae and EOM are normal.  Neck: No tracheal deviation present. No thyroid mass and no thyromegaly present.  Cardiovascular: Normal rate and regular rhythm.  No murmur heard. Pulses:      Dorsalis pedis pulses are 2+ on the right side, and 2+ on the left side.  Respiratory: Effort normal and breath sounds normal. No respiratory distress.  GI: Soft. She exhibits no mass. There is no hepatomegaly. There is no tenderness.  Musculoskeletal: She exhibits no edema.       Cervical back: She exhibits tenderness. She exhibits no bony tenderness.       Thoracic back: She exhibits tenderness. She exhibits no bony tenderness.       Lumbar back: She exhibits tenderness. She exhibits no bony tenderness.  Tingling and Phalen negative.  Lymphadenopathy:    She has no cervical adenopathy.  Neurological: She is alert and oriented to  person, place, and time. She has normal strength. She displays tremor. No cranial nerve deficit. Coordination and gait normal.  Very mild head tremor upon injection,  not present at rest. Pronator drift negative.  Skin: Skin is warm. No rash noted. No erythema.  Psychiatric: Her mood appears anxious.  Well groomed, good eye contact.      ASSESSMENT AND PLAN:   Ms.Holly was seen today for bloodwork.  Orders Placed This Encounter  Procedures  . CBC with Differential/Platelet  . Ferritin  . Iron  . Vitamin B12  . VITAMIN D 25 Hydroxy (Vit-D Deficiency, Fractures)  . TSH  . Ambulatory referral to Hematology   Lab Results  Component Value Date   TSH 1.60 08/03/2017   Lab Results  Component Value Date   VITAMINB12 214 08/03/2017   Lab Results  Component Value Date   WBC 7.1 08/03/2017   HGB 14.7 08/03/2017   HCT 44.1 08/03/2017   MCV 89.0 08/03/2017   PLT 274.0 08/03/2017     Tremor, unspecified  ? Essential tremor, anxiety,medication side effects among some to consider.  -     TSH  Numbness and tingling  Possible causes discussed, some of her chronic medical problems could cause/aggravate problem as well as some medications, Topamax mainly. Instructed about warning signs. Further recommendations will be given according to lab results.  -     Vitamin B12 -     TSH   Fibromyalgia muscle pain This problem could be causing some of  symptoms she is reporting today. No changes in Cymbalta 60 mg daily. Low impact physical activity.  Iron deficiency anemia due to chronic blood loss No changes in current management. Further recommendation will be given according to lab results.  Personal history of venous thrombosis and embolism She has been on Eliquis since 11/2016.  We discussed some side effects. Explained that I do not feel comfortable managing Eliquis given the fact she has a retinal hemorrhage on DUB. I recommend following with Dr. Burr Medico to clarify  the length of Eliquis treatment. Instructed about warning signs..  Vitamin D deficiency, unspecified No changes in current management for now. Further recommendations will be given according to lab results. I instructed patient that is important to follow treatment recommendations, we discussed some side effects of vitamin D supplementation.  DUB (dysfunctional uterine bleeding) Still not well controlled despite of IUD placement.   I recommend arrange an appointment with Dr. Phineas Mcdonald. Instructed about warning signs.      No follow-ups on file.     -[name] was advised to seek immediate medical attention if sudden worsening symptoms or to follow if they persist or if new concerns arise.       Alyia Lacerte G. [place], MD  [facility]. [place] office.

## 2017-08-03 ENCOUNTER — Encounter: Payer: Self-pay | Admitting: Family Medicine

## 2017-08-03 ENCOUNTER — Ambulatory Visit (INDEPENDENT_AMBULATORY_CARE_PROVIDER_SITE_OTHER): Payer: 59 | Admitting: Family Medicine

## 2017-08-03 VITALS — BP 124/86 | HR 100 | Temp 98.0°F | Resp 12 | Ht 70.0 in | Wt 326.5 lb

## 2017-08-03 DIAGNOSIS — R202 Paresthesia of skin: Secondary | ICD-10-CM

## 2017-08-03 DIAGNOSIS — R251 Tremor, unspecified: Secondary | ICD-10-CM

## 2017-08-03 DIAGNOSIS — E559 Vitamin D deficiency, unspecified: Secondary | ICD-10-CM | POA: Diagnosis not present

## 2017-08-03 DIAGNOSIS — D500 Iron deficiency anemia secondary to blood loss (chronic): Secondary | ICD-10-CM

## 2017-08-03 DIAGNOSIS — M797 Fibromyalgia: Secondary | ICD-10-CM

## 2017-08-03 DIAGNOSIS — R200 Anesthesia of skin: Secondary | ICD-10-CM | POA: Diagnosis not present

## 2017-08-03 DIAGNOSIS — N938 Other specified abnormal uterine and vaginal bleeding: Secondary | ICD-10-CM | POA: Insufficient documentation

## 2017-08-03 DIAGNOSIS — Z86718 Personal history of other venous thrombosis and embolism: Secondary | ICD-10-CM

## 2017-08-03 LAB — CBC WITH DIFFERENTIAL/PLATELET
BASOS PCT: 0.8 % (ref 0.0–3.0)
Basophils Absolute: 0.1 10*3/uL (ref 0.0–0.1)
EOS ABS: 0 10*3/uL (ref 0.0–0.7)
Eosinophils Relative: 0.3 % (ref 0.0–5.0)
HEMATOCRIT: 44.1 % (ref 36.0–46.0)
Hemoglobin: 14.7 g/dL (ref 12.0–15.0)
LYMPHS PCT: 29.3 % (ref 12.0–46.0)
Lymphs Abs: 2.1 10*3/uL (ref 0.7–4.0)
MCHC: 33.4 g/dL (ref 30.0–36.0)
MCV: 89 fl (ref 78.0–100.0)
Monocytes Absolute: 0.4 10*3/uL (ref 0.1–1.0)
Monocytes Relative: 5.6 % (ref 3.0–12.0)
NEUTROS ABS: 4.6 10*3/uL (ref 1.4–7.7)
Neutrophils Relative %: 64 % (ref 43.0–77.0)
PLATELETS: 274 10*3/uL (ref 150.0–400.0)
RBC: 4.95 Mil/uL (ref 3.87–5.11)
RDW: 14.4 % (ref 11.5–15.5)
WBC: 7.1 10*3/uL (ref 4.0–10.5)

## 2017-08-03 LAB — TSH: TSH: 1.6 u[IU]/mL (ref 0.35–4.50)

## 2017-08-03 LAB — VITAMIN B12: Vitamin B-12: 214 pg/mL (ref 211–911)

## 2017-08-03 LAB — VITAMIN D 25 HYDROXY (VIT D DEFICIENCY, FRACTURES): VITD: 27.32 ng/mL — ABNORMAL LOW (ref 30.00–100.00)

## 2017-08-03 LAB — IRON: IRON: 92 ug/dL (ref 42–145)

## 2017-08-03 LAB — FERRITIN: Ferritin: 36.5 ng/mL (ref 10.0–291.0)

## 2017-08-03 NOTE — Assessment & Plan Note (Signed)
No changes in current management. Further recommendation will be given according to lab results.

## 2017-08-03 NOTE — Assessment & Plan Note (Signed)
This problem could be causing some of  symptoms she is reporting today. No changes in Cymbalta 60 mg daily. Low impact physical activity.

## 2017-08-03 NOTE — Assessment & Plan Note (Signed)
Still not well controlled despite of IUD placement.   I recommend arrange an appointment with Dr. Phineas Real. Instructed about warning signs.

## 2017-08-03 NOTE — Patient Instructions (Signed)
A few things to remember from today's visit:   Tremor, unspecified - Plan: TSH  Numbness and tingling - Plan: Vitamin B12, TSH  Fibromyalgia muscle pain  Vitamin D deficiency, unspecified - Plan: VITAMIN D 25 Hydroxy (Vit-D Deficiency, Fractures)  Iron deficiency anemia due to chronic blood loss - Plan: CBC with Differential/Platelet, Ferritin, Iron  Tremor A tremor is trembling or shaking that you cannot control. Most tremors affect the hands or arms. Tremors can also affect the head, vocal cords, face, and other parts of the body. There are many types of tremors. Common types include:  Essential tremor. These usually occur in people over the age of 68. It may run in families and can happen in otherwise healthy people.  Resting tremor. These occur when the muscles are at rest, such as when your hands are resting in your lap. People with Parkinson disease often have resting tremors.  Postural tremor. These occur when you try to hold a pose, such as keeping your hands outstretched.  Kinetic tremor. These occur during purposeful movement, such as trying to touch a finger to your nose.  Task-specific tremor. These may occur when you perform tasks such as handwriting, speaking, or standing.  Psychogenic tremor. These dramatically lessen or disappear when you are distracted. They can happen in people of all ages.  Some types of tremors have no known cause. Tremors can also be a symptom of nervous system problems (neurological disorders) that may occur with aging. Some tremors go away with treatment while others do not. Follow these instructions at home: Watch your tremor for any changes. The following actions may help to lessen any discomfort you are feeling:  Take medicines only as directed by your health care provider.  Limit alcohol intake to no more than 1 drink per day for nonpregnant women and 2 drinks per day for men. One drink equals 12 oz of beer, 5 oz of wine, or 1 oz of hard  liquor.  Do not use any tobacco products, including cigarettes, chewing tobacco, or electronic cigarettes. If you need help quitting, ask your health care provider.  Avoid extreme heat or cold.  Limit the amount of caffeine you consumeas directed by your health care provider.  Try to get 8 hours of sleep each night.  Find ways to manage your stress, such as meditation or yoga.  Keep all follow-up visits as directed by your health care provider. This is important.  Contact a health care provider if:  You start having a tremor after starting a new medicine.  You have tremor with other symptoms such as: ? Numbness. ? Tingling. ? Pain. ? Weakness.  Your tremor gets worse.  Your tremor interferes with your day-to-day life. This information is not intended to replace advice given to you by your health care provider. Make sure you discuss any questions you have with your health care provider. Document Released: 04/16/2002 Document Revised: 12/28/2015 Document Reviewed: 10/22/2013 Elsevier Interactive Patient Education  [employer].  Please be sure medication list is accurate. If a new problem present, please set up appointment sooner than planned today.

## 2017-08-03 NOTE — Assessment & Plan Note (Signed)
She has been on Eliquis since 11/2016.  We discussed some side effects. Explained that I do not feel comfortable managing Eliquis given the fact she has a retinal hemorrhage on DUB. I recommend following with Dr. Burr Medico to clarify the length of Eliquis treatment. Instructed about warning signs.Marland Kitchen

## 2017-08-03 NOTE — Assessment & Plan Note (Signed)
No changes in current management for now. Further recommendations will be given according to lab results. I instructed patient that is important to follow treatment recommendations, we discussed some side effects of vitamin D supplementation.

## 2017-08-07 ENCOUNTER — Encounter: Payer: Self-pay | Admitting: Family Medicine

## 2017-08-08 ENCOUNTER — Ambulatory Visit: Payer: 59 | Admitting: Family Medicine

## 2017-08-08 ENCOUNTER — Encounter: Payer: Self-pay | Admitting: Family Medicine

## 2017-08-08 ENCOUNTER — Encounter: Payer: Self-pay | Admitting: *Deleted

## 2017-08-08 VITALS — BP 124/76 | HR 88 | Temp 98.6°F | Resp 12 | Ht 70.0 in | Wt 329.2 lb

## 2017-08-08 DIAGNOSIS — W57XXXA Bitten or stung by nonvenomous insect and other nonvenomous arthropods, initial encounter: Secondary | ICD-10-CM

## 2017-08-08 DIAGNOSIS — M797 Fibromyalgia: Secondary | ICD-10-CM

## 2017-08-08 DIAGNOSIS — G894 Chronic pain syndrome: Secondary | ICD-10-CM

## 2017-08-08 DIAGNOSIS — T148XXA Other injury of unspecified body region, initial encounter: Secondary | ICD-10-CM | POA: Diagnosis not present

## 2017-08-08 MED ORDER — METHYLPREDNISOLONE ACETATE 40 MG/ML IJ SUSP: 40.0000 mg | Freq: Once | INTRAMUSCULAR | Status: DC

## 2017-08-08 MED ORDER — METHYLPREDNISOLONE ACETATE 40 MG/ML IJ SUSP
40.0000 mg | Freq: Once | INTRAMUSCULAR | Status: AC
  Administered 2017-08-08: 40 mg via INTRAMUSCULAR

## 2017-08-08 MED ORDER — KETOROLAC TROMETHAMINE 60 MG/2ML IM SOLN
60.0000 mg | Freq: Once | INTRAMUSCULAR | Status: AC
  Administered 2017-08-08: 60 mg via INTRAMUSCULAR

## 2017-08-08 MED ORDER — KETOROLAC TROMETHAMINE 60 MG/2ML IM SOLN: 60.0000 mg | Freq: Once | INTRAMUSCULAR | Status: DC

## 2017-08-08 MED ORDER — TRIAMCINOLONE ACETONIDE 0.5 % EX OINT: 1.0000 "application " | TOPICAL_OINTMENT | Freq: Two times a day (BID) | CUTANEOUS | 0 refills | Status: AC

## 2017-08-08 NOTE — Assessment & Plan Note (Signed)
Certainly symptoms today could be related to fibromyalgia exacerbation. For now no changes in current management, Cymbalta 60 mg daily. Regular, low impact physical activity and good sleep hygiene recommended. These are important part of treatment for this problem. Note for work given today.

## 2017-08-08 NOTE — Progress Notes (Signed)
ACUTE VISIT   HPI:  Chief Complaint  Patient presents with  . Chills    started lastnight  . Generalized Body Aches    possibel Fibromyalgia flare-up    [name] is a 42 y.o. female, who is here today complaining of worsening generalized muscle ache since last night, sudden onset. Pain is exacerbated by movement and light touch of skin all over. She had some chills last night.   Hx of fibromyalgia and polyarthralgia, currently she is on Cymbalta 60 mg daily. She has not noted fever,sore throat,cough,wheezing,abdominal pain,nausea,vomiting, urinary symptoms,or skin rash. + Numbness,tinglig,and tremor stable.  No unusual headache, Hx of migraine,she follows with neurologist.   Recent work up otherwise negative.  No unusual activity or recent trauma.   Skin rash: She is also complaining of pruritic skin lesions, which she attributes to insect bites. Problem is constant and getting worse.  According to patient, she spent the night in somebody else's house.  No new medication, new detergent, new soaps, new medication, sick contacts, or recent travel. She has used OTC treat tea oil.   Review of Systems  Constitutional: Positive for activity change and fatigue. Negative for appetite change, chills and fever.  HENT: Negative for mouth sores, rhinorrhea, sore throat, trouble swallowing and voice change.   Eyes: Negative for discharge and redness.  Respiratory: Negative for cough, shortness of breath and wheezing.   Cardiovascular: Negative for chest pain, palpitations and leg swelling.  Gastrointestinal: Negative for abdominal pain, diarrhea, nausea and vomiting.  Genitourinary: Negative for decreased urine volume, dysuria and hematuria.  Musculoskeletal: Positive for arthralgias, back pain and myalgias. Negative for gait problem and joint swelling.  Skin: Positive for rash. Negative for wound.  Allergic/Immunologic: Positive for environmental  allergies.  Neurological: Negative for syncope, weakness and headaches (No more than usual.).  Psychiatric/Behavioral: Positive for sleep disturbance. Negative for confusion. The patient is nervous/anxious.       Current Outpatient Medications on File Prior to Visit  Medication Sig Dispense Refill  . acetaminophen (TYLENOL) 650 MG CR tablet Take 650 mg by mouth every 8 (eight) hours as needed for pain.    Marland Kitchen ADVAIR DISKUS 250-50 MCG/DOSE AEPB INHALE 1 PUFF INTO THE LUNGS TWICE DAILY 60 each 6  . albuterol (PROVENTIL HFA;VENTOLIN HFA) 108 (90 Base) MCG/ACT inhaler Inhale 1-2 puffs into the lungs every 6 (six) hours as needed for wheezing or shortness of breath. 18 g 2  . albuterol (PROVENTIL) (2.5 MG/3ML) 0.083% nebulizer solution Take 3 mLs (2.5 mg total) by nebulization every 6 (six) hours as needed for wheezing or shortness of breath. 360 mL 5  . baclofen (LIORESAL) 10 MG tablet   0  . Benzonatate (TESSALON PO) Take by mouth.    . calcium carbonate (TUMS - DOSED IN MG ELEMENTAL CALCIUM) 500 MG chewable tablet Chew 1 tablet by mouth daily as needed.     . cetirizine (ZYRTEC) 10 MG tablet Take 10 mg by mouth daily.    . chlorpheniramine-HYDROcodone (TUSSIONEX PENNKINETIC ER) 10-8 MG/5ML SUER Take 5 mLs by mouth every 12 (twelve) hours as needed for cough. 25 mL 0  . DULoxetine (CYMBALTA) 60 MG capsule TAKE 1 CAPSULE BY MOUTH DAILY 90 capsule 1  . ELIQUIS 5 MG TABS tablet TAKE 1 TABLET(5 MG) BY MOUTH TWICE DAILY 60 tablet 2  . fluticasone (FLONASE) 50 MCG/ACT nasal spray SHAKE LIQUID AND USE 1 SPRAY IN EACH NOSTRIL DAILY 16 g 2  . IRON PO Take  by mouth.    . LevoFLOXacin (LEVAQUIN PO) Take by mouth.    . montelukast (SINGULAIR) 10 MG tablet Take 1 tablet (10 mg total) by mouth at bedtime. 30 tablet 5  . omeprazole (PRILOSEC) 40 MG capsule TAKE 1 CAPSULE(40 MG) BY MOUTH DAILY 30 capsule 0  . oxybutynin (DITROPAN) 5 MG tablet Take 1 tablet by mouth 2 (two) times daily.  11  . predniSONE  (DELTASONE) 10 MG tablet Take 2 tablets daily for first 5 days, then take 1 tablet daily for 5 days until finished. 15 tablet 0  . timolol (TIMOPTIC) 0.5 % ophthalmic solution Place 1 drop into both eyes every morning.    . topiramate (TOPAMAX) 25 MG tablet TK 3 TS PO D  1  . traMADol (ULTRAM) 50 MG tablet Take 50 mg as needed by mouth.  0  . Vitamin D, Ergocalciferol, (DRISDOL) 50000 units CAPS capsule 1 cap weekly for 8 weeks then every 2 weeks. 12 capsule 2   No current facility-administered medications on file prior to visit.      Past Medical History:  Diagnosis Date  . Allergy   . Asthma   . Asthma in adult, unspecified asthma severity, uncomplicated 11/07/6965  . Bilateral pulmonary embolism ([place]) 11/03/2016  . Morbid obesity with BMI of 45.0-49.9, adult ([facility])    Allergies  Allergen Reactions  . Other Nausea And Vomiting    Darvocet N-100    Social History   Socioeconomic History  . Marital status: Divorced    Spouse name: Not on file  . Number of children: Not on file  . Years of education: Not on file  . Highest education level: Not on file  Occupational History  . Not on file  Social Needs  . Financial resource strain: Not on file  . Food insecurity:    Worry: Not on file    Inability: Not on file  . Transportation needs:    Medical: Not on file    Non-medical: Not on file  Tobacco Use  . Smoking status: Never Smoker  . Smokeless tobacco: Never Used  Substance and Sexual Activity  . Alcohol use: Yes    Comment: Occas  . Drug use: No  . Sexual activity: Yes    Birth control/protection: IUD    Comment: 1st intercourse 42 yo-More than 5 partners  Mirena 03/30/2017  Lifestyle  . Physical activity:    Days per week: Not on file    Minutes per session: Not on file  . Stress: Not on file  Relationships  . Social connections:    Talks on phone: Not on file    Gets together: Not on file    Attends religious service: Not on file    Active member of club or  organization: Not on file    Attends meetings of clubs or organizations: Not on file    Relationship status: Not on file  Other Topics Concern  . Not on file  Social History Narrative  . Not on file    Vitals:   08/08/17 0817  BP: 124/76  Pulse: 88  Resp: 12  Temp: 98.6 F (37 C)  SpO2: 96%   Body mass index is 47.24 kg/m.   Physical Exam  Nursing note and vitals reviewed. Constitutional: She is oriented to person, place, and time. She appears well-developed. She appears distressed (due to pain).  HENT:  Head: Normocephalic and atraumatic.  Mouth/Throat: Oropharynx is clear and moist and mucous membranes are normal.  Eyes: Pupils are equal, round, and reactive to light. Conjunctivae are normal.  Cardiovascular: Normal rate and regular rhythm.  No murmur heard. Respiratory: Effort normal and breath sounds normal. No respiratory distress.  GI: Soft. She exhibits no mass. There is no hepatomegaly. There is no tenderness.  Musculoskeletal: She exhibits tenderness. She exhibits no edema.  Generalized tenderness upon light touch the skin of upper back, crying; so I did not press on trigger points.  Lymphadenopathy:    She has no cervical adenopathy.  Neurological: She is alert and oriented to person, place, and time. She has normal strength. Gait normal.  Skin: Skin is warm. Rash noted. No ecchymosis noted. Rash is not vesicular. No erythema.     Papular and urticarial rash on lower back, below waist level and bilateral. A few scattered on abdomen and upper extremities.  Psychiatric: Her mood appears anxious. Her affect is labile.  Well groomed, good eye contact.      ASSESSMENT AND PLAN:  Ms.Holly was seen today for chills and generalized body aches.  Orders Placed This Encounter  Procedures  . Ambulatory referral to Pain Clinic     Insect bite, unspecified site, initial encounter  Possible causes discussed. Here in the office Depo Medrol 40 mg IM. She  will continue topical treatment with Triamcinolone. Monitor for new symptoms and f/u as needed.  -     triamcinolone ointment (KENALOG) 0.5 %; Apply 1 application topically 2 (two) times daily for 14 days. -     Discontinue: methylPREDNISolone acetate (DEPO-MEDROL) injection 40 mg -     methylPREDNISolone acetate (DEPO-MEDROL) injection 40 mg  Chronic pain disorder  She agrees with referral to pain management. Here in office and after verbal consent Toradol 60 mg IM given. Tylenol arthritis 650 mg 3 times per day, Ibuprofen 600 mg with food tid,and OTC topical Icy hot or asper cream may help.  -     Ambulatory referral to Pain Clinic -     Discontinue: ketorolac (TORADOL) injection 60 mg -     ketorolac (TORADOL) injection 60 mg    Fibromyalgia muscle pain Certainly symptoms today could be related to fibromyalgia exacerbation. For now no changes in current management, Cymbalta 60 mg daily. Regular, low impact physical activity and good sleep hygiene recommended. These are important part of treatment for this problem. Note for work given today.        Rozell Kettlewell G. [place], MD  [facility] [place] office.

## 2017-08-08 NOTE — Patient Instructions (Signed)
A few things to remember from today's visit:   Fibromyalgia muscle pain - Plan: Ambulatory referral to Pain Clinic  Insect bite, unspecified site, initial encounter  Chronic pain disorder - Plan: Ambulatory referral to Pain Clinic  Monitor for new symptoms. Somebody will be calling you with information about pain clinic appointment.  Today you received Toradol 60 mg and Depo-Medrol 40 mg.  Continue topical triamcinolone on areas with rash.  If you need a FMLA to cover for the days I included on letter given today,please drop forms.   Please be sure medication list is accurate. If a new problem present, please set up appointment sooner than planned today.

## 2017-08-10 ENCOUNTER — Encounter: Payer: Self-pay | Admitting: Gynecology

## 2017-08-10 ENCOUNTER — Ambulatory Visit: Payer: 59 | Admitting: Gynecology

## 2017-08-10 ENCOUNTER — Encounter: Payer: Self-pay | Admitting: Neurology

## 2017-08-10 ENCOUNTER — Other Ambulatory Visit: Payer: Self-pay | Admitting: Family Medicine

## 2017-08-10 ENCOUNTER — Other Ambulatory Visit: Payer: Self-pay | Admitting: Gynecology

## 2017-08-10 VITALS — BP 124/78

## 2017-08-10 DIAGNOSIS — T8332XA Displacement of intrauterine contraceptive device, initial encounter: Secondary | ICD-10-CM | POA: Diagnosis not present

## 2017-08-10 DIAGNOSIS — R251 Tremor, unspecified: Secondary | ICD-10-CM

## 2017-08-10 DIAGNOSIS — N924 Excessive bleeding in the premenopausal period: Secondary | ICD-10-CM | POA: Diagnosis not present

## 2017-08-10 DIAGNOSIS — N939 Abnormal uterine and vaginal bleeding, unspecified: Secondary | ICD-10-CM

## 2017-08-10 NOTE — Progress Notes (Addendum)
    Holly Mcdonald 1975/05/11 [id number]        42 y.o.  G0P0000 presents having had a Mirena IUD placed end of November.  First several cycles were lighter but her last 2 cycles have been very heavy with heavy clots.  No significant cramping or abdominal pain associated with this.  Continues on Eliquis with history of pulmonary embolus.  She is unsure how long they plan on continuing her on this medication.  Past medical history,surgical history, problem list, medications, allergies, family history and social history were all reviewed and documented in the EPIC chart.  Directed ROS with pertinent positives and negatives documented in the history of present illness/assessment and plan.  Exam: [name] assistant Vitals:   08/10/17 0841  BP: 124/78   General appearance:  Normal Abdomen soft nontender without gross masses Pelvic external BUS vagina normal.  Cervix normal.  IUD extruding from the cervical os and subsequently removed, shown to the patient and discarded.  Uterus grossly normal although difficult to palpate.  Midline mobile nontender.  Adnexa without gross masses or tenderness  Assessment/Plan:  42 y.o. G0P0000 with spontaneous extrusion of her Mirena IUD.  Heavy periods over the last 2 cycles.  Exam limited by abdominal girth.  At this point I recommended she proceed with sonohysterogram for pelvic surveillance rule out leiomyoma and cavitary abnormalities that could distort the endometrial cavity leading to the IUD expulsion as well as leading to her heavy menses.  Menorrhagia initially attributable to Eliquis as she did not have this proceeding starting the medication but I want to rule out other abnormalities now and she will schedule in follow-up for this.  I asked her to abstain from intercourse through the sonohysterogram so that we can schedule at any time during her cycle.    Greater than 50% of my 15 minute was spent in direct face to face counseling and  coordination of care with the patient.     Anastasio Auerbach MD, 8:56 AM 08/10/2017

## 2017-08-10 NOTE — Patient Instructions (Signed)
Follow-up for the ultrasound as scheduled. 

## 2017-08-12 ENCOUNTER — Other Ambulatory Visit: Payer: Self-pay | Admitting: Family Medicine

## 2017-08-12 DIAGNOSIS — E559 Vitamin D deficiency, unspecified: Secondary | ICD-10-CM

## 2017-08-15 ENCOUNTER — Other Ambulatory Visit: Payer: Self-pay | Admitting: Family Medicine

## 2017-08-15 DIAGNOSIS — K219 Gastro-esophageal reflux disease without esophagitis: Secondary | ICD-10-CM

## 2017-08-17 ENCOUNTER — Other Ambulatory Visit: Payer: Self-pay | Admitting: Gynecology

## 2017-08-17 ENCOUNTER — Encounter: Payer: Self-pay | Admitting: Gynecology

## 2017-08-17 ENCOUNTER — Ambulatory Visit (INDEPENDENT_AMBULATORY_CARE_PROVIDER_SITE_OTHER): Payer: 59 | Admitting: Gynecology

## 2017-08-17 ENCOUNTER — Ambulatory Visit (INDEPENDENT_AMBULATORY_CARE_PROVIDER_SITE_OTHER): Payer: 59

## 2017-08-17 VITALS — BP 126/80

## 2017-08-17 DIAGNOSIS — D252 Subserosal leiomyoma of uterus: Secondary | ICD-10-CM

## 2017-08-17 DIAGNOSIS — D250 Submucous leiomyoma of uterus: Secondary | ICD-10-CM | POA: Diagnosis not present

## 2017-08-17 DIAGNOSIS — D251 Intramural leiomyoma of uterus: Secondary | ICD-10-CM

## 2017-08-17 DIAGNOSIS — N939 Abnormal uterine and vaginal bleeding, unspecified: Secondary | ICD-10-CM | POA: Diagnosis not present

## 2017-08-17 DIAGNOSIS — N924 Excessive bleeding in the premenopausal period: Secondary | ICD-10-CM

## 2017-08-17 LAB — PREGNANCY, URINE: Preg Test, Ur: NEGATIVE

## 2017-08-17 MED ORDER — NORETHINDRONE ACETATE 5 MG PO TABS: 5.0000 mg | ORAL_TABLET | Freq: Every day | ORAL | 3 refills | Status: DC

## 2017-08-17 NOTE — Progress Notes (Signed)
    Holly Mcdonald 05-20-1975 [id number]        42 y.o.  G0P0000 presents for sonohysterogram with history of menorrhagia.  Recently had spontaneous expulsion of her Mirena IUD.  Ultrasound ordered for pelvic surveillance as physical exam is limited to rule out intracavitary defects that led to expulsion of the IUD.  Past medical history,surgical history, problem list, medications, allergies, family history and social history were all reviewed and documented in the EPIC chart.  Directed ROS with pertinent positives and negatives documented in the history of present illness/assessment and plan.  Exam: [place] assistant BP 126/80 General appearance:  Normal Abdomen soft nontender without masses guarding rebound Pelvic external BUS vagina normal.  Cervix normal.  Uterus difficult to palpate but grossly normal midline mobile nontender.  Adnexa without masses or tenderness.  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  Endometrial echo 10.2 mm.  Right and left ovaries normal.  Cul-de-sac negative.  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  Endometrial sample taken.  Patient tolerated well.  Assessment/Plan:  42 y.o. G0P0000 with menorrhagia and spontaneous expulsion of her IUD.  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  Reviewed situation with the patient.  Options for management discussed.  She is less than 1 year from pulmonary embolus on Eliquis.  Has appointment coming up with hematologist to discuss game plan.  Reports genetic testing negative for thrombosis.  Options to proceed with hysteroscopy D&C with resection of the submucous myoma and to then replace the IUD versus medical management such as progesterone suppression of menstruation such as Aygestin recognizing it is not  contraception and alternative contraception needs to be used.  Depo-Provera/Depo-Lupron or other options.  The issues of increased risk of thrombosis associated with hormones whether estrogen or progesterone discussed.  Patient is already anticoagulated and whether this would be an additional risk reviewed.  At this point the patient is reluctant to proceed with surgery but may want to do that further into the year for financial reasons and would like an attempt at menstrual suppression.  Will start Aygestin 5 mg daily using backup contraception.  She is arrange an appointment with her hematologist and will also discuss this with them to make sure they are comfortable with her on the medication.  She does clearly understand though thrombosis issues associated with medication in her situation.  She will follow-up for the biopsy results in several days from the sonohysterogram.  She will call me if she has any issues or questions.    Anastasio Auerbach MD, 9:52 AM 08/17/2017

## 2017-08-17 NOTE — Patient Instructions (Addendum)
Office will call with biopsy results.  Start on the norethindrone acetate medication daily.  Call me if you have any issues with this. This is not birth control you will need to use backup contraception such as condoms.

## 2017-08-18 ENCOUNTER — Telehealth: Payer: Self-pay | Admitting: Family Medicine

## 2017-08-18 DIAGNOSIS — Z0279 Encounter for issue of other medical certificate: Secondary | ICD-10-CM

## 2017-08-18 NOTE — Telephone Encounter (Signed)
Pt dropped off [employer] form to be completed and faxed to [phone] by April 22/19 and she would also like to be called @ [phone] to pick up a copy.  Form was put in doctors folder for completion.

## 2017-08-19 NOTE — Telephone Encounter (Signed)
I left a detailed message with the information below at the pts cell number.  CRM created also.

## 2017-08-19 NOTE — Telephone Encounter (Signed)
Form placed on Dr Doug Sou desk.

## 2017-08-19 NOTE — Telephone Encounter (Signed)
I can complete FMLA forms, I need to know when she went back to work. I will let pain management to evaluate for disability if appropriate.  Thanks, BJ

## 2017-08-22 NOTE — Telephone Encounter (Signed)
Message sent to Dr. Jordan for review. 

## 2017-08-22 NOTE — Telephone Encounter (Signed)
She returned back to 08/11/16.

## 2017-08-23 NOTE — Progress Notes (Signed)
Holly Mcdonald was seen today in the movement disorders clinic for neurologic consultation at the request of [place], Malka So, MD.  The pt is a 42 y.o. female with a hx of fibromyalgia with associated polyarthragias, PE, asthma and obesity who presents for the evaluation of tremor.  The records that were made available to me were reviewed.  Tremor: Yes.   feels that it is worse after had PE in 10/2016 (states tremor was present most of her life, however)  At rest or with activation?  Rest and activation.    When is it noted the most?  "its always there"  Fam hx of tremor?  No.  Located where?  Both hands equally and sometimes in the legs  Affected by caffeine:  (doesn't drink enough to know)  Affected by alcohol:  No. (drinks rarely)  Affected by stress:  Yes.    Affected by fatigue:  No.  Spills soup if on spoon:  No.  Spills glass of liquid if full:  No.  Affects ADL's (tying shoes, brushing teeth, etc): notes it with brushing teeth   Tremor inducing meds:  Yes.   albuterol (but uses very rarely, not even one time a month)   Neuroimaging of the brain has not previously been performed.    PREVIOUS MEDICATIONS: none to date  ALLERGIES:   Allergies  Allergen Reactions  . Other Nausea And Vomiting    Darvocet N-100    CURRENT MEDICATIONS:  Outpatient Encounter Medications as of 08/25/2017  Medication Sig  . ADVAIR DISKUS 250-50 MCG/DOSE AEPB INHALE 1 PUFF INTO THE LUNGS TWICE DAILY  . albuterol (PROVENTIL HFA;VENTOLIN HFA) 108 (90 Base) MCG/ACT inhaler Inhale 1-2 puffs into the lungs every 6 (six) hours as needed for wheezing or shortness of breath.  Marland Kitchen albuterol (PROVENTIL) (2.5 MG/3ML) 0.083% nebulizer solution Take 3 mLs (2.5 mg total) by nebulization every 6 (six) hours as needed for wheezing or shortness of breath.  . baclofen (LIORESAL) 10 MG tablet   . Benzonatate (TESSALON PO) Take by mouth.  . calcium carbonate (TUMS - DOSED IN MG ELEMENTAL CALCIUM) 500 MG  chewable tablet Chew 1 tablet by mouth daily as needed.   . cetirizine (ZYRTEC) 10 MG tablet Take 10 mg by mouth daily.  . chlorpheniramine-HYDROcodone (TUSSIONEX PENNKINETIC ER) 10-8 MG/5ML SUER Take 5 mLs by mouth every 12 (twelve) hours as needed for cough.  . Cyanocobalamin (VITAMIN B 12 PO) Take by mouth.  . DULoxetine (CYMBALTA) 60 MG capsule TAKE 1 CAPSULE BY MOUTH DAILY  . ELIQUIS 5 MG TABS tablet TAKE 1 TABLET(5 MG) BY MOUTH TWICE DAILY  . fluticasone (FLONASE) 50 MCG/ACT nasal spray SHAKE LIQUID AND USE 1 SPRAY IN EACH NOSTRIL DAILY  . IRON PO Take by mouth.  . LevoFLOXacin (LEVAQUIN PO) Take by mouth.  . montelukast (SINGULAIR) 10 MG tablet Take 1 tablet (10 mg total) by mouth at bedtime.  . norethindrone (AYGESTIN) 5 MG tablet Take 1 tablet (5 mg total) by mouth daily.  Marland Kitchen omeprazole (PRILOSEC) 40 MG capsule TAKE 1 CAPSULE(40 MG) BY MOUTH DAILY  . oxybutynin (DITROPAN) 5 MG tablet Take 1 tablet by mouth 2 (two) times daily.  . predniSONE (DELTASONE) 10 MG tablet Take 2 tablets daily for first 5 days, then take 1 tablet daily for 5 days until finished.  . timolol (TIMOPTIC) 0.5 % ophthalmic solution Place 1 drop into both eyes every morning.  . topiramate (TOPAMAX) 25 MG tablet TK 3 TS PO D  .  Vitamin D, Ergocalciferol, (DRISDOL) 50000 units CAPS capsule TAKE 1 CAPSULE BY MOUTH WEEKLY FOR 8 WEEKS, THEN EVERY 2 WEEKS   No facility-administered encounter medications on file as of 08/25/2017.     PAST MEDICAL HISTORY:   Past Medical History:  Diagnosis Date  . Allergy   . Asthma   . Asthma in adult, unspecified asthma severity, uncomplicated 08/11/100  . Bilateral pulmonary embolism ([place]) 11/03/2016  . Migraine   . Morbid obesity with BMI of 45.0-49.9, adult ([place])     PAST SURGICAL HISTORY:   Past Surgical History:  Procedure Laterality Date  . GYNECOLOGIC CRYOSURGERY     Early 29s  . RECTAL EXAMINATION UNDER ANESTHESIA W/ CYSTOSCOPY    . TONSILLECTOMY    . TRANSTHORACIC  ECHOCARDIOGRAM  11/04/2016    (In setting of bilateral PE) mild LVH. Systolic function was normal.   EF 55-60%. Mild LA dilation. (poor windows)    SOCIAL HISTORY:   Social History   Socioeconomic History  . Marital status: Divorced    Spouse name: Not on file  . Number of children: Not on file  . Years of education: Not on file  . Highest education level: Not on file  Occupational History  . Occupation: representative    Comment: [employer]  Social Needs  . Financial resource strain: Not on file  . Food insecurity:    Worry: Not on file    Inability: Not on file  . Transportation needs:    Medical: Not on file    Non-medical: Not on file  Tobacco Use  . Smoking status: Never Smoker  . Smokeless tobacco: Never Used  Substance and Sexual Activity  . Alcohol use: Yes    Comment: occasionally  . Drug use: No  . Sexual activity: Yes    Comment: 1st intercourse 42 yo-More than 5 partners    Lifestyle  . Physical activity:    Days per week: Not on file    Minutes per session: Not on file  . Stress: Not on file  Relationships  . Social connections:    Talks on phone: Not on file    Gets together: Not on file    Attends religious service: Not on file    Active member of club or organization: Not on file    Attends meetings of clubs or organizations: Not on file    Relationship status: Not on file  . Intimate partner violence:    Fear of current or ex partner: Not on file    Emotionally abused: Not on file    Physically abused: Not on file    Forced sexual activity: Not on file  Other Topics Concern  . Not on file  Social History Narrative  . Not on file    FAMILY HISTORY:   Family Status  Relation Name Status  . Mother  Alive  . Father  Deceased  . PGM  Deceased  . Sister  Alive  . Brother  Alive  . MGM  Alive  . MGF  Deceased  . PGF  Alive  . Mat Aunt  Deceased  . Brother  Alive  . Brother  Alive  . Neg Hx  (Not Specified)    ROS:  Some blood in mucous.   Some CP due to chostochondritis.  Denies lateralizing weakness or paresthesias.  Intermittent feet paresthesias - told to increase vit C due to topamax but didn't help.  A complete 10 system review of systems was obtained and was unremarkable  apart from what is mentioned above.  PHYSICAL EXAMINATION:    VITALS:   Vitals:   08/25/17 0941 08/25/17 0942  BP: (!) 148/96 132/88  Pulse: (!) 108   SpO2: 95%   Weight: (!) 324 lb (147 kg)   Height: 5\' 10"  (1.778 m)     GEN:  The patient appears stated age and is in NAD. HEENT:  Normocephalic, atraumatic.  The mucous membranes are moist. The superficial temporal arteries are without ropiness or tenderness. CV:  RRR Lungs:  CTAB Neck/HEME:  There are no carotid bruits bilaterally.  Neurological examination:  Orientation: The patient is alert and oriented x3. Fund of knowledge is appropriate.  Recent and remote memory are intact.  Attention and concentration are normal.    Able to name objects and repeat phrases. Cranial nerves: There is good facial symmetry. Pupils are equal round and reactive to light bilaterally. Fundoscopic exam reveals clear margins bilaterally. Extraocular muscles are intact. The visual fields are full to confrontational testing. The speech is fluent and clear. Soft palate rises symmetrically and there is no tongue deviation. Hearing is intact to conversational tone. Sensation: Sensation is intact to light and pinprick throughout (facial, trunk, extremities). Vibration is intact at the bilateral big toe.  There is some vibrational splitting on the chest.  There is no extinction with double simultaneous stimulation. There is no sensory dermatomal level identified. Motor: Strength is 5/5 in the bilateral upper and lower extremities.   Shoulder shrug is equal and symmetric.  There is no pronator drift. Deep tendon reflexes: Deep tendon reflexes are 1/4 at the bilateral biceps, triceps, brachioradialis, patella and achilles. Plantar  responses are downgoing bilaterally.  Movement examination: Tone: There is normal tone in the bilateral upper extremities.  The tone in the lower extremities is normal.  Abnormal movements: no tremor of the outstretched hands.  No tremor with finger to nose with eyes closed or open.  No intention tremor.  When asked to pour water from one glass to another, she does not spill the water and the glass was incredibly full.  Archimedes spirals are drawn well. During the history, she does attempt to demonstrate what tremor looks like and holds the L arm in flexed position and there is some movement of the fingers that she states she cannot control.   Coordination:  There is no decremation with RAM's, with any form of RAMS, including alternating supination and pronation of the forearm, hand opening and closing, finger taps, heel taps and toe taps. Gait and Station: The patient arises from the chair with relative ease.  Normal arm swing and good stride lenght  Clinical note:  Pt reports severe pain from fibromyalgia during every aspect of the neuro exam and breathes/sighs heavily to get through the examination.  She does, however, agree to continued examination.  Lab Results  Component Value Date   VITAMINB12 214 08/03/2017     Chemistry      Component Value Date/Time   NA 138 03/15/2017 2048   K 3.8 03/15/2017 2048   CL 103 03/15/2017 2048   CO2 26 03/15/2017 2048   BUN 8 03/15/2017 2048   CREATININE 0.68 03/15/2017 2048      Component Value Date/Time   CALCIUM 9.4 03/15/2017 2048   ALKPHOS 50 12/16/2016 1709   AST 26 12/16/2016 1709   ALT 18 12/16/2016 1709   BILITOT 0.5 12/16/2016 1709       ASSESSMENT/PLAN:  1.  Tremor  -While she may have a  small degree of enhanced physiologic tremor, for the most part I did not see tremor at all today.  There were some nonphysiologic aspects on her examination.  In any case, I told her that I would not recommend any medication for her, even if  tremor was a bit worse.  The reasons were as follows:  A.  she is not a candidate for primidone due to eliquis (unless that can be changed to pradaxa)  B.  she is not a candidate for beta blocker given asthma  C.  she is already on topamax for tx of migraine, which can help for tremor in some patients  -The patient was somewhat frustrated with these recommendations, and asked if she could be on tremor medication if she was off Eliquis.  I still do not recommended given this degree of tremor, or lack thereof  2.  Hx of bilateral PE, on eliquis  -Following with hematology.  She is wondering if she will get off of the medication because she had retinal hemorrhage.  3. feet paresthesias  -could be consequence of topamax, which Dr. Domingo Cocking shared with her as well  -does have b12 deficiency and that could contribute.  Would like to see over 400. Recommend b12 - 1015mcg daily.  Reports that she just started this a few days ago.  Would recommend primary care checking this in 3-4 months.  4.  Fibromyalgia  -diffuse pain on examination.  Thinks that this influences overall health/sense of wellbeing.  5.  Patient does not need a follow-up here in the future and will continue her medical care with her primary care physician and other physicians she is currently seeing, including Dr. Domingo Cocking (her neurologist)    Cc:  [place], Betty G, MD

## 2017-08-23 NOTE — Telephone Encounter (Signed)
Pt was called by Noelle Penner and is aware that there is a charge of $29.00, form was faxed and a copy was given to go to scan and also one was given for charge entry.

## 2017-08-24 ENCOUNTER — Telehealth: Payer: Self-pay

## 2017-08-24 NOTE — Telephone Encounter (Signed)
Patient called stating she thinks she would like to schedule D&C Hysteroscopy for the last week in September. I let her know I only received physician's schedules a few mos in advance. Will call her as soon as I receive the September schedule. She asked about how long to plan to be out of work for surgery. I told her day of and maybe day after for full recovery.

## 2017-08-25 ENCOUNTER — Ambulatory Visit: Payer: 59 | Admitting: Neurology

## 2017-08-25 ENCOUNTER — Encounter: Payer: Self-pay | Admitting: Neurology

## 2017-08-25 VITALS — BP 132/88 | HR 108 | Ht 70.0 in | Wt 324.0 lb

## 2017-08-25 DIAGNOSIS — R251 Tremor, unspecified: Secondary | ICD-10-CM

## 2017-08-25 DIAGNOSIS — E538 Deficiency of other specified B group vitamins: Secondary | ICD-10-CM | POA: Diagnosis not present

## 2017-08-25 DIAGNOSIS — R202 Paresthesia of skin: Secondary | ICD-10-CM | POA: Diagnosis not present

## 2017-08-25 DIAGNOSIS — M797 Fibromyalgia: Secondary | ICD-10-CM

## 2017-08-25 DIAGNOSIS — I2699 Other pulmonary embolism without acute cor pulmonale: Secondary | ICD-10-CM | POA: Diagnosis not present

## 2017-08-25 NOTE — Patient Instructions (Signed)
I would not recommend medication for tremor  I recommend b12 1041mcg daily and recheck in 3-4 months  I will send a letter to your PCP about our discussion today.  Have a great day!

## 2017-08-25 NOTE — Telephone Encounter (Signed)
Patient picked up form and paid $29 form fee

## 2017-08-27 ENCOUNTER — Telehealth: Payer: Self-pay | Admitting: Obstetrics and Gynecology

## 2017-08-27 NOTE — Telephone Encounter (Signed)
Phone call after hours.   Patient calling with heavy menses and known uterine fibroids.  Recent expulsion of Mirena IUD beginning of April, and sonohysterogram 08/17/17 showing multifibroid uterus - with submucous component. EMB benign. Placed on Aygestin 5 mg daily.   Took her last dosage at 3:00 pm today.  LMP started 08/22/17 and became heavy yesterday with golf ball size clots and incontinence pad change every 2 - 3 hours. Some fatigue.  No shortness of breath, palpitations, dizziness, or lightheadedness.  Hx PE and on Eliquis.   I recommended she take a second Aygestin 5 mg now.  Tomorrow, start taking Aygestin 5 mg po bid.   Follow up with Dr. Phineas Real on 08/29/17.   To [facility] during the weekend for increased bleeding or symptoms above.

## 2017-08-29 ENCOUNTER — Telehealth: Payer: Self-pay | Admitting: *Deleted

## 2017-08-29 ENCOUNTER — Encounter (HOSPITAL_COMMUNITY): Payer: Self-pay | Admitting: *Deleted

## 2017-08-29 ENCOUNTER — Inpatient Hospital Stay (HOSPITAL_COMMUNITY)
Admission: AD | Admit: 2017-08-29 | Discharge: 2017-08-29 | Disposition: A | Discharge status: Home / Self Care | Payer: 59 | Source: Ambulatory Visit | Attending: Gynecology | Admitting: Gynecology

## 2017-08-29 DIAGNOSIS — M797 Fibromyalgia: Secondary | ICD-10-CM | POA: Insufficient documentation

## 2017-08-29 DIAGNOSIS — H409 Unspecified glaucoma: Secondary | ICD-10-CM | POA: Diagnosis not present

## 2017-08-29 DIAGNOSIS — N939 Abnormal uterine and vaginal bleeding, unspecified: Secondary | ICD-10-CM | POA: Diagnosis present

## 2017-08-29 DIAGNOSIS — J45909 Unspecified asthma, uncomplicated: Secondary | ICD-10-CM | POA: Diagnosis not present

## 2017-08-29 DIAGNOSIS — Z6841 Body Mass Index (BMI) 40.0 and over, adult: Secondary | ICD-10-CM | POA: Diagnosis not present

## 2017-08-29 DIAGNOSIS — E119 Type 2 diabetes mellitus without complications: Secondary | ICD-10-CM | POA: Diagnosis not present

## 2017-08-29 DIAGNOSIS — Z79899 Other long term (current) drug therapy: Secondary | ICD-10-CM | POA: Insufficient documentation

## 2017-08-29 DIAGNOSIS — Z86711 Personal history of pulmonary embolism: Secondary | ICD-10-CM | POA: Diagnosis not present

## 2017-08-29 DIAGNOSIS — Z7901 Long term (current) use of anticoagulants: Secondary | ICD-10-CM | POA: Insufficient documentation

## 2017-08-29 HISTORY — DX: Retinal hemorrhage, unspecified eye: H35.60

## 2017-08-29 HISTORY — DX: Unspecified glaucoma: H40.9

## 2017-08-29 HISTORY — DX: Anemia, unspecified: D64.9

## 2017-08-29 HISTORY — DX: Fibromyalgia: M79.7

## 2017-08-29 LAB — URINALYSIS, ROUTINE W REFLEX MICROSCOPIC
BACTERIA UA: NONE SEEN
Bilirubin Urine: NEGATIVE
Glucose, UA: NEGATIVE mg/dL
Ketones, ur: NEGATIVE mg/dL
Nitrite: NEGATIVE
PH: 6 (ref 5.0–8.0)
Protein, ur: 30 mg/dL — AB
Specific Gravity, Urine: 1.008 (ref 1.005–1.030)

## 2017-08-29 LAB — CBC
HEMATOCRIT: 38.5 % (ref 36.0–46.0)
HEMOGLOBIN: 13.2 g/dL (ref 12.0–15.0)
MCH: 30.3 pg (ref 26.0–34.0)
MCHC: 34.3 g/dL (ref 30.0–36.0)
MCV: 88.3 fL (ref 78.0–100.0)
Platelets: 269 10*3/uL (ref 150–400)
RBC: 4.36 MIL/uL (ref 3.87–5.11)
RDW: 13.6 % (ref 11.5–15.5)
WBC: 9.1 10*3/uL (ref 4.0–10.5)

## 2017-08-29 LAB — POCT PREGNANCY, URINE: PREG TEST UR: NEGATIVE

## 2017-08-29 MED ORDER — MEGESTROL ACETATE 20 MG PO TABS: ORAL_TABLET | ORAL | 0 refills | Status: DC

## 2017-08-29 NOTE — Telephone Encounter (Signed)
I would recommend Megace 20 mg 3 times daily and then taper down as her bleeding resolves to once daily and to stay on this for now.  I would check with her Eliquis physicians about may be stopping her medication for a couple days if they felt comfortable with this so without her bleeding will also taper down quickly.

## 2017-08-29 NOTE — MAU Provider Note (Signed)
History     CSN: [id number]  Arrival date and time: 08/29/17 1111   First Provider Initiated Contact with Patient 08/29/17 1136      Chief Complaint  Patient presents with  . Vaginal Bleeding  . Fatigue  . Nausea   HPI Holly Mcdonald is a 42 y.o. non pregnant female who presents via EMS for vaginal bleeding. Long history of AUB. More recently had IUD but it was expelled earlier this month. Tx & hx complicated by PE last year and is currently on eliquis.  States her bleeding increased over the weekend & she was passing several golf ball sized clots. Called on call MD & was told that if she started feeling weak that [facility] She called the office this morning and was called in rx for megace but was told by her pharmacy that it is currently out of stock. As of this morning she started feeling weak and nauseated. No CP, SOB, palpitations, or syncope.  Passed 1 clot this morning. Has to change her pads once every 3 hours.  States that her previous hematologist told her she could d/c eliquis for 2-3 days at a time when she's on her period.   Past Medical History:  Diagnosis Date  . Allergy   . Anemia   . Asthma   . Asthma in adult, unspecified asthma severity, uncomplicated 07/15/8586  . Bilateral pulmonary embolism ([place]) 11/03/2016  . Diabetes mellitus without complication ([place])    pre diabetic  . Fibroids   . Fibromyalgia   . Glaucoma   . Migraine   . Morbid obesity with BMI of 45.0-49.9, adult ([place])   . Pulmonary embolism ([place])   . Retinal hemorrhage   . Tremors of nervous system     Past Surgical History:  Procedure Laterality Date  . GYNECOLOGIC CRYOSURGERY     Early 38s  . RECTAL EXAMINATION UNDER ANESTHESIA W/ CYSTOSCOPY    . TONSILLECTOMY    . TRANSTHORACIC ECHOCARDIOGRAM  11/04/2016    (In setting of bilateral PE) mild LVH. Systolic function was normal.   EF 55-60%. Mild LA dilation. (poor windows)    Family History  Problem Relation  Age of Onset  . Pulmonary embolism Mother        Last year  . Mental retardation Mother        PTSD and fibromyalgia  . Heart disease Mother        CHF  . Congestive Heart Failure Mother   . Heart disease Father 13       CAD  . Hyperlipidemia Father   . Diabetes Paternal Grandmother   . Heart attack Paternal Grandmother   . Congestive Heart Failure Maternal Aunt   . Cancer Neg Hx     Social History   Tobacco Use  . Smoking status: Never Smoker  . Smokeless tobacco: Never Used  Substance Use Topics  . Alcohol use: Yes    Comment: occasionally  . Drug use: No    Allergies:  Allergies  Allergen Reactions  . Other Nausea And Vomiting    Darvocet N-100    Medications Prior to Admission  Medication Sig Dispense Refill Last Dose  . ADVAIR DISKUS 250-50 MCG/DOSE AEPB INHALE 1 PUFF INTO THE LUNGS TWICE DAILY 60 each 6 Taking  . albuterol (PROVENTIL HFA;VENTOLIN HFA) 108 (90 Base) MCG/ACT inhaler Inhale 1-2 puffs into the lungs every 6 (six) hours as needed for wheezing or shortness of breath. 18 g 2 Taking  .  albuterol (PROVENTIL) (2.5 MG/3ML) 0.083% nebulizer solution Take 3 mLs (2.5 mg total) by nebulization every 6 (six) hours as needed for wheezing or shortness of breath. 360 mL 5 Taking  . baclofen (LIORESAL) 10 MG tablet   0 Taking  . Benzonatate (TESSALON PO) Take by mouth.   Taking  . calcium carbonate (TUMS - DOSED IN MG ELEMENTAL CALCIUM) 500 MG chewable tablet Chew 1 tablet by mouth daily as needed.    Taking  . cetirizine (ZYRTEC) 10 MG tablet Take 10 mg by mouth daily.   Taking  . chlorpheniramine-HYDROcodone (TUSSIONEX PENNKINETIC ER) 10-8 MG/5ML SUER Take 5 mLs by mouth every 12 (twelve) hours as needed for cough. 25 mL 0 Taking  . Cyanocobalamin (VITAMIN B 12 PO) Take by mouth.   Taking  . DULoxetine (CYMBALTA) 60 MG capsule TAKE 1 CAPSULE BY MOUTH DAILY 90 capsule 1 Taking  . ELIQUIS 5 MG TABS tablet TAKE 1 TABLET(5 MG) BY MOUTH TWICE DAILY 60 tablet 2 Taking   . fluticasone (FLONASE) 50 MCG/ACT nasal spray SHAKE LIQUID AND USE 1 SPRAY IN EACH NOSTRIL DAILY 16 g 2 Taking  . IRON PO Take by mouth.   Taking  . LevoFLOXacin (LEVAQUIN PO) Take by mouth.   Taking  . megestrol (MEGACE) 20 MG tablet Take one tablet by mouth 3 times daily for 3-5 days for heavy bleeding then once daily. 90 tablet 0   . montelukast (SINGULAIR) 10 MG tablet Take 1 tablet (10 mg total) by mouth at bedtime. 30 tablet 5 Taking  . norethindrone (AYGESTIN) 5 MG tablet Take 1 tablet (5 mg total) by mouth daily. 30 tablet 3 Taking  . omeprazole (PRILOSEC) 40 MG capsule TAKE 1 CAPSULE(40 MG) BY MOUTH DAILY 30 capsule 0 Taking  . oxybutynin (DITROPAN) 5 MG tablet Take 1 tablet by mouth 2 (two) times daily.  11 Taking  . predniSONE (DELTASONE) 10 MG tablet Take 2 tablets daily for first 5 days, then take 1 tablet daily for 5 days until finished. 15 tablet 0 Taking  . timolol (TIMOPTIC) 0.5 % ophthalmic solution Place 1 drop into both eyes every morning.   Taking  . topiramate (TOPAMAX) 25 MG tablet TK 3 TS PO D  1 Taking  . Vitamin D, Ergocalciferol, (DRISDOL) 50000 units CAPS capsule TAKE 1 CAPSULE BY MOUTH WEEKLY FOR 8 WEEKS, THEN EVERY 2 WEEKS 12 capsule 0 Taking    Review of Systems  Constitutional: Negative.   Respiratory: Negative for shortness of breath.   Cardiovascular: Negative for chest pain and palpitations.  Gastrointestinal: Positive for abdominal pain and nausea. Negative for diarrhea and vomiting.  Genitourinary: Positive for vaginal bleeding.  Neurological: Positive for weakness. Negative for syncope and headaches.   Physical Exam   Blood pressure 106/85, pulse (!) 108, temperature 98.3 F (36.8 C), resp. rate 18, last menstrual period 08/22/2017, SpO2 99 %.  Physical Exam  Nursing note and vitals reviewed. Constitutional: She is oriented to person, place, and time. She appears well-developed and well-nourished. No distress.  HENT:  Head: Normocephalic and  atraumatic.  Eyes: Conjunctivae are normal. Right eye exhibits no discharge. Left eye exhibits no discharge. No scleral icterus.  Neck: Normal range of motion.  Cardiovascular: Normal rate, regular rhythm and normal heart sounds.  No murmur heard. Respiratory: Effort normal and breath sounds normal. No respiratory distress. She has no wheezes.  GI: Soft. Bowel sounds are normal. There is no tenderness.  Genitourinary: Cervix exhibits no friability. There is bleeding in the  vagina.  Genitourinary Comments: No clots. Small amount of dark red blood at os.   Neurological: She is alert and oriented to person, place, and time.  Skin: Skin is warm and dry. She is not diaphoretic.  Psychiatric: She has a normal mood and affect. Her behavior is normal. Judgment and thought content normal.    MAU Course  Procedures Results for orders placed or performed during the hospital encounter of 08/29/17 (from the past 24 hour(s))  CBC     Status: None   Collection Time: 08/29/17 12:04 PM  Result Value Ref Range   WBC 9.1 4.0 - 10.5 K/uL   RBC 4.36 3.87 - 5.11 MIL/uL   Hemoglobin 13.2 12.0 - 15.0 g/dL   HCT 38.5 36.0 - 46.0 %   MCV 88.3 78.0 - 100.0 fL   MCH 30.3 26.0 - 34.0 pg   MCHC 34.3 30.0 - 36.0 g/dL   RDW 13.6 11.5 - 15.5 %   Platelets 269 150 - 400 K/uL    MDM NAD, mildly tachycardic, otherwise VSS Hemoglobin 13.2 C/w Dr. Phineas Real who is her primary gyn. Ok to discharge home. Will ensure that patient gets rx for megace. If patient was told by hematologist that she could stop eliquis for a few days, then this is what he recommends while the megace starts to work.   Discussed plan with patient. I called her pharmacy and they are transferring the rx to another location that has the megace in stock. Discussed with patient that she can d/c the eliquis for the next day or 2 ONLY if that's what the hematologist discussed with her. If she has any concerns or is unsure, instructed her to speak with  her PCP (she no longer sees her hematologist) prior to doing so. Pt verbalized understanding.   Assessment and Plan  A:  1. Abnormal uterine bleeding (AUB)    P: Discharge home D/c aygestin Start taking megace F/u with Dr. Phineas Real Discussed reasons to return to [place] 08/29/2017, 11:36 AM

## 2017-08-29 NOTE — Telephone Encounter (Signed)
Patient called to update you with on-call conversation with Dr.Silva on 08/27/17. Pt said the clots have decreased in size, but still passing them, still bleeding, still changing pads every 3 hours, still taking Aygestin 5mg  twice daily recommended by Dr.Silva, no new symptoms. Pt will need new Rx for Aygestin 5 mg if she is going to continue twice daily. Please advise

## 2017-08-29 NOTE — Telephone Encounter (Signed)
Left detailed message on voicemail per pt request, Rx sent.

## 2017-08-29 NOTE — Progress Notes (Signed)
Pt presents to MAU via EMS for heavy bleeding.  Pt feeling lethargic and nauseated today and wanted to "make sure I'm not getting anemic."  Pt states passing "golf ball"sized clots. Period started last Monday, but heavier bleeding started Friday.

## 2017-08-29 NOTE — Discharge Instructions (Signed)
Dysfunctional Uterine Bleeding °Dysfunctional uterine bleeding is abnormal bleeding from the uterus. Dysfunctional uterine bleeding includes: °· A period that comes earlier or later than usual. °· A period that is lighter, heavier, or has blood clots. °· Bleeding between periods. °· Skipping one or more periods. °· Bleeding after sexual intercourse. °· Bleeding after menopause. ° °Follow these instructions at home: °Pay attention to any changes in your symptoms. Follow these instructions to help with your condition: °Eating and drinking °· Eat well-balanced meals. Include foods that are high in iron, such as liver, meat, shellfish, green leafy vegetables, and eggs. °· If you become constipated: °? Drink plenty of water. °? Eat fruits and vegetables that are high in water and fiber, such as spinach, carrots, raspberries, apples, and mango. °Medicines °· Take over-the-counter and prescription medicines only as told by your health care provider. °· Do not change medicines without talking with your health care provider. °· Aspirin or medicines that contain aspirin may make the bleeding worse. Do not take those medicines: °? During the week before your period. °? During your period. °· If you were prescribed iron pills, take them as told by your health care provider. Iron pills help to replace iron that your body loses because of this condition. °Activity °· If you need to change your sanitary pad or tampon more than one time every 2 hours: °? Lie in bed with your feet raised (elevated). °? Place a cold pack on your lower abdomen. °? Rest as much as possible until the bleeding stops or slows down. °· Do not try to lose weight until the bleeding has stopped and your blood iron level is back to normal. °Other Instructions °· For two months, write down: °? When your period starts. °? When your period ends. °? When any abnormal bleeding occurs. °? What problems you notice. °· Keep all follow up visits as told by your health  care provider. This is important. °Contact a health care provider if: °· You get light-headed or weak. °· You have nausea and vomiting. °· You cannot eat or drink without vomiting. °· You feel dizzy or have diarrhea while you are taking medicines. °· You are taking birth control pills or hormones, and you want to change them or stop taking them. °Get help right away if: °· You develop a fever or chills. °· You need to change your sanitary pad or tampon more than one time per hour. °· Your bleeding becomes heavier, or your flow contains clots more often. °· You develop pain in your abdomen. °· You lose consciousness. °· You develop a rash. °This information is not intended to replace advice given to you by your health care provider. Make sure you discuss any questions you have with your health care provider. °Document Released: 04/23/2000 Document Revised: 10/02/2015 Document Reviewed: 07/22/2014 °Elsevier Interactive Patient Education © 2018 [employer] ° °

## 2017-08-31 ENCOUNTER — Telehealth: Payer: Self-pay

## 2017-08-31 ENCOUNTER — Telehealth: Payer: Self-pay | Admitting: Family Medicine

## 2017-08-31 NOTE — Telephone Encounter (Signed)
Will send slip

## 2017-08-31 NOTE — Telephone Encounter (Signed)
Please call pt and schedule lab and f/u with me next week if she agrees. Thanks   Truitt Merle MD

## 2017-08-31 NOTE — Telephone Encounter (Signed)
Patient calls stating that her PCP will no longer manager her blood thinner and wants Dr. Burr Medico to manage. Had been diagnosed with retinal hemmorraging diagnosed by her eye doctor which has resolved.  Also she is loosing weight approximately 35 pounds but wants to know how much weight she wants her to loose.

## 2017-08-31 NOTE — Telephone Encounter (Signed)
Copied from [place]. Topic: Referral - Question >> Aug 31, 2017  9:26 AM Margot Ables wrote: Reason for CRM: pt states we did not submit the authorization thru [place] for her appt with Dr. Carles Collet at [facility]. The referral was entered but the authorization was obtained. Please notify pt when received and send authorization to [facility].  Pt needing new referral and authorization thru [place] for Dr. Alois Cliche. Appt is 11/30/17. Her current referral/authorization expires 11/26/17.

## 2017-08-31 NOTE — Telephone Encounter (Addendum)
Patient called stating she does now want to wait until Sept as she had originally planned for surgery.  She is on vacation from work on June 17 week and asked if possible to schedule then.  I was able to schedule her for 10/27/27 at 9:00am at [facility].  Pre op appt with Dr. Loetta Rough was scheduled. We reviewed her ins. benefits and estimated surgery prepymt due by one week before surgery.  I will send her a financial letter and a [facility] pamphlet.  Dr. Phineas Real,. Please advise regarding surgery slip and any special instructions. I explained to patient that I did not have your order and you are out of office but when I receive it I may be calling her back with more instruction.

## 2017-08-31 NOTE — Telephone Encounter (Signed)
Message sent to Dr. Jordan for review and approval. 

## 2017-09-01 ENCOUNTER — Telehealth: Payer: Self-pay | Admitting: Hematology

## 2017-09-01 ENCOUNTER — Telehealth: Payer: Self-pay | Admitting: Family Medicine

## 2017-09-01 NOTE — Telephone Encounter (Signed)
Sent to [employer] like the referral was done but done back in August could you look into the hold on this referral?   Thanks

## 2017-09-01 NOTE — Telephone Encounter (Signed)
Patient called to reschedule she has to work

## 2017-09-01 NOTE — Telephone Encounter (Signed)
Copied from [place] [id number]. Topic: Referral - Status >> Aug 22, 2017  8:48 AM Synthia Innocent wrote: Reason for CRM: Patient checking status of referral to [facility] On[facility]. Please advise  >> Aug 22, 2017 10:47 AM Kyanna Mahrt, Emilio Math, RN wrote: The referral was made, left a voice message for pt with this information and gave number if they would like to call.  >> Sep 01, 2017  8:49 AM Aurelio Brash B wrote: PT called to say she has apt with Dr Burr Medico  On May 9th

## 2017-09-05 ENCOUNTER — Telehealth: Payer: Self-pay | Admitting: Hematology

## 2017-09-05 NOTE — Telephone Encounter (Signed)
Do we need to place a new referral or can she schedule her appt since neuro referral was placed recently?  This is happening a lot, I placed referral then she calls requesting a different provider.  Thanks, BJ

## 2017-09-05 NOTE — Telephone Encounter (Signed)
sch appt per 4/25 sch msg - left vm for pt re appt that was added.

## 2017-09-05 NOTE — Telephone Encounter (Signed)
Neoma Laming - please follow up on this referral. Thanks!

## 2017-09-06 ENCOUNTER — Telehealth: Payer: Self-pay

## 2017-09-06 NOTE — Telephone Encounter (Signed)
I called patient to find out who the MD who manages her Eliquis is so that I can speak with them about how to manage her Eliquis for surgery per Dr. Loetta Rough. She tells me that she has an appointment schedule with her hematologist on 09/15/17 and will be talking with the MD then about all of this. They are part of [facility] so will be able to see our notes in chart.  I will check back on May 9th to see if instructions are in chart for managing Eliquis.

## 2017-09-06 NOTE — Telephone Encounter (Signed)
Stay on Megace 20 mg daily up to 40 mg if bleeding picks up.  Cramping/stitch is probably from her uterus contracting with the fibroids.  Not a whole lot we can do about that right now.

## 2017-09-06 NOTE — Telephone Encounter (Signed)
Dr. Loetta Rough- I called patient for something unrelated. She mentioned that she is still bleeding. Megace slowed it but did not stop it. She assumes that is because of her being on blood thinner and she is prepared to live with it until surgery.  Also, she wanted me to tell you that she is having cramping daily all through her abdomen.  She went on to describe that it is "like a stitch in my uterus".  It is like a contraction. Takes her breath and she has to pause and breathe through it and then it stops/goes away.  She has this 5-10x a day. Was not having this when she saw you last.  Rec?

## 2017-09-06 NOTE — Telephone Encounter (Signed)
At patient's request and per [facility] access note on file I left a detailed message in her voice mail and relayed Dr. Dorette Grate response to her.

## 2017-09-12 ENCOUNTER — Other Ambulatory Visit: Payer: 59

## 2017-09-12 ENCOUNTER — Encounter: Payer: Self-pay | Admitting: Gynecology

## 2017-09-12 ENCOUNTER — Other Ambulatory Visit: Payer: Self-pay | Admitting: Family Medicine

## 2017-09-12 ENCOUNTER — Other Ambulatory Visit: Payer: Self-pay | Admitting: Gynecology

## 2017-09-12 ENCOUNTER — Ambulatory Visit: Payer: 59 | Admitting: Hematology

## 2017-09-12 DIAGNOSIS — K219 Gastro-esophageal reflux disease without esophagitis: Secondary | ICD-10-CM

## 2017-09-12 NOTE — Telephone Encounter (Signed)
I meant megestrol

## 2017-09-12 NOTE — Telephone Encounter (Signed)
Dr. Loetta Rough-  What she is taking in Megestrol. Did you mean that or change Rx to Provera?

## 2017-09-12 NOTE — Telephone Encounter (Signed)
She can take the Provera twice daily as needed for the bleeding.  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  The myomas in the wall of the uterus cannot be.  To remove the myomas from the wall of the uterus is major surgery i.e. to the level of hysterectomy.  I cannot guarantee by removing the portions that are in the cavity that her menses will get lighter but there is a good likelihood that they will.  The degree of surgical risks between hysteroscopy and hysterectomy or myomectomy (removing all the fibroids) is pretty great and I think we should start with the easier/safer procedure first to see if it does not work.

## 2017-09-13 ENCOUNTER — Encounter: Payer: Self-pay | Admitting: Gynecology

## 2017-09-13 NOTE — Progress Notes (Signed)
[place]  Telephone:([phone]) [phone] Fax:(336) [phone]  Clinic Follow Up Note   Patient Care Team: Martinique, Betty G, MD as PCP - General (Family Medicine) 09/15/2017   Referring physician: Martinique, Betty G, MD   CHIEF COMPLAINTS:  F/u IDA  HISTORY OF PRESENTING ILLNESS: 12/20/16 Holly Mcdonald 42 y.o. female is here because of her recent pulmonary embolism. She was referred by her primary care physician Dr. Martinique, she presents to my clinic by herself today.   She thought she had bronchitis which she has now for her birthday vacation on 6/15th. Then she developed chest pain in the id chest a few days later. It hurt her to move. She went back to work on 6/25th. She went to work the next day and she went to urgent care and saw an ER doctor. He did a EKG. She went to ED at [facility] and a CT was done. She was on a heparin drip in hospital. Now she is on Eliquis one twice a day. Since hospitalization she still has chest pain. The pain it reduced but increased due to bronchitis. She had steroid shot earlier this month and the pain had stopped from that. She can feel when her clot breaks. The pain is more of a 5/10 than the original 15/10 in June.   She is always SOB. She does not have much of  Cough.   She has bladder spasms and she is prediabetic and suspect glaucoma. Dr. Elsworth Soho tested her lung function which came back clear.   She has had hip pain since May and she has bilateral lower leg swelling that can become painful. She is in PT for her right hip pain. Her mother has had clots 1 year ago. And her father died of a heart attack. She has been overweight for a long time. She has heavy flow regular periods. She stopped taking birth control in 2017.   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  She was evaluate by pulmonologist Dr. Elsworth Soho on 12/10/2016, who pulmonary function test was normal, she did not feel that this was helpful.    CURRENT  THERAPY: Eliquis 5mg  twice daily, hold for 2-3 days at start of period, she will stop on 09/16/2017 due to her retinal hemorrhage and persistent vaginal bleeding. Oral iron     INTERVAL HISTORY:  Holly Mcdonald is here for a follow up. She was last seen by me in 04/07/2017. She notes that she now has uterine fibroids with her menstrual cycle lasting from 4/15-today. She has tried two intervention medications given by her OB-GYN with no relief.   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 Her left eye astigmatism changed and without her glasses and closing her right eye, she is not able to see clearly. She notes that if she wears glasses her vision is fine. She notes that she noticing her vision blurring more with the increase in bleeding. She is still on eliquis at this time and she stops taking it on day 5 of her menstrual cycle due to baseball size clots. She has an appointment with her opthamalogist on July 21st for further evaluation and monitoring of her bilateral eyes. She also has open angle glaucoma.   Since her last visit to the office, she was evaluated at [facility] for AUB on  08/29/2017. She was given a prescription of megace at the time to aid with her AUB. She does have a D&C/hysteroscopy with myosure scheduled on 10/26/2017. She will also have a progesterone only mirena placed at the same time.   On review of systems, she reports AUB. She denies nasal bleeding, weight loss, and any other symptoms.     MEDICAL HISTORY:  Past Medical History:  Diagnosis Date  . Allergy   . Anemia   . Asthma   . Asthma in adult, unspecified asthma severity, uncomplicated 0/11/8673  . Bilateral pulmonary embolism ([place]) 11/03/2016  . Diabetes mellitus without complication ([place])    pre diabetic  . Fibroids   . Fibromyalgia   . Glaucoma   . Migraine   .  Morbid obesity with BMI of 45.0-49.9, adult ([place])   . Pulmonary embolism ([place])   . Retinal hemorrhage   . Tremors of nervous system     SURGICAL HISTORY: Past Surgical History:  Procedure Laterality Date  . GYNECOLOGIC CRYOSURGERY     Early 32s  . RECTAL EXAMINATION UNDER ANESTHESIA W/ CYSTOSCOPY    . TONSILLECTOMY    . TRANSTHORACIC ECHOCARDIOGRAM  11/04/2016    (In setting of bilateral PE) mild LVH. Systolic function was normal.   EF 55-60%. Mild LA dilation. (poor windows)    SOCIAL HISTORY: Social History   Socioeconomic History  . Marital status: Divorced    Spouse name: Not on file  . Number of children: Not on file  . Years of education: Not on file  . Highest education level: Not on file  Occupational History  . Occupation: representative    Comment: [employer]  Social Needs  . Financial resource strain: Not on file  . Food insecurity:    Worry: Not on file    Inability: Not on file  . Transportation needs:    Medical: Not on file    Non-medical: Not on file  Tobacco Use  . Smoking status: Never Smoker  . Smokeless tobacco: Never Used  Substance and Sexual Activity  . Alcohol use: Yes    Comment: occasionally  . Drug use: No  . Sexual activity: Yes    Birth control/protection: None    Comment: 1st intercourse 42 yo-More than 5 partners    Lifestyle  . Physical activity:    Days per week: Not on file    Minutes per session: Not on file  . Stress: Not on file  Relationships  . Social connections:    Talks on phone: Not on file    Gets together: Not on file    Attends religious service: Not on file    Active member of club or organization: Not on file    Attends meetings of clubs or organizations: Not on file    Relationship status: Not on file  . Intimate partner violence:    Fear of current or ex partner: Not on file    Emotionally abused: Not on file    Physically abused: Not on file    Forced sexual activity: Not on file  Other Topics Concern  . Not  on file  Social History Narrative  . Not on file    FAMILY HISTORY: Family History  Problem Relation Age of Onset  . Pulmonary embolism Mother        Last year  . Mental retardation Mother        PTSD and fibromyalgia  . Heart disease Mother        CHF  .  Congestive Heart Failure Mother   . Heart disease Father 41       CAD  . Hyperlipidemia Father   . Diabetes Paternal Grandmother   . Heart attack Paternal Grandmother   . Congestive Heart Failure Maternal Aunt   . Cancer Neg Hx     ALLERGIES:  is allergic to other.  MEDICATIONS:  Current Outpatient Medications  Medication Sig Dispense Refill  . ADVAIR DISKUS 250-50 MCG/DOSE AEPB INHALE 1 PUFF INTO THE LUNGS TWICE DAILY 60 each 6  . albuterol (PROVENTIL HFA;VENTOLIN HFA) 108 (90 Base) MCG/ACT inhaler Inhale 1-2 puffs into the lungs every 6 (six) hours as needed for wheezing or shortness of breath. 18 g 2  . albuterol (PROVENTIL) (2.5 MG/3ML) 0.083% nebulizer solution Take 3 mLs (2.5 mg total) by nebulization every 6 (six) hours as needed for wheezing or shortness of breath. 360 mL 5  . baclofen (LIORESAL) 10 MG tablet Take 10 mg by mouth 2 (two) times daily as needed for muscle spasms.   0  . Benzonatate (TESSALON PO) Take 1 capsule by mouth daily as needed (cough).     . cetirizine (ZYRTEC) 10 MG tablet Take 10 mg by mouth daily.    . Cyanocobalamin (VITAMIN B 12 PO) Take 1,000 mg by mouth daily.     . DULoxetine (CYMBALTA) 60 MG capsule TAKE 1 CAPSULE BY MOUTH DAILY 90 capsule 1  . ELIQUIS 5 MG TABS tablet TAKE 1 TABLET(5 MG) BY MOUTH TWICE DAILY 60 tablet 2  . fluticasone (FLONASE) 50 MCG/ACT nasal spray SHAKE LIQUID AND USE 1 SPRAY IN EACH NOSTRIL DAILY 16 g 2  . IRON PO Take 65 mg by mouth daily.     . LevoFLOXacin (LEVAQUIN PO) Take 1 tablet by mouth daily as needed (bronchitis).     . megestrol (MEGACE) 20 MG tablet Take one tab po daily. On heavy bleeding days take two tabs daily. 30 tablet 1  . montelukast  (SINGULAIR) 10 MG tablet Take 1 tablet (10 mg total) by mouth at bedtime. 30 tablet 5  . omeprazole (PRILOSEC) 40 MG capsule TAKE 1 CAPSULE(40 MG) BY MOUTH DAILY 30 capsule 0  . oxybutynin (DITROPAN) 5 MG tablet Take 1 tablet by mouth 2 (two) times daily.  11  . predniSONE (DELTASONE) 10 MG tablet Take 2 tablets daily for first 5 days, then take 1 tablet daily for 5 days until finished. 15 tablet 0  . timolol (TIMOPTIC) 0.5 % ophthalmic solution Place 1 drop into both eyes every morning.    . topiramate (TOPAMAX) 25 MG tablet TK 3 TS PO D  1  . Vitamin D, Ergocalciferol, (DRISDOL) 50000 units CAPS capsule TAKE 1 CAPSULE BY MOUTH WEEKLY FOR 8 WEEKS, THEN EVERY 2 WEEKS (Patient taking differently: Take 50,000 Units by mouth every 7 (seven) days. TAKE 1 CAPSULE BY MOUTH WEEKLY FOR 8 WEEKS, THEN EVERY 2 WEEKS) 12 capsule 0   No current facility-administered medications for this visit.     REVIEW OF SYSTEMS:   Constitutional: Denies fevers, chills or abnormal night sweats Eyes: Denies blurriness of vision, double vision or watery eyes Ears, nose, mouth, throat, and face: Denies mucositis or sore throat Respiratory: No SOB or CP. (+) asthma/bronchitis  Cardiovascular: Denies palpitation, chest discomfort (+) lower extremity swelling, resolved Gastrointestinal:  Denies nausea, heartburn or change in bowel habits Skin: Denies abnormal skin rashes Lymphatics: Denies new lymphadenopathy or easy bruising Neurological:Denies numbness, tingling or new weaknesses MSK: (+) fibromyalgia  Behavioral/Psych: Mood is stable,  no new changes  All other systems were reviewed with the patient and are negative.  PHYSICAL EXAMINATION:  ECOG PERFORMANCE STATUS: 1 - Symptomatic but completely ambulatory  Vitals:   09/15/17 0846  BP: (!) 124/92  Pulse: 91  Resp: 18  Temp: 98.2 F (36.8 C)  SpO2: 97%   Filed Weights   09/15/17 0846  Weight: (!) 322 lb 9.6 oz (146.3 kg)    GENERAL:alert, no distress and  comfortable SKIN: skin color, texture, turgor are normal, no rashes or significant lesions EYES: normal, conjunctiva are pink and non-injected, sclera clear OROPHARYNX:no exudate, no erythema and lips, buccal mucosa, and tongue normal  NECK: supple, thyroid normal size, non-tender, without nodularity LYMPH:  no palpable lymphadenopathy in the cervical, axillary or inguinal LUNGS: clear to auscultation and percussion with normal breathing effort HEART: regular rate & rhythm and no murmurs and no lower extremity edema ABDOMEN:abdomen soft, non-tender and normal bowel sounds Musculoskeletal:no cyanosis of digits and no clubbing  PSYCH: alert & oriented x 3 with fluent speech NEURO: no focal motor/sensory deficits  LABORATORY DATA:  I have reviewed the data as listed CBC Latest Ref Rng & Units 09/15/2017 08/29/2017 08/03/2017  WBC 3.9 - 10.3 K/uL 8.2 9.1 7.1  Hemoglobin 11.6 - 15.9 g/dL 12.5 13.2 14.7  Hematocrit 34.8 - 46.6 % 38.0 38.5 44.1  Platelets 145 - 400 K/uL 292 269 274.0    CMP Latest Ref Rng & Units 09/15/2017 03/15/2017 02/13/2017  Glucose 70 - 140 mg/dL 107 110(H) 105(H)  BUN 7 - 26 mg/dL 14 8 8   Creatinine 0.60 - 1.10 mg/dL 0.85 0.68 0.67  Sodium 136 - 145 mmol/L 140 138 137  Potassium 3.5 - 5.1 mmol/L 4.1 3.8 3.9  Chloride 98 - 109 mmol/L 114(H) 103 106  CO2 22 - 29 mmol/L 21(L) 26 24  Calcium 8.4 - 10.4 mg/dL 10.1 9.4 9.1  Total Protein 6.4 - 8.3 g/dL 7.3 - -  Total Bilirubin 0.2 - 1.2 mg/dL 0.3 - -  Alkaline Phos 40 - 150 U/L 58 - -  AST 5 - 34 U/L 14 - -  ALT 0 - 55 U/L 14 - -     PROCEDURES  ECHO 11/04/16 Study Conclusions - Left ventricle: The cavity size was normal. Wall thickness was   increased in a pattern of mild LVH. Systolic function was normal.   The estimated ejection fraction was in the range of 55% to 60%. - Left atrium: The atrium was mildly dilated. Indications:      Pulmonary embolus 415.19.    RADIOGRAPHIC STUDIES: I have personally reviewed  the radiological images as listed and agreed with the findings in the report. US Pelvis (transabdominal Only)  Result Date: 08/17/2017 Ultrasound transvaginal and transabdominal shows uterus enlarged with multiple myomas measuring 52 mm, 38 mm, 36 mm, 30 mm, 20 mm.  Endometrial echo 10.2 mm.  Right and left ovaries normal.  Cul-de-sac negative.  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  Endometrial sample taken.  Patient tolerated well.   [place] Sonohysterogram  Result Date: 08/17/2017 Ultrasound transvaginal and transabdominal shows uterus enlarged with multiple myomas measuring 52 mm, 38 mm, 36 mm, 30 mm, 20 mm.  Endometrial echo 10.2 mm.  Right and left ovaries normal.  Cul-de-sac negative.  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  Endometrial sample taken.  Patient tolerated well.  ASSESSMENT & PLAN:  Holly Mcdonald is a 42 y.o. female who has a history of seasonal allergies, asthma, bronchitis, prediabetic    1. Unprovoked Pulmonary Embolism in 10/2016 -Due to her young age, unprovoked PE, I previously recommended continue anticoagulation indefinitely, if there is no contraindication.   -OK to holds her Eliquis for 2-3 days of the start of her period as to lessen her menorrhagia. -She had IUD placed, with progesterone only, she knows to avoid oral contraceptives, due to the increased risk of thrombosis. -I strongly encourage patient to lose weight, which is risk factor for thrombosis.  She has lost about 50 pounds in the past 9 months. -Unfortunately patient has developed bilateral retinal hemorrhage, which has impacted her vision.  She will continue follow-up with her ophthalmologist.  She also has persistent vaginal bleeding for the past 2 months, is planned to have fibroid surgery next month. -I  recommend patient to stop taking eliquis today due to significant bleeding. She will start taking 81 mg ASA.  She is quite happy with this recommendation -Discussed with the patient the risk of blood clots without the use of a blood thinner, to which the patient is aware of this.  -After surgery, I advised the patient to wait 2-3 days after to restart eliquis again if her OB-GYN agrees, to reduce her postsurgical risk of thrombosis.  She will take it for 3 to 4 weeks. -Advised the patient that if she has another episode of blood clot following d/c eliquis, then she will be on anticoagulants for life.   2. Iron deficient anemia -She has been slightly anemic since she started anticoagulation. This is likely secondary to Eliquis induced menorrhagia. -Previous Iron study showed low iron and ferritin, consistent with iron deficiency. -I suggest she take oral iron, with Valamin C to make sure her levels can become normal.  -I suggest colace as she may get constipated on oral iron -Her 04/04/17 iron study came back improved with Saturation at 14%, overall normal.  -I previously encouraged her to repeat labs ever 2-3 months  -She will continue oral iron, for at least another 3 months. If her period stops while on Mirena I suggest she can stop oral iron.  -If she proceeds to have heavy periods and her iron levels drop, she can see me for IV iron.  -I previously suggested that she holds her Eliquis for 2-3 days of the start of her period as to lessen her menorrhagia.  -She will conitnue to be followed by Dr. Martinique and follow up with me as needed.  -hg at 12.5 today (09/15/2017) -Advised the patient to continue on 65 mg Iron supplements BID with vitamin C.  -Patient iron studies pending today, if the iron studies are low, then I will give the patient IV iron prior to her surgery to aid in her iron levels.    3. Anxiety -Will f/u with Dr. Martinique for any medication or other non medical therapies.   4.  Fibromyalgia -she has aches and pains upon touch from chest, shoulder and axillary regions  -Her mother has  history of fibromyalgia  -I previously suggested she follow up to Dr. Martinique and discuss about this.  -She was diagnosed with fibromyalgia and takes cymbalta for management   5. Dyspnea  -Likely the combination of bilateral PE, obesity, asthma and anxiety -Her pulmonary function test was unremarkable, she saturation very well on room air -I previously encouraged her to continue physical therapy, and try to lose  weight.  6. Obesity -Advised the patient to continue exercise to aid in further weight loss to prevent any future blood clots  PLAN -She will stop her Eliquis due to her recent retinal hemorrhage and persistent vaginal bleeding.  She will restart after her surgery for 2 to 4 weeks.  She will take a baby aspirin when she is off Eliquis  -lab and f/u in 3 months    No problem-specific Assessment & Plan notes found for this encounter.    All questions were answered. The patient knows to call the clinic with any problems, questions or concerns. I spent 15 minutes counseling the patient face to face. The total time spent in the appointment was 20 minutes and more than 50% was on counseling.   This document serves as a record of services personally performed by Truitt Merle, MD. It was created on her behalf by Steva Colder, a trained medical scribe. The creation of this record is based on the scribe's personal observations and the provider's statements to them.   I have reviewed the above documentation for accuracy and completeness, and I agree with the above.    Truitt Merle, MD 09/15/2017  10:05 AM

## 2017-09-14 ENCOUNTER — Telehealth: Payer: Self-pay | Admitting: *Deleted

## 2017-09-14 ENCOUNTER — Other Ambulatory Visit: Payer: Self-pay | Admitting: Gynecology

## 2017-09-14 ENCOUNTER — Encounter: Payer: Self-pay | Admitting: Family Medicine

## 2017-09-14 MED ORDER — MEGESTROL ACETATE 20 MG PO TABS: ORAL_TABLET | ORAL | 1 refills | Status: DC

## 2017-09-14 NOTE — Telephone Encounter (Signed)
Patient called and left message on triage voicemail about megace, I left message for patient to call me.

## 2017-09-15 ENCOUNTER — Inpatient Hospital Stay (HOSPITAL_BASED_OUTPATIENT_CLINIC_OR_DEPARTMENT_OTHER): Payer: 59 | Admitting: Hematology

## 2017-09-15 ENCOUNTER — Inpatient Hospital Stay: Payer: 59 | Attending: Hematology

## 2017-09-15 ENCOUNTER — Encounter: Payer: Self-pay | Admitting: Hematology

## 2017-09-15 ENCOUNTER — Telehealth: Payer: Self-pay

## 2017-09-15 VITALS — BP 124/92 | HR 91 | Temp 98.2°F | Resp 18 | Ht 70.0 in | Wt 322.6 lb

## 2017-09-15 DIAGNOSIS — D259 Leiomyoma of uterus, unspecified: Secondary | ICD-10-CM | POA: Insufficient documentation

## 2017-09-15 DIAGNOSIS — Z86718 Personal history of other venous thrombosis and embolism: Secondary | ICD-10-CM

## 2017-09-15 DIAGNOSIS — R0602 Shortness of breath: Secondary | ICD-10-CM

## 2017-09-15 DIAGNOSIS — J45909 Unspecified asthma, uncomplicated: Secondary | ICD-10-CM | POA: Insufficient documentation

## 2017-09-15 DIAGNOSIS — Z6841 Body Mass Index (BMI) 40.0 and over, adult: Secondary | ICD-10-CM | POA: Diagnosis not present

## 2017-09-15 DIAGNOSIS — H3563 Retinal hemorrhage, bilateral: Secondary | ICD-10-CM | POA: Insufficient documentation

## 2017-09-15 DIAGNOSIS — Z7901 Long term (current) use of anticoagulants: Secondary | ICD-10-CM | POA: Insufficient documentation

## 2017-09-15 DIAGNOSIS — E119 Type 2 diabetes mellitus without complications: Secondary | ICD-10-CM | POA: Diagnosis not present

## 2017-09-15 DIAGNOSIS — Z79899 Other long term (current) drug therapy: Secondary | ICD-10-CM | POA: Insufficient documentation

## 2017-09-15 DIAGNOSIS — F419 Anxiety disorder, unspecified: Secondary | ICD-10-CM | POA: Diagnosis not present

## 2017-09-15 DIAGNOSIS — Z0289 Encounter for other administrative examinations: Secondary | ICD-10-CM

## 2017-09-15 DIAGNOSIS — J40 Bronchitis, not specified as acute or chronic: Secondary | ICD-10-CM | POA: Insufficient documentation

## 2017-09-15 DIAGNOSIS — M7989 Other specified soft tissue disorders: Secondary | ICD-10-CM

## 2017-09-15 DIAGNOSIS — D500 Iron deficiency anemia secondary to blood loss (chronic): Secondary | ICD-10-CM | POA: Diagnosis not present

## 2017-09-15 DIAGNOSIS — Z86711 Personal history of pulmonary embolism: Secondary | ICD-10-CM | POA: Diagnosis not present

## 2017-09-15 DIAGNOSIS — M797 Fibromyalgia: Secondary | ICD-10-CM | POA: Diagnosis not present

## 2017-09-15 LAB — COMPREHENSIVE METABOLIC PANEL
ALBUMIN: 3.9 g/dL (ref 3.5–5.0)
ALT: 14 U/L (ref 0–55)
ANION GAP: 5 (ref 3–11)
AST: 14 U/L (ref 5–34)
Alkaline Phosphatase: 58 U/L (ref 40–150)
BILIRUBIN TOTAL: 0.3 mg/dL (ref 0.2–1.2)
BUN: 14 mg/dL (ref 7–26)
CHLORIDE: 114 mmol/L — AB (ref 98–109)
CO2: 21 mmol/L — AB (ref 22–29)
Calcium: 10.1 mg/dL (ref 8.4–10.4)
Creatinine, Ser: 0.85 mg/dL (ref 0.60–1.10)
GFR calc Af Amer: >60 mL/min (ref >60)
GFR calc non Af Amer: >60 mL/min (ref >60)
Glucose, Bld: 107 mg/dL (ref 70–140)
POTASSIUM: 4.1 mmol/L (ref 3.5–5.1)
SODIUM: 140 mmol/L (ref 136–145)
TOTAL PROTEIN: 7.3 g/dL (ref 6.4–8.3)

## 2017-09-15 LAB — CBC WITH DIFFERENTIAL/PLATELET
BASOS ABS: 0 10*3/uL (ref 0.0–0.1)
Basophils Relative: 1 %
Eosinophils Absolute: 0 10*3/uL (ref 0.0–0.5)
Eosinophils Relative: 0 %
HEMATOCRIT: 38 % (ref 34.8–46.6)
Hemoglobin: 12.5 g/dL (ref 11.6–15.9)
LYMPHS ABS: 2.8 10*3/uL (ref 0.9–3.3)
LYMPHS PCT: 35 %
MCH: 29.8 pg (ref 25.1–34.0)
MCHC: 32.9 g/dL (ref 31.5–36.0)
MCV: 90.7 fL (ref 79.5–101.0)
MONO ABS: 0.5 10*3/uL (ref 0.1–0.9)
Monocytes Relative: 6 %
NEUTROS ABS: 4.9 10*3/uL (ref 1.5–6.5)
Neutrophils Relative %: 58 %
Platelets: 292 10*3/uL (ref 145–400)
RBC: 4.19 MIL/uL (ref 3.70–5.45)
RDW: 13.8 % (ref 11.2–14.5)
WBC: 8.2 10*3/uL (ref 3.9–10.3)

## 2017-09-15 LAB — IRON AND TIBC
Iron: 44 ug/dL (ref 41–142)
SATURATION RATIOS: 13 % — AB (ref 21–57)
TIBC: 336 ug/dL (ref 236–444)
UIBC: 292 ug/dL

## 2017-09-15 LAB — FERRITIN: Ferritin: 37 ng/mL (ref 9–269)

## 2017-09-15 NOTE — Telephone Encounter (Signed)
Printed avs and calender of upcoming appointment. Per 5/9 los 

## 2017-09-16 ENCOUNTER — Encounter: Payer: Self-pay | Admitting: Gynecology

## 2017-09-16 ENCOUNTER — Other Ambulatory Visit: Payer: Self-pay | Admitting: Family Medicine

## 2017-09-16 DIAGNOSIS — R32 Unspecified urinary incontinence: Secondary | ICD-10-CM

## 2017-09-20 ENCOUNTER — Telehealth: Payer: Self-pay

## 2017-09-20 ENCOUNTER — Encounter: Payer: Self-pay | Admitting: Gynecology

## 2017-09-20 NOTE — Telephone Encounter (Signed)
-----   Message from Truitt Merle, MD sent at 09/20/2017  8:39 AM EDT ----- Please let pt know her iron level is slightly low, no need for iv iron for now, continue oral iron (may increase from bid to tid), thanks.   Truitt Merle  09/20/2017

## 2017-09-20 NOTE — Telephone Encounter (Signed)
Per Dr. Burr Medico spoke with patient notifying her that her iron level is slightly low, no need for IV iron at present, increase her oral iron from BID to TID.   Patient verbalized an understanding.

## 2017-09-22 ENCOUNTER — Telehealth: Payer: Self-pay | Admitting: Family Medicine

## 2017-09-22 ENCOUNTER — Encounter: Payer: Self-pay | Admitting: Gynecology

## 2017-09-22 NOTE — Telephone Encounter (Signed)
Copied from [place] [id number]. Topic: Referral - Question >> Sep 22, 2017  8:31 AM Holly Mcdonald F wrote: Pt is needing to speak with someone regarding her pain management referral she thought that it was sent to the pain clinic in April and the pain clinic we referred her to states it was put in in may 7th  Best number [phone]

## 2017-09-23 ENCOUNTER — Encounter: Payer: Self-pay | Admitting: Family Medicine

## 2017-09-23 NOTE — Telephone Encounter (Signed)
Left message per patient on voicemail that at this time Dr. Martinique would not be prescribing any meds and to contact her gyn to see if they would prescribe any pain meds per Dr. Martinique.

## 2017-09-23 NOTE — Telephone Encounter (Signed)
Spoke with patient and she stated that she was told that her pain management referral was put in on May 7th and that it would be a few weeks out before she can be scheduled. After looking through chart, pain management referral was put in on 08/08/17 and has a pending status. Patient stated that it's like she is having contractions and her menstrual has been on since 08/22/17, which is also causing flare-up with Fibromyalgia. Patient stated she needed something for pain and a muscle relaxer. She also stated that she is having surgery on June 10th and will need something due to her referral being put in again on 09/13/17, making appointment scheduling time farther out.

## 2017-09-23 NOTE — Telephone Encounter (Signed)
Recommend contacting her gynecologist. I do not think we need to place another referral for pain management, can you please ask Debora. Thanks, BJ

## 2017-09-26 ENCOUNTER — Encounter: Payer: Self-pay | Admitting: Gynecology

## 2017-09-27 ENCOUNTER — Ambulatory Visit: Payer: 59 | Admitting: Pulmonary Disease

## 2017-09-27 ENCOUNTER — Encounter: Payer: Self-pay | Admitting: Pulmonary Disease

## 2017-09-27 DIAGNOSIS — J45909 Unspecified asthma, uncomplicated: Secondary | ICD-10-CM

## 2017-09-27 DIAGNOSIS — I2699 Other pulmonary embolism without acute cor pulmonale: Secondary | ICD-10-CM

## 2017-09-27 NOTE — Telephone Encounter (Signed)
No such test.  Fibroids are very common and some estimates up to 60% of women will have evidence of fibroids.  They probably do have a genetic linkage noting a higher incidence in the African-American population.  Regardless it makes no difference from a management standpoint.

## 2017-09-27 NOTE — Progress Notes (Signed)
   Subjective:    Patient ID: Holly Mcdonald, female    DOB: April 22, 1976, 42 y.o.   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    She is improved significantly and self discontinued Advair, she has not needed albuterol in the last few months.  She also has not had an attack of bronchitis in the last 6 months.  Unfortunately, she developed retinal hemorrhages and menorrhagia and hysteroscopy is planned by GYN, Eliquis was stopped by hematology  She has lost 40 pounds since her last visit with current weight of 3 1 6  pounds.  Heart rate was high today    Significant tests/ events reviewed  10/2016 CT angiogram of the chest showed acute segmental pulmonary emboli in bilateral lower lobes, no CT evidence of right heart strain. Doppler lower extremity was negative for DVT.  Spirometry 12/2016 >. FEV 1 90%  Review of Systems neg for any significant sore throat, dysphagia, itching, sneezing, nasal congestion or excess/ purulent secretions, fever, chills, sweats, unintended wt loss, pleuritic or exertional cp, hempoptysis, orthopnea pnd or change in chronic leg swelling.   Also denies presyncope, palpitations, heartburn, abdominal pain, nausea, vomiting, diarrhea or change in bowel or urinary habits, dysuria,hematuria, rash, arthralgias, visual complaints, headache, numbness weakness or ataxia.     Objective:   Physical Exam  Gen. Pleasant, obese, in no distress ENT - no lesions, no post nasal drip Neck: No JVD, no thyromegaly, no carotid bruits Lungs: no use of accessory muscles, no dullness to percussion, decreased without rales or rhonchi  Cardiovascular:  Rhythm regular, heart sounds  normal, no murmurs or gallops, no peripheral edema Musculoskeletal: No deformities, no cyanosis or clubbing , no tremors       Assessment & Plan:

## 2017-09-27 NOTE — Assessment & Plan Note (Signed)
Appears to be mild intermittent now. No contraindication to use albuterol on an as-needed basis and if persistent symptoms and she will get back on Advair advised okay to stay off for now

## 2017-09-27 NOTE — Patient Instructions (Signed)
Congratulations on your weight loss!  Good luck with surgery. Agree with restarting Eliquis for a month as soon as possible after surgery Early ambulation

## 2017-09-27 NOTE — Assessment & Plan Note (Signed)
Initially unprovoked and hence lifelong anticoagulation recommended but this has been stopped due to retinal hemorrhages and menorrhagia. I agree that she is at high postoperative risk for VTE and recommend early ambulation as well as restarting Eliquis for a month as soon as possible after surgery. I do believe that her highest risk factor is her obesity and weight loss would definitely help

## 2017-09-29 ENCOUNTER — Encounter: Payer: Self-pay | Admitting: Physical Medicine & Rehabilitation

## 2017-09-30 ENCOUNTER — Telehealth: Payer: Self-pay | Admitting: Family Medicine

## 2017-09-30 NOTE — Telephone Encounter (Signed)
Copied from [place] [id number]. Topic: Referral - Status >> Sep 30, 2017 11:50 AM Scherrie Gerlach wrote: Reason for CRM: pt states she has her appt with pain management,  Dr Posey Pronto on May 31, Friday, and now needs referral to Dr Posey Pronto due to insurance

## 2017-09-30 NOTE — Telephone Encounter (Signed)
Spoke with referral coordinator about referral, Nonah Mattes, she stated that she would take care of it on 09/30/17.

## 2017-10-01 ENCOUNTER — Telehealth: Payer: Self-pay | Admitting: Obstetrics & Gynecology

## 2017-10-01 NOTE — Telephone Encounter (Signed)
42 yo G0 AAF with hx of AUB, menorrhagia, iron deficiency, menorrhagia, fibroid uterus who called reporting her bleeding had really increased today.  She is scheduled for hysteroscopy with myosure on 6/101/19 due to bleeding and submucosal fibroid that is present.  Pt is passing clots now like she was in April.  Has similar bleeding then.  Was started on Megace but initially this was backordered.  She has been taking 20mg  daily since then.  Also, is off Eliquis due to retinal hemorrhagia but on baby ASA now.  H/O unprovoked PE in 6/18.  W/u was negative and has been attributed to her weight.  She has lost 40 pounds during the past year.    Currently denies any SOB, lightheadedness, dizziness, or palpitations.  Has experienced this in the past so knows what these feel like.  Reports she did have a fair amount of cramping yesterday and how thinks that was the precursor to her bleeding.      Hemoglobin was 12.5 on 09/15/17.    Pt is advised to increase Megace to 60mg  qd or 20mg  TID.  She will go ahead and complete this dosing now.  Will repeat tomorrow and continue until bleeding has subsided before decreasing dosage.  Also, she is on iron and will continue.  Fluids encouraged.    Strict precautions for going to ER discussed with pt.  Voices clear understanding and states she will go if any of the above symptoms occur.

## 2017-10-02 ENCOUNTER — Telehealth: Payer: Self-pay | Admitting: Obstetrics & Gynecology

## 2017-10-02 NOTE — Telephone Encounter (Signed)
Spoke with pt today to check on her as she called last night with heavy bleeding.  Increased her Megace to 60mg  daily.  Her bleeding is much, much, much better today.  However, she is going to need a change in her prescription for the Megace as she got the last prescription on 09/19/17.  She will need rx for Megace 20mg  up to TID for bleeding.  #90.  Ask this be called into pharmacy.  Surgery planned for 10/17/17.  Advised I would route this to Dr. Phineas Real to take care of on Tuesday.

## 2017-10-07 ENCOUNTER — Encounter: Payer: Self-pay | Admitting: Family Medicine

## 2017-10-07 ENCOUNTER — Encounter: Payer: 59 | Attending: Physical Medicine & Rehabilitation | Admitting: Physical Medicine & Rehabilitation

## 2017-10-07 ENCOUNTER — Ambulatory Visit: Payer: 59 | Admitting: Physical Medicine & Rehabilitation

## 2017-10-07 ENCOUNTER — Encounter: Payer: Self-pay | Admitting: Physical Medicine & Rehabilitation

## 2017-10-07 ENCOUNTER — Other Ambulatory Visit: Payer: Self-pay

## 2017-10-07 VITALS — BP 93/74 | HR 107 | Ht 70.0 in | Wt 314.2 lb

## 2017-10-07 DIAGNOSIS — G479 Sleep disorder, unspecified: Secondary | ICD-10-CM

## 2017-10-07 DIAGNOSIS — R269 Unspecified abnormalities of gait and mobility: Secondary | ICD-10-CM | POA: Diagnosis not present

## 2017-10-07 DIAGNOSIS — G43009 Migraine without aura, not intractable, without status migrainosus: Secondary | ICD-10-CM | POA: Diagnosis not present

## 2017-10-07 DIAGNOSIS — M545 Low back pain: Secondary | ICD-10-CM | POA: Diagnosis not present

## 2017-10-07 DIAGNOSIS — J449 Chronic obstructive pulmonary disease, unspecified: Secondary | ICD-10-CM | POA: Diagnosis not present

## 2017-10-07 DIAGNOSIS — G43909 Migraine, unspecified, not intractable, without status migrainosus: Secondary | ICD-10-CM | POA: Insufficient documentation

## 2017-10-07 DIAGNOSIS — F419 Anxiety disorder, unspecified: Secondary | ICD-10-CM | POA: Diagnosis not present

## 2017-10-07 DIAGNOSIS — G894 Chronic pain syndrome: Secondary | ICD-10-CM | POA: Diagnosis present

## 2017-10-07 DIAGNOSIS — Z6841 Body Mass Index (BMI) 40.0 and over, adult: Secondary | ICD-10-CM | POA: Diagnosis not present

## 2017-10-07 DIAGNOSIS — H42 Glaucoma in diseases classified elsewhere: Secondary | ICD-10-CM | POA: Insufficient documentation

## 2017-10-07 DIAGNOSIS — N938 Other specified abnormal uterine and vaginal bleeding: Secondary | ICD-10-CM | POA: Diagnosis not present

## 2017-10-07 DIAGNOSIS — I2699 Other pulmonary embolism without acute cor pulmonale: Secondary | ICD-10-CM | POA: Diagnosis not present

## 2017-10-07 DIAGNOSIS — Z86711 Personal history of pulmonary embolism: Secondary | ICD-10-CM | POA: Insufficient documentation

## 2017-10-07 DIAGNOSIS — M791 Myalgia, unspecified site: Secondary | ICD-10-CM | POA: Diagnosis not present

## 2017-10-07 DIAGNOSIS — R251 Tremor, unspecified: Secondary | ICD-10-CM | POA: Diagnosis not present

## 2017-10-07 DIAGNOSIS — D219 Benign neoplasm of connective and other soft tissue, unspecified: Secondary | ICD-10-CM | POA: Insufficient documentation

## 2017-10-07 DIAGNOSIS — E1139 Type 2 diabetes mellitus with other diabetic ophthalmic complication: Secondary | ICD-10-CM | POA: Insufficient documentation

## 2017-10-07 DIAGNOSIS — M797 Fibromyalgia: Secondary | ICD-10-CM | POA: Diagnosis present

## 2017-10-07 MED ORDER — BACLOFEN 10 MG PO TABS: 10.0000 mg | ORAL_TABLET | Freq: Three times a day (TID) | ORAL | 1 refills | Status: DC | PRN

## 2017-10-07 MED ORDER — GABAPENTIN 300 MG PO CAPS: 300.0000 mg | ORAL_CAPSULE | Freq: Three times a day (TID) | ORAL | 1 refills | Status: DC

## 2017-10-07 NOTE — Progress Notes (Signed)
Subjective:    Patient ID: Holly Mcdonald, female    DOB: 02/07/1976, 42 y.o.   MRN: [id number]  HPI 42 year old female with past medical history of tremors, pulmonary embolism, morbid obesity, migraines, fibromyalgia, prediabetes, asthma, fibroids, chronic bronchitis presents with fibromyalgia. Started 10/2016 after diagnosed PE.  Progressively getting worse.  Heat improved the pain.  Fibroids have exacerbated the pain, with surgery planned for 10/17/17.  Activity exacerbates the pain.  All qualities of pain.  Non-radiating.  Constant.  Associated muscle spasms and tingling.  Tylenol does not help.  Cymbalta provides some benefit. Denies falls. Pain limits all activities.   Works as Therapist, art.    Pain Inventory Average Pain 7 Pain Right Now 5 My pain is constant, dull, tingling and aching  In the last 24 hours, has pain interfered with the following? General activity 4 Relation with others 0 Enjoyment of life 4 What TIME of day is your pain at its worst? daytime evening night Sleep (in general) Good  Pain is worse with: walking, bending, sitting and some activites Pain improves with: rest, heat/ice, pacing activities and injections Relief from Meds: no meds  Mobility walk without assistance how many minutes can you walk? 15 ability to climb steps?  yes do you drive?  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  no  Physicians involved in your care Primary care Dr. [name]   Family History  Problem Relation Age of Onset  . Pulmonary embolism Mother        Last year  . Mental retardation Mother        PTSD and fibromyalgia  . Heart disease Mother        CHF  . Congestive Heart Failure Mother   . Heart disease Father 82       CAD  . Hyperlipidemia Father   . Diabetes Paternal  Grandmother   . Heart attack Paternal Grandmother   . Congestive Heart Failure Maternal Aunt   . Cancer Neg Hx    Social History   Socioeconomic History  . Marital status: Divorced    Spouse name: Not on file  . Number of children: Not on file  . Years of education: Not on file  . Highest education level: Not on file  Occupational History  . Occupation: representative    Comment: [employer]  Social Needs  . Financial resource strain: Not on file  . Food insecurity:    Worry: Not on file    Inability: Not on file  . Transportation needs:    Medical: Not on file    Non-medical: Not on file  Tobacco Use  . Smoking status: Never Smoker  . Smokeless tobacco: Never Used  Substance and Sexual Activity  . Alcohol use: Yes    Comment: occasionally  . Drug use: No  . Sexual activity: Yes    Birth control/protection: None    Comment: 1st intercourse 42 yo-More than 5 partners    Lifestyle  . Physical activity:    Days per week: Not on file    Minutes per session: Not on file  . Stress: Not on file  Relationships  . Social connections:    Talks on phone: Not on file    Gets together: Not on file    Attends religious service: Not on file    Active  member of club or organization: Not on file    Attends meetings of clubs or organizations: Not on file    Relationship status: Not on file  Other Topics Concern  . Not on file  Social History Narrative  . Not on file   Past Surgical History:  Procedure Laterality Date  . GYNECOLOGIC CRYOSURGERY     Early 9s  . RECTAL EXAMINATION UNDER ANESTHESIA W/ CYSTOSCOPY    . TONSILLECTOMY    . TRANSTHORACIC ECHOCARDIOGRAM  11/04/2016    (In setting of bilateral PE) mild LVH. Systolic function was normal.   EF 55-60%. Mild LA dilation. (poor windows)   Past Medical History:  Diagnosis Date  . Allergy   . Anemia   . Asthma   . Asthma in adult, unspecified asthma severity, uncomplicated 11/10/1285  . Bilateral pulmonary embolism ([place])  11/03/2016  . Diabetes mellitus without complication ([place])    pre diabetic  . Fibroids   . Fibromyalgia   . Glaucoma   . Migraine   . Morbid obesity with BMI of 45.0-49.9, adult ([place])   . Pulmonary embolism ([place])   . Retinal hemorrhage   . Tremors of nervous system    BP 93/74   Pulse (!) 107   Ht 5\' 10"  (1.778 m)   Wt (!) 314 lb 3.2 oz (142.5 kg)   LMP 08/22/2017   SpO2 96%   BMI 45.08 kg/m   Opioid Risk Score:   Fall Risk Score:  `1  Depression screen PHQ 2/9  Depression screen PHQ 2/9 10/07/2017  Decreased Interest 0  Down, Depressed, Hopeless 0  PHQ - 2 Score 0  Altered sleeping 1  Tired, decreased energy 3  Change in appetite 3  Feeling bad or failure about yourself  0  Trouble concentrating 0  Moving slowly or fidgety/restless 0  Suicidal thoughts 0  PHQ-9 Score 7  Difficult doing work/chores Somewhat difficult   Review of Systems  Constitutional: Negative.   HENT: Negative.   Eyes: Negative.   Respiratory: Negative.   Cardiovascular: Negative.   Gastrointestinal: Positive for abdominal pain.  Endocrine: Negative.   Genitourinary: Positive for menstrual problem, pelvic pain, urgency and vaginal bleeding.  Musculoskeletal: Negative.   Skin: Negative.   Allergic/Immunologic: Negative.   Neurological: Negative.   Hematological: Negative.   All other systems reviewed and are negative.     Objective:   Physical Exam Gen: Vital signs reviewed. Distressed HENT: Normocephalic, Atraumatic Eyes: EOMI. No discharge.  Cardio: RRR. No JVD. Pulm: B/l clear to auscultation.  Effort normal Abd: Nondistended, BS+ MSK:  Gait antalgic with heel/toe ambulation.   TTP diffusely with minimal touch b/l UE and upper back.    No edema.  Neuro:  Sensation intact to light touch in all UE dermatomes  Strength  4/5 in all UE myotomes (pain inhibition)    Skin: Warm and Dry. Intact    Assessment & Plan:  42 year old female with past medical history of tremors, pulmonary  embolism, morbid obesity, migraines, fibromyalgia, prediabetes, asthma, fibroids, chronic bronchitis presents with pain all over.   1. Chronic mechanical low back pain  Will avoid NSAIDs due to DOAC and PE  Will order neck xray  Labs reviewed  Referral information reviewed  PMAWARE reviewed  Cont Heat, trial cold  Will order PT after Fibroid surgery in 2 weeks  Will order TENS  Recommend Lidoderm OTC  Cont Cymbalta 60mg  daily with food  Will order Gabapentin 300 TID  Will order Baclofen 10 TID  Will consider referral to Psychology  Will consider accupuncture   2. Gait abnormality  Does not believe she needs cane at present  3. Sleep disturbance  Cont meds  4. Morbid Obesity  Cont to follow up with nutritionist  5. Myalgia   Will consider trigger point injections  See #1  6. Migraines  Cont Topamax

## 2017-10-10 ENCOUNTER — Other Ambulatory Visit: Payer: Self-pay | Admitting: Family Medicine

## 2017-10-10 ENCOUNTER — Encounter (HOSPITAL_BASED_OUTPATIENT_CLINIC_OR_DEPARTMENT_OTHER): Payer: Self-pay | Admitting: *Deleted

## 2017-10-10 DIAGNOSIS — K219 Gastro-esophageal reflux disease without esophagitis: Secondary | ICD-10-CM

## 2017-10-11 ENCOUNTER — Encounter (HOSPITAL_BASED_OUTPATIENT_CLINIC_OR_DEPARTMENT_OTHER): Payer: Self-pay | Admitting: *Deleted

## 2017-10-11 ENCOUNTER — Other Ambulatory Visit: Payer: Self-pay

## 2017-10-11 NOTE — Progress Notes (Addendum)
Spoke w/ pt via phone for pre-op interview.  Npo after mn.  Arrive at 0530.  Getting cbc, cmet, and hCG serum done Friday 10-14-2017 @ 0900.  Current ekg and cxr in chart and epic.  Will take prilosec am dos w/ sips of water.  Per pt eliquis was stopped 03/ 2019 due to retinal hemarrhage and to start back after surgery, is taking asa 81mg . hemotologist noted , dr Burr Medico, dated 09-15-2017 in chart and epic.

## 2017-10-13 ENCOUNTER — Encounter: Payer: Self-pay | Admitting: Gynecology

## 2017-10-13 ENCOUNTER — Ambulatory Visit: Payer: 59 | Admitting: Gynecology

## 2017-10-13 ENCOUNTER — Telehealth: Payer: Self-pay

## 2017-10-13 VITALS — BP 132/82

## 2017-10-13 DIAGNOSIS — D252 Subserosal leiomyoma of uterus: Secondary | ICD-10-CM

## 2017-10-13 DIAGNOSIS — N924 Excessive bleeding in the premenopausal period: Secondary | ICD-10-CM

## 2017-10-13 DIAGNOSIS — D251 Intramural leiomyoma of uterus: Secondary | ICD-10-CM | POA: Diagnosis not present

## 2017-10-13 DIAGNOSIS — D250 Submucous leiomyoma of uterus: Secondary | ICD-10-CM

## 2017-10-13 MED ORDER — MEGESTROL ACETATE 40 MG PO TABS: 40.0000 mg | ORAL_TABLET | Freq: Two times a day (BID) | ORAL | 1 refills | Status: DC

## 2017-10-13 MED ORDER — MEGESTROL ACETATE 40 MG PO TABS: 40.0000 mg | ORAL_TABLET | Freq: Every day | ORAL | 1 refills | Status: DC

## 2017-10-13 NOTE — Telephone Encounter (Signed)
Patient said you told her she could use the Megace 1-2 pills daily and to take 2 pills daily when bleeding increases. You sent Rx for #60 however directions were take one tab daily. Pharmacy cannot give her #60 with those directions. She asked if you will resend it with the take 1 -2 tabs daily so they can give her #60.  Ok to resend with these directions?

## 2017-10-13 NOTE — H&P (Signed)
Holly Mcdonald [birth year] [id number]   History and Physical  Chief complaint: Menorrhagia, leiomyoma  History of present illness: 42 y.o. G0P0000 with history of pulmonary embolus on Eliquis.  Menses became very heavy over the past year.  Trial of Mirena IUD spontaneously extruded.  Subsequent ultrasound/sonohysterogram showed multiple myomas with a submucous component measuring 27 x 32 mm.  Endometrial biopsy showed secratory endometrium.  Patient requiring daily Megace to control her bleeding.  Patient is admitted for hysteroscopic resection of her submucous myoma.  Past Medical History:  Diagnosis Date  . Anticoagulated    eliquis  . Bladder spasm   . Fibromyalgia   . GERD (gastroesophageal reflux disease)   . Heavy menstrual bleeding   . History of chronic bronchitis   . History of pulmonary embolism 10/2016--- followed by pcp and pulmoloigst   dx bilateral PE , bilateral lower lobes , segmental , unprovoked, negative hypercoagulable work-up-- treated w/ eliquis  . Iron deficiency anemia   . Migraine   . Mild intermittent asthma    pulmologist-  dr Elsworth Soho  . Morbid obesity with BMI of 45.0-49.9, adult ([place])   . Open-angle glaucoma of both eyes   . Pre-diabetes   . 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 Urgency of urination   . Uterine fibroid   . Vitamin D deficiency   . Wears glasses     Past Surgical History:  Procedure Laterality Date  . ANAL EXAMINATION UNDER ANESTHESIA  age 29   w/ removal cyst  . GYNECOLOGIC CRYOSURGERY  age 89s  . TONSILLECTOMY    . TRANSTHORACIC ECHOCARDIOGRAM  01/17/2017   ef 60-65%/  mild TR    Family History  Problem Relation Age of Onset  . Pulmonary embolism Mother        Last year  . Mental retardation Mother        PTSD and fibromyalgia  . Heart disease Mother        CHF  . Congestive Heart Failure Mother   . Heart disease Father 63       CAD  . Hyperlipidemia Father   . Diabetes Paternal  Grandmother   . Heart attack Paternal Grandmother   . Congestive Heart Failure Maternal Aunt   . Cancer Neg Hx     Social History:  reports that she has never smoked. She has never used smokeless tobacco. She reports that she drinks alcohol. She reports that she does not use drugs.  Allergies:  Allergies  Allergen Reactions  . Other Nausea And Vomiting    Darvocet N-100    Medications: See epic for the most current listing  ROS:  Was performed and pertinent positives and negatives are included in the history of present illness.  Exam: [name] assistant 10/13/2017 Vitals:   09/02/17 1418 10/11/17 0957  Weight: (!) 324 lb (147 kg) (!) 314 lb (142.4 kg)  Height: 5\' 10"  (1.778 m) 5\' 10"  (1.778 m)   General: well developed, well nourished female, no acute distress HEENT: normal  Lungs: clear to auscultation without wheezing, rales or rhonchi  Cardiac: regular rate without rubs, murmurs or gallops  Abdomen: soft, nontender without masses, guarding, rebound, organomegaly  Pelvic: external bus vagina: Menstrual type flow Cervix: grossly normal  Uterus: Difficult to palpate but not grossly enlarged  Adnexa: without gross masses or tenderness     Assessment/Plan:  42 y.o. G0P0000 with history of menorrhagia on Eliquis for history of pulmonary embolus.  Trial of IUD  spontaneously extruded.  Follow-up ultrasound confirmed multiple myomas with submucous component.  Patient has continued to bleed on and off requiring Megace to control the bleeding.  Patient is scheduled for hysteroscopic resection of her submucous myoma and an attempt to help control her bleeding.    The patient and I reviewed that she has multiple myomas and that we are only addressing those components within the endometrial cavity.  She understands that we are not removing all of her myomas.  Her bleeding may continue or worsen following the procedure.  Options for post surgical management were reviewed and we discussed  whether to replace the IUD to see if this does not help we will consider Depo-Lupron for now temporarily which would address the other myomas and hopefully cease all bleeding.  At this point we will going to proceed with Depo-Lupron postoperatively and will go ahead make arrangements for this.  We discussed what is involved with the Depo-Lupron and side effects to include menopausal symptoms and accelerated bone loss.  I reviewed the proposed surgery with the patient to include the expected intraoperative and postoperative courses as well as the recovery period. The use of the hysteroscope, resectoscope and the D&C portion were all discussed. The risks of surgery to include infection, prolonged antibiotics, hemorrhage necessitating transfusion and the risks of transfusion, including transfusion reaction, hepatitis, HIV, mad cow disease and other unknown entities were all discussed understood and accepted. The risk of damage to internal organs during the procedure, either immediately recognized or delay recognized, including vagina, cervix, uterus, possible perforation causing damage to bowel, bladder, ureters, vessels and nerves necessitating major exploratory reparative surgery and future reparative surgeries including bladder repair, ureteral damage repair, bowel resection, ostomy formation was also discussed understood and accepted. The patient's questions were answered to her satisfaction and she is ready to proceed with surgery.  She has been seen preoperatively by her Eliquis physician and the plans are to discontinue her Eliquis now and reinitiate 1 or 2 days postoperative.  She also is on low-dose aspirin and is going to stop that also and reinitiate postoperatively.    Anastasio Auerbach MD, 10:04 AM 10/13/2017

## 2017-10-13 NOTE — Patient Instructions (Signed)
Followup for surgery as scheduled. 

## 2017-10-13 NOTE — Telephone Encounter (Signed)
Spoke with patient and informed her Rx has been resent.

## 2017-10-13 NOTE — Progress Notes (Signed)
Holly Mcdonald 20-Feb-1976 [id number]        42 y.o.  G0P0000 presents preoperatively for her upcoming hysteroscopy D&C for submucous myomas.  History of menorrhagia.  Had IUD placed which spontaneously excluded.  Follow-up sonohysterogram 08/2017 showed multiple myomas measuring 52 mm, 38 mm, 36 mm, 30 mm and 20 mm.  Endometrial cavity on distention showed submucous myoma measuring 27 x 32 mm.  Endometrial biopsy showed early secratory endometrium.  Patient has bled on and off since requiring Megace to control her bleeding.  Situation complicated by patient taking Eliquis for pulmonary embolus.  Past medical history,surgical history, problem list, medications, allergies, family history and social history were all reviewed and documented in the EPIC chart.  Directed ROS with pertinent positives and negatives documented in the history of present illness/assessment and plan.  Exam: [name] assistant Vitals:   10/13/17 0917  BP: 132/82   General appearance:  Normal HEENT normal Cardiac regular rate without rubs murmurs or gallops Lungs clear bilaterally Abdomen obese soft nontender without masses or guarding Pelvic external BUS vagina with menstrual type flow.  Cervix normal.  Uterus difficult to palpate but not significantly enlarged.  Adnexa without gross masses or tenderness.  Assessment/Plan:  42 y.o. G0P0000 with history of menorrhagia on Eliquis for history of pulmonary embolus.  Trial of IUD spontaneously extruded.  Follow-up ultrasound confirmed multiple myomas with submucous component.  Patient has continued to bleed on and off requiring Megace to control the bleeding.  Patient is scheduled for hysteroscopic resection of her submucous myoma and an attempt to help control her bleeding.  Patient and I reviewed that she has multiple myomas and that we are only addressing those components within the endometrial cavity.  She understands that we are not removing all of her  myomas.  Her bleeding may continue or worsen following the procedure.  Options for post surgical management were reviewed and we discussed whether to replace the IUD to see if this does not help we will consider Depo-Lupron for now temporarily which would address the other myomas and hopefully cease all bleeding.  At this point we will going to proceed with Depo-Lupron postoperatively and will go ahead make arrangements for this.  We discussed was involved with the Depo-Lupron and side effects to include menopausal symptoms and accelerated bone loss.  I reviewed the proposed surgery with the patient to include the expected intraoperative and postoperative courses as well as the recovery period. The use of the hysteroscope, resectoscope and the D&C portion were all discussed. The risks of surgery to include infection, prolonged antibiotics, hemorrhage necessitating transfusion and the risks of transfusion, including transfusion reaction, hepatitis, HIV, mad cow disease and other unknown entities were all discussed understood and accepted. The risk of damage to internal organs during the procedure, either immediately recognized or delay recognized, including vagina, cervix, uterus, possible perforation causing damage to bowel, bladder, ureters, vessels and nerves necessitating major exploratory reparative surgery and future reparative surgeries including bladder repair, ureteral damage repair, bowel resection, ostomy formation was also discussed understood and accepted. The patient's questions were answered to her satisfaction and she is ready to proceed with surgery.  She has been seen preoperatively by her Eliquis physician and the plans are to discontinue her Eliquis now and reinitiate 1 or 2 days postoperative.  She also is on low-dose aspirin and is going to stop that now and reinitiated postoperatively.  I refilled her Megace 40 mg number 60 tablet to have available postoperatively  to control any significant  bleeding.      Anastasio Auerbach MD, 9:54 AM 10/13/2017

## 2017-10-14 ENCOUNTER — Encounter (HOSPITAL_COMMUNITY)
Admission: RE | Admit: 2017-10-14 | Discharge: 2017-10-14 | Disposition: A | Discharge status: Home / Self Care | Payer: 59 | Source: Ambulatory Visit | Attending: Gynecology | Admitting: Gynecology

## 2017-10-14 ENCOUNTER — Telehealth: Payer: Self-pay | Admitting: Obstetrics and Gynecology

## 2017-10-14 ENCOUNTER — Other Ambulatory Visit: Payer: Self-pay | Admitting: Obstetrics and Gynecology

## 2017-10-14 DIAGNOSIS — R7303 Prediabetes: Secondary | ICD-10-CM | POA: Diagnosis not present

## 2017-10-14 DIAGNOSIS — Z86711 Personal history of pulmonary embolism: Secondary | ICD-10-CM | POA: Diagnosis not present

## 2017-10-14 DIAGNOSIS — Z79899 Other long term (current) drug therapy: Secondary | ICD-10-CM | POA: Diagnosis not present

## 2017-10-14 DIAGNOSIS — Z7982 Long term (current) use of aspirin: Secondary | ICD-10-CM | POA: Diagnosis not present

## 2017-10-14 DIAGNOSIS — D250 Submucous leiomyoma of uterus: Secondary | ICD-10-CM | POA: Diagnosis not present

## 2017-10-14 DIAGNOSIS — K219 Gastro-esophageal reflux disease without esophagitis: Secondary | ICD-10-CM | POA: Diagnosis not present

## 2017-10-14 DIAGNOSIS — J45909 Unspecified asthma, uncomplicated: Secondary | ICD-10-CM | POA: Diagnosis not present

## 2017-10-14 DIAGNOSIS — M797 Fibromyalgia: Secondary | ICD-10-CM | POA: Diagnosis not present

## 2017-10-14 DIAGNOSIS — Z6841 Body Mass Index (BMI) 40.0 and over, adult: Secondary | ICD-10-CM | POA: Diagnosis not present

## 2017-10-14 DIAGNOSIS — F419 Anxiety disorder, unspecified: Secondary | ICD-10-CM | POA: Diagnosis not present

## 2017-10-14 DIAGNOSIS — N950 Postmenopausal bleeding: Secondary | ICD-10-CM | POA: Diagnosis present

## 2017-10-14 DIAGNOSIS — Z7902 Long term (current) use of antithrombotics/antiplatelets: Secondary | ICD-10-CM | POA: Diagnosis not present

## 2017-10-14 LAB — COMPREHENSIVE METABOLIC PANEL
ALBUMIN: 4.1 g/dL (ref 3.5–5.0)
ALT: 16 U/L (ref 14–54)
AST: 17 U/L (ref 15–41)
Alkaline Phosphatase: 49 U/L (ref 38–126)
Anion gap: 7 (ref 5–15)
BUN: 9 mg/dL (ref 6–20)
CHLORIDE: 113 mmol/L — AB (ref 101–111)
CO2: 20 mmol/L — AB (ref 22–32)
Calcium: 9.3 mg/dL (ref 8.9–10.3)
Creatinine, Ser: 0.79 mg/dL (ref 0.44–1.00)
GFR calc Af Amer: >60 mL/min (ref >60)
GFR calc non Af Amer: >60 mL/min (ref >60)
GLUCOSE: 107 mg/dL — AB (ref 65–99)
POTASSIUM: 3.6 mmol/L (ref 3.5–5.1)
Sodium: 140 mmol/L (ref 135–145)
Total Bilirubin: 0.7 mg/dL (ref 0.3–1.2)
Total Protein: 7 g/dL (ref 6.5–8.1)

## 2017-10-14 LAB — HCG, SERUM, QUALITATIVE: Preg, Serum: NEGATIVE

## 2017-10-14 LAB — CBC
HCT: 33.3 % — ABNORMAL LOW (ref 36.0–46.0)
Hemoglobin: 11 g/dL — ABNORMAL LOW (ref 12.0–15.0)
MCH: 29.9 pg (ref 26.0–34.0)
MCHC: 33 g/dL (ref 30.0–36.0)
MCV: 90.5 fL (ref 78.0–100.0)
PLATELETS: 313 10*3/uL (ref 150–400)
RBC: 3.68 MIL/uL — AB (ref 3.87–5.11)
RDW: 13.2 % (ref 11.5–15.5)
WBC: 5.9 10*3/uL (ref 4.0–10.5)

## 2017-10-14 MED ORDER — MISOPROSTOL 200 MCG PO TABS: ORAL_TABLET | ORAL | 0 refills | Status: DC

## 2017-10-14 NOTE — Progress Notes (Signed)
See phone note.  Cytotec 200 mcg, place 2 tablets vaginally the night prior to surgery was called into her pharmacy.

## 2017-10-14 NOTE — Telephone Encounter (Signed)
The patient called, stated that Dr Denman George didn't call in the medicine she needs to soften her cervix prior to her surgery on Monday. Patient is having a hysteroscopic myomectomy on Monday. Cytotec 200 mcg, place 2 tablets vaginally the night prior to surgery was called into her pharmacy.

## 2017-10-14 NOTE — Telephone Encounter (Signed)
I would recommend NO waxing right before.  I usually take pictures

## 2017-10-16 NOTE — Anesthesia Preprocedure Evaluation (Addendum)
Anesthesia Evaluation  Patient identified by MRN, date of birth, ID band Patient awake    Reviewed: Allergy & Precautions, NPO status , Patient's Chart, lab work & pertinent test results  Airway Mallampati: I  TM Distance: >3 FB Neck ROM: Full    Dental  (+) Dental Advisory Given, Caps   Pulmonary asthma , PE   breath sounds clear to auscultation       Cardiovascular  Rhythm:Regular Rate:Normal  '18 TTE - EF 60-65%, mild MVP without MR, mild TR   Neuro/Psych  Headaches, Anxiety    GI/Hepatic Neg liver ROS, GERD  Medicated and Controlled,  Endo/Other  Morbid obesityPre-DM  Renal/GU negative Renal ROS Bladder dysfunction      Musculoskeletal  (+) Fibromyalgia -  Abdominal (+) + obese,   Peds  Hematology  (+) anemia ,   Anesthesia Other Findings Open angle glaucoma  Reproductive/Obstetrics                            Anesthesia Physical Anesthesia Plan  ASA: III  Anesthesia Plan: General   Post-op Pain Management:    Induction: Intravenous  PONV Risk Score and Plan: 3 and Treatment may vary due to age or medical condition, Ondansetron, Dexamethasone and Midazolam  Airway Management Planned: LMA  Additional Equipment: None  Intra-op Plan:   Post-operative Plan: Extubation in OR  Informed Consent: I have reviewed the patients History and Physical, chart, labs and discussed the procedure including the risks, benefits and alternatives for the proposed anesthesia with the patient or authorized representative who has indicated his/her understanding and acceptance.   Dental advisory given  Plan Discussed with: CRNA and Anesthesiologist  Anesthesia Plan Comments:         Anesthesia Quick Evaluation

## 2017-10-17 ENCOUNTER — Ambulatory Visit (HOSPITAL_BASED_OUTPATIENT_CLINIC_OR_DEPARTMENT_OTHER)
Admission: RE | Admit: 2017-10-17 | Discharge: 2017-10-17 | Disposition: A | Discharge status: Home / Self Care | Payer: 59 | Source: Ambulatory Visit | Attending: Gynecology | Admitting: Gynecology

## 2017-10-17 ENCOUNTER — Encounter (HOSPITAL_BASED_OUTPATIENT_CLINIC_OR_DEPARTMENT_OTHER): Payer: Self-pay

## 2017-10-17 ENCOUNTER — Encounter (HOSPITAL_BASED_OUTPATIENT_CLINIC_OR_DEPARTMENT_OTHER)
Admission: RE | Disposition: A | Discharge status: Home / Self Care | Payer: Self-pay | Source: Ambulatory Visit | Attending: Gynecology

## 2017-10-17 ENCOUNTER — Ambulatory Visit (HOSPITAL_BASED_OUTPATIENT_CLINIC_OR_DEPARTMENT_OTHER): Payer: 59 | Admitting: Anesthesiology

## 2017-10-17 DIAGNOSIS — Z7902 Long term (current) use of antithrombotics/antiplatelets: Secondary | ICD-10-CM | POA: Insufficient documentation

## 2017-10-17 DIAGNOSIS — D252 Subserosal leiomyoma of uterus: Secondary | ICD-10-CM

## 2017-10-17 DIAGNOSIS — D251 Intramural leiomyoma of uterus: Secondary | ICD-10-CM

## 2017-10-17 DIAGNOSIS — J45909 Unspecified asthma, uncomplicated: Secondary | ICD-10-CM | POA: Insufficient documentation

## 2017-10-17 DIAGNOSIS — Z6841 Body Mass Index (BMI) 40.0 and over, adult: Secondary | ICD-10-CM | POA: Insufficient documentation

## 2017-10-17 DIAGNOSIS — F419 Anxiety disorder, unspecified: Secondary | ICD-10-CM | POA: Insufficient documentation

## 2017-10-17 DIAGNOSIS — N950 Postmenopausal bleeding: Secondary | ICD-10-CM | POA: Insufficient documentation

## 2017-10-17 DIAGNOSIS — Z7982 Long term (current) use of aspirin: Secondary | ICD-10-CM | POA: Insufficient documentation

## 2017-10-17 DIAGNOSIS — Z86711 Personal history of pulmonary embolism: Secondary | ICD-10-CM | POA: Insufficient documentation

## 2017-10-17 DIAGNOSIS — R7303 Prediabetes: Secondary | ICD-10-CM | POA: Insufficient documentation

## 2017-10-17 DIAGNOSIS — D250 Submucous leiomyoma of uterus: Secondary | ICD-10-CM

## 2017-10-17 DIAGNOSIS — Z79899 Other long term (current) drug therapy: Secondary | ICD-10-CM | POA: Insufficient documentation

## 2017-10-17 DIAGNOSIS — K219 Gastro-esophageal reflux disease without esophagitis: Secondary | ICD-10-CM | POA: Insufficient documentation

## 2017-10-17 DIAGNOSIS — M797 Fibromyalgia: Secondary | ICD-10-CM | POA: Insufficient documentation

## 2017-10-17 HISTORY — DX: Prediabetes: R73.03

## 2017-10-17 HISTORY — DX: Long term (current) use of anticoagulants: Z79.01

## 2017-10-17 HISTORY — DX: Mild intermittent asthma, uncomplicated: J45.20

## 2017-10-17 HISTORY — DX: Other specified disorders of bladder: N32.89

## 2017-10-17 HISTORY — DX: Urgency of urination: R39.15

## 2017-10-17 HISTORY — DX: Vitamin D deficiency, unspecified: E55.9

## 2017-10-17 HISTORY — PX: DILATATION & CURETTAGE/HYSTEROSCOPY WITH MYOSURE: SHX6511

## 2017-10-17 HISTORY — DX: Personal history of pulmonary embolism: Z86.711

## 2017-10-17 HISTORY — DX: Iron deficiency anemia, unspecified: D50.9

## 2017-10-17 HISTORY — DX: Presence of spectacles and contact lenses: Z97.3

## 2017-10-17 HISTORY — DX: Gastro-esophageal reflux disease without esophagitis: K21.9

## 2017-10-17 HISTORY — DX: Leiomyoma of uterus, unspecified: D25.9

## 2017-10-17 HISTORY — DX: Excessive and frequent menstruation with regular cycle: N92.0

## 2017-10-17 HISTORY — DX: Personal history of other diseases of the respiratory system: Z87.09

## 2017-10-17 HISTORY — DX: Unspecified open-angle glaucoma, stage unspecified: H40.10X0

## 2017-10-17 LAB — GLUCOSE, CAPILLARY: GLUCOSE-CAPILLARY: 100 mg/dL — AB (ref 65–99)

## 2017-10-17 SURGERY — DILATATION & CURETTAGE/HYSTEROSCOPY WITH MYOSURE
Anesthesia: General | Site: Vagina

## 2017-10-17 MED ORDER — EPHEDRINE 5 MG/ML INJ
INTRAVENOUS | Status: AC
  Filled 2017-10-17: qty 10

## 2017-10-17 MED ORDER — MIDAZOLAM HCL 2 MG/2ML IJ SOLN
INTRAMUSCULAR | Status: AC
  Filled 2017-10-17: qty 2

## 2017-10-17 MED ORDER — SODIUM CHLORIDE 0.9 % IV SOLN
INTRAVENOUS | Status: AC
  Filled 2017-10-17: qty 2

## 2017-10-17 MED ORDER — OXYCODONE HCL 5 MG PO TABS
5.0000 mg | ORAL_TABLET | Freq: Once | ORAL | Status: DC | PRN
  Filled 2017-10-17: qty 1

## 2017-10-17 MED ORDER — LIDOCAINE 2% (20 MG/ML) 5 ML SYRINGE
INTRAMUSCULAR | Status: AC
  Filled 2017-10-17: qty 5

## 2017-10-17 MED ORDER — FENTANYL CITRATE (PF) 100 MCG/2ML IJ SOLN
INTRAMUSCULAR | Status: AC
  Filled 2017-10-17: qty 2

## 2017-10-17 MED ORDER — PROPOFOL 10 MG/ML IV BOLUS
INTRAVENOUS | Status: DC | PRN
  Administered 2017-10-17: 20 mg via INTRAVENOUS
  Administered 2017-10-17: 200 mg via INTRAVENOUS

## 2017-10-17 MED ORDER — SODIUM CHLORIDE 0.9 % IV SOLN
2.0000 g | INTRAVENOUS | Status: AC
  Administered 2017-10-17: 2 g via INTRAVENOUS
  Filled 2017-10-17: qty 2

## 2017-10-17 MED ORDER — LIDOCAINE HCL 1 % IJ SOLN
INTRAMUSCULAR | Status: DC | PRN
  Administered 2017-10-17: 10 mL

## 2017-10-17 MED ORDER — DEXAMETHASONE SODIUM PHOSPHATE 4 MG/ML IJ SOLN
INTRAMUSCULAR | Status: DC | PRN
  Administered 2017-10-17: 10 mg via INTRAVENOUS

## 2017-10-17 MED ORDER — FENTANYL CITRATE (PF) 100 MCG/2ML IJ SOLN
INTRAMUSCULAR | Status: DC | PRN
  Administered 2017-10-17 (×2): 50 ug via INTRAVENOUS

## 2017-10-17 MED ORDER — KETOROLAC TROMETHAMINE 30 MG/ML IJ SOLN
INTRAMUSCULAR | Status: AC
  Filled 2017-10-17: qty 1

## 2017-10-17 MED ORDER — PROMETHAZINE HCL 25 MG/ML IJ SOLN
6.2500 mg | INTRAMUSCULAR | Status: DC | PRN
Start: 2017-10-17 — End: 2017-10-17
  Filled 2017-10-17: qty 1

## 2017-10-17 MED ORDER — ONDANSETRON HCL 4 MG/2ML IJ SOLN
INTRAMUSCULAR | Status: AC
  Filled 2017-10-17: qty 2

## 2017-10-17 MED ORDER — ONDANSETRON HCL 4 MG/2ML IJ SOLN
INTRAMUSCULAR | Status: DC | PRN
  Administered 2017-10-17: 4 mg via INTRAVENOUS

## 2017-10-17 MED ORDER — EPHEDRINE SULFATE-NACL 50-0.9 MG/10ML-% IV SOSY
PREFILLED_SYRINGE | INTRAVENOUS | Status: DC | PRN
  Administered 2017-10-17: 15 mg via INTRAVENOUS

## 2017-10-17 MED ORDER — FENTANYL CITRATE (PF) 100 MCG/2ML IJ SOLN
25.0000 ug | INTRAMUSCULAR | Status: DC | PRN
  Filled 2017-10-17: qty 1

## 2017-10-17 MED ORDER — PROPOFOL 10 MG/ML IV BOLUS
INTRAVENOUS | Status: AC
Start: 2017-10-17 — End: ?
  Filled 2017-10-17: qty 40

## 2017-10-17 MED ORDER — ARTIFICIAL TEARS OPHTHALMIC OINT
TOPICAL_OINTMENT | OPHTHALMIC | Status: AC
  Filled 2017-10-17: qty 3.5

## 2017-10-17 MED ORDER — SODIUM CHLORIDE 0.9 % IR SOLN
Status: DC | PRN
  Administered 2017-10-17: 3000 mL

## 2017-10-17 MED ORDER — MIDAZOLAM HCL 5 MG/5ML IJ SOLN
INTRAMUSCULAR | Status: DC | PRN
  Administered 2017-10-17: 2 mg via INTRAVENOUS

## 2017-10-17 MED ORDER — LACTATED RINGERS IV SOLN
INTRAVENOUS | Status: DC
  Administered 2017-10-17: 06:00:00 via INTRAVENOUS
  Administered 2017-10-17: 1000 mL via INTRAVENOUS
  Filled 2017-10-17: qty 1000

## 2017-10-17 MED ORDER — DEXAMETHASONE SODIUM PHOSPHATE 10 MG/ML IJ SOLN
INTRAMUSCULAR | Status: AC
  Filled 2017-10-17: qty 1

## 2017-10-17 MED ORDER — OXYCODONE-ACETAMINOPHEN 5-325 MG PO TABS: 1.0000 | ORAL_TABLET | ORAL | 0 refills | Status: DC | PRN

## 2017-10-17 MED ORDER — OXYCODONE HCL 5 MG/5ML PO SOLN
5.0000 mg | Freq: Once | ORAL | Status: DC | PRN
  Filled 2017-10-17: qty 5

## 2017-10-17 MED ORDER — LIDOCAINE 2% (20 MG/ML) 5 ML SYRINGE
INTRAMUSCULAR | Status: DC | PRN
  Administered 2017-10-17: 100 mg via INTRAVENOUS

## 2017-10-17 SURGICAL SUPPLY — 29 items
CANISTER SUCT 3000ML PPV (MISCELLANEOUS) ×3 IMPLANT
CATH ROBINSON RED A/P 16FR (CATHETERS) ×3 IMPLANT
CLOTH BEACON ORANGE TIMEOUT ST (SAFETY) ×3 IMPLANT
COUNTER NEEDLE 1200 MAGNETIC (NEEDLE) ×3 IMPLANT
DEVICE MYOSURE LITE (MISCELLANEOUS) IMPLANT
DEVICE MYOSURE REACH (MISCELLANEOUS) IMPLANT
DILATOR CANAL MILEX (MISCELLANEOUS) IMPLANT
FILTER ARTHROSCOPY CONVERTOR (FILTER) ×3 IMPLANT
GLOVE BIO SURGEON STRL SZ7 (GLOVE) ×1 IMPLANT
GLOVE BIO SURGEON STRL SZ7.5 (GLOVE) ×6 IMPLANT
GLOVE BIOGEL PI IND STRL 7.0 (GLOVE) IMPLANT
GLOVE BIOGEL PI IND STRL 7.5 (GLOVE) IMPLANT
GLOVE BIOGEL PI INDICATOR 7.0 (GLOVE) ×1
GLOVE BIOGEL PI INDICATOR 7.5 (GLOVE) ×1
GOWN STRL REUS W/TWL LRG LVL3 (GOWN DISPOSABLE) ×4 IMPLANT
IV NS IRRIG 3000ML ARTHROMATIC (IV SOLUTION) ×3 IMPLANT
KIT TURNOVER CYSTO (KITS) ×3 IMPLANT
MYOSURE XL FIBROID REM (MISCELLANEOUS) ×2
NDL SAFETY ECLIPSE 18X1.5 (NEEDLE) IMPLANT
NEEDLE HYPO 18GX1.5 SHARP (NEEDLE)
PACK VAGINAL MINOR WOMEN LF (CUSTOM PROCEDURE TRAY) ×3 IMPLANT
PAD OB MATERNITY 4.3X12.25 (PERSONAL CARE ITEMS) ×3 IMPLANT
SEAL ROD LENS SCOPE MYOSURE (ABLATOR) ×3 IMPLANT
SYRINGE LUER LOK 1CC (MISCELLANEOUS) IMPLANT
SYSTEM TISS REMOVAL MYSR XL RM (MISCELLANEOUS) IMPLANT
TOWEL OR 17X24 6PK STRL BLUE (TOWEL DISPOSABLE) ×6 IMPLANT
TUBING AQUILEX INFLOW (TUBING) ×3 IMPLANT
TUBING AQUILEX OUTFLOW (TUBING) ×3 IMPLANT
WATER STERILE IRR 500ML POUR (IV SOLUTION) IMPLANT

## 2017-10-17 NOTE — Op Note (Signed)
Holly Mcdonald 14-Feb-1976 [id number]   Post Operative Note   Date of surgery:  10/17/2017  Pre Op Dx: Leiomyoma, menorrhagia  Post Op Dx: Leiomyoma, menorrhagia  Procedure: Hysteroscopy D&C with MyoSure resection submucous myoma  Surgeon:  Belinda Block Tokiko Diefenderfer  Anesthesia:  General  EBL: 50 cc  Distended media discrepancy: 3000 cc saline machine reported with large spillage noted on the floor  Complications:  None  Specimen: #1 endometrial curetting #2 submucous myoma fragments to pathology  Findings: EUA: External BUS vagina normal.  Cervix normal.  Uterus bulky, irregular consistent with history of leiomyoma   Hysteroscopy: Adequate noting fundus, anterior/posterior endometrial surfaces, right/left tubal ostia, lower uterine segment and endocervical canal all visualized.  Large submucous myoma extending from the right fundal region resected approximately 90%.  Procedure:  The patient was taken to the operating room, was placed in the low dorsal lithotomy position, underwent general anesthesia, received a perineal/vaginal preparation per nursing personnel.  The patient voided immediately before entering the operating room and a Foley catheterization was not performed.. The timeout was performed by the surgical team. An EUA was performed. The patient was draped in the usual fashion. The cervix was visualized with a speculum, anterior lip grasped with a single-tooth tenaculum and a paracervical block was placed using 10 cc's of 1% lidocaine. The cervix was gently dilated to admit the Myosure XL hysteroscope and hysteroscopy was performed with findings noted above. Using the Myosure XL resectoscopic wand the submucous myoma was resected to the level the surrounding endometrium.  The endometrial pressure was decreased to allow more of the myoma to extrude into the cavity with sequential resections performed.  Approximately 10% of the myoma remained at the end of the procedure.  A  gentle sharp curettage was performed. Both specimens were sent separately to pathology.  Repeat hysteroscopy showed an empty cavity with good distention and no evidence of perforation.  The instruments were removed and adequate hemostasis was visualized at the tenaculum site and external cervical os.  The specimens were identified for pathology.  The sponge, needle and instrument count were verified correct.  The patient was awakened without difficulty and was taken to the recovery room in good condition having tolerated the procedure well.  3000 cc distended media discrepancy was reported on the machine which was monitored throughout the procedure and of note a large amount of spillage on the floor accounted for this discrepancy.        Anastasio Auerbach MD, 8:30 AM 10/17/2017

## 2017-10-17 NOTE — H&P (Signed)
  The patient was examined.  I reviewed the proposed surgery and consent form with the patient.  The dictated history and physical is current and accurate and all questions were answered. The patient is ready to proceed with surgery and has a realistic understanding and expectation for the outcome.   Anastasio Auerbach MD, 7:20 AM 10/17/2017

## 2017-10-17 NOTE — Anesthesia Postprocedure Evaluation (Deleted)
Anesthesia Post Note  Patient: Holly Mcdonald  Procedure(s) Performed: DILATATION & CURETTAGE/HYSTEROSCOPY WITH MYOSURE (N/A Vagina )     Anesthesia Post Evaluation  Last Vitals:  Vitals:   10/17/17 0853 10/17/17 0900  BP:  117/71  Pulse: (!) 101 99  Resp: 18 (!) 26  Temp:    SpO2: 100% 100%    Last Pain:  Vitals:   10/17/17 0900  TempSrc:   PainSc: 0-No pain                 Audry Pili

## 2017-10-17 NOTE — Anesthesia Postprocedure Evaluation (Signed)
Anesthesia Post Note  Patient: [name]  Procedure(s) Performed: DILATATION & CURETTAGE/HYSTEROSCOPY WITH MYOSURE (N/A Vagina )     Patient location during evaluation: PACU Anesthesia Type: General Level of consciousness: awake and alert Pain management: pain level controlled Vital Signs Assessment: post-procedure vital signs reviewed and stable Respiratory status: spontaneous breathing, nonlabored ventilation and respiratory function stable Cardiovascular status: blood pressure returned to baseline and stable Postop Assessment: no apparent nausea or vomiting Anesthetic complications: no    Last Vitals:  Vitals:   10/17/17 0853 10/17/17 0900  BP:  117/71  Pulse: (!) 101 99  Resp: 18 (!) 26  Temp:    SpO2: 100% 100%    Last Pain:  Vitals:   10/17/17 0900  TempSrc:   PainSc: 0-No pain                 Audry Pili

## 2017-10-17 NOTE — Anesthesia Procedure Notes (Signed)
Procedure Name: LMA Insertion Date/Time: 10/17/2017 7:37 AM Performed by: Audry Pili, MD Pre-anesthesia Checklist: Patient identified, Emergency Drugs available, Suction available and Patient being monitored Patient Re-evaluated:Patient Re-evaluated prior to induction Oxygen Delivery Method: Circle system utilized Preoxygenation: Pre-oxygenation with 100% oxygen Induction Type: IV induction Ventilation: Mask ventilation without difficulty LMA: LMA inserted LMA Size: 4.0 Number of attempts: 1 Airway Equipment and Method: Bite block Placement Confirmation: positive ETCO2 Tube secured with: Tape Dental Injury: Teeth and Oropharynx as per pre-operative assessment

## 2017-10-17 NOTE — Discharge Instructions (Signed)
° °  Postoperative Instructions Hysteroscopy D & C ° °Dr. Fontaine and the nursing staff have discussed postoperative instructions with you.  If you have any questions please ask them before you leave the hospital, or call Dr Fontaine’s office at [phone].   ° °We would like to emphasize the following instructions: ° ° °? Call the office to make your follow-up appointment as recommended by Dr Fontaine (usually 1-2 weeks). ° °? You were given a prescription, or one was ordered for you at the pharmacy you designated.  Get that prescription filled and take the medication according to instructions. ° °? You may eat a regular diet, but slowly until you start having bowel movements. ° °? Drink plenty of water daily. ° °? Nothing in the vagina (intercourse, douching, objects of any kind) for two weeks.  When reinitiating intercourse, if it is uncomfortable, stop and make an appointment with Dr Fontaine to be evaluated. ° °? No driving for one to two days until the effects of anesthesia has worn off.  No traveling out of town for several days. ° °? You may shower, but no baths for one week.  Walking up and down stairs is ok.  No heavy lifting, prolonged standing, repeated bending or any “working out” until your post op check. ° °? Rest frequently, listen to your body and do not push yourself and overdo it. ° °? Call if: ° °o Your pain medication does not seem strong enough. °o Worsening pain or abdominal bloating °o Persistent nausea or vomiting °o Difficulty with urination or bowel movements. °o Temperature of 101 degrees or higher. °o Heavy vaginal bleeding.  If your period is due, you may use tampons. °o You have any questions or concerns ° ° °Post Anesthesia Home Care Instructions ° °Activity: °Get plenty of rest for the remainder of the day. A responsible individual must stay with you for 24 hours following the procedure.  °For the next 24 hours, DO NOT: °-Drive a car °-Operate machinery °-Drink alcoholic  beverages °-Take any medication unless instructed by your physician °-Make any legal decisions or sign important papers. ° °Meals: °Start with liquid foods such as gelatin or soup. Progress to regular foods as tolerated. Avoid greasy, spicy, heavy foods. If nausea and/or vomiting occur, drink only clear liquids until the nausea and/or vomiting subsides. Call your physician if vomiting continues. ° °Special Instructions/Symptoms: °Your throat may feel dry or sore from the anesthesia or the breathing tube placed in your throat during surgery. If this causes discomfort, gargle with warm salt water. The discomfort should disappear within 24 hours. ° ° °

## 2017-10-17 NOTE — Telephone Encounter (Signed)
Ultimately but it takes a while.  I will show her a copy when she comes in for her postop check in 2 weeks.

## 2017-10-17 NOTE — Transfer of Care (Signed)
   Last Vitals:  Vitals Value Taken Time  BP    Temp    Pulse 119 10/17/2017  8:33 AM  Resp 11 10/17/2017  8:33 AM  SpO2 100 % 10/17/2017  8:33 AM  Vitals shown include unvalidated device data.  Last Pain:  Vitals:   10/17/17 0606  TempSrc:   PainSc: 5       Patients Stated Pain Goal: 5 (10/17/17 0606)  Immediate Anesthesia Transfer of Care Note  Patient: Holly Mcdonald  Procedure(s) Performed: Procedure(s) (LRB): DILATATION & CURETTAGE/HYSTEROSCOPY WITH MYOSURE (N/A)  Patient Location: PACU  Anesthesia Type: General  Level of Consciousness: awake, alert  and oriented  Airway & Oxygen Therapy: Patient Spontanous Breathing and Patient connected to nasal cannula oxygen  Post-op Assessment: Report given to PACU RN and Post -op Vital signs reviewed and stable  Post vital signs: Reviewed and stable  Complications: No apparent anesthesia complications

## 2017-10-18 ENCOUNTER — Encounter (HOSPITAL_BASED_OUTPATIENT_CLINIC_OR_DEPARTMENT_OTHER): Payer: Self-pay | Admitting: Gynecology

## 2017-10-18 NOTE — Telephone Encounter (Signed)
Today (I told her mother that postop)

## 2017-10-18 NOTE — Telephone Encounter (Signed)
Your note mentions that the Dr. Who manages Eliquis said to restart 1-2 days post operatively. Just wanted to be sure that was still the plan.

## 2017-10-20 ENCOUNTER — Other Ambulatory Visit: Payer: Self-pay | Admitting: Family Medicine

## 2017-10-20 DIAGNOSIS — I2699 Other pulmonary embolism without acute cor pulmonale: Secondary | ICD-10-CM

## 2017-10-21 ENCOUNTER — Ambulatory Visit: Payer: 59 | Admitting: Gynecology

## 2017-10-25 NOTE — Telephone Encounter (Signed)
Although unlikely not impossible if the fibroids were enlarged enough to push on pelvic venous outflow to cause increased pressure in the lower extremities and lead to a DVT which leads to a PE.  So I think theoretically yes but practically probably not

## 2017-10-28 ENCOUNTER — Encounter: Payer: Self-pay | Admitting: Gynecology

## 2017-10-28 ENCOUNTER — Ambulatory Visit (INDEPENDENT_AMBULATORY_CARE_PROVIDER_SITE_OTHER): Payer: 59 | Admitting: Gynecology

## 2017-10-28 VITALS — BP 124/78

## 2017-10-28 DIAGNOSIS — Z09 Encounter for follow-up examination after completed treatment for conditions other than malignant neoplasm: Secondary | ICD-10-CM

## 2017-10-28 NOTE — Patient Instructions (Signed)
Office will contact you about initiating Depo-Lupron

## 2017-10-28 NOTE — Progress Notes (Signed)
    Helen-Marie Calinda Stockinger October 20, 1975 [id number]        42 y.o.  G0P0000 presents postoperative status post hysteroscopic resection submucous myoma.  Doing well without significant bleeding or pain.  Past medical history,surgical history, problem list, medications, allergies, family history and social history were all reviewed and documented in the EPIC chart.  Directed ROS with pertinent positives and negatives documented in the history of present illness/assessment and plan.  Exam: [name] assistant Vitals:   10/28/17 0956  BP: 124/78   General appearance:  Normal Abdomen soft nontender without gross masses Pelvic external BUS vagina normal.  Cervix normal.  Bimanual exam without gross masses or tenderness  Assessment/Plan:  42 y.o. G0P0000 with normal postoperative visit.  Pathology showed benign leiomyoma.  Reviewed pictures from surgery.  Patient and I discussed various options and at this point we will going to start on Depo-Lupron for at least 6 months for suppression of her other myomas and for bleeding suppression.  At that point will consider stopping it and seeing how she does.  We have discussed the issues of Depo-Lupron to include symptoms and accelerated bone loss.    Anastasio Auerbach MD, 10:18 AM 10/28/2017

## 2017-11-03 ENCOUNTER — Telehealth: Payer: Self-pay | Admitting: *Deleted

## 2017-11-03 NOTE — Telephone Encounter (Signed)
[facility] called and left message in triage voicemail wanting to confirm office address to ship patient Depo lupron. I called back and confirmed address the representative told me the order has been processed, but then the phone call dropped so we were discontinued.

## 2017-11-04 ENCOUNTER — Telehealth: Payer: Self-pay | Admitting: *Deleted

## 2017-11-04 NOTE — Telephone Encounter (Signed)
Patient left a message stating that she feels she is going into a fibromyalgia flare up.  She says she knows this because she is losing control of her body temperature.  She is asking for Dr. Ena Dawley advice on what to do.  Last time this happened she had to go to her PCP and receive a shot of pain medication and was out of work for 3 days.  She is asking for medical advice and a call back

## 2017-11-04 NOTE — Telephone Encounter (Signed)
She should continue to exercise and stretch as well as continue taking her medications as prescribed.  Often times stress in form of emotional stressors and/or physical (like her procedure) can cause flare ups.  Unfortunately, there are not any prophylactic abortive treatments that I am aware of.  Thanks.

## 2017-11-04 NOTE — Telephone Encounter (Signed)
Left message of Dr. Serita Grit response to her question. Ok to leave message on cell phone indicated on DPR

## 2017-11-08 ENCOUNTER — Ambulatory Visit (INDEPENDENT_AMBULATORY_CARE_PROVIDER_SITE_OTHER): Payer: 59 | Admitting: Gynecology

## 2017-11-08 ENCOUNTER — Encounter: Payer: Self-pay | Admitting: Gynecology

## 2017-11-08 DIAGNOSIS — D251 Intramural leiomyoma of uterus: Secondary | ICD-10-CM

## 2017-11-08 DIAGNOSIS — D252 Subserosal leiomyoma of uterus: Secondary | ICD-10-CM | POA: Diagnosis not present

## 2017-11-08 DIAGNOSIS — D250 Submucous leiomyoma of uterus: Secondary | ICD-10-CM

## 2017-11-08 MED ORDER — LEUPROLIDE ACETATE (3 MONTH) 11.25 MG IM KIT
11.2500 mg | PACK | Freq: Once | INTRAMUSCULAR | Status: AC
Start: 2017-11-08 — End: 2017-11-08
  Administered 2017-11-08: 11.25 mg via INTRAMUSCULAR

## 2017-11-09 ENCOUNTER — Encounter: Payer: Self-pay | Admitting: Gynecology

## 2017-11-09 NOTE — Telephone Encounter (Signed)
We should know by then

## 2017-11-11 ENCOUNTER — Ambulatory Visit (HOSPITAL_COMMUNITY)
Admission: RE | Admit: 2017-11-11 | Discharge: 2017-11-11 | Disposition: A | Discharge status: Home / Self Care | Payer: 59 | Source: Ambulatory Visit | Attending: Physical Medicine & Rehabilitation | Admitting: Physical Medicine & Rehabilitation

## 2017-11-11 DIAGNOSIS — M797 Fibromyalgia: Secondary | ICD-10-CM | POA: Insufficient documentation

## 2017-11-15 ENCOUNTER — Encounter: Payer: Self-pay | Admitting: Gynecology

## 2017-11-16 ENCOUNTER — Encounter: Payer: 59 | Attending: Physical Medicine & Rehabilitation | Admitting: Physical Medicine & Rehabilitation

## 2017-11-16 ENCOUNTER — Encounter: Payer: Self-pay | Admitting: Physical Medicine & Rehabilitation

## 2017-11-16 VITALS — BP 109/79 | HR 98 | Ht 70.0 in | Wt 310.0 lb

## 2017-11-16 DIAGNOSIS — H42 Glaucoma in diseases classified elsewhere: Secondary | ICD-10-CM | POA: Diagnosis not present

## 2017-11-16 DIAGNOSIS — G479 Sleep disorder, unspecified: Secondary | ICD-10-CM | POA: Insufficient documentation

## 2017-11-16 DIAGNOSIS — R269 Unspecified abnormalities of gait and mobility: Secondary | ICD-10-CM | POA: Insufficient documentation

## 2017-11-16 DIAGNOSIS — G43909 Migraine, unspecified, not intractable, without status migrainosus: Secondary | ICD-10-CM | POA: Diagnosis not present

## 2017-11-16 DIAGNOSIS — M797 Fibromyalgia: Secondary | ICD-10-CM | POA: Insufficient documentation

## 2017-11-16 DIAGNOSIS — M791 Myalgia, unspecified site: Secondary | ICD-10-CM

## 2017-11-16 DIAGNOSIS — Z6841 Body Mass Index (BMI) 40.0 and over, adult: Secondary | ICD-10-CM | POA: Diagnosis not present

## 2017-11-16 DIAGNOSIS — J449 Chronic obstructive pulmonary disease, unspecified: Secondary | ICD-10-CM | POA: Diagnosis not present

## 2017-11-16 DIAGNOSIS — I2699 Other pulmonary embolism without acute cor pulmonale: Secondary | ICD-10-CM

## 2017-11-16 DIAGNOSIS — D219 Benign neoplasm of connective and other soft tissue, unspecified: Secondary | ICD-10-CM | POA: Insufficient documentation

## 2017-11-16 DIAGNOSIS — F419 Anxiety disorder, unspecified: Secondary | ICD-10-CM | POA: Diagnosis not present

## 2017-11-16 DIAGNOSIS — R251 Tremor, unspecified: Secondary | ICD-10-CM | POA: Insufficient documentation

## 2017-11-16 DIAGNOSIS — E1139 Type 2 diabetes mellitus with other diabetic ophthalmic complication: Secondary | ICD-10-CM | POA: Diagnosis not present

## 2017-11-16 DIAGNOSIS — G894 Chronic pain syndrome: Secondary | ICD-10-CM | POA: Diagnosis present

## 2017-11-16 DIAGNOSIS — Z86711 Personal history of pulmonary embolism: Secondary | ICD-10-CM | POA: Insufficient documentation

## 2017-11-16 DIAGNOSIS — M545 Low back pain: Secondary | ICD-10-CM | POA: Diagnosis not present

## 2017-11-16 MED ORDER — GABAPENTIN 100 MG PO CAPS: ORAL_CAPSULE | ORAL | 1 refills | Status: DC

## 2017-11-16 NOTE — Progress Notes (Signed)
Subjective:    Patient ID: Holly Mcdonald, female    DOB: [birth year], 42 y.o.   MRN: [id number]  HPI 42 year old female with past medical history of tremors, pulmonary embolism, morbid obesity, migraines, fibromyalgia, prediabetes, asthma, fibroids, chronic bronchitis presents with fibromyalgia.  Initially stated: Started 10/2016 after diagnosed PE.  Progressively getting worse.  Heat improved the pain.  Fibroids have exacerbated the pain, with surgery planned for 10/17/17.  Activity exacerbates the pain.  All qualities of pain.  Non-radiating.  Constant.  Associated muscle spasms and tingling.  Tylenol does not help.  Cymbalta provides some benefit. Denies falls. Pain limits all activities.   Works as Therapist, art.    Last clinic visit 10/07/17.  Since that time, pt had procedure for leimyoma, notes reviewed.  Pt states she had fibroid removal, but still has 5 fibroids, for which she is getting treatment.  She obtained xray of C-spine. She never heard anything about TENS unit. She never tried OTC lidoderm patch. Gabapentin causes sedation, but helps with pain at night. Baclofen helps, but causes sedation as well.  Pain Inventory Average Pain 6 Pain Right Now 3 My pain is constant, dull, tingling and aching  In the last 24 hours, has pain interfered with the following? General activity 2 Relation with others 2 Enjoyment of life 2 What TIME of day is your pain at its worst? all Sleep (in general) Fair  Pain is worse with: walking, bending, sitting and some activites Pain improves with: rest, heat/ice, pacing activities and injections Relief from Meds: 10  Mobility walk without assistance how many minutes can you walk? 15 ability to climb steps?  yes do you drive?  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  no  Physicians involved in your care Primary care Dr. [name]   Family History  Problem Relation Age of Onset  . Pulmonary embolism Mother        Last year  . Mental retardation Mother        PTSD and fibromyalgia  . Heart disease Mother        CHF  . Congestive Heart Failure Mother   . Heart disease Father 41       CAD  . Hyperlipidemia Father   . Diabetes Paternal Grandmother   . Heart attack Paternal Grandmother   . Congestive Heart Failure Maternal Aunt   . Cancer Neg Hx    Social History   Socioeconomic History  . Marital status: Divorced    Spouse name: Not on file  . Number of children: Not on file  . Years of education: Not on file  . Highest education level: Not on file  Occupational History  . Occupation: representative    Comment: [employer]  Social Needs  . Financial resource strain: Not on file  . Food insecurity:    Worry: Not on file    Inability: Not on file  . Transportation needs:    Medical: Not on file    Non-medical: Not on file  Tobacco Use  . Smoking status: Never Smoker  . Smokeless tobacco: Never Used  Substance and Sexual Activity  . Alcohol use: Yes    Comment: occasionally  . Drug use: No  . Sexual activity: Yes    Birth control/protection: None    Comment: 1st intercourse 43 yo-More than 5  partners    Lifestyle  . Physical activity:    Days per week: Not on file    Minutes per session: Not on file  . Stress: Not on file  Relationships  . Social connections:    Talks on phone: Not on file    Gets together: Not on file    Attends religious service: Not on file    Active member of club or organization: Not on file    Attends meetings of clubs or organizations: Not on file    Relationship status: Not on file  Other Topics Concern  . Not on file  Social History Narrative  . Not on file   Past Surgical History:  Procedure Laterality Date  . ANAL EXAMINATION UNDER ANESTHESIA  age 56   w/ removal cyst  .  DILATATION & CURETTAGE/HYSTEROSCOPY WITH MYOSURE N/A 10/17/2017   Procedure: [place];  Surgeon: Anastasio Auerbach, MD;  Location: [facility];  Service: Gynecology;  Laterality: N/A;  . GYNECOLOGIC CRYOSURGERY  age 69s  . TONSILLECTOMY    . TRANSTHORACIC ECHOCARDIOGRAM  01/17/2017   ef 60-65%/  mild TR   Past Medical History:  Diagnosis Date  . Anticoagulated    eliquis  . Bladder spasm   . Fibromyalgia   . GERD (gastroesophageal reflux disease)   . Heavy menstrual bleeding   . History of chronic bronchitis   . History of pulmonary embolism 10/2016--- followed by pcp and pulmoloigst   dx bilateral PE , bilateral lower lobes , segmental , unprovoked, negative hypercoagulable work-up-- treated w/ eliquis  . Iron deficiency anemia   . Migraine   . Mild intermittent asthma    pulmologist-  dr Elsworth Soho  . Morbid obesity with BMI of 45.0-49.9, adult ([place])   . Open-angle glaucoma of both eyes   . Pre-diabetes   . 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 Urgency of urination   . Uterine fibroid   . Vitamin D deficiency   . Wears glasses    BP 109/79   Pulse 98   Ht 5\' 10"  (1.778 m)   Wt (!) 310 lb (140.6 kg)   LMP  (LMP Unknown)   SpO2 96%   BMI 44.48 kg/m   Opioid Risk Score:   Fall Risk Score:  `1  Depression screen PHQ 2/9  Depression screen PHQ 2/9 10/07/2017  Decreased Interest 0  Down, Depressed, Hopeless 0  PHQ - 2 Score 0  Altered sleeping 1  Tired, decreased energy 3  Change in appetite 3  Feeling bad or failure about yourself  0  Trouble concentrating 0  Moving slowly or fidgety/restless 0  Suicidal thoughts 0  PHQ-9 Score 7  Difficult doing work/chores Somewhat difficult   Review of Systems  Constitutional: Negative.   HENT: Negative.   Eyes: Negative.   Respiratory: Negative.   Cardiovascular: Negative.   Gastrointestinal: Positive for abdominal pain.  Endocrine: Negative.    Genitourinary: Positive for difficulty urinating.  Musculoskeletal: Positive for arthralgias and myalgias.  Skin: Negative.   Allergic/Immunologic: Negative.   Neurological: Positive for tremors.  Hematological: Negative.   Psychiatric/Behavioral: The patient is nervous/anxious.   All other systems reviewed and are negative.     Objective:   Physical Exam Gen: Vital signs reviewed. NAD. HENT: Normocephalic, Atraumatic Eyes: EOMI. No discharge.  Cardio: RRR. No JVD. Pulm: B/l clear to auscultation.  Effort normal. Abd: Nondistended, BS+ MSK:  Gait antalgic   TTP  diffusely with minimal touch b/l UE and upper back.    No edema.  Neuro:  Strength  4/5 in all UE myotomes (pain inhibition)    Skin: Warm and Dry. Intact    Assessment & Plan:  42 year old female with past medical history of tremors, pulmonary embolism, morbid obesity, migraines, fibromyalgia, prediabetes, asthma, fibroids, chronic bronchitis presents with pain all over.   1. Chronic mechanical low back pain  Will avoid NSAIDs due to DOAC and PE, will reconsider when anticoag d/ced  Neck xray reviewed, showing mild disc narrowing at C7-T1.  Cont Heat, trial cold  Will order aquatic PT   Will await PT eval for TENS - no call back with previous order  Recommend Lidoderm OTC again  Cont Cymbalta 60mg  daily with food  Will trial Gabapentin 100 BID and 300 qhs - 300 causes sedation  Cont Baclofen 10 TID prn, trial 5 mg  Will consider referral to Psychology  Will consider accupuncture   2. Gait abnormality  Does not believe she needs cane at present  3. Sleep disturbance  Cont meds  4. Morbid Obesity  Cont to follow up with nutritionist  5. Myalgia   Will consider trigger point injections  See #1  6. Migraines  Cont Topamax

## 2017-11-17 ENCOUNTER — Telehealth: Payer: Self-pay | Admitting: *Deleted

## 2017-11-17 NOTE — Telephone Encounter (Signed)
Copied from [place] [phone]. Topic: Referral - Request >> Nov 17, 2017 11:55 AM Lennox Solders wrote: Reason for CRM: dr Idolina Primer phone [phone] ophthalmologist . Pt needs update referral pt has an appt on 11/30/17. Pt referral run out of 11-26-17

## 2017-11-17 NOTE — Telephone Encounter (Signed)
Copied from [place] [phone]. Topic: Referral - Request >> Nov 17, 2017 11:50 AM Lennox Solders wrote: Reason for CRM: pt is calling dr patel is referring [facility] to [facility]  for fibromyalgia and chronic pain is diagnosis. Pt will be have aquatic therapy. Their phone number [phone] and fax number  Pt has [facility] navigate plan . Pt needs a referral. Pt has an appt 11-28-17 for evaluation

## 2017-11-18 ENCOUNTER — Other Ambulatory Visit: Payer: Self-pay | Admitting: *Deleted

## 2017-11-18 ENCOUNTER — Ambulatory Visit (INDEPENDENT_AMBULATORY_CARE_PROVIDER_SITE_OTHER)
Admission: RE | Admit: 2017-11-18 | Discharge: 2017-11-18 | Disposition: A | Discharge status: Home / Self Care | Payer: 59 | Source: Ambulatory Visit | Attending: Pulmonary Disease | Admitting: Pulmonary Disease

## 2017-11-18 ENCOUNTER — Telehealth: Payer: Self-pay | Admitting: *Deleted

## 2017-11-18 ENCOUNTER — Encounter: Payer: Self-pay | Admitting: Pulmonary Disease

## 2017-11-18 ENCOUNTER — Ambulatory Visit (INDEPENDENT_AMBULATORY_CARE_PROVIDER_SITE_OTHER): Payer: 59 | Admitting: Pulmonary Disease

## 2017-11-18 VITALS — BP 126/90 | HR 122 | Temp 98.2°F | Ht 70.0 in | Wt 310.0 lb

## 2017-11-18 DIAGNOSIS — J45909 Unspecified asthma, uncomplicated: Secondary | ICD-10-CM

## 2017-11-18 DIAGNOSIS — Z0100 Encounter for examination of eyes and vision without abnormal findings: Secondary | ICD-10-CM

## 2017-11-18 LAB — POCT EXHALED NITRIC OXIDE: FENO LEVEL (PPB): 15

## 2017-11-18 MED ORDER — BENZONATATE 200 MG PO CAPS: 200.0000 mg | ORAL_CAPSULE | Freq: Three times a day (TID) | ORAL | 1 refills | Status: DC | PRN

## 2017-11-18 MED ORDER — AZITHROMYCIN 250 MG PO TABS: ORAL_TABLET | ORAL | 0 refills | Status: DC

## 2017-11-18 MED ORDER — PREDNISONE 10 MG PO TABS: ORAL_TABLET | ORAL | 0 refills | Status: DC

## 2017-11-18 NOTE — Patient Instructions (Signed)
I am sorry that you are having a flareup of your asthma, bronchitis We will get a chest x-ray today, Z-Pak Prednisone taper starting at 40 mg.  Reduce dose by 10 mg every 3 days.  Follow-up with Dr. Elsworth Soho

## 2017-11-18 NOTE — Telephone Encounter (Signed)
Referral placed for upcoming appt with Dr Elsworth Soho at Pulmonary on 12/21/17 Pt aware via voicemail that referral placed.  Nothing further needed.   Copied from [place] [phone]. Topic: Referral - Request >> Nov 18, 2017 12:16 PM Pricilla Handler wrote: Reason for CRM: Patient called stating that she needs a Referral for Pulmonology ASAP. Patient has a follow up appt on 12/21/2017 with Dr. Elsworth Soho, but the referral must be in or [facility] will not pay for it. Patient's current referral will expire at the end of the month. Please call patient once Referral has been placed with [facility].       Thank You!!!

## 2017-11-18 NOTE — Telephone Encounter (Signed)
Referral placed for Dr. Idolina Primer as requested.

## 2017-11-18 NOTE — Progress Notes (Signed)
Holly Mcdonald    [id number]    1976-02-26  Primary Care Physician:Jordan, Malka So, MD  Referring Physician: Martinique, Betty G, MD [address] [place], [place] [address]  Chief complaint:   Acute visit for dyspnea  HPI: 42 year old with history of asthma and unprovoked pulmonary embolism in 2018, hypercoagulable work-up negative Seen in the clinic today with 1 day of dyspnea, chest tightness, wheezing, cough with yellow-colored sputum.  She denies any fevers, chills.  History noted for PE in 2018. She was on Eliquis but was held in May 2019 due to retinal hemorrhages, menorrhagic bleeding. She is followed by Dr. Burr Medico, Hematology and is on ASA 81 mg.  She also follows up with Dr. Elsworth Soho and at the allergy center for mild intermittent asthma and is on Advair which she uses as needed.  Outpatient Encounter Medications as of 11/18/2017  Medication Sig  . ADVAIR DISKUS 250-50 MCG/DOSE AEPB INHALE 1 PUFF INTO THE LUNGS TWICE DAILY (Patient taking differently: INHALE 1 PUFF INTO THE LUNGS TWICE DAILY--- per pt as needed)  . albuterol (PROVENTIL HFA;VENTOLIN HFA) 108 (90 Base) MCG/ACT inhaler Inhale 1-2 puffs into the lungs every 6 (six) hours as needed for wheezing or shortness of breath.  Marland Kitchen albuterol (PROVENTIL) (2.5 MG/3ML) 0.083% nebulizer solution Take 3 mLs (2.5 mg total) by nebulization every 6 (six) hours as needed for wheezing or shortness of breath.  Marland Kitchen aspirin EC 81 MG tablet Take 81 mg by mouth daily.  . baclofen (LIORESAL) 10 MG tablet Take 1 tablet (10 mg total) by mouth 3 (three) times daily as needed for muscle spasms.  . Benzonatate (TESSALON PO) Take 1 capsule by mouth daily as needed (cough).   . cetirizine (ZYRTEC) 10 MG tablet Take 10 mg by mouth every morning.   . Cyanocobalamin (VITAMIN B 12 PO) Take 1,000 mg by mouth daily.   . DULoxetine (CYMBALTA) 60 MG capsule TAKE 1 CAPSULE BY MOUTH DAILY (Patient taking differently: TAKE 1 CAPSULE BY MOUTH  DAILY-- takes in pm)  . ELIQUIS 5 MG TABS tablet TAKE 1 TABLET(5 MG) BY MOUTH TWICE DAILY  . Ferrous Sulfate (IRON) 325 (65 Fe) MG TABS Take 3 tablets by mouth every morning.  . fluticasone (FLONASE) 50 MCG/ACT nasal spray SHAKE LIQUID AND USE 1 SPRAY IN EACH NOSTRIL DAILY (Patient taking differently: SHAKE LIQUID AND USE 1 SPRAY IN EACH NOSTRIL DAILY--  in am)  . gabapentin (NEURONTIN) 100 MG capsule Take 100mg  twice a day and 300mg  at night  . megestrol (MEGACE) 40 MG tablet Take 1 tablet (40 mg total) by mouth 2 (two) times daily. As needed for bleeding  . montelukast (SINGULAIR) 10 MG tablet Take 1 tablet (10 mg total) by mouth at bedtime.  Marland Kitchen omeprazole (PRILOSEC) 40 MG capsule TAKE 1 CAPSULE(40 MG) BY MOUTH DAILY (Patient taking differently: TAKE 1 CAPSULE(40 MG) BY MOUTH DAILY--- takes in am)  . oxybutynin (DITROPAN) 5 MG tablet Take 1 tablet by mouth 2 (two) times daily.  Marland Kitchen oxyCODONE-acetaminophen (PERCOCET/ROXICET) 5-325 MG tablet Take 1 tablet by mouth every 4 (four) hours as needed for severe pain.  Marland Kitchen timolol (TIMOPTIC) 0.5 % ophthalmic solution Place 1 drop into both eyes every morning.  . topiramate (TOPAMAX) 25 MG tablet TK 3 TS PO D----- per pt takes 3 tablets qhs  . Vitamin D, Ergocalciferol, (DRISDOL) 50000 units CAPS capsule TAKE 1 CAPSULE BY MOUTH WEEKLY FOR 8 WEEKS, THEN EVERY 2 WEEKS (Patient taking differently:  Take 50,000 Units by mouth every 7 (seven) days. TAKE 1 CAPSULE BY MOUTH WEEKLY FOR 8 WEEKS, THEN EVERY 2 WEEKS)   No facility-administered encounter medications on file as of 11/18/2017.     Allergies as of 11/18/2017 - Review Complete 11/18/2017  Allergen Reaction Noted  . Other Nausea And Vomiting     Past Medical History:  Diagnosis Date  . Anticoagulated    eliquis  . Bladder spasm   . Fibromyalgia   . GERD (gastroesophageal reflux disease)   . Heavy menstrual bleeding   . History of chronic bronchitis   . History of pulmonary embolism 10/2016---  followed by pcp and pulmoloigst   dx bilateral PE , bilateral lower lobes , segmental , unprovoked, negative hypercoagulable work-up-- treated w/ eliquis  . Iron deficiency anemia   . Migraine   . Mild intermittent asthma    pulmologist-  dr Elsworth Soho  . Morbid obesity with BMI of 45.0-49.9, adult ([place])   . Open-angle glaucoma of both eyes   . Pre-diabetes   . 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 Urgency of urination   . Uterine fibroid   . Vitamin D deficiency   . Wears glasses     Past Surgical History:  Procedure Laterality Date  . ANAL EXAMINATION UNDER ANESTHESIA  age 44   w/ removal cyst  . DILATATION & CURETTAGE/HYSTEROSCOPY WITH MYOSURE N/A 10/17/2017   Procedure: [place];  Surgeon: Anastasio Auerbach, MD;  Location: [facility];  Service: Gynecology;  Laterality: N/A;  . GYNECOLOGIC CRYOSURGERY  age 49s  . TONSILLECTOMY    . TRANSTHORACIC ECHOCARDIOGRAM  01/17/2017   ef 60-65%/  mild TR    Family History  Problem Relation Age of Onset  . Pulmonary embolism Mother        Last year  . Mental retardation Mother        PTSD and fibromyalgia  . Heart disease Mother        CHF  . Congestive Heart Failure Mother   . Heart disease Father 55       CAD  . Hyperlipidemia Father   . Diabetes Paternal Grandmother   . Heart attack Paternal Grandmother   . Congestive Heart Failure Maternal Aunt   . Cancer Neg Hx     Social History   Socioeconomic History  . Marital status: Divorced    Spouse name: Not on file  . Number of children: Not on file  . Years of education: Not on file  . Highest education level: Not on file  Occupational History  . Occupation: representative    Comment: [employer]  Social Needs  . Financial resource strain: Not on file  . Food insecurity:    Worry: Not on file    Inability: Not on file  . Transportation needs:    Medical: Not on file    Non-medical: Not on file    Tobacco Use  . Smoking status: Never Smoker  . Smokeless tobacco: Never Used  Substance and Sexual Activity  . Alcohol use: Yes    Comment: occasionally  . Drug use: No  . Sexual activity: Yes    Birth control/protection: None    Comment: 1st intercourse 42 yo-More than 5 partners    Lifestyle  . Physical activity:    Days per week: Not on file    Minutes per session: Not on file  . Stress: Not on file  Relationships  .  Social connections:    Talks on phone: Not on file    Gets together: Not on file    Attends religious service: Not on file    Active member of club or organization: Not on file    Attends meetings of clubs or organizations: Not on file    Relationship status: Not on file  . Intimate partner violence:    Fear of current or ex partner: Not on file    Emotionally abused: Not on file    Physically abused: Not on file    Forced sexual activity: Not on file  Other Topics Concern  . Not on file  Social History Narrative  . Not on file    Review of systems: Review of Systems  Constitutional: Negative for fever and chills.  HENT: Negative.   Eyes: Negative for blurred vision.  Respiratory: as per HPI  Cardiovascular: Negative for chest pain and palpitations.  Gastrointestinal: Negative for vomiting, diarrhea, blood per rectum. Genitourinary: Negative for dysuria, urgency, frequency and hematuria.  Musculoskeletal: Negative for myalgias, back pain and joint pain.  Skin: Negative for itching and rash.  Neurological: Negative for dizziness, tremors, focal weakness, seizures and loss of consciousness.  Endo/Heme/Allergies: Negative for environmental allergies.  Psychiatric/Behavioral: Negative for depression, suicidal ideas and hallucinations.  All other systems reviewed and are negative.  Physical Exam: Blood pressure 126/90, pulse (!) 122, temperature 98.2 F (36.8 C), temperature source Oral, height 5\' 10"  (1.778 m), weight (!) 310 lb (140.6 kg), SpO2 98  %. Gen:      No acute distress HEENT:  EOMI, sclera anicteric Neck:     No masses; no thyromegaly Lungs:    Clear to auscultation bilaterally; normal respiratory effort CV:         Regular rate and rhythm; no murmurs Abd:      + bowel sounds; soft, non-tender; no palpable masses, no distension Ext:    No edema; adequate peripheral perfusion Skin:      Warm and dry; no rash Neuro: alert and oriented x 3 Psych: normal mood and affect  Data Reviewed: Spirometry 06/13/2017 FVC 3.10 [82%], FEV1 2.64 [85%], F/F 85 Normal test  CT chest 11/03/2016- acute bilateral segmental pulmonary embolism with no right heart strain.  Small left effusion with left base atelectasis.   CT chest 11/26/2016- residual tiny subsegmental pulmonary embolism.  No acute pulmonary disease.  Resolution of left effusion in cases. I reviewed the images personally.  Assessment:  Mild intermittent asthma with exacerbation Seen in clinic today with mild exacerbation of asthma, bronchitis We will get a chest x-ray today for evaluation.  Prescribe Z-Pak, prednisone taper Advised to use the advair regularly until she can get over this episode. Continue albuterol as needed  History of pulmonary embolism Currently off anticoagulation due to retinal hemorrhage, menorrhagia Do not suspect recurrence of clot.  We will continue to monitor Continue aspirin as per hematology recommendations.  Plan/Recommendations: - CXR, Z pack, pred taper - Advair bid  Marshell Garfinkel MD [facility] 11/18/2017, 11:16 AM  CC: Martinique, Betty G, MD

## 2017-11-22 ENCOUNTER — Telehealth: Payer: Self-pay | Admitting: Pulmonary Disease

## 2017-11-22 NOTE — Telephone Encounter (Signed)
Called and spoke with patient regarding results.  Informed the patient of results and recommendations today. Pt verbalized understanding and denied any questions or concerns at this time.  Nothing further needed.  

## 2017-11-30 ENCOUNTER — Other Ambulatory Visit: Payer: Self-pay | Admitting: Physical Medicine & Rehabilitation

## 2017-12-01 ENCOUNTER — Other Ambulatory Visit: Payer: Self-pay | Admitting: *Deleted

## 2017-12-01 DIAGNOSIS — J309 Allergic rhinitis, unspecified: Secondary | ICD-10-CM

## 2017-12-01 DIAGNOSIS — H1010 Acute atopic conjunctivitis, unspecified eye: Secondary | ICD-10-CM

## 2017-12-01 MED ORDER — MONTELUKAST SODIUM 10 MG PO TABS: 10.0000 mg | ORAL_TABLET | Freq: Every day | ORAL | 0 refills | Status: DC

## 2017-12-12 NOTE — Progress Notes (Signed)
[facility]  [phone] [phone]  Clinic Follow Up Note   Patient Care Team: Martinique, Betty G, MD as PCP - General (Family Medicine) 12/15/2017   Referring physician: Martinique, Betty G, MD   CHIEF COMPLAINTS:  F/u IDA  HISTORY OF PRESENTING ILLNESS: 12/20/16 Holly Mcdonald 42 y.o. female is here because of her recent pulmonary embolism. She was referred by her primary care physician Dr. Martinique, she presents to my clinic by herself today.   She thought she had bronchitis which she has now for her birthday vacation on 6/15th. Then she developed chest pain in the id chest a few days later. It hurt her to move. She went back to work on 6/25th. She went to work the next day and she went to urgent care and saw an ER doctor. He did a EKG. She went to ED at [facility] and a CT was done. She was on a heparin drip in hospital. Now she is on Eliquis one twice a day. Since hospitalization she still has chest pain. The pain it reduced but increased due to bronchitis. She had steroid shot earlier this month and the pain had stopped from that. She can feel when her clot breaks. The pain is more of a 5/10 than the original 15/10 in June.   She is always SOB. She does not have much of  Cough.   She has bladder spasms and she is prediabetic and suspect glaucoma. Dr. Elsworth Soho tested her lung function which came back clear.   She has had hip pain since May and she has bilateral lower leg swelling that can become painful. She is in PT for her right hip pain. Her mother has had clots 1 year ago. And her father died of a heart attack. She has been overweight for a long time. She has heavy flow regular periods. She stopped taking birth control in 2017.   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  She was evaluate by pulmonologist Dr. Elsworth Soho on 12/10/2016, who pulmonary function test was normal, she did not feel that this was helpful.    CURRENT  THERAPY: aspirin 81mg  daily, ferrous sulfate 3 tablets daily   INTERVAL HISTORY:  Holly Mcdonald is here for a follow up. She is here alone. She had a fibroid removal in June and just stopped bleeding yesterday. She would like to have a hysterectomy and is currently seeking treatment for her other fibroids. She is no longer on Eliquis. She is now on aspirin. She states her retinal hemorrhage resolved.  She is on iron pills 3x daily and is tolerating well.   She follows up with pain management every month. She reports that her mother also had a PE and is on anti-coagulation.  MEDICAL HISTORY:  Past Medical History:  Diagnosis Date  . Anticoagulated    eliquis  . Bladder spasm   . Fibromyalgia   . GERD (gastroesophageal reflux disease)   . Heavy menstrual bleeding   . History of chronic bronchitis   . History of pulmonary embolism 10/2016--- followed by pcp and pulmoloigst   dx bilateral PE , bilateral lower lobes , segmental , unprovoked, negative hypercoagulable work-up-- treated w/ eliquis  . Iron deficiency anemia   . Migraine   . Mild intermittent asthma    pulmologist-  dr Elsworth Soho  . Morbid obesity with BMI of 45.0-49.9, adult ([place])   . Open-angle glaucoma of both eyes   . Pre-diabetes   .  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 Urgency of urination   . Uterine fibroid   . Vitamin D deficiency   . Wears glasses     SURGICAL HISTORY: Past Surgical History:  Procedure Laterality Date  . ANAL EXAMINATION UNDER ANESTHESIA  age 74   w/ removal cyst  . DILATATION & CURETTAGE/HYSTEROSCOPY WITH MYOSURE N/A 10/17/2017   Procedure: [place];  Surgeon: Anastasio Auerbach, MD;  Location: [facility];  Service: Gynecology;  Laterality: N/A;  . GYNECOLOGIC CRYOSURGERY  age 54s  . TONSILLECTOMY    . TRANSTHORACIC ECHOCARDIOGRAM  01/17/2017   ef 60-65%/  mild TR    SOCIAL HISTORY: Social  History   Socioeconomic History  . Marital status: Divorced    Spouse name: Not on file  . Number of children: Not on file  . Years of education: Not on file  . Highest education level: Not on file  Occupational History  . Occupation: representative    Comment: [employer]  Social Needs  . Financial resource strain: Not on file  . Food insecurity:    Worry: Not on file    Inability: Not on file  . Transportation needs:    Medical: Not on file    Non-medical: Not on file  Tobacco Use  . Smoking status: Never Smoker  . Smokeless tobacco: Never Used  Substance and Sexual Activity  . Alcohol use: Yes    Comment: occasionally  . Drug use: No  . Sexual activity: Yes    Birth control/protection: None    Comment: 1st intercourse 42 yo-More than 5 partners    Lifestyle  . Physical activity:    Days per week: Not on file    Minutes per session: Not on file  . Stress: Not on file  Relationships  . Social connections:    Talks on phone: Not on file    Gets together: Not on file    Attends religious service: Not on file    Active member of club or organization: Not on file    Attends meetings of clubs or organizations: Not on file    Relationship status: Not on file  . Intimate partner violence:    Fear of current or ex partner: Not on file    Emotionally abused: Not on file    Physically abused: Not on file    Forced sexual activity: Not on file  Other Topics Concern  . Not on file  Social History Narrative  . Not on file    FAMILY HISTORY: Family History  Problem Relation Age of Onset  . Pulmonary embolism Mother        Last year  . Mental retardation Mother        PTSD and fibromyalgia  . Heart disease Mother        CHF  . Congestive Heart Failure Mother   . Heart disease Father 48       CAD  . Hyperlipidemia Father   . Diabetes Paternal Grandmother   . Heart attack Paternal Grandmother   . Congestive Heart Failure Maternal Aunt   . Cancer Neg Hx     ALLERGIES:   is allergic to other.  MEDICATIONS:  Current Outpatient Medications  Medication Sig Dispense Refill  . ADVAIR DISKUS 250-50 MCG/DOSE AEPB INHALE 1 PUFF INTO THE LUNGS TWICE DAILY (Patient taking differently: INHALE 1 PUFF INTO THE LUNGS TWICE DAILY--- per pt as needed) 60 each 6  .  albuterol (PROVENTIL HFA;VENTOLIN HFA) 108 (90 Base) MCG/ACT inhaler Inhale 1-2 puffs into the lungs every 6 (six) hours as needed for wheezing or shortness of breath. 18 g 2  . albuterol (PROVENTIL) (2.5 MG/3ML) 0.083% nebulizer solution Take 3 mLs (2.5 mg total) by nebulization every 6 (six) hours as needed for wheezing or shortness of breath. 360 mL 5  . aspirin EC 81 MG tablet Take 81 mg by mouth daily.    Marland Kitchen azithromycin (ZITHROMAX) 250 MG tablet Take 2 tablets today, then 1 tablet daily until gone. 6 tablet 0  . baclofen (LIORESAL) 10 MG tablet TAKE 1 TABLET BY MOUTH THREE TIMES DAILY AS NEEDED FOR MUSCLE SPASMS 90 tablet 0  . Benzonatate (TESSALON PO) Take 1 capsule by mouth daily as needed (cough).     . benzonatate (TESSALON) 200 MG capsule Take 1 capsule (200 mg total) by mouth 3 (three) times daily as needed for cough. 30 capsule 1  . cetirizine (ZYRTEC) 10 MG tablet Take 10 mg by mouth every morning.     . Cyanocobalamin (VITAMIN B 12 PO) Take 1,000 mg by mouth daily.     . DULoxetine (CYMBALTA) 60 MG capsule TAKE 1 CAPSULE BY MOUTH DAILY (Patient taking differently: TAKE 1 CAPSULE BY MOUTH DAILY-- takes in pm) 90 capsule 1  . Ferrous Sulfate (IRON) 325 (65 Fe) MG TABS Take 3 tablets by mouth every morning.    . fluticasone (FLONASE) 50 MCG/ACT nasal spray SHAKE LIQUID AND USE 1 SPRAY IN EACH NOSTRIL DAILY (Patient taking differently: SHAKE LIQUID AND USE 1 SPRAY IN EACH NOSTRIL DAILY--  in am) 16 g 2  . gabapentin (NEURONTIN) 100 MG capsule Take 100mg  twice a day and 300mg  at night 150 capsule 1  . gabapentin (NEURONTIN) 300 MG capsule TAKE 1 CAPSULE(300 MG) BY MOUTH THREE TIMES DAILY 90 capsule 0  .  megestrol (MEGACE) 40 MG tablet Take 1 tablet (40 mg total) by mouth 2 (two) times daily. As needed for bleeding 60 tablet 1  . montelukast (SINGULAIR) 10 MG tablet Take 1 tablet (10 mg total) by mouth at bedtime. 30 tablet 0  . omeprazole (PRILOSEC) 40 MG capsule TAKE 1 CAPSULE(40 MG) BY MOUTH DAILY (Patient taking differently: TAKE 1 CAPSULE(40 MG) BY MOUTH DAILY--- takes in am) 30 capsule 0  . oxybutynin (DITROPAN) 5 MG tablet Take 1 tablet by mouth 2 (two) times daily.  11  . oxyCODONE-acetaminophen (PERCOCET/ROXICET) 5-325 MG tablet Take 1 tablet by mouth every 4 (four) hours as needed for severe pain. 10 tablet 0  . predniSONE (DELTASONE) 10 MG tablet Take prednisone 40mg  x 3 days, then 30mg  x 3 days, then 20mg  x 3 days, then 10mg  x 3 days, then STOP. 30 tablet 0  . timolol (TIMOPTIC) 0.5 % ophthalmic solution Place 1 drop into both eyes every morning.    . topiramate (TOPAMAX) 25 MG tablet TK 3 TS PO D----- per pt takes 3 tablets qhs  1  . Vitamin D, Ergocalciferol, (DRISDOL) 50000 units CAPS capsule TAKE 1 CAPSULE BY MOUTH WEEKLY FOR 8 WEEKS, THEN EVERY 2 WEEKS (Patient taking differently: Take 50,000 Units by mouth every 7 (seven) days. TAKE 1 CAPSULE BY MOUTH WEEKLY FOR 8 WEEKS, THEN EVERY 2 WEEKS) 12 capsule 0   No current facility-administered medications for this visit.     REVIEW OF SYSTEMS:   Constitutional: Denies fevers, chills or abnormal night sweats Eyes: Denies blurriness of vision, double vision or watery eyes Ears, nose, mouth, throat, and  face: Denies mucositis or sore throat Respiratory: No SOB or CP. (+) asthma/bronchitis  Cardiovascular: Denies palpitation, chest discomfort  Gastrointestinal:  Denies nausea, heartburn or change in bowel habits Skin: Denies abnormal skin rashes OB/GYNE: (+) fibroid removal in June, was bleeding continuously and stopped just yesterday  Lymphatics: Denies new lymphadenopathy or easy bruising Neurological:Denies numbness, tingling or new  weaknesses MSK: (+) fibromyalgia  Behavioral/Psych: Mood is stable, no new changes  All other systems were reviewed with the patient and are negative.  PHYSICAL EXAMINATION:  ECOG PERFORMANCE STATUS: 1 - Symptomatic but completely ambulatory  Vitals:   12/15/17 1105  BP: 120/82  Pulse: 80  Resp: 18  Temp: 98.6 F (37 C)  SpO2: 99%   Filed Weights   12/15/17 1105  Weight: (!) 312 lb 9.6 oz (141.8 kg)    GENERAL:alert, no distress and comfortable SKIN: skin color, texture, turgor are normal, no rashes or significant lesions EYES: normal, conjunctiva are pink and non-injected, sclera clear OROPHARYNX:no exudate, no erythema and lips, buccal mucosa, and tongue normal  NECK: supple, thyroid normal size, non-tender, without nodularity LYMPH:  no palpable lymphadenopathy in the cervical, axillary or inguinal LUNGS: clear to auscultation and percussion with normal breathing effort HEART: regular rate & rhythm and no murmurs and no lower extremity edema ABDOMEN:abdomen soft, non-tender and normal bowel sounds Musculoskeletal:no cyanosis of digits and no clubbing  PSYCH: alert & oriented x 3 with fluent speech NEURO: no focal motor/sensory deficits  LABORATORY DATA:  I have reviewed the data as listed CBC Latest Ref Rng & Units 12/15/2017 10/14/2017 09/15/2017  WBC 3.9 - 10.3 K/uL 7.1 5.9 8.2  Hemoglobin 11.6 - 15.9 g/dL 11.3(L) 11.0(L) 12.5  Hematocrit 34.8 - 46.6 % 35.2 33.3(L) 38.0  Platelets 145 - 400 K/uL 280 313 292    CMP Latest Ref Rng & Units 10/14/2017 09/15/2017 03/15/2017  Glucose 65 - 99 mg/dL 107(H) 107 110(H)  BUN 6 - 20 mg/dL 9 14 8   Creatinine 0.44 - 1.00 mg/dL 0.79 0.85 0.68  Sodium 135 - 145 mmol/L 140 140 138  Potassium 3.5 - 5.1 mmol/L 3.6 4.1 3.8  Chloride 101 - 111 mmol/L 113(H) 114(H) 103  CO2 22 - 32 mmol/L 20(L) 21(L) 26  Calcium 8.9 - 10.3 mg/dL 9.3 10.1 9.4  Total Protein 6.5 - 8.1 g/dL 7.0 7.3 -  Total Bilirubin 0.3 - 1.2 mg/dL 0.7 0.3 -  Alkaline Phos  38 - 126 U/L 49 58 -  AST 15 - 41 U/L 17 14 -  ALT 14 - 54 U/L 16 14 -     PROCEDURES  ECHO 11/04/16 Study Conclusions - Left ventricle: The cavity size was normal. Wall thickness was   increased in a pattern of mild LVH. Systolic function was normal.   The estimated ejection fraction was in the range of 55% to 60%. - Left atrium: The atrium was mildly dilated. Indications:      Pulmonary embolus 415.19.    RADIOGRAPHIC STUDIES: I have personally reviewed the radiological images as listed and agreed with the findings in the report. Dg Chest 2 View  Result Date: 11/18/2017 CLINICAL DATA:  Onset dyspnea and cough today. EXAM: CHEST - 2 VIEW COMPARISON:  PA and lateral chest 04/01/2017.  CT chest 11/03/2016. FINDINGS: The lungs are clear. Heart size is normal. No pneumothorax or pleural effusion. No bony abnormality. IMPRESSION: Negative chest. Electronically Signed   By: Inge Rise M.D.   On: 11/18/2017 11:56    ASSESSMENT & PLAN:  Holly Mcdonald is a 42 y.o. female who has a history of seasonal allergies, asthma, bronchitis, prediabetic    1. Unprovoked Pulmonary Embolism in 10/2016 -Due to her young age, unprovoked PE, I previously recommended continue anticoagulation indefinitely, if there is no contraindication.   -She previously had IUD placed, with progesterone only, she knows to avoid oral contraceptives, due to the increased risk of thrombosis. -I strongly encourage patient to lose weight, which is risk factor for thrombosis.  She has lost about 50 pounds in the past 9 months. -Unfortunately patient has developed bilateral retinal hemorrhage, which has impacted her vision.  This is resolved after we stopped her Eliquis  -She also has persistent vaginal bleeding for the past several months, and underwent fiber resection.  Her vaginal bleeding has finally stopped yesterday.   -She is now on Lupron for ovarian suppression, due to other fibroids.  Patient wants to  have a hysterectomy, her gynecologist wants to try other methods before hysterectomy.   -We will continue holding anticoagulation now due to her recent retinal and persistent vaginal bleeding. Will continue aspirin -Her hypercoagulopathy work up was negative when she was diagnosed with PE (before heparin started)   2. Iron deficient anemia -She has been slightly anemic since she started anticoagulation. This is likely secondary to Eliquis induced menorrhagia. -Previous Iron study showed low iron and ferritin, consistent with iron deficiency. -I suggest she take oral iron, with Valamin C to make sure her levels can become normal.  -I suggest colace as she may get constipated on oral iron -Her 04/04/17 iron study came back improved with Saturation at 14%, overall normal.  -I previously encouraged her to repeat labs ever 2-3 months  -She will continue oral iron, for at least another 3 months. If her period stops while on Mirena I suggest she can stop oral iron.  -If she proceeds to have heavy periods and her iron levels drop, she can see me for IV iron.  -I previously suggested that she holds her Eliquis for 2-3 days of the start of her period as to lessen her menorrhagia.  -She will conitnue to be followed by Dr. Martinique and follow up with me as needed.  -hg at 11.3 today  -Advised the patient to continue on 65 mg Iron supplements 3 tabs daily with vitamin C.  -Repeat iron level today remains to be low, likely due to her recent long-standing vaginal bleeding.  I recommend her to have IV iron, she agrees.  Potential side effects, especially allergy reaction, including anaphylactic reaction, were discussed with her, she voiced good understanding.   3. Anxiety -Will f/u with Dr. Martinique for any medication or other non medical therapies.   4. Fibromyalgia -she has aches and pains upon touch from chest, shoulder and axillary regions  -Her mother has  history of fibromyalgia  -I previously suggested  she follow up to Dr. Martinique and discuss about this.  -She was diagnosed with fibromyalgia and takes cymbalta for management   5. Dyspnea  -Likely the combination of bilateral PE, obesity, asthma and anxiety -Her pulmonary function test was unremarkable, she saturation very well on room air -I previously encouraged her to continue physical therapy, and try to lose weight.  6. Obesity -Advised the patient to continue exercise to aid in further weight loss to prevent any future blood clots  PLAN -She will continue aspirin. -iv feraheme weekly X2 in 2-4 weeks  -labs in 2 months -lab and f/u in 4 months  No problem-specific Assessment & Plan notes found for this encounter.    All questions were answered. The patient knows to call the clinic with any problems, questions or concerns. I spent 20 minutes counseling the patient face to face. The total time spent in the appointment was 25 minutes and more than 50% was on counseling.  Dierdre Searles Dweik am acting as scribe for Dr. Truitt Merle.  I have reviewed the above documentation for accuracy and completeness, and I agree with the above.     Truitt Merle, MD 12/15/2017

## 2017-12-15 ENCOUNTER — Inpatient Hospital Stay: Payer: 59

## 2017-12-15 ENCOUNTER — Telehealth: Payer: Self-pay

## 2017-12-15 ENCOUNTER — Inpatient Hospital Stay: Payer: 59 | Attending: Hematology | Admitting: Hematology

## 2017-12-15 ENCOUNTER — Encounter: Payer: Self-pay | Admitting: Hematology

## 2017-12-15 VITALS — BP 120/82 | HR 80 | Temp 98.6°F | Resp 18 | Ht 70.0 in | Wt 312.6 lb

## 2017-12-15 DIAGNOSIS — Z7982 Long term (current) use of aspirin: Secondary | ICD-10-CM | POA: Diagnosis not present

## 2017-12-15 DIAGNOSIS — E669 Obesity, unspecified: Secondary | ICD-10-CM | POA: Diagnosis not present

## 2017-12-15 DIAGNOSIS — D500 Iron deficiency anemia secondary to blood loss (chronic): Secondary | ICD-10-CM | POA: Diagnosis not present

## 2017-12-15 DIAGNOSIS — E559 Vitamin D deficiency, unspecified: Secondary | ICD-10-CM | POA: Insufficient documentation

## 2017-12-15 DIAGNOSIS — M25551 Pain in right hip: Secondary | ICD-10-CM

## 2017-12-15 DIAGNOSIS — N3289 Other specified disorders of bladder: Secondary | ICD-10-CM | POA: Diagnosis not present

## 2017-12-15 DIAGNOSIS — R079 Chest pain, unspecified: Secondary | ICD-10-CM

## 2017-12-15 DIAGNOSIS — Z79899 Other long term (current) drug therapy: Secondary | ICD-10-CM

## 2017-12-15 DIAGNOSIS — J42 Unspecified chronic bronchitis: Secondary | ICD-10-CM

## 2017-12-15 DIAGNOSIS — F419 Anxiety disorder, unspecified: Secondary | ICD-10-CM | POA: Diagnosis not present

## 2017-12-15 DIAGNOSIS — Z7901 Long term (current) use of anticoagulants: Secondary | ICD-10-CM | POA: Insufficient documentation

## 2017-12-15 DIAGNOSIS — M7989 Other specified soft tissue disorders: Secondary | ICD-10-CM | POA: Diagnosis not present

## 2017-12-15 DIAGNOSIS — Z86711 Personal history of pulmonary embolism: Secondary | ICD-10-CM

## 2017-12-15 DIAGNOSIS — K219 Gastro-esophageal reflux disease without esophagitis: Secondary | ICD-10-CM | POA: Insufficient documentation

## 2017-12-15 DIAGNOSIS — R0602 Shortness of breath: Secondary | ICD-10-CM

## 2017-12-15 DIAGNOSIS — N920 Excessive and frequent menstruation with regular cycle: Secondary | ICD-10-CM

## 2017-12-15 DIAGNOSIS — M797 Fibromyalgia: Secondary | ICD-10-CM | POA: Diagnosis not present

## 2017-12-15 DIAGNOSIS — Z86718 Personal history of other venous thrombosis and embolism: Secondary | ICD-10-CM

## 2017-12-15 LAB — CBC WITH DIFFERENTIAL/PLATELET
Basophils Absolute: 0 10*3/uL (ref 0.0–0.1)
Basophils Relative: 0 %
EOS ABS: 0.1 10*3/uL (ref 0.0–0.5)
EOS PCT: 1 %
HCT: 35.2 % (ref 34.8–46.6)
Hemoglobin: 11.3 g/dL — ABNORMAL LOW (ref 11.6–15.9)
LYMPHS ABS: 2.2 10*3/uL (ref 0.9–3.3)
LYMPHS PCT: 31 %
MCH: 27.5 pg (ref 25.1–34.0)
MCHC: 32.1 g/dL (ref 31.5–36.0)
MCV: 85.6 fL (ref 79.5–101.0)
MONO ABS: 0.5 10*3/uL (ref 0.1–0.9)
MONOS PCT: 7 %
Neutro Abs: 4.4 10*3/uL (ref 1.5–6.5)
Neutrophils Relative %: 61 %
PLATELETS: 280 10*3/uL (ref 145–400)
RBC: 4.11 MIL/uL (ref 3.70–5.45)
RDW: 13.1 % (ref 11.2–14.5)
WBC: 7.1 10*3/uL (ref 3.9–10.3)

## 2017-12-15 LAB — IRON AND TIBC
Iron: 33 ug/dL — ABNORMAL LOW (ref 41–142)
Saturation Ratios: 8 % — ABNORMAL LOW (ref 21–57)
TIBC: 394 ug/dL (ref 236–444)
UIBC: 361 ug/dL

## 2017-12-15 LAB — FERRITIN: Ferritin: 16 ng/mL (ref 11–307)

## 2017-12-15 NOTE — Telephone Encounter (Signed)
Printed avs and calender of upcoming appointment. Per 8/8 los 

## 2017-12-16 ENCOUNTER — Encounter: Payer: Self-pay | Admitting: Physical Medicine & Rehabilitation

## 2017-12-16 ENCOUNTER — Encounter: Payer: 59 | Attending: Physical Medicine & Rehabilitation | Admitting: Physical Medicine & Rehabilitation

## 2017-12-16 ENCOUNTER — Other Ambulatory Visit: Payer: Self-pay | Admitting: Hematology

## 2017-12-16 VITALS — BP 118/81 | HR 98 | Ht 70.0 in | Wt 312.0 lb

## 2017-12-16 DIAGNOSIS — M791 Myalgia, unspecified site: Secondary | ICD-10-CM

## 2017-12-16 DIAGNOSIS — E1139 Type 2 diabetes mellitus with other diabetic ophthalmic complication: Secondary | ICD-10-CM | POA: Insufficient documentation

## 2017-12-16 DIAGNOSIS — M545 Low back pain: Secondary | ICD-10-CM | POA: Insufficient documentation

## 2017-12-16 DIAGNOSIS — H42 Glaucoma in diseases classified elsewhere: Secondary | ICD-10-CM | POA: Diagnosis not present

## 2017-12-16 DIAGNOSIS — G894 Chronic pain syndrome: Secondary | ICD-10-CM | POA: Diagnosis not present

## 2017-12-16 DIAGNOSIS — G479 Sleep disorder, unspecified: Secondary | ICD-10-CM | POA: Insufficient documentation

## 2017-12-16 DIAGNOSIS — R269 Unspecified abnormalities of gait and mobility: Secondary | ICD-10-CM

## 2017-12-16 DIAGNOSIS — Z6841 Body Mass Index (BMI) 40.0 and over, adult: Secondary | ICD-10-CM | POA: Diagnosis not present

## 2017-12-16 DIAGNOSIS — J449 Chronic obstructive pulmonary disease, unspecified: Secondary | ICD-10-CM | POA: Insufficient documentation

## 2017-12-16 DIAGNOSIS — F419 Anxiety disorder, unspecified: Secondary | ICD-10-CM | POA: Diagnosis not present

## 2017-12-16 DIAGNOSIS — D219 Benign neoplasm of connective and other soft tissue, unspecified: Secondary | ICD-10-CM | POA: Insufficient documentation

## 2017-12-16 DIAGNOSIS — M797 Fibromyalgia: Secondary | ICD-10-CM | POA: Diagnosis not present

## 2017-12-16 DIAGNOSIS — Z86711 Personal history of pulmonary embolism: Secondary | ICD-10-CM | POA: Insufficient documentation

## 2017-12-16 DIAGNOSIS — G43909 Migraine, unspecified, not intractable, without status migrainosus: Secondary | ICD-10-CM | POA: Diagnosis not present

## 2017-12-16 DIAGNOSIS — R251 Tremor, unspecified: Secondary | ICD-10-CM | POA: Insufficient documentation

## 2017-12-16 MED ORDER — PREGABALIN 75 MG PO CAPS
75.0000 mg | ORAL_CAPSULE | Freq: Two times a day (BID) | ORAL | 1 refills | Status: DC
Start: 2017-12-16 — End: 2018-01-12

## 2017-12-16 NOTE — Progress Notes (Addendum)
Subjective:    Patient ID: Holly Mcdonald, female    DOB: [birth year], 42 y.o.   MRN: [id number]  HPI 42 year old female with past medical history of tremors, pulmonary embolism, morbid obesity, migraines, fibromyalgia, prediabetes, asthma, fibroids, chronic bronchitis presents with fibromyalgia.  Initially stated: Started 10/2016 after diagnosed PE.  Progressively getting worse.  Heat improved the pain.  Fibroids have exacerbated the pain, with surgery planned for 10/17/17.  Activity exacerbates the pain.  All qualities of pain.  Non-radiating.  Constant.  Associated muscle spasms and tingling.  Tylenol does not help.  Cymbalta provides some benefit. Denies falls. Pain limits all activities.   Works as Therapist, art.    Last clinic visit 11/16/17.  Since that time, pt states she is doing well with pool therapy 2/week. She has not tried TENS yet. She has not tried OTC Lidoderm yet. She is tolerating Cymbalta. She is taking Gabapentin PRN. She has not tried Baclofen 5mg . Denies falls.  Pain Inventory Average Pain 5 Pain Right Now 3 My pain is constant, sharp, dull, stabbing, tingling and aching  In the last 24 hours, has pain interfered with the following? General activity 0 Relation with others 0 Enjoyment of life 0 What TIME of day is your pain at its worst? all Sleep (in general) Good  Pain is worse with: walking, sitting, standing and some activites Pain improves with: rest, heat/ice, pacing activities and injections Relief from Meds: 2  Mobility walk without assistance how many minutes can you walk? 15 ability to climb steps?  yes do you drive?  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  no  Physicians involved in your care Primary care Dr. [name]   Family History  Problem  Relation Age of Onset  . Pulmonary embolism Mother        Last year  . Mental retardation Mother        PTSD and fibromyalgia  . Heart disease Mother        CHF  . Congestive Heart Failure Mother   . Heart disease Father 74       CAD  . Hyperlipidemia Father   . Diabetes Paternal Grandmother   . Heart attack Paternal Grandmother   . Congestive Heart Failure Maternal Aunt   . Cancer Neg Hx    Social History   Socioeconomic History  . Marital status: Divorced    Spouse name: Not on file  . Number of children: Not on file  . Years of education: Not on file  . Highest education level: Not on file  Occupational History  . Occupation: representative    Comment: [employer]  Social Needs  . Financial resource strain: Not on file  . Food insecurity:    Worry: Not on file    Inability: Not on file  . Transportation needs:    Medical: Not on file    Non-medical: Not on file  Tobacco Use  . Smoking status: Never Smoker  . Smokeless tobacco: Never Used  Substance and Sexual Activity  . Alcohol use: Yes    Comment: occasionally  . Drug use: No  . Sexual activity: Yes    Birth control/protection: None    Comment: 1st intercourse 42 yo-More than 5 partners    Lifestyle  . Physical activity:    Days per week: Not on file  Minutes per session: Not on file  . Stress: Not on file  Relationships  . Social connections:    Talks on phone: Not on file    Gets together: Not on file    Attends religious service: Not on file    Active member of club or organization: Not on file    Attends meetings of clubs or organizations: Not on file    Relationship status: Not on file  Other Topics Concern  . Not on file  Social History Narrative  . Not on file   Past Surgical History:  Procedure Laterality Date  . ANAL EXAMINATION UNDER ANESTHESIA  age 41   w/ removal cyst  . DILATATION & CURETTAGE/HYSTEROSCOPY WITH MYOSURE N/A 10/17/2017   Procedure: [place];  Surgeon: Anastasio Auerbach, MD;  Location: [facility];  Service: Gynecology;  Laterality: N/A;  . GYNECOLOGIC CRYOSURGERY  age 4s  . TONSILLECTOMY    . TRANSTHORACIC ECHOCARDIOGRAM  01/17/2017   ef 60-65%/  mild TR   Past Medical History:  Diagnosis Date  . Anticoagulated    eliquis  . Bladder spasm   . Fibromyalgia   . GERD (gastroesophageal reflux disease)   . Heavy menstrual bleeding   . History of chronic bronchitis   . History of pulmonary embolism 10/2016--- followed by pcp and pulmoloigst   dx bilateral PE , bilateral lower lobes , segmental , unprovoked, negative hypercoagulable work-up-- treated w/ eliquis  . Iron deficiency anemia   . Migraine   . Mild intermittent asthma    pulmologist-  dr Elsworth Soho  . Morbid obesity with BMI of 45.0-49.9, adult ([place])   . Open-angle glaucoma of both eyes   . Pre-diabetes   . 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 Urgency of urination   . Uterine fibroid   . Vitamin D deficiency   . Wears glasses    BP 118/81   Pulse 98   Ht 5\' 10"  (1.778 m)   Wt (!) 312 lb (141.5 kg)   LMP 12/13/2017 Comment: fibroids.  SpO2 97%   BMI 44.77 kg/m   Opioid Risk Score:   Fall Risk Score:  `1  Depression screen PHQ 2/9  Depression screen PHQ 2/9 10/07/2017  Decreased Interest 0  Down, Depressed, Hopeless 0  PHQ - 2 Score 0  Altered sleeping 1  Tired, decreased energy 3  Change in appetite 3  Feeling bad or failure about yourself  0  Trouble concentrating 0  Moving slowly or fidgety/restless 0  Suicidal thoughts 0  PHQ-9 Score 7  Difficult doing work/chores Somewhat difficult   Review of Systems  Constitutional: Negative.   HENT: Negative.   Eyes: Negative.   Respiratory: Negative.   Cardiovascular: Negative.   Gastrointestinal: Positive for abdominal pain.  Endocrine: Negative.   Genitourinary: Positive for difficulty urinating.  Musculoskeletal: Positive for arthralgias and  myalgias.  Skin: Negative.   Allergic/Immunologic: Negative.   Neurological: Positive for tremors and numbness.  Hematological: Negative.   Psychiatric/Behavioral: The patient is nervous/anxious.   All other systems reviewed and are negative.     Objective:   Physical Exam Gen: Vital signs reviewed. NAD. HENT: Normocephalic, Atraumatic Eyes: EOMI. No discharge.  Cardio: RRR. No JVD. Pulm: B/l clear to auscultation.  Effort normal. Abd: Nondistended, BS+ MSK:  Gait antalgic   TTP diffusely with minimal touch b/l UE and upper back, R>L (slightly improved).    No edema.  Neuro:  Strength  4/5 in all UE myotomes (pain inhibition) Skin: Warm and Dry. Intact. Tattoo on right arm.    Assessment & Plan:  42 year old female with past medical history of tremors, pulmonary embolism, morbid obesity, migraines, fibromyalgia, prediabetes, asthma, fibroids, chronic bronchitis presents with pain all over.   1. Chronic mechanical low back pain  Will avoid NSAIDs due to DOAC and PE, will reconsider when anticoag d/ced  Neck xray reviewed, showing mild disc narrowing at C7-T1.  Cont Heat, trial cold  Cont aquatic PT   Will await PT eval for TENS   Recommend Lidoderm OTC again (x2)  Cont Cymbalta 60mg  daily with food  Will change Gabapentin 100 BID and 300 qhs - 300 causes sedation, to Lyrica 75 BID  Cont Baclofen 10 TID prn, trial 5 mg  Will consider referral to Psychology  Will consider accupuncture   2. Gait abnormality  Does not believe she needs cane at present  3. Sleep disturbance  Cont meds  4. Morbid Obesity  Cont to follow up with nutritionist  5. Myalgia   Will consider trigger point injections when able to tolerate  See #1  6. Migraines  Cont Topamax

## 2017-12-20 ENCOUNTER — Telehealth: Payer: Self-pay | Admitting: Hematology

## 2017-12-20 ENCOUNTER — Telehealth: Payer: Self-pay | Admitting: *Deleted

## 2017-12-20 NOTE — Telephone Encounter (Signed)
Appointments scheduled and patient notified per 8/8 sch msg

## 2017-12-20 NOTE — Telephone Encounter (Signed)
Prior auth submitted to [facility] via Cover My Meds for Lyrica (pregabalin) 75 mg twice daily #60.

## 2017-12-21 ENCOUNTER — Ambulatory Visit (INDEPENDENT_AMBULATORY_CARE_PROVIDER_SITE_OTHER): Payer: 59 | Admitting: Pulmonary Disease

## 2017-12-21 ENCOUNTER — Encounter: Payer: Self-pay | Admitting: Pulmonary Disease

## 2017-12-21 ENCOUNTER — Telehealth: Payer: Self-pay | Admitting: Family Medicine

## 2017-12-21 DIAGNOSIS — I2699 Other pulmonary embolism without acute cor pulmonale: Secondary | ICD-10-CM

## 2017-12-21 DIAGNOSIS — J45909 Unspecified asthma, uncomplicated: Secondary | ICD-10-CM | POA: Diagnosis not present

## 2017-12-21 NOTE — Progress Notes (Signed)
   Subjective:    Patient ID: Holly Mcdonald, female    DOB: Jan 01, 1976, 42 y.o.   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 Unfortunately, she developed retinal hemorrhages and menorrhagia , Eliquis was stopped by hematology & she is on ASA  She had frequent attacks of bronchitis in 2018 with history of adult onset asthma for which she was maintained on Advair.   11/2017 acute OV for flare >> zpak + pred  She improved rather quickly and has not needed Advair.  She only uses albuterol about once a week.  Cough has subsided.  She underwent hysteroscopy in took Eliquis for about a month afterwards and is now back on aspirin alone    Significant tests/ events reviewed  6/2018CT angiogram of the chest showed acute segmental pulmonary emboli in bilateral lower lobes, no CT evidence of right heart strain. Doppler lower extremity was negative for DVT.  Spirometry 12/2016 >. FEV 1 90%  Review of Systems neg for any significant sore throat, dysphagia, itching, sneezing, nasal congestion or excess/ purulent secretions, fever, chills, sweats, unintended wt loss, pleuritic or exertional cp, hempoptysis, orthopnea pnd or change in chronic leg swelling. Also denies presyncope, palpitations, heartburn, abdominal pain, nausea, vomiting, diarrhea or change in bowel or urinary habits, dysuria,hematuria, rash, arthralgias, visual complaints, headache, numbness weakness or ataxia.     Objective:   Physical Exam   Gen. Pleasant, obese, in no distress ENT - no lesions, no post nasal drip Neck: No JVD, no thyromegaly, no carotid bruits Lungs: no use of accessory muscles, no dullness to percussion, decreased without rales or rhonchi  Cardiovascular: Rhythm regular, heart sounds  normal, no murmurs  or gallops, no peripheral edema Musculoskeletal: No deformities, no cyanosis or clubbing , no tremors Skin - large lung tattoo over rt arm 'survivor'        Assessment & Plan:

## 2017-12-21 NOTE — Telephone Encounter (Signed)
Copied from [place] [id number]. Topic: Referral - Request >> Dec 21, 2017 12:16 PM Margot Ables wrote: Reason for CRM: Pt has [facility] which requires referral and auth for specialist visits. Pt has appt 02/14/18 with Dr. Domingo Cocking. Please obtain new referral and auth. Notify pt when obtained.   [facility]:  Orie Rout MD Address: [address], [place], [place] [address] Phone: [phone]

## 2017-12-21 NOTE — Telephone Encounter (Signed)
Message sent to Dr. Jordan for review and approval. 

## 2017-12-21 NOTE — Assessment & Plan Note (Signed)
ASA only

## 2017-12-21 NOTE — Patient Instructions (Signed)
Continue on albuterol as needed

## 2017-12-21 NOTE — Assessment & Plan Note (Signed)
Mild intermittent. Continue albuterol alone. Okay to stop Advair  For bronchitic exacerbations, we discussed early antibiotic if persistent green-yellow sputum, try to avoid prednisone unless absolutely necessary

## 2017-12-23 NOTE — Telephone Encounter (Signed)
Is a referral necessary if the pt see neuro so consistently? Please advise

## 2017-12-23 NOTE — Telephone Encounter (Signed)
Referral done for the [facility] - 12/23/2017 Referral ID [id number]

## 2017-12-23 NOTE — Telephone Encounter (Signed)
We have placed about 4 referral to neurologist since 04/2017. Please ask Neoma Laming if she really needs another one, if she does it can be placed as requested per patient.  Thanks, BJ

## 2017-12-25 ENCOUNTER — Encounter: Payer: Self-pay | Admitting: Gynecology

## 2017-12-26 ENCOUNTER — Other Ambulatory Visit: Payer: Self-pay | Admitting: Gynecology

## 2017-12-26 DIAGNOSIS — D219 Benign neoplasm of connective and other soft tissue, unspecified: Secondary | ICD-10-CM

## 2017-12-26 NOTE — Telephone Encounter (Signed)
We should see maximum shrinkage around 12 weeks after first shot.  Therefore I would recommend ultrasound end of September/beginning of October

## 2017-12-27 ENCOUNTER — Other Ambulatory Visit: Payer: Self-pay | Admitting: *Deleted

## 2017-12-27 ENCOUNTER — Ambulatory Visit: Payer: 59

## 2017-12-27 DIAGNOSIS — D219 Benign neoplasm of connective and other soft tissue, unspecified: Secondary | ICD-10-CM

## 2017-12-27 NOTE — Telephone Encounter (Signed)
Patient schedule for October 3 for her ultrasound appointment.

## 2017-12-29 ENCOUNTER — Inpatient Hospital Stay: Payer: 59

## 2017-12-29 ENCOUNTER — Telehealth: Payer: Self-pay

## 2017-12-29 VITALS — BP 116/68 | HR 85 | Temp 98.4°F | Resp 17

## 2017-12-29 DIAGNOSIS — D500 Iron deficiency anemia secondary to blood loss (chronic): Secondary | ICD-10-CM

## 2017-12-29 DIAGNOSIS — Z86711 Personal history of pulmonary embolism: Secondary | ICD-10-CM | POA: Diagnosis not present

## 2017-12-29 MED ORDER — SODIUM CHLORIDE 0.9 % IV SOLN
510.0000 mg | Freq: Once | INTRAVENOUS | Status: AC
  Administered 2017-12-29: 510 mg via INTRAVENOUS
  Filled 2017-12-29: qty 17

## 2017-12-29 MED ORDER — SODIUM CHLORIDE 0.9 % IV SOLN
Freq: Once | INTRAVENOUS | Status: AC
  Administered 2017-12-29: 07:00:00 via INTRAVENOUS
  Filled 2017-12-29: qty 250

## 2017-12-29 NOTE — Patient Instructions (Signed)

## 2017-12-29 NOTE — Telephone Encounter (Signed)
Pregabalin has been approved until 12/21/2018. Pharmacy notified.

## 2017-12-30 ENCOUNTER — Other Ambulatory Visit: Payer: Self-pay | Admitting: Allergy

## 2017-12-30 ENCOUNTER — Other Ambulatory Visit: Payer: Self-pay | Admitting: Family Medicine

## 2017-12-30 ENCOUNTER — Other Ambulatory Visit: Payer: Self-pay | Admitting: Physical Medicine & Rehabilitation

## 2017-12-30 DIAGNOSIS — M797 Fibromyalgia: Secondary | ICD-10-CM

## 2017-12-30 DIAGNOSIS — J309 Allergic rhinitis, unspecified: Secondary | ICD-10-CM

## 2017-12-30 DIAGNOSIS — H1010 Acute atopic conjunctivitis, unspecified eye: Secondary | ICD-10-CM

## 2017-12-30 DIAGNOSIS — F419 Anxiety disorder, unspecified: Secondary | ICD-10-CM

## 2018-01-03 NOTE — Telephone Encounter (Signed)
Question from patient 

## 2018-01-04 ENCOUNTER — Ambulatory Visit: Payer: 59

## 2018-01-05 ENCOUNTER — Inpatient Hospital Stay: Payer: 59

## 2018-01-05 VITALS — BP 115/66 | HR 72 | Temp 97.8°F | Resp 18

## 2018-01-05 DIAGNOSIS — D500 Iron deficiency anemia secondary to blood loss (chronic): Secondary | ICD-10-CM

## 2018-01-05 DIAGNOSIS — Z86711 Personal history of pulmonary embolism: Secondary | ICD-10-CM | POA: Diagnosis not present

## 2018-01-05 MED ORDER — SODIUM CHLORIDE 0.9 % IV SOLN
510.0000 mg | Freq: Once | INTRAVENOUS | Status: AC
  Administered 2018-01-05: 510 mg via INTRAVENOUS
  Filled 2018-01-05: qty 17

## 2018-01-05 MED ORDER — SODIUM CHLORIDE 0.9 % IV SOLN
Freq: Once | INTRAVENOUS | Status: AC
  Administered 2018-01-05: 08:00:00 via INTRAVENOUS
  Filled 2018-01-05: qty 250

## 2018-01-05 NOTE — Progress Notes (Signed)
PT tolerated second IV iron infusion well.  VSS.  Stayed for entire post admin observation period.

## 2018-01-05 NOTE — Patient Instructions (Signed)

## 2018-01-10 ENCOUNTER — Telehealth: Payer: Self-pay | Admitting: Physical Medicine & Rehabilitation

## 2018-01-10 NOTE — Telephone Encounter (Signed)
I am not clear what I am to respond to or where.  I do not have a message from her.

## 2018-01-10 NOTE — Telephone Encounter (Signed)
Dr. Posey Pronto - please respond to Ns. Menta regarding the changes to her FMLA documents on MyChart - she follows that actively and let her know the data you already supplied are your responses for her FMLA - with no plan to adjust.  I did put a foot note on the copy before I faxed about covering transportation time to appointments - but did not speak to the diagnosis needing more than a half day of time out of office.

## 2018-01-12 ENCOUNTER — Encounter: Payer: 59 | Attending: Physical Medicine & Rehabilitation | Admitting: Physical Medicine & Rehabilitation

## 2018-01-12 ENCOUNTER — Telehealth: Payer: Self-pay | Admitting: Family Medicine

## 2018-01-12 ENCOUNTER — Encounter: Payer: Self-pay | Admitting: Physical Medicine & Rehabilitation

## 2018-01-12 ENCOUNTER — Other Ambulatory Visit: Payer: Self-pay

## 2018-01-12 VITALS — BP 116/79 | HR 87 | Ht 70.0 in | Wt 310.6 lb

## 2018-01-12 DIAGNOSIS — G894 Chronic pain syndrome: Secondary | ICD-10-CM

## 2018-01-12 DIAGNOSIS — F419 Anxiety disorder, unspecified: Secondary | ICD-10-CM | POA: Diagnosis not present

## 2018-01-12 DIAGNOSIS — R269 Unspecified abnormalities of gait and mobility: Secondary | ICD-10-CM | POA: Diagnosis not present

## 2018-01-12 DIAGNOSIS — D219 Benign neoplasm of connective and other soft tissue, unspecified: Secondary | ICD-10-CM | POA: Insufficient documentation

## 2018-01-12 DIAGNOSIS — Z6841 Body Mass Index (BMI) 40.0 and over, adult: Secondary | ICD-10-CM | POA: Insufficient documentation

## 2018-01-12 DIAGNOSIS — G43909 Migraine, unspecified, not intractable, without status migrainosus: Secondary | ICD-10-CM | POA: Diagnosis not present

## 2018-01-12 DIAGNOSIS — E1139 Type 2 diabetes mellitus with other diabetic ophthalmic complication: Secondary | ICD-10-CM | POA: Diagnosis not present

## 2018-01-12 DIAGNOSIS — G479 Sleep disorder, unspecified: Secondary | ICD-10-CM

## 2018-01-12 DIAGNOSIS — M797 Fibromyalgia: Secondary | ICD-10-CM

## 2018-01-12 DIAGNOSIS — R251 Tremor, unspecified: Secondary | ICD-10-CM | POA: Diagnosis not present

## 2018-01-12 DIAGNOSIS — J449 Chronic obstructive pulmonary disease, unspecified: Secondary | ICD-10-CM | POA: Insufficient documentation

## 2018-01-12 DIAGNOSIS — M545 Low back pain: Secondary | ICD-10-CM | POA: Insufficient documentation

## 2018-01-12 DIAGNOSIS — H42 Glaucoma in diseases classified elsewhere: Secondary | ICD-10-CM | POA: Diagnosis not present

## 2018-01-12 DIAGNOSIS — Z86711 Personal history of pulmonary embolism: Secondary | ICD-10-CM | POA: Insufficient documentation

## 2018-01-12 DIAGNOSIS — M791 Myalgia, unspecified site: Secondary | ICD-10-CM | POA: Diagnosis not present

## 2018-01-12 MED ORDER — PREGABALIN 75 MG PO CAPS: 75.0000 mg | ORAL_CAPSULE | Freq: Three times a day (TID) | ORAL | 1 refills | Status: DC

## 2018-01-12 NOTE — Progress Notes (Signed)
Subjective:    Patient ID: Holly Mcdonald, female    DOB: 1975/10/13, 42 y.o.   MRN: [id number]  HPI 42 year old female with past medical history of tremors, pulmonary embolism, morbid obesity, migraines, fibromyalgia, prediabetes, asthma, fibroids, chronic bronchitis presents with fibromyalgia.  Initially stated: Started 10/2016 after diagnosed PE.  Progressively getting worse.  Heat improved the pain.  Fibroids have exacerbated the pain, with surgery planned for 10/17/17.  Activity exacerbates the pain.  All qualities of pain.  Non-radiating.  Constant.  Associated muscle spasms and tingling.  Tylenol does not help.  Cymbalta provides some benefit. Denies falls. Pain limits all activities.   Works as Therapist, art.    Last clinic visit 12/16/17.  Several communications regarding FMLA since that time.  Since that time, pt states her left arm is hurting "really, really bad".  She states it hurt after doing a fist bump after completing a task with PT.  She is using Lidoderm patches with benefit, but states she needs a whole body pain patch. She is only on baby aspirin. Lyrica is more effective than Gabapentin. She is taking Baclofen 10.  She tried TENS with benefit. Denies falls. Her weight is stable.  Pain Inventory Average Pain 3 Pain Right Now 4 My pain is sharp, dull, stabbing and aching  In the last 24 hours, has pain interfered with the following? General activity 2 Relation with others 0 Enjoyment of life 2 What TIME of day is your pain at its worst? all Sleep (in general) Good  Pain is worse with: walking, bending, sitting, standing and some activites Pain improves with: rest, heat/ice, therapy/exercise, pacing activities, medication and injections Relief from Meds: 2  Mobility walk without assistance how many minutes can you walk? 15 ability to climb steps?  yes do you drive?  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  no  Physicians involved in your care Primary care Dr. [name]   Family History  Problem Relation Age of Onset  . Pulmonary embolism Mother        Last year  . Mental retardation Mother        PTSD and fibromyalgia  . Heart disease Mother        CHF  . Congestive Heart Failure Mother   . Heart disease Father 49       CAD  . Hyperlipidemia Father   . Diabetes Paternal Grandmother   . Heart attack Paternal Grandmother   . Congestive Heart Failure Maternal Aunt   . Cancer Neg Hx    Social History   Socioeconomic History  . Marital status: Divorced    Spouse name: Not on file  . Number of children: Not on file  . Years of education: Not on file  . Highest education level: Not on file  Occupational History  . Occupation: representative    Comment: [employer]  Social Needs  . Financial resource strain: Not on file  . Food insecurity:    Worry: Not on file    Inability: Not on file  . Transportation needs:    Medical: Not on file    Non-medical: Not on file  Tobacco Use  . Smoking status: Never Smoker  . Smokeless tobacco: Never Used  Substance and Sexual Activity  . Alcohol use: Yes    Comment: occasionally  . Drug  use: No  . Sexual activity: Yes    Birth control/protection: None    Comment: 1st intercourse 42 yo-More than 5 partners    Lifestyle  . Physical activity:    Days per week: Not on file    Minutes per session: Not on file  . Stress: Not on file  Relationships  . Social connections:    Talks on phone: Not on file    Gets together: Not on file    Attends religious service: Not on file    Active member of club or organization: Not on file    Attends meetings of clubs or organizations: Not on file    Relationship status: Not on file  Other Topics Concern  . Not on file  Social History Narrative  . Not on file   Past  Surgical History:  Procedure Laterality Date  . ANAL EXAMINATION UNDER ANESTHESIA  age 54   w/ removal cyst  . DILATATION & CURETTAGE/HYSTEROSCOPY WITH MYOSURE N/A 10/17/2017   Procedure: [place];  Surgeon: Anastasio Auerbach, MD;  Location: [facility];  Service: Gynecology;  Laterality: N/A;  . GYNECOLOGIC CRYOSURGERY  age 28s  . TONSILLECTOMY    . TRANSTHORACIC ECHOCARDIOGRAM  01/17/2017   ef 60-65%/  mild TR   Past Medical History:  Diagnosis Date  . Anticoagulated    eliquis  . Bladder spasm   . Fibromyalgia   . GERD (gastroesophageal reflux disease)   . Heavy menstrual bleeding   . History of chronic bronchitis   . History of pulmonary embolism 10/2016--- followed by pcp and pulmoloigst   dx bilateral PE , bilateral lower lobes , segmental , unprovoked, negative hypercoagulable work-up-- treated w/ eliquis  . Iron deficiency anemia   . Migraine   . Mild intermittent asthma    pulmologist-  dr Elsworth Soho  . Morbid obesity with BMI of 45.0-49.9, adult ([place])   . Open-angle glaucoma of both eyes   . Pre-diabetes   . 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 Urgency of urination   . Uterine fibroid   . Vitamin D deficiency   . Wears glasses    BP 116/79 (BP Location: Left Arm, Patient Position: Sitting, Cuff Size: Large)   Pulse 87   Ht 5\' 10"  (1.778 m)   Wt (!) 310 lb 9.6 oz (140.9 kg)   LMP 12/13/2017 Comment: fibroids.  SpO2 96%   BMI 44.57 kg/m   Opioid Risk Score:   Fall Risk Score:  `1  Depression screen PHQ 2/9  Depression screen [facility] 2/9 01/12/2018 10/07/2017  Decreased Interest 0 0  Down, Depressed, Hopeless 0 0  PHQ - 2 Score 0 0  Altered sleeping - 1  Tired, decreased energy - 3  Change in appetite - 3  Feeling bad or failure about yourself  - 0  Trouble concentrating - 0  Moving slowly or fidgety/restless - 0  Suicidal thoughts - 0  PHQ-9 Score - 7  Difficult doing work/chores  - Somewhat difficult   Review of Systems  Constitutional: Negative.   HENT: Negative.   Eyes: Negative.   Respiratory: Negative.   Cardiovascular: Negative.   Gastrointestinal: Positive for abdominal pain.  Endocrine: Negative.   Genitourinary: Positive for difficulty urinating.  Musculoskeletal: Positive for arthralgias, back pain, myalgias and neck pain.  Skin: Negative.   Allergic/Immunologic: Negative.   Neurological: Positive for tremors and numbness.  Hematological: Negative.   Psychiatric/Behavioral: The patient is nervous/anxious.  All other systems reviewed and are negative.     Objective:   Physical Exam Gen: Vital signs reviewed. Distressed. HENT: Normocephalic, Atraumatic Eyes: EOMI. No discharge.  Cardio: RRR. No JVD. Pulm: B/l clear to auscultation.  Effort normal. Abd: Nondistended, BS+ MSK:  Gait antalgic   TTP diffusely with minimal touch b/l UE and upper back   No edema.  Neuro:  Strength  4-/5 in all UE myotomes (pain inhibition) Skin: Warm and Dry. Intact. Tattoo on right arm and left upper back.    Assessment & Plan:  42 year old female with past medical history of tremors, pulmonary embolism, morbid obesity, migraines, fibromyalgia, prediabetes, asthma, fibroids, chronic bronchitis presents with pain all over.   1. Chronic diffuse pain  Will avoid NSAIDs due to DOAC and PE, will reconsider when anticoag d/ced on next visit  Side effects with Gabapentin  Neck xray reviewed, showing mild disc narrowing at C7-T1.  Cont Heat, trial cold  Cont aquatic PT  Will order TENS IT  Cont Lidoderm OTC   Will increase Lyrica 75 TID  Cont Baclofen 10 TID prn, trial 5 mg, reminded again  Referral to Psychology - pt to follow up at work  Will consider accupuncture  Recommend referral to Rheum  Severe pain today with minimal movement  Will call [place] for clarification of FLMA   2. Gait abnormality  Does not believe she needs cane at present  3. Sleep  disturbance  Cont meds  4. Morbid Obesity  Cont to follow up with nutritionist  No weight loss at present  5. Myalgia   Will consider trigger point injections when able to tolerate  See #1  6. Migraines  Cont Topamax

## 2018-01-12 NOTE — Telephone Encounter (Signed)
Copied from [place] [id number]. Topic: Referral - Request >> Jan 12, 2018 11:02 AM Valla Leaver wrote: Reason for CRM: Patient needs [place] to place a referral to [facility] only if its in network with insurance for a possible mixed connective tissue disorder. Please call once placed.

## 2018-01-13 NOTE — Telephone Encounter (Signed)
She was already referred to rheumatologist in 06/2017, referral was declined at that time. Rheumatologic work-up has been otherwise negative. History of fibromyalgia.  Does she need another referral? Can she scheduled appointment?  Thanks, BJ

## 2018-01-13 NOTE — Telephone Encounter (Signed)
Referral request being taking care of by referral coordinator, Nonah Mattes. No further assistance needed by myself at this time.

## 2018-01-16 NOTE — Telephone Encounter (Signed)
°  Faxed referral / ov notes to they will contact pt to scheduled directly [facility] Address: [address], [place], [place] [address] Phone: [phone]

## 2018-01-22 ENCOUNTER — Other Ambulatory Visit: Payer: Self-pay

## 2018-01-22 ENCOUNTER — Encounter (HOSPITAL_COMMUNITY): Payer: Self-pay | Admitting: Emergency Medicine

## 2018-01-22 ENCOUNTER — Emergency Department (HOSPITAL_COMMUNITY)
Admission: EM | Admit: 2018-01-22 | Discharge: 2018-01-22 | Disposition: A | Discharge status: Home / Self Care | Payer: 59 | Attending: Emergency Medicine | Admitting: Emergency Medicine

## 2018-01-22 DIAGNOSIS — N939 Abnormal uterine and vaginal bleeding, unspecified: Secondary | ICD-10-CM | POA: Diagnosis not present

## 2018-01-22 DIAGNOSIS — R7303 Prediabetes: Secondary | ICD-10-CM | POA: Insufficient documentation

## 2018-01-22 DIAGNOSIS — Z7982 Long term (current) use of aspirin: Secondary | ICD-10-CM | POA: Insufficient documentation

## 2018-01-22 DIAGNOSIS — Z79899 Other long term (current) drug therapy: Secondary | ICD-10-CM | POA: Insufficient documentation

## 2018-01-22 DIAGNOSIS — R5383 Other fatigue: Secondary | ICD-10-CM | POA: Diagnosis present

## 2018-01-22 DIAGNOSIS — N390 Urinary tract infection, site not specified: Secondary | ICD-10-CM | POA: Diagnosis not present

## 2018-01-22 LAB — COMPREHENSIVE METABOLIC PANEL
ALBUMIN: 3.7 g/dL (ref 3.5–5.0)
ALK PHOS: 50 U/L (ref 38–126)
ALT: 10 U/L (ref 0–44)
AST: 14 U/L — AB (ref 15–41)
Anion gap: 8 (ref 5–15)
BUN: 13 mg/dL (ref 6–20)
CALCIUM: 8.9 mg/dL (ref 8.9–10.3)
CO2: 21 mmol/L — AB (ref 22–32)
CREATININE: 0.79 mg/dL (ref 0.44–1.00)
Chloride: 111 mmol/L (ref 98–111)
GFR calc Af Amer: >60 mL/min (ref >60)
GFR calc non Af Amer: >60 mL/min (ref >60)
GLUCOSE: 119 mg/dL — AB (ref 70–99)
Potassium: 3.6 mmol/L (ref 3.5–5.1)
SODIUM: 140 mmol/L (ref 135–145)
Total Bilirubin: 0.2 mg/dL — ABNORMAL LOW (ref 0.3–1.2)
Total Protein: 6.6 g/dL (ref 6.5–8.1)

## 2018-01-22 LAB — CBC WITH DIFFERENTIAL/PLATELET
BASOS PCT: 1 %
Basophils Absolute: 0.1 10*3/uL (ref 0.0–0.1)
EOS ABS: 0 10*3/uL (ref 0.0–0.7)
Eosinophils Relative: 0 %
HCT: 39.5 % (ref 36.0–46.0)
HEMOGLOBIN: 12.8 g/dL (ref 12.0–15.0)
Lymphocytes Relative: 37 %
Lymphs Abs: 2.5 10*3/uL (ref 0.7–4.0)
MCH: 28.7 pg (ref 26.0–34.0)
MCHC: 32.4 g/dL (ref 30.0–36.0)
MCV: 88.6 fL (ref 78.0–100.0)
Monocytes Absolute: 0.4 10*3/uL (ref 0.1–1.0)
Monocytes Relative: 7 %
NEUTROS PCT: 55 %
Neutro Abs: 3.8 10*3/uL (ref 1.7–7.7)
Platelets: 211 10*3/uL (ref 150–400)
RBC: 4.46 MIL/uL (ref 3.87–5.11)
RDW: 15 % (ref 11.5–15.5)
WBC: 6.8 10*3/uL (ref 4.0–10.5)

## 2018-01-22 LAB — URINALYSIS, ROUTINE W REFLEX MICROSCOPIC
BACTERIA UA: NONE SEEN
Bilirubin Urine: NEGATIVE
Glucose, UA: NEGATIVE mg/dL
KETONES UR: NEGATIVE mg/dL
Nitrite: NEGATIVE
PROTEIN: NEGATIVE mg/dL
Specific Gravity, Urine: 1.025 (ref 1.005–1.030)
pH: 6 (ref 5.0–8.0)

## 2018-01-22 LAB — LIPASE, BLOOD: Lipase: 36 U/L (ref 11–51)

## 2018-01-22 LAB — HCG, SERUM, QUALITATIVE: PREG SERUM: NEGATIVE

## 2018-01-22 MED ORDER — CEPHALEXIN 500 MG PO CAPS
500.0000 mg | ORAL_CAPSULE | Freq: Once | ORAL | Status: AC
  Administered 2018-01-22: 500 mg via ORAL
  Filled 2018-01-22: qty 1

## 2018-01-22 MED ORDER — CEPHALEXIN 500 MG PO CAPS: 500.0000 mg | ORAL_CAPSULE | Freq: Three times a day (TID) | ORAL | 0 refills | Status: DC
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
## 2018-01-22 NOTE — ED Notes (Signed)
EKG given to EDP,Yelverton,MD., for review. 

## 2018-01-24 LAB — URINE CULTURE: Culture: NO GROWTH

## 2018-01-29 ENCOUNTER — Other Ambulatory Visit: Payer: Self-pay | Admitting: Family Medicine

## 2018-01-29 DIAGNOSIS — M797 Fibromyalgia: Secondary | ICD-10-CM

## 2018-01-29 DIAGNOSIS — F419 Anxiety disorder, unspecified: Secondary | ICD-10-CM

## 2018-02-03 ENCOUNTER — Encounter: Payer: Self-pay | Admitting: Gynecology

## 2018-02-03 ENCOUNTER — Ambulatory Visit (INDEPENDENT_AMBULATORY_CARE_PROVIDER_SITE_OTHER): Payer: 59 | Admitting: Gynecology

## 2018-02-03 VITALS — BP 126/82 | Ht 70.0 in | Wt 308.0 lb

## 2018-02-03 DIAGNOSIS — N921 Excessive and frequent menstruation with irregular cycle: Secondary | ICD-10-CM | POA: Diagnosis not present

## 2018-02-03 DIAGNOSIS — Z01419 Encounter for gynecological examination (general) (routine) without abnormal findings: Secondary | ICD-10-CM

## 2018-02-03 NOTE — Progress Notes (Signed)
    Holly Mcdonald 20-Jul-1975 [id number]        42 y.o.  G0P0000 for annual gynecologic exam.  Also presents in reference to management of her leiomyoma.  Past medical history,surgical history, problem list, medications, allergies, family history and social history were all reviewed and documented as reviewed in the EPIC chart.  ROS:  Performed with pertinent positives and negatives included in the history, assessment and plan.   Additional significant findings : None   Exam: Holly Mcdonald assistant Vitals:   02/03/18 0906  BP: 126/82  Weight: (!) 308 lb (139.7 kg)  Height: 5\' 10"  (1.778 m)   Body mass index is 44.19 kg/m.  General appearance:  Normal affect, orientation and appearance. Skin: Grossly normal HEENT: Without gross lesions.  No cervical or supraclavicular adenopathy. Thyroid normal.  Lungs:  Clear without wheezing, rales or rhonchi Cardiac: RR, without RMG Abdominal:  Soft, nontender, without masses, guarding, rebound, organomegaly or hernia Breasts:  Examined lying and sitting without masses, retractions, discharge or axillary adenopathy. Pelvic:  Ext, BUS, Vagina: Normal  Cervix: Normal.  Pap smear done  Uterus: Difficult to palpate, not significantly enlarged  Adnexa: Without masses or tenderness    Anus and perineum: Normal   Rectovaginal: Normal sphincter tone without palpated masses or tenderness.    Assessment/Plan:  42 y.o. G0P0000 female for annual gynecologic exam with irregular bleeding.   1. Irregular bleeding/leiomyoma.  Patient with history of multiple smaller myomas and heavy irregular bleeding.  Had been on Eliquis for bilateral pulmonary emboli 10/2016.  Had initial trial of Mirena IUD which was spontaneously extruded.  Sonohysterogram showed submucous myoma she underwent a hysteroscopic submucous myomectomy earlier this year.  Continue to have irregular heavy bleeding.  Received Depo-Lupron early July and is due to receive her follow-up  shot this coming week.  Still is having irregular bleeding with heavy episodes requiring Megace to control.  Has ultrasound scheduled next week for pelvic surveillance.  Patient questioning about proceeding with hysterectomy.  I reviewed again options with her to include continued attempts at medical management, retrial of Mirena IUD, endometrial ablation, uterine artery embolization, myomectomy and hysterectomy.  Patient also has a history of bilateral retinal hemorrhages while on Eliquis which was stopped and apparently they have resolved.  Patient very strongly wants to consider hysterectomy and I think given her total picture a robotic approach would be desired if possible given her weight, uterine enlargement, nulliparous status.  Given her medical issues I think referral to a [facility] would be appropriate.  She is in agreement with this and will help her make those arrangements for consultation.  Recent hemoglobin 12.8 2 weeks ago. 2. Mammography coming due in October and I reminded her to schedule this.  Breast exam normal today. 3. Pap smear 2018.  I repeated her Pap smear today.  No history of significant abnormal Pap smears. 4. Health maintenance.  No routine lab work done as patient does this elsewhere.  Follow-up for ultrasound next week.  Follow-up for consultation for hysterectomy.   Holly Auerbach MD, 10:32 AM 02/03/2018

## 2018-02-03 NOTE — Patient Instructions (Signed)
Follow-up for ultrasound as scheduled  Office will contact you to help arrange appointment for hysterectomy

## 2018-02-03 NOTE — Addendum Note (Signed)
Addended by: Nelva Nay on: 02/03/2018 12:13 PM   Modules accepted: Orders

## 2018-02-06 ENCOUNTER — Encounter: Payer: Self-pay | Admitting: *Deleted

## 2018-02-06 ENCOUNTER — Telehealth: Payer: Self-pay | Admitting: *Deleted

## 2018-02-06 NOTE — Telephone Encounter (Signed)
Patient scheduled on 03/03/18 @ 10:00am at [facility] with Dr. Georgiann Mohs , office notes faxed to [phone], my chart message sent to patient with this info.

## 2018-02-06 NOTE — Telephone Encounter (Signed)
-----   Message from Ramond Craver, [place] sent at 02/03/2018 10:49 AM EDT -----   ----- Message ----- From: Anastasio Auerbach, MD Sent: 02/03/2018  10:46 AM EDT To: Ramond Craver, RMA  Recommend referral to GYN at [facility] reference consider hysterectomy

## 2018-02-07 LAB — PAP IG W/ RFLX HPV ASCU

## 2018-02-08 ENCOUNTER — Other Ambulatory Visit: Payer: Self-pay | Admitting: Gynecology

## 2018-02-08 ENCOUNTER — Encounter: Payer: 59 | Attending: Physical Medicine & Rehabilitation | Admitting: Physical Medicine & Rehabilitation

## 2018-02-08 ENCOUNTER — Other Ambulatory Visit: Payer: Self-pay | Admitting: *Deleted

## 2018-02-08 ENCOUNTER — Encounter: Payer: Self-pay | Admitting: Physical Medicine & Rehabilitation

## 2018-02-08 ENCOUNTER — Telehealth: Payer: Self-pay | Admitting: Family Medicine

## 2018-02-08 VITALS — BP 117/81 | HR 88 | Resp 14 | Ht 70.0 in | Wt 312.0 lb

## 2018-02-08 DIAGNOSIS — R269 Unspecified abnormalities of gait and mobility: Secondary | ICD-10-CM | POA: Diagnosis not present

## 2018-02-08 DIAGNOSIS — M791 Myalgia, unspecified site: Secondary | ICD-10-CM

## 2018-02-08 DIAGNOSIS — J449 Chronic obstructive pulmonary disease, unspecified: Secondary | ICD-10-CM | POA: Insufficient documentation

## 2018-02-08 DIAGNOSIS — F419 Anxiety disorder, unspecified: Secondary | ICD-10-CM | POA: Diagnosis not present

## 2018-02-08 DIAGNOSIS — M359 Systemic involvement of connective tissue, unspecified: Secondary | ICD-10-CM | POA: Insufficient documentation

## 2018-02-08 DIAGNOSIS — G894 Chronic pain syndrome: Secondary | ICD-10-CM

## 2018-02-08 DIAGNOSIS — D219 Benign neoplasm of connective and other soft tissue, unspecified: Secondary | ICD-10-CM | POA: Insufficient documentation

## 2018-02-08 DIAGNOSIS — Z6841 Body Mass Index (BMI) 40.0 and over, adult: Secondary | ICD-10-CM | POA: Diagnosis not present

## 2018-02-08 DIAGNOSIS — M545 Low back pain: Secondary | ICD-10-CM | POA: Diagnosis not present

## 2018-02-08 DIAGNOSIS — H42 Glaucoma in diseases classified elsewhere: Secondary | ICD-10-CM | POA: Diagnosis not present

## 2018-02-08 DIAGNOSIS — M797 Fibromyalgia: Secondary | ICD-10-CM | POA: Diagnosis present

## 2018-02-08 DIAGNOSIS — Z86711 Personal history of pulmonary embolism: Secondary | ICD-10-CM | POA: Diagnosis not present

## 2018-02-08 DIAGNOSIS — G479 Sleep disorder, unspecified: Secondary | ICD-10-CM | POA: Insufficient documentation

## 2018-02-08 DIAGNOSIS — M1990 Unspecified osteoarthritis, unspecified site: Secondary | ICD-10-CM

## 2018-02-08 DIAGNOSIS — G43909 Migraine, unspecified, not intractable, without status migrainosus: Secondary | ICD-10-CM | POA: Insufficient documentation

## 2018-02-08 DIAGNOSIS — R251 Tremor, unspecified: Secondary | ICD-10-CM | POA: Insufficient documentation

## 2018-02-08 DIAGNOSIS — E1139 Type 2 diabetes mellitus with other diabetic ophthalmic complication: Secondary | ICD-10-CM | POA: Diagnosis not present

## 2018-02-08 DIAGNOSIS — E66813 Obesity, class 3: Secondary | ICD-10-CM

## 2018-02-08 MED ORDER — METRONIDAZOLE 500 MG PO TABS: 500.0000 mg | ORAL_TABLET | Freq: Two times a day (BID) | ORAL | 0 refills | Status: DC

## 2018-02-08 MED ORDER — METHOCARBAMOL 500 MG PO TABS: 500.0000 mg | ORAL_TABLET | Freq: Two times a day (BID) | ORAL | 1 refills | Status: DC | PRN

## 2018-02-08 NOTE — Telephone Encounter (Signed)
Copied from [place] [id number]. Topic: Referral - Request >> Feb 08, 2018 11:39 AM Yvette Rack wrote: Reason for CRM: Pt states she has an appt on 04/20/18 at 8 am with psychiatrist Dr. Grier Mitts [place] [address] Phone# [phone]  Fax# [phone]. Pt states she has the [employer] and requests that a referral be sent [facility].

## 2018-02-08 NOTE — Telephone Encounter (Signed)
Referral placed as requested.

## 2018-02-08 NOTE — Telephone Encounter (Signed)
Patient received message

## 2018-02-08 NOTE — Progress Notes (Signed)
Subjective:    Patient ID: Holly Mcdonald, female    DOB: 26-Aug-1975, 42 y.o.   MRN: [id number]  HPI 42 year old female with past medical history of tremors, pulmonary embolism, morbid obesity, migraines, fibromyalgia, prediabetes, asthma, fibroids, chronic bronchitis presents with fibromyalgia.  Initially stated: Started 10/2016 after diagnosed PE.  Progressively getting worse.  Heat improved the pain.  Fibroids have exacerbated the pain, with surgery planned for 10/17/17.  Activity exacerbates the pain.  All qualities of pain.  Non-radiating.  Constant.  Associated muscle spasms and tingling.  Tylenol does not help.  Cymbalta provides some benefit. Denies falls. Pain limits all activities.   Works as Therapist, art.    Last clinic visit 02/03/18.  Since that time, attempted to contact insurance company with no call back.  Since that time, pt also went to the ED for fatigue suspected due to UTI, notes reviewed. She states she is going to see GYN for abnormal PAP smear.  She saw Rheum and states she was also diagnosed with connective tissue disorder/inflamatory arthritis - notes/labs reviewed. She is planning on going to [facility] for hysterectomy as well. She completed therapies. She states she never received a call regarding TENS unit. She notes benefit with increase in Lyrica.  She cannot tolerate Baclofen. 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 Denies falls. She has not lost any weight since last visit.  Pain Inventory Average Pain 3 Pain Right Now 3 My pain is intermittent, dull, tingling and aching  In the last 24 hours, has pain interfered with the following? General activity 2 Relation with others 0 Enjoyment of life 0 What TIME of day is your pain at its worst? all Sleep (in general) Good  Pain is worse with: walking, sitting, standing and some activites Pain improves with: rest, heat/ice, therapy/exercise, pacing activities and  medication Relief from Meds: 2  Mobility walk without assistance how many minutes can you walk? 15 ability to climb steps?  yes do you drive?  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  no  Physicians involved in your care Primary care Dr. [name]   Family History  Problem Relation Age of Onset  . Pulmonary embolism Mother        Last year  . Mental retardation Mother        PTSD and fibromyalgia  . Heart disease Mother        CHF  . Congestive Heart Failure Mother   . Heart disease Father 63       CAD  . Hyperlipidemia Father   . Diabetes Paternal Grandmother   . Heart attack Paternal Grandmother   . Congestive Heart Failure Maternal Aunt   . Cancer Neg Hx    Social History   Socioeconomic History  . Marital status: Divorced    Spouse name: Not on file  . Number of children: Not on file  . Years of education: Not on file  . Highest education level: Not on file  Occupational History  . Occupation: representative    Comment: [employer]  Social Needs  . Financial resource strain: Not on file  . Food insecurity:    Worry: Not on file    Inability: Not on file  . Transportation needs:    Medical: Not on file    Non-medical: Not on file  Tobacco  Use  . Smoking status: Never Smoker  . Smokeless tobacco: Never Used  Substance and Sexual Activity  . Alcohol use: Yes    Comment: occasionally  . Drug use: No  . Sexual activity: Yes    Birth control/protection: None    Comment: 1st intercourse 42 yo-More than 5 partners    Lifestyle  . Physical activity:    Days per week: Not on file    Minutes per session: Not on file  . Stress: Not on file  Relationships  . Social connections:    Talks on phone: Not on file    Gets together: Not on file    Attends religious service: Not on file    Active member of  club or organization: Not on file    Attends meetings of clubs or organizations: Not on file    Relationship status: Not on file  Other Topics Concern  . Not on file  Social History Narrative  . Not on file   Past Surgical History:  Procedure Laterality Date  . ANAL EXAMINATION UNDER ANESTHESIA  age 62   w/ removal cyst  . DILATATION & CURETTAGE/HYSTEROSCOPY WITH MYOSURE N/A 10/17/2017   Procedure: [place];  Surgeon: Anastasio Auerbach, MD;  Location: [facility];  Service: Gynecology;  Laterality: N/A;  . GYNECOLOGIC CRYOSURGERY  age 24s  . TONSILLECTOMY    . TRANSTHORACIC ECHOCARDIOGRAM  01/17/2017   ef 60-65%/  mild TR   Past Medical History:  Diagnosis Date  . Anticoagulated    eliquis  . Arthritis   . Bladder spasm   . Fibromyalgia   . GERD (gastroesophageal reflux disease)   . Heavy menstrual bleeding   . History of chronic bronchitis   . History of pulmonary embolism 10/2016--- followed by pcp and pulmoloigst   dx bilateral PE , bilateral lower lobes , segmental , unprovoked, negative hypercoagulable work-up-- treated w/ eliquis  . Iron deficiency anemia   . Migraine   . Mild intermittent asthma    pulmologist-  dr Elsworth Soho  . Morbid obesity with BMI of 45.0-49.9, adult ([place])   . Open-angle glaucoma of both eyes   . Pre-diabetes   . 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 Urgency of urination   . Uterine fibroid   . Vitamin D deficiency   . Wears glasses    BP 117/81 (BP Location: Right Arm, Patient Position: Sitting, Cuff Size: Large)   Pulse 88   Resp 14   Ht 5\' 10"  (1.778 m)   Wt (!) 312 lb (141.5 kg)   LMP  (LMP Unknown)   SpO2 97%   BMI 44.77 kg/m   Opioid Risk Score:   Fall Risk Score:  `1  Depression screen PHQ 2/9  Depression screen [facility] 2/9 01/12/2018 10/07/2017  Decreased Interest 0 0  Down, Depressed, Hopeless 0 0  PHQ - 2 Score 0 0  Altered sleeping - 1  Tired,  decreased energy - 3  Change in appetite - 3  Feeling bad or failure about yourself  - 0  Trouble concentrating - 0  Moving slowly or fidgety/restless - 0  Suicidal thoughts - 0  PHQ-9 Score - 7  Difficult doing work/chores - Somewhat difficult   Review of Systems  Constitutional: Negative.   HENT: Negative.   Eyes: Negative.   Respiratory: Negative.   Cardiovascular: Negative.   Gastrointestinal: Negative.   Endocrine: Negative.   Genitourinary: Positive for difficulty urinating.  Musculoskeletal: Positive for arthralgias, back pain, myalgias and neck pain.  Skin: Negative.   Allergic/Immunologic: Negative.   Neurological: Positive for tremors and numbness.       Tingling  Hematological: Negative.   Psychiatric/Behavioral: Negative.   All other systems reviewed and are negative.     Objective:   Physical Exam Gen: Vital signs reviewed. NAD. HENT: Normocephalic, Atraumatic Eyes: EOMI. No discharge.  Cardio: RRR. No JVD. Pulm: B/l clear to auscultation.  Effort normal. Abd: Nondistended, BS+ MSK:  Gait antalgic, improved  TTP diffusely with minimal touch b/l UE and upper back   No edema.  Neuro:  Strength  4-/5 in all UE myotomes (pain inhibition) Skin: Warm and Dry. Intact. Tattoo on right arm and left upper back.    Assessment & Plan:  42 year old female with past medical history of tremors, pulmonary embolism, morbid obesity, migraines, fibromyalgia, prediabetes, asthma, fibroids, chronic bronchitis presents with pain all over.   1. Chronic diffuse pain  Multifactorial - Fibromyalgia, connective tissue disorder, inflammatory arthritis, fibroids  Side effects with Gabapentin  Neck xray reviewed, showing mild disc narrowing at C7-T1.  Cont Heat, trial cold  Encouraged pool therapy (completed aquatic therapy)  Order TENS IT, states she never received a call, will follow up  Cont Lidoderm OTC   Cont Lyrica 75 TID  Cont Baclofen 10 qhs, unable to tolerate during the  day  Will order Robaxin 500 BID PRN  Referral to Psychology - now states she will try to follow up here  Will consider accupuncture  Collow up with Rheum - recently started on steroid dose pack, to start Plaquinil today  Follow up with OB[facility]GYN at [facility] for planned hysterectomy   Will consider NSAID    2. Gait abnormality  Does not believe she needs cane at present  3. Sleep disturbance  Cont meds  4. Morbid Obesity  Cont to follow up with nutritionist  No weight loss at present  5. Myalgia   Will consider trigger point injections when able to tolerate  See #1  6. Migraines  Cont Topamax

## 2018-02-09 ENCOUNTER — Other Ambulatory Visit: Payer: Self-pay | Admitting: Gynecology

## 2018-02-09 ENCOUNTER — Ambulatory Visit (INDEPENDENT_AMBULATORY_CARE_PROVIDER_SITE_OTHER): Payer: 59

## 2018-02-09 ENCOUNTER — Ambulatory Visit (INDEPENDENT_AMBULATORY_CARE_PROVIDER_SITE_OTHER): Payer: 59 | Admitting: Gynecology

## 2018-02-09 ENCOUNTER — Encounter: Payer: Self-pay | Admitting: Gynecology

## 2018-02-09 ENCOUNTER — Telehealth: Payer: Self-pay | Admitting: Family Medicine

## 2018-02-09 ENCOUNTER — Inpatient Hospital Stay: Payer: 59 | Attending: Hematology

## 2018-02-09 VITALS — BP 124/80

## 2018-02-09 DIAGNOSIS — N852 Hypertrophy of uterus: Secondary | ICD-10-CM

## 2018-02-09 DIAGNOSIS — D251 Intramural leiomyoma of uterus: Secondary | ICD-10-CM

## 2018-02-09 DIAGNOSIS — Z86711 Personal history of pulmonary embolism: Secondary | ICD-10-CM | POA: Insufficient documentation

## 2018-02-09 DIAGNOSIS — D259 Leiomyoma of uterus, unspecified: Secondary | ICD-10-CM

## 2018-02-09 DIAGNOSIS — N939 Abnormal uterine and vaginal bleeding, unspecified: Secondary | ICD-10-CM

## 2018-02-09 DIAGNOSIS — Z86718 Personal history of other venous thrombosis and embolism: Secondary | ICD-10-CM

## 2018-02-09 DIAGNOSIS — D250 Submucous leiomyoma of uterus: Secondary | ICD-10-CM

## 2018-02-09 DIAGNOSIS — D219 Benign neoplasm of connective and other soft tissue, unspecified: Secondary | ICD-10-CM

## 2018-02-09 DIAGNOSIS — N921 Excessive and frequent menstruation with irregular cycle: Secondary | ICD-10-CM

## 2018-02-09 LAB — CBC WITH DIFFERENTIAL/PLATELET
Basophils Absolute: 0.1 10*3/uL (ref 0.0–0.1)
Basophils Relative: 1 %
EOS ABS: 0 10*3/uL (ref 0.0–0.5)
Eosinophils Relative: 0 %
HCT: 37.1 % (ref 34.8–46.6)
HEMOGLOBIN: 12.4 g/dL (ref 11.6–15.9)
Lymphocytes Relative: 29 %
Lymphs Abs: 2.4 10*3/uL (ref 0.9–3.3)
MCH: 28.7 pg (ref 25.1–34.0)
MCHC: 33.3 g/dL (ref 31.5–36.0)
MCV: 86.1 fL (ref 79.5–101.0)
MONOS PCT: 6 %
Monocytes Absolute: 0.5 10*3/uL (ref 0.1–0.9)
NEUTROS PCT: 64 %
Neutro Abs: 5.3 10*3/uL (ref 1.5–6.5)
Platelets: 234 10*3/uL (ref 145–400)
RBC: 4.31 MIL/uL (ref 3.70–5.45)
RDW: 17.6 % — ABNORMAL HIGH (ref 11.2–14.5)
WBC: 8.3 10*3/uL (ref 3.9–10.3)

## 2018-02-09 MED ORDER — LEUPROLIDE ACETATE (3 MONTH) 11.25 MG IM KIT
11.2500 mg | PACK | Freq: Once | INTRAMUSCULAR | Status: AC
  Administered 2018-02-09: 11.25 mg via INTRAMUSCULAR

## 2018-02-09 NOTE — Addendum Note (Signed)
Addended by: Nelva Nay on: 02/09/2018 10:14 AM   Modules accepted: Orders

## 2018-02-09 NOTE — Telephone Encounter (Signed)
I do not see that Dr. Martinique has ordered a referral to a headache clinic.

## 2018-02-09 NOTE — Patient Instructions (Signed)
Follow-up for your scheduled appointments as arranged

## 2018-02-09 NOTE — Telephone Encounter (Signed)
Copied from [place] [id number]. Topic: Referral - Request >> Dec 21, 2017 12:16 PM Margot Ables wrote: Reason for CRM: Pt has [facility] which requires referral and auth for specialist visits. Pt has appt 02/14/18 with Dr. Domingo Cocking. Please obtain new referral and auth. Notify pt when obtained.   [facility]:  Orie Rout MD Address: [address] [place], [place] [address] Phone: [phone] >> Feb 09, 2018 11:20 AM Vernona Rieger wrote: Patient states that Dr Kirstie Mirza office said they never received the referral for her. Her appointment is 10/8 with Dr Domingo Cocking. Can someone reach out to her? [phone]

## 2018-02-09 NOTE — Telephone Encounter (Unsigned)
Copied from [place] [id number]. Topic: Referral - Request >> Dec 21, 2017 12:16 PM Margot Ables wrote: Reason for CRM: Pt has [facility] which requires referral and auth for specialist visits. Pt has appt 02/14/18 with Dr. Domingo Cocking. Please obtain new referral and auth. Notify pt when obtained.   [facility]:  Orie Rout MD Address: [address] [place], [place] [address] Phone: [phone] >> Feb 09, 2018 11:20 AM Vernona Rieger wrote: Patient states that Dr Kirstie Mirza office said they never received the referral for her. Her appointment is 10/8 with Dr Domingo Cocking. Can someone reach out to her? [phone]

## 2018-02-09 NOTE — Progress Notes (Signed)
    Holly Mcdonald 08-22-1975 [id number]        42 y.o.  G0P0000 follows up for ultrasound.  String of multiple myomas with menorrhagia and irregular bleeding.  Received her second shot of Depo-Lupron today.  Still continues to bleed on and off requiring Megace to control.  Status post submucous myomectomy in the past which did not seem to help her bleeding.  Trial of Mirena IUD which was spontaneously extruded.    Past medical history,surgical history, problem list, medications, allergies, family history and social history were all reviewed and documented in the EPIC chart.  Directed ROS with pertinent positives and negatives documented in the history of present illness/assessment and plan.  Exam: Vitals:   02/09/18 0948  BP: 124/80   General appearance:  Normal  Ultrasound transvaginal and transabdominal shows uterus enlarged with multiple myomas.  Measurements include 54 mm, 40 mm, 38 mm, 36 mm and 22 mm.  Endometrial echo measuring 28.2 mm noting a submucous component to 1 of her myomas.  Right and left ovaries visualized and normal.  Cul-de-sac negative  Assessment/Plan:  42 y.o. G0P0000 with history as above.  She has an appointment at [facility] for management consultation as the patient strongly desires hysterectomy.  Received her second shot of Depo-Lupron today.  We also discussed her recent Pap smear which was normal with the exception of showing trichomonas.  I discussed the potential sexually transmitted nature of this and the need to have her partners treated.  Also discussed screening for other STDs to include GC and chlamydia.  Patient can follow-up after completing her Flagyl for reexam and we will plan on screening her at that time.    Anastasio Auerbach MD, 10:01 AM 02/09/2018

## 2018-02-10 ENCOUNTER — Other Ambulatory Visit: Payer: Self-pay | Admitting: *Deleted

## 2018-02-10 DIAGNOSIS — R519 Headache, unspecified: Secondary | ICD-10-CM

## 2018-02-10 DIAGNOSIS — R51 Headache: Principal | ICD-10-CM

## 2018-02-10 NOTE — Telephone Encounter (Signed)
Referral placed for [place]

## 2018-02-13 ENCOUNTER — Telehealth: Payer: Self-pay

## 2018-02-13 NOTE — Telephone Encounter (Signed)
Left voice message lab results normal per Dr. Burr Medico, no concerns.

## 2018-02-13 NOTE — Telephone Encounter (Signed)
-----   Message from Truitt Merle, MD sent at 02/12/2018 10:51 PM EDT ----- Please let pt know her lab result, normal CBC, no concerns, thanks   Truitt Merle  02/12/2018

## 2018-02-15 ENCOUNTER — Encounter: Payer: Self-pay | Admitting: Family Medicine

## 2018-02-15 ENCOUNTER — Other Ambulatory Visit (HOSPITAL_COMMUNITY)
Admission: RE | Admit: 2018-02-15 | Discharge: 2018-02-15 | Disposition: A | Discharge status: Home / Self Care | Payer: 59 | Source: Ambulatory Visit | Attending: Family Medicine | Admitting: Family Medicine

## 2018-02-15 ENCOUNTER — Ambulatory Visit (INDEPENDENT_AMBULATORY_CARE_PROVIDER_SITE_OTHER): Payer: 59 | Admitting: Family Medicine

## 2018-02-15 ENCOUNTER — Telehealth: Payer: Self-pay

## 2018-02-15 VITALS — BP 122/78 | HR 79 | Temp 98.8°F | Resp 12 | Ht 70.0 in | Wt 312.0 lb

## 2018-02-15 DIAGNOSIS — M797 Fibromyalgia: Secondary | ICD-10-CM

## 2018-02-15 DIAGNOSIS — E538 Deficiency of other specified B group vitamins: Secondary | ICD-10-CM

## 2018-02-15 DIAGNOSIS — Z113 Encounter for screening for infections with a predominantly sexual mode of transmission: Secondary | ICD-10-CM

## 2018-02-15 DIAGNOSIS — G894 Chronic pain syndrome: Secondary | ICD-10-CM

## 2018-02-15 DIAGNOSIS — R7303 Prediabetes: Secondary | ICD-10-CM | POA: Diagnosis not present

## 2018-02-15 DIAGNOSIS — Z23 Encounter for immunization: Secondary | ICD-10-CM

## 2018-02-15 DIAGNOSIS — E559 Vitamin D deficiency, unspecified: Secondary | ICD-10-CM | POA: Diagnosis not present

## 2018-02-15 DIAGNOSIS — Z0000 Encounter for general adult medical examination without abnormal findings: Secondary | ICD-10-CM | POA: Diagnosis not present

## 2018-02-15 DIAGNOSIS — E785 Hyperlipidemia, unspecified: Secondary | ICD-10-CM | POA: Diagnosis not present

## 2018-02-15 DIAGNOSIS — Z114 Encounter for screening for human immunodeficiency virus [HIV]: Secondary | ICD-10-CM | POA: Diagnosis not present

## 2018-02-15 LAB — LIPID PANEL
CHOLESTEROL: 184 mg/dL (ref 0–200)
HDL: 45.7 mg/dL (ref >39.00)
LDL CALC: 123 mg/dL — AB (ref 0–99)
NONHDL: 138.41
Total CHOL/HDL Ratio: 4
Triglycerides: 78 mg/dL (ref 0.0–149.0)
VLDL: 15.6 mg/dL (ref 0.0–40.0)

## 2018-02-15 LAB — VITAMIN B12: VITAMIN B 12: 201 pg/mL — AB (ref 211–911)

## 2018-02-15 LAB — HEMOGLOBIN A1C: Hgb A1c MFr Bld: 5.6 % (ref 4.6–6.5)

## 2018-02-15 LAB — VITAMIN D 25 HYDROXY (VIT D DEFICIENCY, FRACTURES): VITD: 29.99 ng/mL — ABNORMAL LOW (ref 30.00–100.00)

## 2018-02-15 MED ORDER — DULOXETINE HCL 30 MG PO CPEP: 30.0000 mg | ORAL_CAPSULE | Freq: Every day | ORAL | 2 refills | Status: DC

## 2018-02-15 NOTE — Telephone Encounter (Signed)
Patient calls stating she will have a hysterectomy soon, wants to know if she should go off Eliquis and go on Lovenox?  What is Dr. Ernestina Penna recommended and for how long?

## 2018-02-15 NOTE — Assessment & Plan Note (Signed)
Recommend continuing healthy lifestyle for primary prevention. Further recommendations will be given according to A1c results.

## 2018-02-15 NOTE — Progress Notes (Signed)
HPI:   Holly Mcdonald is a 42 y.o. female, who is here today for her routine physical.  Last CPE: 02/25/17  Regular exercise 3 or more time per week: Walking daily for 15 to 30 minutes. Following a healthy diet: Yes.  She lives alone, she has family members living around the area.  Chronic medical problems: Fibromyalgia, anxiety, prediabetes, history of pulmonary embolism, asthma, obesity among some.  Urine incontinence, she follows with urologist.  Pap smear 02/2018. She follows with Dr. Phineas Real.last gynecologic preventive visit on 02/03/2018. Hx of heavy menses, waiting for hysterectomy. She was referred to [facility] and evaluation is pending.  Hx of STD's: Currently she is on treatment for trichomonas.  Immunization History  Administered Date(s) Administered  . Influenza,inj,Quad PF,6+ Mos 01/25/2017  . Pneumococcal Polysaccharide-23 11/04/2016    Mammogram: 02/2017. Birads 1.   Chronic pain of fibromyalgia, she follows with Dr. Posey Pronto (chronic pain management). History of headaches, she has been referred to a different neurologist per request. She follows with rheumatologist, she states that she was diagnosed with "early stage of autoimmune disease", connective tissue disease and inflammatory arthritis.  Currently she is on prednisone ? mg once daily and on Plaquenil.  She follows with Dr. Annamaria Boots for iron deficiency anemia and history of pulmonary embolism. Anticoagulation therapy was complicated with vitreal hemorrhage, she follows with ophthalmologist.  Asthma: She follows with Dr. Elsworth Soho.  She has also seen cardiologist due to concerns about chest pain, Dr. Ellyn Hack.  Fibromyalgia: Currently she is on Cymbalta 60 mg daily. Since her last visit she has been started on Lyrica 75 mg 3 times per day. She is not sure if Cymbalta 60 mg is helping, she denies side effects.   Lab Results  Component Value Date   HGBA1C 6.0 07/06/2017    Lab Results   Component Value Date   CHOL 179 02/25/2017   HDL 33.30 (L) 02/25/2017   LDLCALC 119 (H) 02/25/2017   TRIG 135.0 02/25/2017   CHOLHDL 5 02/25/2017   Vitamin D deficiency: 25 OH vitamin D was mildly low in 07/2017, 27.3. She is on ergocalciferol 50,000 units every 2 weeks.  She thinks she needs to take ergocalciferol more often.  Vitamin B12 deficiency: Currently she is on OTC B12?  Micrograms daily. She would like to get B12 injections.  According to patient, neurologist diagnosed her with "very low B12."  She has not been started on injectable B12 supplementation.  Lab Results  Component Value Date   VITAMINB12 214 08/03/2017   Lab Results  Component Value Date   WBC 8.3 02/09/2018   HGB 12.4 02/09/2018   HCT 37.1 02/09/2018   MCV 86.1 02/09/2018   PLT 234 02/09/2018   She is also requesting screening for STDs.   Review of Systems  Constitutional: Positive for fatigue. Negative for appetite change and fever.  HENT: Negative for dental problem, hearing loss, mouth sores, sore throat, trouble swallowing and voice change.   Eyes: Negative for redness and visual disturbance.  Respiratory: Negative for cough, shortness of breath and wheezing.   Cardiovascular: Negative for chest pain and leg swelling.  Gastrointestinal: Negative for abdominal pain, nausea and vomiting.       No changes in bowel habits.  Endocrine: Negative for cold intolerance, heat intolerance, polydipsia, polyphagia and polyuria.  Genitourinary: Negative for decreased urine volume, dysuria, hematuria, vaginal bleeding and vaginal discharge.  Musculoskeletal: Positive for arthralgias, back pain and myalgias. Negative for gait problem.  Skin:  Negative for color change and rash.  Allergic/Immunologic: Positive for environmental allergies.  Neurological: Negative for syncope, weakness and headaches.  Hematological: Negative for adenopathy. Does not bruise/bleed easily.  Psychiatric/Behavioral: Negative for  confusion and sleep disturbance. The patient is nervous/anxious.   All other systems reviewed and are negative.     Current Outpatient Medications on File Prior to Visit  Medication Sig Dispense Refill  . ADVAIR DISKUS 250-50 MCG/DOSE AEPB INHALE 1 PUFF INTO THE LUNGS TWICE DAILY (Patient taking differently: INHALE 1 PUFF INTO THE LUNGS TWICE DAILY--- per pt as needed) 60 each 6  . albuterol (PROVENTIL HFA;VENTOLIN HFA) 108 (90 Base) MCG/ACT inhaler Inhale 1-2 puffs into the lungs every 6 (six) hours as needed for wheezing or shortness of breath. 18 g 2  . albuterol (PROVENTIL) (2.5 MG/3ML) 0.083% nebulizer solution Take 3 mLs (2.5 mg total) by nebulization every 6 (six) hours as needed for wheezing or shortness of breath. 360 mL 5  . aspirin EC 81 MG tablet Take 81 mg by mouth daily.    . baclofen (LIORESAL) 10 MG tablet TAKE 1 TABLET BY MOUTH THREE TIMES DAILY AS NEEDED FOR MUSCLE SPASMS 90 tablet 5  . benzonatate (TESSALON) 200 MG capsule Take 1 capsule (200 mg total) by mouth 3 (three) times daily as needed for cough. 30 capsule 1  . cetirizine (ZYRTEC) 10 MG tablet Take 10 mg by mouth every morning.     . Cyanocobalamin (VITAMIN B 12 PO) Take 1,000 mg by mouth daily.     . Ferrous Sulfate (IRON) 325 (65 Fe) MG TABS Take 3 tablets by mouth every morning.    . fluticasone (FLONASE) 50 MCG/ACT nasal spray SHAKE LIQUID AND USE 1 SPRAY IN EACH NOSTRIL DAILY (Patient taking differently: SHAKE LIQUID AND USE 1 SPRAY IN EACH NOSTRIL DAILY--  in am) 16 g 2  . hydroxychloroquine (PLAQUENIL) 200 MG tablet Take 200 mg by mouth daily.    . megestrol (MEGACE) 40 MG tablet Take 1 tablet (40 mg total) by mouth 2 (two) times daily. As needed for bleeding 60 tablet 1  . methocarbamol (ROBAXIN) 500 MG tablet Take 1 tablet (500 mg total) by mouth 2 (two) times daily as needed for muscle spasms. 60 tablet 1  . metroNIDAZOLE (FLAGYL) 500 MG tablet Take 1 tablet (500 mg total) by mouth 2 (two) times daily. 14  tablet 0  . montelukast (SINGULAIR) 10 MG tablet Take 1 tablet (10 mg total) by mouth at bedtime. 30 tablet 0  . omeprazole (PRILOSEC) 40 MG capsule TAKE 1 CAPSULE(40 MG) BY MOUTH DAILY (Patient taking differently: TAKE 1 CAPSULE(40 MG) BY MOUTH DAILY--- takes in am) 30 capsule 0  . oxybutynin (DITROPAN) 5 MG tablet Take 1 tablet by mouth 2 (two) times daily.  11  . PREDNISONE PO Take by mouth.    . pregabalin (LYRICA) 75 MG capsule Take 1 capsule (75 mg total) by mouth 3 (three) times daily. 90 capsule 1  . timolol (TIMOPTIC) 0.5 % ophthalmic solution Place 1 drop into both eyes every morning.    . topiramate (TOPAMAX) 25 MG tablet TK 3 TS PO D----- per pt takes 3 tablets qhs  1  . Vitamin D, Ergocalciferol, (DRISDOL) 50000 units CAPS capsule TAKE 1 CAPSULE BY MOUTH WEEKLY FOR 8 WEEKS, THEN EVERY 2 WEEKS (Patient taking differently: Take 50,000 Units by mouth every 7 (seven) days. TAKE 1 CAPSULE BY MOUTH WEEKLY FOR 8 WEEKS, THEN EVERY 2 WEEKS) 12 capsule 0   No  current facility-administered medications on file prior to visit.      Past Medical History:  Diagnosis Date  . Anticoagulated    eliquis  . Arthritis   . Bladder spasm   . Fibromyalgia   . GERD (gastroesophageal reflux disease)   . Heavy menstrual bleeding   . History of chronic bronchitis   . History of pulmonary embolism 10/2016--- followed by pcp and pulmoloigst   dx bilateral PE , bilateral lower lobes , segmental , unprovoked, negative hypercoagulable work-up-- treated w/ eliquis  . Iron deficiency anemia   . Migraine   . Mild intermittent asthma    pulmologist-  dr Elsworth Soho  . Morbid obesity with BMI of 45.0-49.9, adult ([place])   . Open-angle glaucoma of both eyes   . Pre-diabetes   . 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 Urgency of urination   . Uterine fibroid   . Vitamin D deficiency   . Wears glasses     Past Surgical History:  Procedure Laterality Date  . ANAL EXAMINATION UNDER  ANESTHESIA  age 34   w/ removal cyst  . DILATATION & CURETTAGE/HYSTEROSCOPY WITH MYOSURE N/A 10/17/2017   Procedure: [place];  Surgeon: Anastasio Auerbach, MD;  Location: [facility];  Service: Gynecology;  Laterality: N/A;  . GYNECOLOGIC CRYOSURGERY  age 71s  . TONSILLECTOMY    . TRANSTHORACIC ECHOCARDIOGRAM  01/17/2017   ef 60-65%/  mild TR    Allergies  Allergen Reactions  . Other Nausea And Vomiting    Darvocet N-100  . Darvon [Propoxyphene]     Family History  Problem Relation Age of Onset  . Pulmonary embolism Mother        Last year  . Mental retardation Mother        PTSD and fibromyalgia  . Heart disease Mother        CHF  . Congestive Heart Failure Mother   . Heart disease Father 75       CAD  . Hyperlipidemia Father   . Diabetes Paternal Grandmother   . Heart attack Paternal Grandmother   . Congestive Heart Failure Maternal Aunt   . Cancer Neg Hx     Social History   Socioeconomic History  . Marital status: Divorced    Spouse name: Not on file  . Number of children: Not on file  . Years of education: Not on file  . Highest education level: Not on file  Occupational History  . Occupation: representative    Comment: [employer]  Social Needs  . Financial resource strain: Not on file  . Food insecurity:    Worry: Not on file    Inability: Not on file  . Transportation needs:    Medical: Not on file    Non-medical: Not on file  Tobacco Use  . Smoking status: Never Smoker  . Smokeless tobacco: Never Used  Substance and Sexual Activity  . Alcohol use: Yes    Comment: occasionally  . Drug use: No  . Sexual activity: Yes    Birth control/protection: None    Comment: 1st intercourse 42 yo-More than 5 partners    Lifestyle  . Physical activity:    Days per week: Not on file    Minutes per session: Not on file  . Stress: Not on file  Relationships  . Social connections:    Talks on phone: Not on  file    Gets together: Not on file  Attends religious service: Not on file    Active member of club or organization: Not on file    Attends meetings of clubs or organizations: Not on file    Relationship status: Not on file  Other Topics Concern  . Not on file  Social History Narrative  . Not on file     Vitals:   02/15/18 0924  BP: 122/78  Pulse: 79  Resp: 12  Temp: 98.8 F (37.1 C)  SpO2: 99%   Body mass index is 44.77 kg/m.   Wt Readings from Last 3 Encounters:  02/15/18 (!) 312 lb (141.5 kg)  02/08/18 (!) 312 lb (141.5 kg)  02/03/18 (!) 308 lb (139.7 kg)    Physical Exam  Nursing note and vitals reviewed. Constitutional: She is oriented to person, place, and time. She appears well-developed. No distress.  HENT:  Head: Normocephalic and atraumatic.  Right Ear: Hearing, tympanic membrane, external ear and ear canal normal.  Left Ear: Hearing, tympanic membrane, external ear and ear canal normal.  Mouth/Throat: Uvula is midline, oropharynx is clear and moist and mucous membranes are normal.  Eyes: Pupils are equal, round, and reactive to light. Conjunctivae and EOM are normal.  Neck: No tracheal deviation present. No thyromegaly present.  Cardiovascular: Normal rate and regular rhythm.  No murmur heard. Pulses:      Dorsalis pedis pulses are 2+ on the right side, and 2+ on the left side.  Respiratory: Effort normal and breath sounds normal. No respiratory distress.  GI: Soft. She exhibits no mass. There is no hepatomegaly. There is no tenderness.  Genitourinary:  Genitourinary Comments: Deferred to gyn.  Musculoskeletal: She exhibits no edema.  No major deformity or signs of synovitis appreciated.  Lymphadenopathy:    She has no cervical adenopathy.       Right: No supraclavicular adenopathy present.       Left: No supraclavicular adenopathy present.  Neurological: She is alert and oriented to person, place, and time. She has normal strength. No cranial  nerve deficit. Coordination and gait normal.  Reflex Scores:      Bicep reflexes are 2+ on the right side and 2+ on the left side.      Patellar reflexes are 2+ on the right side and 2+ on the left side. Skin: Skin is warm. No rash noted. No erythema.  Psychiatric: Her affect is blunt.  Well groomed, poor eye contact.      ASSESSMENT AND PLAN:  Holly Mcdonald was here today annual physical examination.  Orders Placed This Encounter  Procedures  . Flu Vaccine QUAD 36+ mos IM  . Hemoglobin A1c  . Lipid panel  . HIV antibody  . Vitamin B12  . VITAMIN D 25 Hydroxy (Vit-D Deficiency, Fractures)  . RPR   Lab Results  Component Value Date   HGBA1C 5.6 02/15/2018   Lab Results  Component Value Date   CHOL 184 02/15/2018   HDL 45.70 02/15/2018   LDLCALC 123 (H) 02/15/2018   TRIG 78.0 02/15/2018   CHOLHDL 4 02/15/2018    Lab Results  Component Value Date   VITAMINB12 201 (L) 02/15/2018    Routine general medical examination at a health care facility  We discussed the importance of regular physical activity and healthy diet for prevention of chronic illness and/or complications. Preventive guidelines reviewed. Vaccination up to date, influenza vaccine given today. She will continue following with her gyn for her female preventive care. Next CPE in a year.  The  10-year ASCVD risk score Mikey Bussing DC Brooke Bonito., et al., 2013) is: 0.7%   Values used to calculate the score:     Age: 19 years     Sex: Female     Is Non-Hispanic African American: Yes     Diabetic: No     Tobacco smoker: No     Systolic Blood Pressure: 601 mmHg     Is BP treated: No     HDL Cholesterol: 45.7 mg/dL     Total Cholesterol: 184 mg/dL  Encounter for screening for HIV -     HIV antibody  Screen for STD (sexually transmitted disease) -     Urine cytology ancillary only -     RPR Need for immunization against influenza -     Flu Vaccine QUAD 36+ mos IM   Pre-diabetes Recommend  continuing healthy lifestyle for primary prevention. Further recommendations will be given according to A1c results.  Fibromyalgia muscle pain She will continue following with rheumatologist. Because she is not sure about benefits from Cymbalta, she agrees with decreasing dose of Cymbalta from 60 mg to 30 mg.  She was instructed to let me know in 2 to 3 months if she has noted any difference, then with we will plan on continuing weaning medication. Continue Lyrica 75 mg 3 times daily. Good sleep hygiene. Low impact regular physical activity.  Hyperlipidemia She will continue nonpharmacologic treatment, low-fat diet. Further recommendation will be given according to lipid panel results as well as 10 years CVD risk.  Vitamin D deficiency, unspecified No changes in current management, will follow labs done today and will give further recommendations accordingly.   B12 deficiency Last B12 in 07/2017 was normal lower range. For now she will continue oral OTC B12 daily. Further recommendation will be given according to B12 results.  Chronic pain syndrome In general she feels like she is doing better. She is able to exercise regularly. She will continue following with Dr. Posey Pronto.    Return in 1 year (on 02/16/2019) for CPE.      Juanluis Guastella G. Martinique, MD  [facility]. [place] office.

## 2018-02-15 NOTE — Assessment & Plan Note (Signed)
Last B12 in 07/2017 was normal lower range. For now she will continue oral OTC B12 daily. Further recommendation will be given according to B12 results.

## 2018-02-15 NOTE — Assessment & Plan Note (Signed)
She will continue nonpharmacologic treatment, low-fat diet. Further recommendation will be given according to lipid panel results as well as 10 years CVD risk.

## 2018-02-15 NOTE — Patient Instructions (Addendum)
A few things to remember from today's visit:   Routine general medical examination at a health care facility  Pre-diabetes - Plan: Hemoglobin A1c  Fibromyalgia muscle pain - Plan: DULoxetine (CYMBALTA) 30 MG capsule  Vitamin D deficiency, unspecified - Plan: VITAMIN D 25 Hydroxy (Vit-D Deficiency, Fractures)  Hyperlipidemia, unspecified hyperlipidemia type - Plan: Lipid panel  Encounter for screening for HIV - Plan: HIV antibody  Screen for STD (sexually transmitted disease) - Plan: Urine cytology ancillary only, RPR  B12 deficiency - Plan: Vitamin B12  Today you have you routine preventive visit and to follow on some of your chronic med problems.  Fibromyalgia: We are decreasing Cymbalta dose from 60 mg to 30 mg.  Please let me know in 2 to 3 months how you are doing with the lower dose, then we can decide about continue weaning medication off. Continue following with rheumatologist and with pain clinic for your fibromyalgia and chronic pain.   At least 150 minutes of moderate exercise per week, daily brisk walking for 15-30 min is a good exercise option. Healthy diet low in saturated (animal) fats and sweets and consisting of fresh fruits and vegetables, lean meats such as fish and white chicken and whole grains.  These are some of recommendations for screening depending of age and risk factors:   - Vaccines:  Tdap vaccine every 10 years.  Shingles vaccine recommended at age 32, could be given after 41 years of age but not sure about insurance coverage.   Pneumonia vaccines:  Prevnar 13 at 65 and Pneumovax at 46. Sometimes Pneumovax is giving earlier if history of smoking, lung disease,diabetes,kidney disease among some.    Screening for diabetes annually.  Cervical cancer prevention:  Pap smear starts at 41 years of age and continues periodically until 41 years old in low risk women. Pap smear every 3 years between 85 and 21 years old. Pap smear every 3-5 years  between women 69 and older if pap smear negative and HPV screening negative.   -Breast cancer: Mammogram: There is disagreement between experts about when to start screening in low risk asymptomatic female but recent recommendations are to start screening at 14 and not later than 41 years old , every 1-2 years and after 41 yo q 2 years. Screening is recommended until 41 years old but some women can continue screening depending of healthy issues.   Colon cancer screening: starts at 41 years old until 41 years old.   Also recommended:  1. Dental visit- Brush and floss your teeth twice daily; visit your dentist twice a year. 2. Eye doctor- Get an eye exam at least every 2 years. 3. Helmet use- Always wear a helmet when riding a bicycle, motorcycle, rollerblading or skateboarding. 4. Safe sex- If you may be exposed to sexually transmitted infections, use a condom. 5. Seat belts- Seat belts can save your live; always wear one. 6. Smoke/Carbon Monoxide detectors- These detectors need to be installed on the appropriate level of your home. Replace batteries at least once a year. 7. Skin cancer- When out in the sun please cover up and use sunscreen 15 SPF or higher. 8. Violence- If anyone is threatening or hurting you, please tell your healthcare provider.  9. Drink alcohol in moderation- Limit alcohol intake to one drink or less per day. Never drink and drive.  Please be sure medication list is accurate. If a new problem present, please set up appointment sooner than planned today.

## 2018-02-15 NOTE — Assessment & Plan Note (Signed)
In general she feels like she is doing better. She is able to exercise regularly. She will continue following with Dr. Posey Pronto.

## 2018-02-15 NOTE — Assessment & Plan Note (Signed)
She will continue following with rheumatologist. Because she is not sure about benefits from Cymbalta, she agrees with decreasing dose of Cymbalta from 60 mg to 30 mg.  She was instructed to let me know in 2 to 3 months if she has noted any difference, then with we will plan on continuing weaning medication. Continue Lyrica 75 mg 3 times daily. Good sleep hygiene. Low impact regular physical activity.

## 2018-02-15 NOTE — Assessment & Plan Note (Signed)
No changes in current management, will follow labs done today and will give further recommendations accordingly.  

## 2018-02-15 NOTE — Telephone Encounter (Signed)
I called her back, she has been off Eliquis, and currently on baby aspirin.  I saw her last time in August 2019.  We discussed the increased risk of thrombosis after hysterectomy, and I recommend her to start Eliquis 5mg  q12h 2-3 days after her hysterectomy, if her risk of bleeding from surgery is minimal by then per her gynecologist.  I recommend her to stay on Eliquis for 1 to 2 months, and she will call me after her surgery to schedule appointment with me.  She voiced good understanding, and agrees with the plan.  Truitt Merle MD

## 2018-02-16 LAB — URINE CYTOLOGY ANCILLARY ONLY
Chlamydia: NEGATIVE
Neisseria Gonorrhea: NEGATIVE

## 2018-02-16 LAB — RPR: RPR Ser Ql: NONREACTIVE

## 2018-02-16 LAB — HIV ANTIBODY (ROUTINE TESTING W REFLEX): HIV 1&2 Ab, 4th Generation: NONREACTIVE

## 2018-02-19 ENCOUNTER — Encounter: Payer: Self-pay | Admitting: Family Medicine

## 2018-02-19 MED ORDER — VITAMIN D (ERGOCALCIFEROL) 1.25 MG (50000 UNIT) PO CAPS: ORAL_CAPSULE | ORAL | 1 refills | Status: DC

## 2018-03-04 LAB — HM MAMMOGRAPHY

## 2018-03-05 ENCOUNTER — Encounter: Payer: Self-pay | Admitting: Gynecology

## 2018-03-06 NOTE — Telephone Encounter (Signed)
I think that is fine and I do not need to see her if she was tested elsewhere.

## 2018-03-06 NOTE — Telephone Encounter (Signed)
Dr. Phineas Real this message is follow up from pap result "Tell patient that her Pap smear was normal but did show evidence of a trichomonas infection. These can be sometimes sexually transmitted. I would recommend Flagyl 500 mg twice daily x7 days. Avoid alcohol while taking. Her partner should also be treated by his doctor. I would recommend a follow-up exam for recheck in a month or so. We could do other studies such as GC and chlamydia screen if she would think that would be appropriate."  She is scheduled to come back for follow up on 03/17/18. Dr. Martinique is in epic.

## 2018-03-13 ENCOUNTER — Encounter: Payer: Self-pay | Admitting: Family Medicine

## 2018-03-17 ENCOUNTER — Ambulatory Visit: Payer: 59 | Admitting: Gynecology

## 2018-03-24 ENCOUNTER — Telehealth: Payer: Self-pay

## 2018-03-24 DIAGNOSIS — Z86711 Personal history of pulmonary embolism: Secondary | ICD-10-CM | POA: Insufficient documentation

## 2018-03-24 DIAGNOSIS — M359 Systemic involvement of connective tissue, unspecified: Secondary | ICD-10-CM | POA: Insufficient documentation

## 2018-03-24 DIAGNOSIS — D219 Benign neoplasm of connective and other soft tissue, unspecified: Secondary | ICD-10-CM | POA: Insufficient documentation

## 2018-03-24 NOTE — Telephone Encounter (Signed)
Patient called and said that she had her visit at [facility] and that Dr. Rockey Situ her that Hysterectomy is too risk with her health issues.  Patient said she recommended abd MRI, another hysteroscopy and an IUD.   Patient wants to get things scheduled by end of year with you.

## 2018-03-27 NOTE — Telephone Encounter (Signed)
Tell patient I reviewed the note from the other physician.  One option would be to repeat the hysteroscopy and see if we can resect any remaining abnormalities in the cavity and then place a Mirena IUD.  I could do this intraoperatively.  The MRI would be if they were anticipating uterine artery embolization for her leiomyoma.

## 2018-03-28 NOTE — Telephone Encounter (Signed)
Called patient back and per [facility] access note on file left message that I scheduled her surgery for 04/11/18 at 9:45am I told her she will need to schedule pre op appointment with Dr. Loetta Rough and she can either call me or the appointment desk to arrange that. No prepymt due as her ins is paying at 100%.  I will mail her a [facility] pamphlet.

## 2018-03-28 NOTE — Telephone Encounter (Addendum)
Patient called back in voice mail and said she does want to proceed with Hysteroscopy.  I have time on Dec 3rd and she wants me to schedule her there. I will use hysteroscopy order from June if okay.  Let me know if anything additional.  Cytotec?

## 2018-03-28 NOTE — Telephone Encounter (Signed)
Called and left detailed message in voice mail per [facility] access note on file. I read her Dr. Dorette Grate note except I did relay that she would return to the office after surgery to have the [place] IUD placed. Her ins co will not cover her having it placed at time of surgery. Okayed with Dr. Loetta Rough.  Asked her to call me to discuss possible date.

## 2018-03-30 ENCOUNTER — Telehealth: Payer: Self-pay

## 2018-03-30 MED ORDER — ENOXAPARIN SODIUM 40 MG/0.4ML ~~LOC~~ SOLN: 40.0000 mg | SUBCUTANEOUS | 0 refills | Status: DC

## 2018-03-30 NOTE — Telephone Encounter (Signed)
I received the following message from Dr. Loetta Rough: "I want her to be on Lovenox 40 mg subcu for 7 days postop. That will need to be called into the pharmacy. Also for the office to arrange for the Mirena IUD to be placed 1 week postop."  I called patient and read this note. She understood it all. I sent the Lovenox Rx to her pharmacy and reviewed instructions with her prior.  Abigail Butts was informed and will check Mirena benefit with ins. Santiago Bur spoke with patient and scheduled her for one week post op to have the Mirena IUD inserted.

## 2018-03-31 ENCOUNTER — Other Ambulatory Visit: Payer: Self-pay | Admitting: Family Medicine

## 2018-03-31 DIAGNOSIS — M791 Myalgia, unspecified site: Secondary | ICD-10-CM

## 2018-04-04 ENCOUNTER — Telehealth: Payer: Self-pay

## 2018-04-04 ENCOUNTER — Encounter (HOSPITAL_BASED_OUTPATIENT_CLINIC_OR_DEPARTMENT_OTHER): Payer: Self-pay | Admitting: *Deleted

## 2018-04-04 ENCOUNTER — Ambulatory Visit (INDEPENDENT_AMBULATORY_CARE_PROVIDER_SITE_OTHER): Payer: 59 | Admitting: Gynecology

## 2018-04-04 ENCOUNTER — Encounter: Payer: Self-pay | Admitting: Gynecology

## 2018-04-04 ENCOUNTER — Other Ambulatory Visit: Payer: Self-pay

## 2018-04-04 VITALS — BP 130/84

## 2018-04-04 DIAGNOSIS — N921 Excessive and frequent menstruation with irregular cycle: Secondary | ICD-10-CM | POA: Diagnosis not present

## 2018-04-04 NOTE — Progress Notes (Signed)
Spoke with Holly Mcdonald  Npo after midnight arrive 745 am 04-11-18 [facility] meds to take sip of water: advair prn and albuterol inhaler prn and bring inhalers, albuterol nebulizer prn, lyrica, certrizine, prednisone, flonase nasal spray, timoptic eye drop, omeprazole, oxybutynin  Records on chart/epic, lov pulmonary 11-18-17, chest xray 11-18-17 Scheduled for lab appointment 04-05-18 900 am for cbc, cmet and serum pregnancy Driver mother [name] Has surgery orders in epic

## 2018-04-04 NOTE — Telephone Encounter (Signed)
I received the following staff message from Dr. Loetta Rough: "Ask Holly Mcdonald to have available the right angle resectoscopic loop set up as well as the cautery probe that can be used with MyoSure. I plan to start with Myosure but may need to switch to the right ankle resectoscopic loop if residual myoma is present and hard to resect. I also would like the cautery probes that they have that fits down the MyoSure port available for bleeding."  I called and spoke with Holly Mcdonald in [place] and added the equipment in special needs to read "requests right angle resectoscopic loop and cautery probe that can be used with Myosure that fits down the Myosure port."

## 2018-04-04 NOTE — Patient Instructions (Signed)
Followup for surgery as scheduled. 

## 2018-04-04 NOTE — Progress Notes (Signed)
Holly Mcdonald [birth year] [id number]        42 y.o.  G0P0000 presents for her preoperative visit for upcoming hysteroscopy resection of submucous myoma.  Patient has a history of menorrhagia and multiple leiomyoma. She underwent hysteroscopic resection of a submucous myoma in June with resection of the majority of the myoma.  Had been on Eliquis for bilateral pulmonary emboli 10/2016.  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  Also recently diagnosed with autoimmune disorder.  She had a trial of Mirena IUD which was spontaneously extruded.  She continued to have heavy irregular periods despite the submucous myomectomy and was placed on Depo-Lupron having recently received her injection in October and uses Megace to control her bleeding.  Ultrasound 02/2018 showed overall uterine length 11 cm with multiple myomas measured at 54 mm, 40 mm, 38 mm, 36 mm and 22 mm.  Her endometrial echo was thickened at 28 mm with a submucous component of 1 of her myomas.  Her ovaries were visualized and normal.  The patient had consultation at [facility] for consideration of hysterectomy.  She saw Dr. Otho Mcdonald who recommended retrial of submucous myoma resection and placement of Mirena IUD following this.  She outlined the risks to include morbid obesity, history of pulmonary emboli, retinal hemorrhages, recent autoimmune disorder diagnoses and at this point felt risks outweigh the benefits and that more conservative attempts should be entertained.  The patient is going to proceed with hysteroscopic resection of submucous myoma and then subsequently have a Mirena IUD placed.  Past medical history,surgical history, problem list, medications, allergies, family history and social history were all reviewed and documented in the EPIC chart.  Directed ROS with pertinent positives and negatives documented in the history of present illness/assessment and plan.  Exam: Holly Mcdonald assistant Vitals:   04/04/18 1139  BP: 130/84   General appearance:  Normal HEENT normal Lungs clear Cardiac regular rate no rubs murmurs or gallops Abdomen soft nontender without gross masses Pelvic external BUS vagina normal.  Cervix normal.  Uterus mildly enlarged midline mobile nontender.  Adnexa difficult to evaluate due to abdominal girth.  Assessment/Plan:  42 y.o. G0P0000 with history as outlined above for hysteroscopy and resection of submucous myoma.  Will return postoperatively for Mirena IUD placement.  The patient earlier this year has undergone the same procedure and she is familiar with what to expect as well as the risks as discussed previously.  I again today reviewed the proposed surgery with the patient to include the expected intraoperative and postoperative courses as well as the recovery period. The use of the hysteroscope, resectoscope and the D&C portion were all discussed. The risks of surgery to include infection, prolonged antibiotics, hemorrhage necessitating transfusion and the risks of transfusion, including transfusion reaction, hepatitis, HIV, mad cow disease and other unknown entities were all discussed understood and accepted. The risk of damage to internal organs during the procedure, either immediately recognized or delay recognized, including vagina, cervix, uterus, possible perforation causing damage to bowel, bladder, ureters, vessels and nerves necessitating major exploratory reparative surgery and future reparative surgeries including bladder repair, ureteral damage repair, bowel resection, ostomy formation was also discussed understood and accepted.  We realistically reviewed that there are no guarantees that this will alleviate her heavy or irregular bleeding given the presence of the other myomas and the possibilities that I will not be able to resect all of the submucous myoma.  We also discussed her history of pulmonary embolus and she will  continue on her aspirin receive Lovenox  preoperatively and continue this for the week following the surgery.  She will then restart Eliquis at her other physicians recommendations and follow-up with them in reference to this.  The patient's questions were answered to her satisfaction and she is ready to proceed with surgery.    Holly Auerbach MD, 12:17 PM 04/04/2018

## 2018-04-04 NOTE — H&P (Signed)
Holly Mcdonald 22-Sep-1975 [id number]   History and Physical  Chief complaint: Leiomyoma, heavy irregular bleeding  History of present illness: 42 y.o. G0P0000 with a history of menorrhagia and multiple leiomyoma. She underwent hysteroscopic resection of a submucous myoma in June with resection of the majority of the myoma.  Had been on Eliquis for bilateral pulmonary emboli 10/2016.  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  Also recently diagnosed with autoimmune disorder.  She had a trial of Mirena IUD which was spontaneously extruded.  She continued to have heavy irregular periods despite the submucous myomectomy and was placed on Depo-Lupron having recently received her injection in October and uses Megace to control her bleeding.  Ultrasound 02/2018 showed overall uterine length 11 cm with multiple myomas measured at 54 mm, 40 mm, 38 mm, 36 mm and 22 mm.  Her endometrial echo was thickened at 28 mm with a submucous component of 1 of her myomas.  Her ovaries were visualized and normal.  The patient had consultation at [facility] for consideration of hysterectomy.  She saw Dr. Otho Perl who recommended retrial of submucous myoma resection and placement of Mirena IUD following this.  She outlined the risks to include morbid obesity, history of pulmonary emboli, retinal hemorrhages, recent autoimmune disorder diagnoses and at this point felt risks outweigh the benefits and that more conservative attempts should be entertained.  The patient is going to proceed with hysteroscopic resection of submucous myoma and then subsequently have a Mirena IUD placed.  Past Medical History:  Diagnosis Date  . Anticoagulated    eliquis  . Arthritis    inflammatory  . Bladder spasm   . Connective tissue disease ([place]) dx oct 2019  . Fibromyalgia   . GERD (gastroesophageal reflux disease)   . Heavy menstrual bleeding    resolved with megace  . History of chronic bronchitis    last  flare up 11-18-17  . History of pulmonary embolism 10/2016--- followed by pcp and pulmoloigst   dx bilateral PE , bilateral lower lobes , segmental , unprovoked, negative hypercoagulable work-up-- treated w/ eliquis  . Iron deficiency anemia   . Migraine   . Mild intermittent asthma    pulmologist-  dr Elsworth Soho  . Morbid obesity with BMI of 45.0-49.9, adult ([place])   . Open-angle glaucoma of both eyes   . Pre-diabetes   . 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 Urgency of urination   . Uterine fibroid   . Vitamin D deficiency   . Wears glasses     Past Surgical History:  Procedure Laterality Date  . ANAL EXAMINATION UNDER ANESTHESIA  age 37   w/ removal cyst  . DILATATION & CURETTAGE/HYSTEROSCOPY WITH MYOSURE N/A 10/17/2017   Procedure: [place];  Surgeon: Anastasio Auerbach, MD;  Location: [facility];  Service: Gynecology;  Laterality: N/A;  . GYNECOLOGIC CRYOSURGERY  age 26s  . TONSILLECTOMY  age 20  . TRANSTHORACIC ECHOCARDIOGRAM  01/17/2017   ef 60-65%/  mild TR    Family History  Problem Relation Age of Onset  . Pulmonary embolism Mother        Last year  . Mental retardation Mother        PTSD and fibromyalgia  . Heart disease Mother        CHF  . Congestive Heart Failure Mother   . Heart disease Father 63       CAD  . Hyperlipidemia Father   .  Diabetes Paternal Grandmother   . Heart attack Paternal Grandmother   . Congestive Heart Failure Maternal Aunt   . Cancer Neg Hx     Social History:  reports that she has never smoked. She has never used smokeless tobacco. She reports that she drinks alcohol. She reports that she does not use drugs.  Allergies:  Allergies  Allergen Reactions  . Other Nausea And Vomiting    Darvocet N-100  . Darvon [Propoxyphene]     Medications: See epic for most current list  ROS:  Was performed and pertinent positives and negatives are included in the  history of present illness.  Exam: [name] assistant Vitals:   04/04/18 1011  Weight: (!) 140.2 kg  Height: 5\' 10"  (1.778 m)   BP: 130/84   General appearance:  Normal HEENT normal Lungs clear Cardiac regular rate no rubs murmurs or gallops Abdomen soft nontender without gross masses Pelvic external BUS vagina normal.  Cervix normal.  Uterus mildly enlarged midline mobile nontender.  Adnexa difficult to evaluate due to abdominal girth.    Assessment/Plan:  42 y.o. G0P0000 with history as outlined above for hysteroscopy and resection of submucous myoma.  Will return postoperatively for Mirena IUD placement.  The patient earlier this year has undergone the same procedure and she is familiar with what to expect as well as the risks as discussed previously.  I again today reviewed the proposed surgery with the patient to include the expected intraoperative and postoperative courses as well as the recovery period. The use of the hysteroscope, resectoscope and the D&C portion were all discussed. The risks of surgery to include infection, prolonged antibiotics, hemorrhage necessitating transfusion and the risks of transfusion, including transfusion reaction, hepatitis, HIV, mad cow disease and other unknown entities were all discussed understood and accepted. The risk of damage to internal organs during the procedure, either immediately recognized or delay recognized, including vagina, cervix, uterus, possible perforation causing damage to bowel, bladder, ureters, vessels and nerves necessitating major exploratory reparative surgery and future reparative surgeries including bladder repair, ureteral damage repair, bowel resection, ostomy formation was also discussed understood and accepted.  We realistically reviewed that there are no guarantees that this will alleviate her heavy or irregular bleeding given the presence of the other myomas and the possibilities that I will not be able to resect all  of the submucous myoma.  We also discussed her history of pulmonary embolus and she will continue on her aspirin receive Lovenox preoperatively and continue this for the week following the surgery.  She will then restart Eliquis at her other physicians recommendations and follow-up with them in reference to this.  The patient's questions were answered to her satisfaction and she is ready to proceed with surgery.    Anastasio Auerbach MD, 12:33 PM 04/04/2018

## 2018-04-05 ENCOUNTER — Encounter (HOSPITAL_COMMUNITY)
Admission: RE | Admit: 2018-04-05 | Discharge: 2018-04-05 | Disposition: A | Discharge status: Home / Self Care | Payer: 59 | Source: Ambulatory Visit | Attending: Gynecology | Admitting: Gynecology

## 2018-04-05 DIAGNOSIS — K219 Gastro-esophageal reflux disease without esophagitis: Secondary | ICD-10-CM | POA: Diagnosis not present

## 2018-04-05 DIAGNOSIS — Z6841 Body Mass Index (BMI) 40.0 and over, adult: Secondary | ICD-10-CM | POA: Diagnosis not present

## 2018-04-05 DIAGNOSIS — R7303 Prediabetes: Secondary | ICD-10-CM | POA: Diagnosis not present

## 2018-04-05 DIAGNOSIS — Z86711 Personal history of pulmonary embolism: Secondary | ICD-10-CM | POA: Diagnosis not present

## 2018-04-05 DIAGNOSIS — G43909 Migraine, unspecified, not intractable, without status migrainosus: Secondary | ICD-10-CM | POA: Diagnosis not present

## 2018-04-05 DIAGNOSIS — D250 Submucous leiomyoma of uterus: Secondary | ICD-10-CM | POA: Diagnosis not present

## 2018-04-05 DIAGNOSIS — M797 Fibromyalgia: Secondary | ICD-10-CM | POA: Diagnosis not present

## 2018-04-05 DIAGNOSIS — Z7901 Long term (current) use of anticoagulants: Secondary | ICD-10-CM | POA: Diagnosis not present

## 2018-04-05 DIAGNOSIS — N920 Excessive and frequent menstruation with regular cycle: Secondary | ICD-10-CM | POA: Diagnosis not present

## 2018-04-05 DIAGNOSIS — M1990 Unspecified osteoarthritis, unspecified site: Secondary | ICD-10-CM | POA: Diagnosis not present

## 2018-04-05 DIAGNOSIS — Z8249 Family history of ischemic heart disease and other diseases of the circulatory system: Secondary | ICD-10-CM | POA: Diagnosis not present

## 2018-04-05 LAB — CBC
HCT: 35.9 % — ABNORMAL LOW (ref 36.0–46.0)
Hemoglobin: 11.4 g/dL — ABNORMAL LOW (ref 12.0–15.0)
MCH: 29 pg (ref 26.0–34.0)
MCHC: 31.8 g/dL (ref 30.0–36.0)
MCV: 91.3 fL (ref 80.0–100.0)
Platelets: 288 K/uL (ref 150–400)
RBC: 3.93 MIL/uL (ref 3.87–5.11)
RDW: 13.4 % (ref 11.5–15.5)
WBC: 7.3 K/uL (ref 4.0–10.5)
nRBC: 0 % (ref 0.0–0.2)

## 2018-04-05 LAB — COMPREHENSIVE METABOLIC PANEL WITH GFR
ALT: 13 U/L (ref 0–44)
AST: 16 U/L (ref 15–41)
Albumin: 4.1 g/dL (ref 3.5–5.0)
Alkaline Phosphatase: 47 U/L (ref 38–126)
Anion gap: 5 (ref 5–15)
BUN: 11 mg/dL (ref 6–20)
CO2: 21 mmol/L — ABNORMAL LOW (ref 22–32)
Calcium: 9 mg/dL (ref 8.9–10.3)
Chloride: 113 mmol/L — ABNORMAL HIGH (ref 98–111)
Creatinine, Ser: 0.75 mg/dL (ref 0.44–1.00)
GFR calc Af Amer: >60 mL/min
GFR calc non Af Amer: >60 mL/min
Glucose, Bld: 99 mg/dL (ref 70–99)
Potassium: 3.7 mmol/L (ref 3.5–5.1)
Sodium: 139 mmol/L (ref 135–145)
Total Bilirubin: 0.6 mg/dL (ref 0.3–1.2)
Total Protein: 7.1 g/dL (ref 6.5–8.1)

## 2018-04-05 LAB — HCG, SERUM, QUALITATIVE: Preg, Serum: NEGATIVE

## 2018-04-10 ENCOUNTER — Telehealth: Payer: Self-pay | Admitting: *Deleted

## 2018-04-10 ENCOUNTER — Other Ambulatory Visit: Payer: Self-pay | Admitting: Gynecology

## 2018-04-10 ENCOUNTER — Telehealth: Payer: Self-pay

## 2018-04-10 MED ORDER — MISOPROSTOL 200 MCG PO TABS: ORAL_TABLET | ORAL | 0 refills | Status: DC

## 2018-04-10 NOTE — Telephone Encounter (Signed)
Patient scheduled for [facility] tomorrow am, asked if Cytotec Rx should be sent to pharmacy to use tonight for surgery in am. Please advise

## 2018-04-10 NOTE — Telephone Encounter (Signed)
I called patient and left a message that I received her FMLA form. Reminded her that I will need a signed medical records release form before I can sent it. ALso, the form fee must be paid prior to sending. I asked her to let me know if she wants it completed just like last time she had this surgery in June. Asked her to call me with this info and to arrange.

## 2018-04-10 NOTE — Telephone Encounter (Signed)
Juliann Pulse called Dr. Phineas Real regarding the below and her did want her to have Cytotec 200 mcg 1tablet to use at bedtime. Rx sent. Juliann Pulse informed patient of this

## 2018-04-11 ENCOUNTER — Encounter (HOSPITAL_BASED_OUTPATIENT_CLINIC_OR_DEPARTMENT_OTHER)
Admission: RE | Disposition: A | Discharge status: Home / Self Care | Payer: Self-pay | Source: Ambulatory Visit | Attending: Gynecology

## 2018-04-11 ENCOUNTER — Other Ambulatory Visit: Payer: Self-pay | Admitting: Gynecology

## 2018-04-11 ENCOUNTER — Ambulatory Visit (HOSPITAL_BASED_OUTPATIENT_CLINIC_OR_DEPARTMENT_OTHER)
Admission: RE | Admit: 2018-04-11 | Discharge: 2018-04-11 | Disposition: A | Discharge status: Home / Self Care | Payer: 59 | Source: Ambulatory Visit | Attending: Gynecology | Admitting: Gynecology

## 2018-04-11 ENCOUNTER — Ambulatory Visit (HOSPITAL_BASED_OUTPATIENT_CLINIC_OR_DEPARTMENT_OTHER): Payer: 59 | Admitting: Anesthesiology

## 2018-04-11 ENCOUNTER — Encounter (HOSPITAL_BASED_OUTPATIENT_CLINIC_OR_DEPARTMENT_OTHER): Payer: Self-pay | Admitting: Anesthesiology

## 2018-04-11 DIAGNOSIS — D500 Iron deficiency anemia secondary to blood loss (chronic): Secondary | ICD-10-CM

## 2018-04-11 DIAGNOSIS — D250 Submucous leiomyoma of uterus: Secondary | ICD-10-CM | POA: Insufficient documentation

## 2018-04-11 DIAGNOSIS — D259 Leiomyoma of uterus, unspecified: Secondary | ICD-10-CM

## 2018-04-11 DIAGNOSIS — G43909 Migraine, unspecified, not intractable, without status migrainosus: Secondary | ICD-10-CM | POA: Insufficient documentation

## 2018-04-11 DIAGNOSIS — K219 Gastro-esophageal reflux disease without esophagitis: Secondary | ICD-10-CM | POA: Insufficient documentation

## 2018-04-11 DIAGNOSIS — N920 Excessive and frequent menstruation with regular cycle: Secondary | ICD-10-CM | POA: Insufficient documentation

## 2018-04-11 DIAGNOSIS — M1990 Unspecified osteoarthritis, unspecified site: Secondary | ICD-10-CM | POA: Insufficient documentation

## 2018-04-11 DIAGNOSIS — R7303 Prediabetes: Secondary | ICD-10-CM | POA: Insufficient documentation

## 2018-04-11 DIAGNOSIS — Z8249 Family history of ischemic heart disease and other diseases of the circulatory system: Secondary | ICD-10-CM | POA: Insufficient documentation

## 2018-04-11 DIAGNOSIS — Z6841 Body Mass Index (BMI) 40.0 and over, adult: Secondary | ICD-10-CM | POA: Insufficient documentation

## 2018-04-11 DIAGNOSIS — Z7901 Long term (current) use of anticoagulants: Secondary | ICD-10-CM | POA: Insufficient documentation

## 2018-04-11 DIAGNOSIS — Z86711 Personal history of pulmonary embolism: Secondary | ICD-10-CM | POA: Insufficient documentation

## 2018-04-11 DIAGNOSIS — M797 Fibromyalgia: Secondary | ICD-10-CM | POA: Insufficient documentation

## 2018-04-11 DIAGNOSIS — N946 Dysmenorrhea, unspecified: Secondary | ICD-10-CM

## 2018-04-11 HISTORY — DX: Systemic involvement of connective tissue, unspecified: M35.9

## 2018-04-11 HISTORY — PX: DILATATION & CURETTAGE/HYSTEROSCOPY WITH MYOSURE: SHX6511

## 2018-04-11 SURGERY — DILATATION & CURETTAGE/HYSTEROSCOPY WITH MYOSURE
Anesthesia: General | Site: Vagina

## 2018-04-11 MED ORDER — PROMETHAZINE HCL 25 MG/ML IJ SOLN
6.2500 mg | INTRAMUSCULAR | Status: DC | PRN
  Filled 2018-04-11: qty 1

## 2018-04-11 MED ORDER — DEXAMETHASONE SODIUM PHOSPHATE 4 MG/ML IJ SOLN
INTRAMUSCULAR | Status: DC | PRN
  Administered 2018-04-11: 10 mg via INTRAVENOUS

## 2018-04-11 MED ORDER — FENTANYL CITRATE (PF) 100 MCG/2ML IJ SOLN
INTRAMUSCULAR | Status: AC
  Filled 2018-04-11: qty 2

## 2018-04-11 MED ORDER — MIDAZOLAM HCL 2 MG/2ML IJ SOLN
INTRAMUSCULAR | Status: AC
  Filled 2018-04-11: qty 2

## 2018-04-11 MED ORDER — SODIUM CHLORIDE 0.9 % IV SOLN
2.0000 g | INTRAVENOUS | Status: AC
  Administered 2018-04-11: 2 g via INTRAVENOUS
  Filled 2018-04-11: qty 2

## 2018-04-11 MED ORDER — PROPOFOL 10 MG/ML IV BOLUS
INTRAVENOUS | Status: AC
  Filled 2018-04-11: qty 20

## 2018-04-11 MED ORDER — HYDROMORPHONE HCL 1 MG/ML IJ SOLN
0.2500 mg | INTRAMUSCULAR | Status: DC | PRN
  Filled 2018-04-11: qty 0.5

## 2018-04-11 MED ORDER — OXYCODONE-ACETAMINOPHEN 5-325 MG PO TABS
ORAL_TABLET | ORAL | Status: AC
  Filled 2018-04-11: qty 1

## 2018-04-11 MED ORDER — SODIUM CHLORIDE 0.9 % IV SOLN
INTRAVENOUS | Status: AC
  Filled 2018-04-11: qty 2

## 2018-04-11 MED ORDER — DEXAMETHASONE SODIUM PHOSPHATE 10 MG/ML IJ SOLN
INTRAMUSCULAR | Status: AC
  Filled 2018-04-11: qty 1

## 2018-04-11 MED ORDER — ENOXAPARIN SODIUM 40 MG/0.4ML ~~LOC~~ SOLN
40.0000 mg | SUBCUTANEOUS | Status: AC
  Administered 2018-04-11: 40 mg via SUBCUTANEOUS
  Filled 2018-04-11: qty 0.4

## 2018-04-11 MED ORDER — LIDOCAINE HCL 1 % IJ SOLN
INTRAMUSCULAR | Status: DC | PRN
  Administered 2018-04-11: 10 mL

## 2018-04-11 MED ORDER — WHITE PETROLATUM EX OINT
TOPICAL_OINTMENT | CUTANEOUS | Status: AC
  Filled 2018-04-11: qty 5

## 2018-04-11 MED ORDER — OXYCODONE HCL 5 MG PO TABS
5.0000 mg | ORAL_TABLET | Freq: Once | ORAL | Status: DC | PRN
  Filled 2018-04-11: qty 1

## 2018-04-11 MED ORDER — KETOROLAC TROMETHAMINE 30 MG/ML IJ SOLN
INTRAMUSCULAR | Status: AC
  Filled 2018-04-11: qty 1

## 2018-04-11 MED ORDER — OXYCODONE-ACETAMINOPHEN 5-325 MG PO TABS
1.0000 | ORAL_TABLET | ORAL | Status: DC | PRN
  Administered 2018-04-11: 1 via ORAL
  Filled 2018-04-11: qty 1

## 2018-04-11 MED ORDER — PROPOFOL 10 MG/ML IV BOLUS
INTRAVENOUS | Status: DC | PRN
  Administered 2018-04-11: 200 mg via INTRAVENOUS

## 2018-04-11 MED ORDER — LIDOCAINE 2% (20 MG/ML) 5 ML SYRINGE
INTRAMUSCULAR | Status: AC
  Filled 2018-04-11: qty 5

## 2018-04-11 MED ORDER — ENOXAPARIN SODIUM 40 MG/0.4ML ~~LOC~~ SOLN
SUBCUTANEOUS | Status: AC
  Filled 2018-04-11: qty 0.4

## 2018-04-11 MED ORDER — LACTATED RINGERS IV SOLN
INTRAVENOUS | Status: DC
  Administered 2018-04-11: 09:00:00 via INTRAVENOUS
  Filled 2018-04-11: qty 1000

## 2018-04-11 MED ORDER — ONDANSETRON HCL 4 MG/2ML IJ SOLN
INTRAMUSCULAR | Status: DC | PRN
  Administered 2018-04-11: 4 mg via INTRAVENOUS

## 2018-04-11 MED ORDER — LIDOCAINE HCL (CARDIAC) PF 100 MG/5ML IV SOSY
PREFILLED_SYRINGE | INTRAVENOUS | Status: DC | PRN
  Administered 2018-04-11: 100 mg via INTRAVENOUS

## 2018-04-11 MED ORDER — OXYCODONE HCL 5 MG/5ML PO SOLN
5.0000 mg | Freq: Once | ORAL | Status: DC | PRN
  Filled 2018-04-11: qty 5

## 2018-04-11 MED ORDER — OXYCODONE-ACETAMINOPHEN 5-325 MG PO TABS: 1.0000 | ORAL_TABLET | ORAL | 0 refills | Status: DC | PRN

## 2018-04-11 MED ORDER — KETOROLAC TROMETHAMINE 30 MG/ML IJ SOLN
INTRAMUSCULAR | Status: DC | PRN
  Administered 2018-04-11: 30 mg via INTRAVENOUS

## 2018-04-11 MED ORDER — FENTANYL CITRATE (PF) 100 MCG/2ML IJ SOLN
INTRAMUSCULAR | Status: DC | PRN
  Administered 2018-04-11 (×4): 25 ug via INTRAVENOUS
  Administered 2018-04-11: 50 ug via INTRAVENOUS
  Administered 2018-04-11 (×2): 25 ug via INTRAVENOUS

## 2018-04-11 MED ORDER — MIDAZOLAM HCL 5 MG/5ML IJ SOLN
INTRAMUSCULAR | Status: DC | PRN
  Administered 2018-04-11: 2 mg via INTRAVENOUS

## 2018-04-11 SURGICAL SUPPLY — 24 items
CANISTER SUCT 3000ML PPV (MISCELLANEOUS) ×3 IMPLANT
CATH ROBINSON RED A/P 16FR (CATHETERS) ×3 IMPLANT
COVER WAND RF STERILE (DRAPES) ×3 IMPLANT
DEVICE MYOSURE LITE (MISCELLANEOUS) IMPLANT
DEVICE MYOSURE REACH (MISCELLANEOUS) IMPLANT
DILATOR CANAL MILEX (MISCELLANEOUS) IMPLANT
GAUZE 4X4 16PLY RFD (DISPOSABLE) ×3 IMPLANT
GLOVE BIO SURGEON STRL SZ 6.5 (GLOVE) ×1 IMPLANT
GLOVE BIO SURGEON STRL SZ7.5 (GLOVE) ×6 IMPLANT
GLOVE BIOGEL PI IND STRL 6.5 (GLOVE) IMPLANT
GLOVE BIOGEL PI IND STRL 7.5 (GLOVE) IMPLANT
GLOVE BIOGEL PI INDICATOR 6.5 (GLOVE) ×1
GLOVE BIOGEL PI INDICATOR 7.5 (GLOVE) ×1
GOWN STRL REUS W/TWL XL LVL3 (GOWN DISPOSABLE) ×3 IMPLANT
IV NS IRRIG 3000ML ARTHROMATIC (IV SOLUTION) ×3 IMPLANT
KIT PROCEDURE FLUENT (KITS) ×3 IMPLANT
KIT TURNOVER CYSTO (KITS) ×3 IMPLANT
MYOSURE XL FIBROID (MISCELLANEOUS) ×2
PACK VAGINAL MINOR WOMEN LF (CUSTOM PROCEDURE TRAY) ×3 IMPLANT
PAD OB MATERNITY 4.3X12.25 (PERSONAL CARE ITEMS) ×3 IMPLANT
SEAL ROD LENS SCOPE MYOSURE (ABLATOR) ×3 IMPLANT
SYSTEM TISS REMOVAL MYOSURE XL (MISCELLANEOUS) IMPLANT
TOWEL OR 17X24 6PK STRL BLUE (TOWEL DISPOSABLE) ×6 IMPLANT
WATER STERILE IRR 500ML POUR (IV SOLUTION) IMPLANT

## 2018-04-11 NOTE — Transfer of Care (Signed)
Immediate Anesthesia Transfer of Care Note  Patient: Holly Mcdonald  Procedure(s) Performed: Procedure(s) (LRB): DILATATION & CURETTAGE/HYSTEROSCOPY WITH MYOSURE (N/A)  Patient Location: PACU  Anesthesia Type: General  Level of Consciousness: awake, sedated, patient cooperative and responds to stimulation  Airway & Oxygen Therapy: Patient Spontanous Breathing and Patient connected to [place] oxygen  Post-op Assessment: Report given to PACU RN, Post -op Vital signs reviewed and stable and Patient moving all extremities  Post vital signs: Reviewed and stable  Complications: No apparent anesthesia complications

## 2018-04-11 NOTE — H&P (Signed)
The patient was examined.  I reviewed the proposed surgery and consent form with the patient.  The dictated history and physical is current and accurate and all questions were answered. The patient is ready to proceed with surgery and has a realistic understanding and expectation for the outcome.   Anastasio Auerbach MD, 10:01 AM 04/11/2018

## 2018-04-11 NOTE — Anesthesia Postprocedure Evaluation (Signed)
Anesthesia Post Note  Patient: Holly Mcdonald  Procedure(s) Performed: DILATATION & CURETTAGE/HYSTEROSCOPY WITH MYOSURE (N/A Vagina )     Patient location during evaluation: PACU Anesthesia Type: General Level of consciousness: awake and alert Pain management: pain level controlled Vital Signs Assessment: post-procedure vital signs reviewed and stable Respiratory status: spontaneous breathing, nonlabored ventilation, respiratory function stable and patient connected to nasal cannula oxygen Cardiovascular status: blood pressure returned to baseline and stable Postop Assessment: no apparent nausea or vomiting Anesthetic complications: no    Last Vitals:  Vitals:   04/11/18 1130 04/11/18 1300  BP: 111/90 134/82  Pulse: 88 93  Resp: 18 18  Temp:  37.2 C  SpO2: 100% 99%    Last Pain:  Vitals:   04/11/18 1320  TempSrc:   PainSc: 4                  Zykiria Bruening P Ramil [place]

## 2018-04-11 NOTE — Telephone Encounter (Signed)
Is this an error, it appears a Rx was sent on 04/04/18?

## 2018-04-11 NOTE — Anesthesia Preprocedure Evaluation (Addendum)
Anesthesia Evaluation  Patient identified by MRN, date of birth, ID band Patient awake    Reviewed: Allergy & Precautions, NPO status , Patient's Chart, lab work & pertinent test results  Airway Mallampati: II  TM Distance: >3 FB Neck ROM: Full    Dental no notable dental hx.    Pulmonary asthma , PE Sees pulmonologist Elsworth Soho)   Pulmonary exam normal breath sounds clear to auscultation       Cardiovascular negative cardio ROS Normal cardiovascular exam Rhythm:Regular Rate:Normal  ECHO: LV EF: 60% -   65%   Neuro/Psych  Headaches, PSYCHIATRIC DISORDERS Anxiety    GI/Hepatic Neg liver ROS, GERD  Medicated and Controlled,  Endo/Other  Morbid obesity  Renal/GU negative Renal ROS     Musculoskeletal  (+) Fibromyalgia -  Abdominal (+) + obese,   Peds  Hematology  (+) anemia ,   Anesthesia Other Findings   Reproductive/Obstetrics                           Anesthesia Physical Anesthesia Plan  ASA: III  Anesthesia Plan: General   Post-op Pain Management:    Induction: Intravenous  PONV Risk Score and Plan: 4 or greater and Midazolam, Dexamethasone, Ondansetron and Treatment may vary due to age or medical condition  Airway Management Planned: LMA  Additional Equipment:   Intra-op Plan:   Post-operative Plan: Extubation in OR  Informed Consent: I have reviewed the patients History and Physical, chart, labs and discussed the procedure including the risks, benefits and alternatives for the proposed anesthesia with the patient or authorized representative who has indicated his/her understanding and acceptance.   Dental advisory given  Plan Discussed with: CRNA  Anesthesia Plan Comments:        Anesthesia Quick Evaluation

## 2018-04-11 NOTE — Anesthesia Procedure Notes (Signed)
Procedure Name: LMA Insertion Date/Time: 04/11/2018 10:11 AM Performed by: Justice Rocher, CRNA Pre-anesthesia Checklist: Patient identified, Emergency Drugs available, Suction available and Patient being monitored Patient Re-evaluated:Patient Re-evaluated prior to induction Oxygen Delivery Method: Circle system utilized Preoxygenation: Pre-oxygenation with 100% oxygen Induction Type: IV induction Ventilation: Mask ventilation without difficulty LMA: LMA inserted LMA Size: 5.0 Number of attempts: 1 Airway Equipment and Method: Bite block Placement Confirmation: positive ETCO2 and breath sounds checked- equal and bilateral Tube secured with: Tape Dental Injury: Teeth and Oropharynx as per pre-operative assessment  Comments: Shoulders ramped with support and pillows

## 2018-04-11 NOTE — Discharge Instructions (Signed)

## 2018-04-11 NOTE — Op Note (Signed)
Holly Mcdonald [birth year] [id number]   Post Operative Note   Date of surgery:  04/11/2018  Pre Op Dx: Leiomyoma, submucous myoma, irregular bleeding, menorrhagia  Post Op Dx: Leiomyoma, submucous myoma, irregular bleeding, menorrhagia  Procedure: Hysteroscopy, resection of submucous myoma.  Surgeon:  Anastasio Auerbach  Anesthesia:  General  EBL: 20 cc  Distended media discrepancy: 263 cc saline  Complications:  None  Specimen: Submucous myoma to pathology  Findings: EUA: External BUS vagina normal.  Cervix normal.  Uterus difficult to palpate but mildly enlarged.  No gross adnexal masses   Hysteroscopy: Adequate noting fundus, anterior/posterior endometrial surfaces, right and left tubal ostia, lower uterine segment and endocervical canal all visualized.  Large pedunculated submucous myoma originating from mid fundal myometrium.  Smaller fundal posterior endometrial surface submucous myoma with approximately 10 to 20% protruding into the cavity.  Both myomas resected in their entirety  Procedure: The patient was taken to the operating room, placed in the low dorsal lithotomy position, underwent general anesthesia and received a vaginal and perineal preparation with Betadine solution.  The bladder was emptied with an in and out Foley catheterization and the EUA was performed.  The timeout was performed by the surgical team.  The patient was draped in the usual fashion and the cervix was visualized with a speculum, anterior lip grasped with a single-tooth tenaculum and a paracervical block using 1% lidocaine was placed, 10 cc total.  The cervix was gently dilated to admit the MyoSure XL hysteroscope and hysteroscopy was performed with findings noted above.  Using the XL resectoscopic wand the pedunculated myoma was resected at approximately 50% size allowing visualization of the pedunculated stalk which was transected using the resectoscopic wand.  The free-floating myoma was  subsequently grasped with a ring forcep and removed through the cervix which was subsequently dilated further to allow for extraction of the myoma.  Repeat hysteroscopy showed complete removal of this myoma and no active bleeding from the insertion site.  A smaller submucous myoma was noted posteriorly in the upper fundal region initially obscured by the pedunculated myoma.  This was resected in several passes completely with no residual myoma.  Repeat hysteroscopy showed an empty cavity with good distention and no evidence of perforation.  The instruments were removed and the tenaculum removed from the cervix.  Adequate hemostasis was visualized at the tenaculum site and the external cervical os.  The sponge, needle and instrument counts were verified correct.  The specimen labeled for pathology.  The patient awakened without difficulty and taken to the recovery room in good condition having tolerated procedure well.   Anastasio Auerbach MD, 11:34 AM 04/11/2018

## 2018-04-11 NOTE — Telephone Encounter (Signed)
Patient needs a total of 7 days postop.  Refill whenever she needs to complete a 7-day course

## 2018-04-11 NOTE — Telephone Encounter (Signed)
I called [facility] and spoke with pharmacist and was informed that the Rx that was sent on 03/30/18 was a 7 day dose, this Rx will be denied.

## 2018-04-12 ENCOUNTER — Encounter (HOSPITAL_BASED_OUTPATIENT_CLINIC_OR_DEPARTMENT_OTHER): Payer: Self-pay | Admitting: Gynecology

## 2018-04-12 ENCOUNTER — Encounter: Payer: Self-pay | Admitting: Gynecology

## 2018-04-12 ENCOUNTER — Inpatient Hospital Stay: Payer: 59 | Attending: Hematology

## 2018-04-12 DIAGNOSIS — K219 Gastro-esophageal reflux disease without esophagitis: Secondary | ICD-10-CM | POA: Insufficient documentation

## 2018-04-12 DIAGNOSIS — M797 Fibromyalgia: Secondary | ICD-10-CM | POA: Insufficient documentation

## 2018-04-12 DIAGNOSIS — M1380 Other specified arthritis, unspecified site: Secondary | ICD-10-CM | POA: Insufficient documentation

## 2018-04-12 DIAGNOSIS — Z90710 Acquired absence of both cervix and uterus: Secondary | ICD-10-CM | POA: Insufficient documentation

## 2018-04-12 DIAGNOSIS — Z7982 Long term (current) use of aspirin: Secondary | ICD-10-CM | POA: Insufficient documentation

## 2018-04-12 DIAGNOSIS — E669 Obesity, unspecified: Secondary | ICD-10-CM | POA: Insufficient documentation

## 2018-04-12 DIAGNOSIS — Z86711 Personal history of pulmonary embolism: Secondary | ICD-10-CM | POA: Insufficient documentation

## 2018-04-12 DIAGNOSIS — Z7901 Long term (current) use of anticoagulants: Secondary | ICD-10-CM | POA: Insufficient documentation

## 2018-04-12 DIAGNOSIS — R3915 Urgency of urination: Secondary | ICD-10-CM | POA: Insufficient documentation

## 2018-04-12 DIAGNOSIS — Z79899 Other long term (current) drug therapy: Secondary | ICD-10-CM | POA: Insufficient documentation

## 2018-04-12 DIAGNOSIS — Z0289 Encounter for other administrative examinations: Secondary | ICD-10-CM

## 2018-04-12 DIAGNOSIS — J45909 Unspecified asthma, uncomplicated: Secondary | ICD-10-CM | POA: Insufficient documentation

## 2018-04-12 DIAGNOSIS — D500 Iron deficiency anemia secondary to blood loss (chronic): Secondary | ICD-10-CM | POA: Insufficient documentation

## 2018-04-12 DIAGNOSIS — E559 Vitamin D deficiency, unspecified: Secondary | ICD-10-CM | POA: Insufficient documentation

## 2018-04-13 ENCOUNTER — Encounter: Payer: 59 | Admitting: Physical Medicine & Rehabilitation

## 2018-04-13 NOTE — Telephone Encounter (Signed)
Okay for 6 days.  Tell her the procedure went great with removal of all the intracavitary fibroids

## 2018-04-14 ENCOUNTER — Telehealth: Payer: Self-pay | Admitting: Hematology

## 2018-04-14 ENCOUNTER — Inpatient Hospital Stay: Payer: 59 | Admitting: Hematology

## 2018-04-14 NOTE — Telephone Encounter (Signed)
Called to reschedule 

## 2018-04-18 ENCOUNTER — Ambulatory Visit (INDEPENDENT_AMBULATORY_CARE_PROVIDER_SITE_OTHER): Payer: 59 | Admitting: Gynecology

## 2018-04-18 ENCOUNTER — Encounter: Payer: Self-pay | Admitting: Gynecology

## 2018-04-18 VITALS — BP 124/78

## 2018-04-18 DIAGNOSIS — Z30430 Encounter for insertion of intrauterine contraceptive device: Secondary | ICD-10-CM | POA: Diagnosis not present

## 2018-04-18 HISTORY — PX: INTRAUTERINE DEVICE INSERTION: SHX323

## 2018-04-18 NOTE — Patient Instructions (Signed)

## 2018-04-18 NOTE — Progress Notes (Signed)
    Holly Mcdonald [birth year] [id number]        42 y.o.  G0P0000  presents for Mirena IUD placement. She has read through the booklet, has no contraindications and signed the consent form.  I reviewed the insertional process with her as well as the risks to include infection, either immediate or long-term, uterine perforation or migration requiring surgery to remove, other complications such as pain, hormonal side effects and possibility of failure with subsequent pregnancy.   Exam with Earnest Bailey assistant Vitals:   04/18/18 1108  BP: 124/78    Pelvic: External BUS vagina normal. Cervix normal normal. Uterus grossly normal, midline, mobile, nontender. Adnexa without masses or tenderness.  Procedure: The cervix was cleansed with Betadine, anterior lip grasped with a single-tooth tenaculum, the uterus was sounded and a Mirena IUD was placed according to manufacturer's recommendations without difficulty. The strings were trimmed. The patient tolerated well and will follow up in one month for a postinsertional check.  Lot number:  [id number]    Anastasio Auerbach MD, 11:31 AM 04/18/2018

## 2018-04-20 ENCOUNTER — Encounter: Payer: 59 | Admitting: Psychology

## 2018-04-20 ENCOUNTER — Telehealth: Payer: Self-pay

## 2018-04-20 ENCOUNTER — Encounter: Payer: Self-pay | Admitting: Gynecology

## 2018-04-20 NOTE — Telephone Encounter (Signed)
Copied from [place] [id number]. Topic: General - Other >> Apr 20, 2018  8:54 AM Alanda Slim E wrote: Reason for CRM: Pt wanted to bring by a form to be filled out for her "[place]" for her [employer] screening that she has had in October. Pt wants to know if there is a cost for completing the form/ please advise so Pt can bring it in and have filled out before years end.

## 2018-04-20 NOTE — Telephone Encounter (Signed)
Please advise. Does Dr. [place] charge to complete these forms?

## 2018-04-20 NOTE — Telephone Encounter (Signed)
Copied from [place] [id number]. Topic: Referral - Request for Referral >> Apr 20, 2018  8:41 AM Alanda Slim E wrote: Has patient seen PCP for this complaint? Yes  Referral for which specialty: Allergist  Preferred provider/office:  Kennith Gain, MD / [facility] [place] Reason for referral: To be seen at Pt appt that is scheduled for 12.13.2019 @ 10:20am

## 2018-04-21 ENCOUNTER — Encounter: Payer: Self-pay | Admitting: Allergy

## 2018-04-21 ENCOUNTER — Telehealth: Payer: Self-pay | Admitting: Family Medicine

## 2018-04-21 ENCOUNTER — Telehealth: Payer: Self-pay

## 2018-04-21 ENCOUNTER — Ambulatory Visit (INDEPENDENT_AMBULATORY_CARE_PROVIDER_SITE_OTHER): Payer: 59 | Admitting: Allergy

## 2018-04-21 ENCOUNTER — Other Ambulatory Visit: Payer: Self-pay | Admitting: *Deleted

## 2018-04-21 VITALS — BP 106/76 | HR 112 | Resp 18 | Ht 70.0 in | Wt 312.0 lb

## 2018-04-21 DIAGNOSIS — J4530 Mild persistent asthma, uncomplicated: Secondary | ICD-10-CM

## 2018-04-21 DIAGNOSIS — H1013 Acute atopic conjunctivitis, bilateral: Secondary | ICD-10-CM

## 2018-04-21 DIAGNOSIS — J3089 Other allergic rhinitis: Secondary | ICD-10-CM | POA: Diagnosis not present

## 2018-04-21 DIAGNOSIS — J309 Allergic rhinitis, unspecified: Secondary | ICD-10-CM

## 2018-04-21 MED ORDER — FLUTICASONE-SALMETEROL 250-50 MCG/DOSE IN AEPB: 1.0000 | INHALATION_SPRAY | Freq: Two times a day (BID) | RESPIRATORY_TRACT | 5 refills | Status: DC | PRN

## 2018-04-21 MED ORDER — ALBUTEROL SULFATE HFA 108 (90 BASE) MCG/ACT IN AERS: 1.0000 | INHALATION_SPRAY | Freq: Four times a day (QID) | RESPIRATORY_TRACT | 1 refills | Status: DC | PRN

## 2018-04-21 MED ORDER — CETIRIZINE HCL 10 MG PO TABS: 10.0000 mg | ORAL_TABLET | Freq: Every morning | ORAL | 5 refills | Status: DC

## 2018-04-21 MED ORDER — FLUTICASONE PROPIONATE 50 MCG/ACT NA SUSP: NASAL | 2 refills | Status: DC

## 2018-04-21 MED ORDER — MONTELUKAST SODIUM 10 MG PO TABS: 10.0000 mg | ORAL_TABLET | Freq: Every day | ORAL | 6 refills | Status: DC

## 2018-04-21 NOTE — Progress Notes (Signed)
Follow-up Note  RE: Holly Mcdonald MRN: [id number] DOB: 1975-09-25 Date of Office Visit: 04/21/2018   History of present illness: Holly Mcdonald is a 42 y.o. female presenting today for follow-up of allergic rhinitis with conjunctivitis that has been associated with migraines as well as adult onset asthma.  She was last seen in the office on June 13, 2017 by myself.  Since this visit she states she has had new diagnoses of the knees that have helped to understand her previous history of having pulmonary embolism.  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  She states that further testing is still ongoing for a definitive autoimmune diagnosis however she has been started on Plaquenil since October.  She states since she has been on Plaquenil she has been able to be more mobile and move her joints.  She states prior to starting on Plaquenil she had become unable to move her left arm at the shoulder joint. She also got diagnosed with a uterine fibroids and has had fibroidectomy a week ago and states she will need to have another sometime next year.  She states with the surgery it flared up her fibromyalgia. She also has been dealing with her grandmother who has been sick who has required hospitalization recently and she did have to stay with her grandmother in the hospital. Otherwise she states that she has been doing overall much better and is happy to have a autoimmune diagnosis that helps to explain her history of pulmonary embolism. In regards to her allergic rhinitis with conjunctivitis she states that she is well controlled with use of Singulair and cetirizine daily and as needed use of fluticasone. With her asthma she has been doing well as well and states she is only had one episode of bronchitis during in July.  She states that she only uses her Advair when  she does develop symptoms of bronchitis that she is only required ever use once this year.  She states by now in previous years she would have had 4-5 episodes of bronchitis already. She does follow with pulmonary for history of the pulmonary embolism.  She also follows with hematology and is on anticoagulation. For her history of migraine she does take Topamax which does help to manage her migraine symptoms. She also has open angle glaucoma and follows with an ophthalmologist.  Review of systems: Review of Systems  Constitutional: Positive for malaise/fatigue. Negative for chills and fever.  HENT: Negative for congestion, ear discharge, ear pain, nosebleeds, sinus pain and sore throat.   Eyes: Negative for discharge and redness.  Respiratory: Negative for cough, sputum production, shortness of breath and wheezing.   Cardiovascular: Negative for chest pain.  Gastrointestinal: Negative for abdominal pain, constipation, diarrhea, nausea and vomiting.  Musculoskeletal: Positive for joint pain.  Skin: Negative for itching and rash.  Neurological: Negative for headaches.    All other systems negative unless noted above in HPI  Past medical/social/surgical/family history have been reviewed and are unchanged unless specifically indicated below.  No changes  Medication List: Allergies as of 04/21/2018      Reactions   Other Nausea And Vomiting   Darvocet N-100   Darvon [propoxyphene]       Medication List       Accurate as of April 21, 2018 10:33 AM. Always use your most recent med list.  ADVAIR DISKUS 250-50 MCG/DOSE Aepb Generic drug:  Fluticasone-Salmeterol INHALE 1 PUFF INTO THE LUNGS TWICE DAILY   albuterol 108 (90 Base) MCG/ACT inhaler Commonly known as:  PROVENTIL HFA;VENTOLIN HFA Inhale 1-2 puffs into the lungs every 6 (six) hours as needed for wheezing or shortness of breath.   albuterol (2.5 MG/3ML) 0.083% nebulizer solution Commonly known as:   PROVENTIL Take 3 mLs (2.5 mg total) by nebulization every 6 (six) hours as needed for wheezing or shortness of breath.   aspirin EC 81 MG tablet Take 81 mg by mouth daily.   baclofen 10 MG tablet Commonly known as:  LIORESAL TAKE 1 TABLET BY MOUTH THREE TIMES DAILY AS NEEDED FOR MUSCLE SPASMS   benzonatate 200 MG capsule Commonly known as:  TESSALON Take 1 capsule (200 mg total) by mouth 3 (three) times daily as needed for cough.   cetirizine 10 MG tablet Commonly known as:  ZYRTEC Take 10 mg by mouth every morning.   DULoxetine 30 MG capsule Commonly known as:  CYMBALTA Take 1 capsule (30 mg total) by mouth daily.   ELIQUIS 5 MG Tabs tablet Generic drug:  apixaban Take 5 mg by mouth 2 (two) times daily.   fluticasone 50 MCG/ACT nasal spray Commonly known as:  FLONASE SHAKE LIQUID AND USE 1 SPRAY IN EACH NOSTRIL DAILY   hydroxychloroquine 200 MG tablet Commonly known as:  PLAQUENIL Take 200 mg by mouth daily.   Iron 325 (65 Fe) MG Tabs Take 3 tablets by mouth every morning.   methocarbamol 500 MG tablet Commonly known as:  ROBAXIN Take 1 tablet (500 mg total) by mouth 2 (two) times daily as needed for muscle spasms.   montelukast 10 MG tablet Commonly known as:  SINGULAIR Take 1 tablet (10 mg total) by mouth at bedtime.   omeprazole 40 MG capsule Commonly known as:  PRILOSEC TAKE 1 CAPSULE(40 MG) BY MOUTH DAILY   oxybutynin 5 MG tablet Commonly known as:  DITROPAN Take 1 tablet by mouth 2 (two) times daily.   PREDNISONE PO Take 5 mg by mouth.   pregabalin 75 MG capsule Commonly known as:  LYRICA Take 1 capsule (75 mg total) by mouth 3 (three) times daily.   timolol 0.5 % ophthalmic solution Commonly known as:  TIMOPTIC Place 1 drop into both eyes every morning.   topiramate 25 MG tablet Commonly known as:  TOPAMAX TK 3 TS PO D----- per pt takes 3 tablets qhs   VITAMIN B 12 PO Take 1,000 mg by mouth daily.   Vitamin D (Ergocalciferol) 1.25 MG  (50000 UT) Caps capsule Commonly known as:  DRISDOL TAKE 1 CAPSULE BY MOUTH every 10 days.       Known medication allergies: Allergies  Allergen Reactions  . Other Nausea And Vomiting    Darvocet N-100  . Darvon [Propoxyphene]      Physical examination: Blood pressure 106/76, pulse (!) 112, resp. rate 18, height 5\' 10"  (1.778 m), weight (!) 312 lb (141.5 kg), last menstrual period 03/27/2018, SpO2 97 %.  General: Alert, interactive, in no acute distress. HEENT: PERRLA, TMs pearly gray, turbinates minimally edematous without discharge with scabbing of the anterior septum b/l, post-pharynx non erythematous. Neck: Supple without lymphadenopathy. Lungs: Clear to auscultation without wheezing, rhonchi or rales. {no increased work of breathing. CV: Normal S1, S2 without murmurs. Abdomen: Nondistended, nontender. Skin: Warm and dry, without lesions or rashes. Extremities:  No clubbing, cyanosis or edema. Neuro:   Grossly intact.  Diagnositics/Labs:  Spirometry: FEV1:  2.87L 93%, FVC: 3.38L 89%, ratio consistent with nonobstructive pattern  Assessment and plan:   Allergic rhinitis with conjunctivitis     - continue avoidance measures for tree pollen, molds, dust mites and cockroach.     - continue use of Cetirizine 10mg  daily    - continue Singulair 10mg  daily at night    - continue Fluticasone 1-2 sprays each nostril daily for nasal congestion    - allergen immunotherapy (allergy shots) discussed have been previously discussed if medication management becomes ineffective.    Asthma, adult-onset    - continue Advair diskus 1 puff twice a day during asthma flares/bronchitis episodes.  Use until symptoms have resolved.  If you start to have symptoms inbetween bronchitis episodes recommend daily use of Advair    - have access to albuterol inhaler 2 puffs every 4-6 hours as needed for cough/wheeze/shortness of breath/chest tightness.  May use 15-20 minutes prior to activity.   Monitor  frequency of use.    Asthma control goals:   Full participation in all desired activities (may need albuterol before activity)  Albuterol use two time or less a week on average (not counting use with activity)  Cough interfering with sleep two time or less a month  Oral steroids no more than once a year  No hospitalizations  History of PE   - continue anticoagulation as per hematology  Follow-up 6 months or sooner if needed   I appreciate the opportunity to take part in Holly's care. Please do not hesitate to contact me with questions.  Sincerely,   Prudy Feeler, MD Allergy/Immunology [facility]

## 2018-04-21 NOTE — Telephone Encounter (Signed)
Patient called and asked me to revise and add her 04/04/18 pre op visit to her FMLA forms to cover that visit with her job.  I made the revision and refaxed forms. Called patient back and left message letting her know that I did.

## 2018-04-21 NOTE — Telephone Encounter (Signed)
The patient dropped off a physician results form  Fax form to: [phone]  Disposition: Dr's folder

## 2018-04-21 NOTE — Telephone Encounter (Signed)
Patient informed there wouldn't be any charge for the type of form that she is bringing to the office and verbalized understanding. Patient stated that she needed referral for Allergist that she saw on today due to her insurance. Referral placed as requested.

## 2018-04-21 NOTE — Telephone Encounter (Signed)
Form placed on doctor's desk for completion. 

## 2018-04-21 NOTE — Patient Instructions (Addendum)
Allergic rhinitis with conjunctivitis and history of Migraines    - continue avoidance measures for tree pollen, molds, dust mites and cockroach.     - continue use of Cetirizine 10mg  daily    - continue Singulair 10mg  daily at night    - continue Fluticasone 1-2 sprays each nostril daily for nasal congestion    - allergen immunotherapy (allergy shots) discussed have been previously discussed if medication management becomes ineffective.    Asthma, adult-onset    - continue Advair diskus 1 puff twice a day during asthma flares/bronchitis episodes.  Use until symptoms have resolved.  If you start to have symptoms inbetween bronchitis episodes recommend daily use of Advair    - have access to albuterol inhaler 2 puffs every 4-6 hours as needed for cough/wheeze/shortness of breath/chest tightness.  May use 15-20 minutes prior to activity.   Monitor frequency of use.    Asthma control goals:   Full participation in all desired activities (may need albuterol before activity)  Albuterol use two time or less a week on average (not counting use with activity)  Cough interfering with sleep two time or less a month  Oral steroids no more than once a year  No hospitalizations  History of PE   - continue anticoagulation as per hematology  Follow-up 6 months or sooner if needed

## 2018-04-21 NOTE — Telephone Encounter (Signed)
Copied from [place] [id number]. Topic: Referral - Request for Referral >> Apr 20, 2018  8:41 AM Alanda Slim E wrote: Has patient seen PCP for this complaint? Yes  Referral for which specialty: Allergist  Preferred provider/office:  Kennith Gain, MD / [facility] [place] Reason for referral: To be seen at Pt appt that is scheduled for 12.13.2019 @ 10:20am >> Apr 20, 2018  6:03 PM Yvette Rack wrote: Pt stated she needs the referral sent as soon as possible because she has an appt 04/21/18 at 10:20 am. Pt stated she has to keep the appt because she is unable to get her medication refilled without being seen.

## 2018-04-21 NOTE — Telephone Encounter (Signed)
Referral has been placed as requested.

## 2018-04-24 NOTE — Progress Notes (Signed)
[facility]   Telephone:([phone] [phone]   Clinic Follow up Note   Patient Care Team: Martinique, Betty G, MD as PCP - General (Family Medicine) 04/26/2018  CHIEF COMPLAINT: F/u on IDA and h/o of PE   CURRENT THERAPY Ferrous Sulfate 3 tablets daily, Eliquis 5 mg, baby aspirin.   INTERVAL HISTORY: Holly Mcdonald is a 42 y.o. female who is here for follow-up. Today, she is here alone. She states that hysterectomy was life-threatening and she was recently diagnosed with an autoimmune disease, inflammatory arthritis, and connective tissue disease. She started a medication for this and is happy about being able to move her arms better now. She had a hysteroscopy and IUD placement. She was found to have fibroids. IUD was placed last week and her menstrual bleeding has improved since then. No ablation was done. She is not taking iron pills. She feels tired.    Pertinent positives and negatives of review of systems are listed and detailed within the above HPI.  REVIEW OF SYSTEMS:   Constitutional: Denies fevers, chills or abnormal weight loss (+) fatigue Eyes: Denies blurriness of vision Ears, nose, mouth, throat, and face: Denies mucositis or sore throat Respiratory: Denies cough, dyspnea or wheezes Cardiovascular: Denies palpitation, chest discomfort or lower extremity swelling Gastrointestinal:  Denies nausea, heartburn or change in bowel habits GU: (+) IUD placement last week. Menses improving. Skin: Denies abnormal skin rashes Lymphatics: Denies new lymphadenopathy or easy bruising Neurological:Denies numbness, tingling or new weaknesses Behavioral/Psych: Mood is stable, no new changes  All other systems were reviewed with the patient and are negative.  MEDICAL HISTORY:  Past Medical History:  Diagnosis Date  . Anticoagulated    eliquis  . Arthritis    inflammatory  . Bladder spasm   . Connective tissue disease ([place]) dx oct 2019  .  Fibromyalgia   . GERD (gastroesophageal reflux disease)   . Heavy menstrual bleeding    resolved with megace  . History of chronic bronchitis    last flare up 11-18-17  . History of pulmonary embolism 10/2016--- followed by pcp and pulmoloigst   dx bilateral PE , bilateral lower lobes , segmental , unprovoked, negative hypercoagulable work-up-- treated w/ eliquis  . Iron deficiency anemia   . Migraine   . Mild intermittent asthma    pulmologist-  dr Elsworth Soho  . Morbid obesity with BMI of 45.0-49.9, adult ([place])   . Open-angle glaucoma of both eyes   . Pre-diabetes   . 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 Urgency of urination   . Uterine fibroid   . Vitamin D deficiency   . Wears glasses     SURGICAL HISTORY: Past Surgical History:  Procedure Laterality Date  . ANAL EXAMINATION UNDER ANESTHESIA  age 7   w/ removal cyst  . DILATATION & CURETTAGE/HYSTEROSCOPY WITH MYOSURE N/A 10/17/2017   Procedure: [place];  Surgeon: Anastasio Auerbach, MD;  Location: [facility];  Service: Gynecology;  Laterality: N/A;  . DILATATION & CURETTAGE/HYSTEROSCOPY WITH MYOSURE N/A 04/11/2018   Procedure: DILATATION & CURETTAGE/HYSTEROSCOPY WITH MYOSURE;  Surgeon: Anastasio Auerbach, MD;  Location: [facility];  Service: Gynecology;  Laterality: N/A;  . GYNECOLOGIC CRYOSURGERY  age 3s  . INTRAUTERINE DEVICE INSERTION  04/18/2018  . TONSILLECTOMY  age 39  . TRANSTHORACIC ECHOCARDIOGRAM  01/17/2017   ef 60-65%/  mild TR    I have reviewed the social history and family  history with the patient and they are unchanged from previous note.  ALLERGIES:  is allergic to other and darvon [propoxyphene].  MEDICATIONS:  Current Outpatient Medications  Medication Sig Dispense Refill  . albuterol (PROVENTIL HFA;VENTOLIN HFA) 108 (90 Base) MCG/ACT inhaler Inhale 1-2 puffs into the lungs every 6 (six) hours as needed for  wheezing or shortness of breath. 18 g 1  . albuterol (PROVENTIL) (2.5 MG/3ML) 0.083% nebulizer solution Take 3 mLs (2.5 mg total) by nebulization every 6 (six) hours as needed for wheezing or shortness of breath. 360 mL 5  . apixaban (ELIQUIS) 5 MG TABS tablet Take 1 tablet (5 mg total) by mouth 2 (two) times daily. 60 tablet 5  . aspirin EC 81 MG tablet Take 81 mg by mouth daily.    . baclofen (LIORESAL) 10 MG tablet TAKE 1 TABLET BY MOUTH THREE TIMES DAILY AS NEEDED FOR MUSCLE SPASMS 90 tablet 5  . benzonatate (TESSALON) 200 MG capsule Take 1 capsule (200 mg total) by mouth 3 (three) times daily as needed for cough. 30 capsule 1  . cetirizine (ZYRTEC) 10 MG tablet Take 1 tablet (10 mg total) by mouth every morning. 30 tablet 5  . Cyanocobalamin (VITAMIN B 12 PO) Take 1,000 mg by mouth daily.     . DULoxetine (CYMBALTA) 30 MG capsule Take 1 capsule (30 mg total) by mouth daily. (Patient taking differently: Take 30 mg by mouth at bedtime. ) 30 capsule 2  . Ferrous Sulfate (IRON) 325 (65 Fe) MG TABS Take 3 tablets (975 mg total) by mouth every morning. 90 tablet 5  . fluticasone (FLONASE) 50 MCG/ACT nasal spray SHAKE LIQUID AND USE 1 SPRAY IN EACH NOSTRIL DAILY--  in am 16 g 2  . Fluticasone-Salmeterol (ADVAIR DISKUS) 250-50 MCG/DOSE AEPB Inhale 1 puff into the lungs 2 (two) times daily as needed. 60 each 5  . hydroxychloroquine (PLAQUENIL) 200 MG tablet Take 200 mg by mouth daily.    . methocarbamol (ROBAXIN) 500 MG tablet Take 1 tablet (500 mg total) by mouth 2 (two) times daily as needed for muscle spasms. 60 tablet 1  . montelukast (SINGULAIR) 10 MG tablet Take 1 tablet (10 mg total) by mouth at bedtime. 30 tablet 6  . omeprazole (PRILOSEC) 40 MG capsule TAKE 1 CAPSULE(40 MG) BY MOUTH DAILY (Patient taking differently: TAKE 1 CAPSULE(40 MG) BY MOUTH DAILY--- takes in am) 30 capsule 0  . oxybutynin (DITROPAN) 5 MG tablet Take 1 tablet by mouth 2 (two) times daily.  11  . PREDNISONE PO Take 5 mg by  mouth.     . pregabalin (LYRICA) 75 MG capsule Take 1 capsule (75 mg total) by mouth 3 (three) times daily. 90 capsule 1  . timolol (TIMOPTIC) 0.5 % ophthalmic solution Place 1 drop into both eyes every morning.    . topiramate (TOPAMAX) 25 MG tablet TK 3 TS PO D----- per pt takes 3 tablets qhs  1  . Vitamin D, Ergocalciferol, (DRISDOL) 50000 units CAPS capsule TAKE 1 CAPSULE BY MOUTH every 10 days. 10 capsule 1   No current facility-administered medications for this visit.     PHYSICAL EXAMINATION: ECOG PERFORMANCE STATUS: 1 - Symptomatic but completely ambulatory  Vitals:   04/26/18 0911  BP: 139/89  Pulse: 91  Resp: 18  Temp: 99.4 F (37.4 C)  SpO2: 100%   Filed Weights   04/26/18 0911  Weight: (!) 318 lb 14.4 oz (144.7 kg)    GENERAL:alert, no distress and comfortable SKIN: skin color,  texture, turgor are normal, no rashes or significant lesions EYES: normal, Conjunctiva are pink and non-injected, sclera clear OROPHARYNX:no exudate, no erythema and lips, buccal mucosa, and tongue normal  NECK: supple, thyroid normal size, non-tender, without nodularity LYMPH:  no palpable lymphadenopathy in the cervical, axillary or inguinal LUNGS: clear to auscultation and percussion with normal breathing effort HEART: regular rate & rhythm and no murmurs and no lower extremity edema ABDOMEN:abdomen soft, non-tender and normal bowel sounds Musculoskeletal:no cyanosis of digits and no clubbing  NEURO: alert & oriented x 3 with fluent speech, no focal motor/sensory deficits  LABORATORY DATA:  I have reviewed the data as listed CBC Latest Ref Rng & Units 04/26/2018 04/05/2018 02/09/2018  WBC 4.0 - 10.5 K/uL 7.1 7.3 8.3  Hemoglobin 12.0 - 15.0 g/dL 11.2(L) 11.4(L) 12.4  Hematocrit 36.0 - 46.0 % 35.2(L) 35.9(L) 37.1  Platelets 150 - 400 K/uL 286 288 234     CMP Latest Ref Rng & Units 04/26/2018 04/05/2018 01/22/2018  Glucose 70 - 99 mg/dL 98 99 119(H)  BUN 6 - 20 mg/dL 8 11 13     Creatinine 0.44 - 1.00 mg/dL 0.83 0.75 0.79  Sodium 135 - 145 mmol/L 139 139 140  Potassium 3.5 - 5.1 mmol/L 3.8 3.7 3.6  Chloride 98 - 111 mmol/L 110 113(H) 111  CO2 22 - 32 mmol/L 21(L) 21(L) 21(L)  Calcium 8.9 - 10.3 mg/dL 9.3 9.0 8.9  Total Protein 6.5 - 8.1 g/dL 7.1 7.1 6.6  Total Bilirubin 0.3 - 1.2 mg/dL 0.5 0.6 0.2(L)  Alkaline Phos 38 - 126 U/L 57 47 50  AST 15 - 41 U/L 12(L) 16 14(L)  ALT 0 - 44 U/L 13 13 10       RADIOGRAPHIC STUDIES: I have personally reviewed the radiological images as listed and agreed with the findings in the report. No results found.   ASSESSMENT & PLAN:  Holly Mcdonald is a 42 y.o. female with history of  1. Unprovoked Pulmonary Embolism in 10/2016 -hypercoagulability work up was negative when she was diagnosed with PE. -Due to her family history of thrombosis in her mother, unprovoked PE, and her obesity, she is at high risk for recurrent thrombosis.  I recommend continue anticoagulation indefinitely if there is no contraindication. -Continue Eliquis 5 mg q12h, and baby aspirin. -She was recently diagnosed with an autoimmune disorder, for which she takes Plaquenil. I educated her that this medication can lower her blood counts slightly. -Labs reviewed, CBC showed Hg 11.2. -I refilled her Eliquis  2. Iron deficient anemia, secondary to her menorrhagia -She is not on iron pills currently. I advised her to restart. -She had an IUD Mirena placed last week and this improved her heavy menses.  She has had vaginal bleeding for the last 8 months, finally stopped after IUD placement. -She understands no estrogen-containing birth control pill or IUD, her risk of thrombosis. -Labs reviewed, CBC showed Hg 11.2. I ron studies and CMP pending.  -I will set up IV iron if needed  3. Anxiety and Fibromyalgia  -f/u with Dr. Martinique  4. Obesity -I encourage her to exercise to lose weight and prevent PE recurrence   Plan  -labs in 3 months -f/u in  6 months with labs  -I refilled Eliquis and oral iron -if her iron level is low, will set up iv feraheme for her   No problem-specific Assessment & Plan notes found for this encounter.   No orders of the defined types were placed in this encounter.  All questions were answered. The patient knows to call the clinic with any problems, questions or concerns. No barriers to learning was detected. I spent 20 minutes counseling the patient face to face. The total time spent in the appointment was 25 minutes and more than 50% was on counseling and review of test results  I, Noor Dweik am acting as scribe for Dr. Truitt Merle.  I have reviewed the above documentation for accuracy and completeness, and I agree with the above.     Truitt Merle, MD 04/26/2018

## 2018-04-26 ENCOUNTER — Inpatient Hospital Stay (HOSPITAL_BASED_OUTPATIENT_CLINIC_OR_DEPARTMENT_OTHER): Payer: 59 | Admitting: Hematology

## 2018-04-26 ENCOUNTER — Encounter: Payer: Self-pay | Admitting: Hematology

## 2018-04-26 ENCOUNTER — Inpatient Hospital Stay: Payer: 59

## 2018-04-26 ENCOUNTER — Encounter: Payer: Self-pay | Admitting: Gynecology

## 2018-04-26 ENCOUNTER — Telehealth: Payer: Self-pay

## 2018-04-26 VITALS — BP 139/89 | HR 91 | Temp 99.4°F | Resp 18 | Ht 70.0 in | Wt 318.9 lb

## 2018-04-26 DIAGNOSIS — Z86711 Personal history of pulmonary embolism: Secondary | ICD-10-CM | POA: Diagnosis not present

## 2018-04-26 DIAGNOSIS — Z90710 Acquired absence of both cervix and uterus: Secondary | ICD-10-CM

## 2018-04-26 DIAGNOSIS — D500 Iron deficiency anemia secondary to blood loss (chronic): Secondary | ICD-10-CM | POA: Diagnosis not present

## 2018-04-26 DIAGNOSIS — M1380 Other specified arthritis, unspecified site: Secondary | ICD-10-CM | POA: Diagnosis not present

## 2018-04-26 DIAGNOSIS — E669 Obesity, unspecified: Secondary | ICD-10-CM | POA: Diagnosis not present

## 2018-04-26 DIAGNOSIS — M797 Fibromyalgia: Secondary | ICD-10-CM

## 2018-04-26 DIAGNOSIS — Z79899 Other long term (current) drug therapy: Secondary | ICD-10-CM

## 2018-04-26 DIAGNOSIS — R3915 Urgency of urination: Secondary | ICD-10-CM | POA: Diagnosis not present

## 2018-04-26 DIAGNOSIS — Z86718 Personal history of other venous thrombosis and embolism: Secondary | ICD-10-CM

## 2018-04-26 DIAGNOSIS — K219 Gastro-esophageal reflux disease without esophagitis: Secondary | ICD-10-CM

## 2018-04-26 DIAGNOSIS — J45909 Unspecified asthma, uncomplicated: Secondary | ICD-10-CM | POA: Diagnosis not present

## 2018-04-26 DIAGNOSIS — Z7901 Long term (current) use of anticoagulants: Secondary | ICD-10-CM

## 2018-04-26 DIAGNOSIS — E559 Vitamin D deficiency, unspecified: Secondary | ICD-10-CM | POA: Diagnosis not present

## 2018-04-26 DIAGNOSIS — I2699 Other pulmonary embolism without acute cor pulmonale: Secondary | ICD-10-CM

## 2018-04-26 DIAGNOSIS — Z7982 Long term (current) use of aspirin: Secondary | ICD-10-CM | POA: Diagnosis not present

## 2018-04-26 LAB — CBC WITH DIFFERENTIAL/PLATELET
ABS IMMATURE GRANULOCYTES: 0.02 10*3/uL (ref 0.00–0.07)
Basophils Absolute: 0 10*3/uL (ref 0.0–0.1)
Basophils Relative: 1 %
Eosinophils Absolute: 0 10*3/uL (ref 0.0–0.5)
Eosinophils Relative: 1 %
HCT: 35.2 % — ABNORMAL LOW (ref 36.0–46.0)
Hemoglobin: 11.2 g/dL — ABNORMAL LOW (ref 12.0–15.0)
Immature Granulocytes: 0 %
Lymphocytes Relative: 32 %
Lymphs Abs: 2.3 10*3/uL (ref 0.7–4.0)
MCH: 27.7 pg (ref 26.0–34.0)
MCHC: 31.8 g/dL (ref 30.0–36.0)
MCV: 87.1 fL (ref 80.0–100.0)
MONO ABS: 0.6 10*3/uL (ref 0.1–1.0)
MONOS PCT: 8 %
Neutro Abs: 4.2 10*3/uL (ref 1.7–7.7)
Neutrophils Relative %: 58 %
Platelets: 286 10*3/uL (ref 150–400)
RBC: 4.04 MIL/uL (ref 3.87–5.11)
RDW: 12.9 % (ref 11.5–15.5)
WBC: 7.1 10*3/uL (ref 4.0–10.5)
nRBC: 0 % (ref 0.0–0.2)

## 2018-04-26 LAB — COMPREHENSIVE METABOLIC PANEL
ALT: 13 U/L (ref 0–44)
AST: 12 U/L — AB (ref 15–41)
Albumin: 3.8 g/dL (ref 3.5–5.0)
Alkaline Phosphatase: 57 U/L (ref 38–126)
Anion gap: 8 (ref 5–15)
BUN: 8 mg/dL (ref 6–20)
CO2: 21 mmol/L — ABNORMAL LOW (ref 22–32)
Calcium: 9.3 mg/dL (ref 8.9–10.3)
Chloride: 110 mmol/L (ref 98–111)
Creatinine, Ser: 0.83 mg/dL (ref 0.44–1.00)
GFR calc Af Amer: >60 mL/min (ref >60)
GFR calc non Af Amer: >60 mL/min (ref >60)
GLUCOSE: 98 mg/dL (ref 70–99)
Potassium: 3.8 mmol/L (ref 3.5–5.1)
Sodium: 139 mmol/L (ref 135–145)
Total Bilirubin: 0.5 mg/dL (ref 0.3–1.2)
Total Protein: 7.1 g/dL (ref 6.5–8.1)

## 2018-04-26 LAB — IRON AND TIBC
Iron: 52 ug/dL (ref 41–142)
SATURATION RATIOS: 13 % — AB (ref 21–57)
TIBC: 395 ug/dL (ref 236–444)
UIBC: 343 ug/dL (ref 120–384)

## 2018-04-26 LAB — FERRITIN: Ferritin: 8 ng/mL — ABNORMAL LOW (ref 11–307)

## 2018-04-26 MED ORDER — IRON 325 (65 FE) MG PO TABS: 3.0000 | ORAL_TABLET | Freq: Every morning | ORAL | 5 refills | Status: DC

## 2018-04-26 MED ORDER — APIXABAN 5 MG PO TABS: 5.0000 mg | ORAL_TABLET | Freq: Two times a day (BID) | ORAL | 5 refills | Status: DC

## 2018-04-26 NOTE — Telephone Encounter (Signed)
Form faxed to [facility] as requested.

## 2018-04-26 NOTE — Telephone Encounter (Signed)
Printed avs and calender of upcoming appointment. Per 12/18 los 

## 2018-05-01 ENCOUNTER — Telehealth: Payer: Self-pay | Admitting: Hematology

## 2018-05-01 ENCOUNTER — Telehealth: Payer: Self-pay

## 2018-05-01 ENCOUNTER — Other Ambulatory Visit: Payer: Self-pay | Admitting: Gynecology

## 2018-05-01 NOTE — Telephone Encounter (Signed)
-----   Message from Truitt Merle, MD sent at 04/30/2018  1:18 PM EST ----- Please let pt know her iron level is low, and I will set up iv iron weekly X2 for her, thanks   Truitt Merle  04/30/2018

## 2018-05-01 NOTE — Telephone Encounter (Signed)
Scheduled appt per 12/22 sch message . Pt is aware of appt date and time

## 2018-05-01 NOTE — Telephone Encounter (Signed)
Left voice message for patient iron level is low, per Dr. Burr Medico will schedule IV iron infusion weekly x 2 weeks, first one is scheduled for Saturday 05/13/18 at 10:15.

## 2018-05-08 ENCOUNTER — Ambulatory Visit (INDEPENDENT_AMBULATORY_CARE_PROVIDER_SITE_OTHER): Payer: 59 | Admitting: Primary Care

## 2018-05-08 ENCOUNTER — Encounter: Payer: Self-pay | Admitting: Primary Care

## 2018-05-08 ENCOUNTER — Ambulatory Visit (INDEPENDENT_AMBULATORY_CARE_PROVIDER_SITE_OTHER)
Admission: RE | Admit: 2018-05-08 | Discharge: 2018-05-08 | Disposition: A | Discharge status: Home / Self Care | Payer: 59 | Source: Ambulatory Visit | Attending: Primary Care | Admitting: Primary Care

## 2018-05-08 VITALS — BP 124/90 | HR 98 | Temp 98.7°F | Ht 70.0 in | Wt 310.0 lb

## 2018-05-08 DIAGNOSIS — J45909 Unspecified asthma, uncomplicated: Secondary | ICD-10-CM | POA: Diagnosis not present

## 2018-05-08 DIAGNOSIS — R0602 Shortness of breath: Secondary | ICD-10-CM

## 2018-05-08 DIAGNOSIS — R531 Weakness: Secondary | ICD-10-CM

## 2018-05-08 DIAGNOSIS — I2699 Other pulmonary embolism without acute cor pulmonale: Secondary | ICD-10-CM | POA: Diagnosis not present

## 2018-05-08 LAB — NITRIC OXIDE: Nitric Oxide: 25

## 2018-05-08 LAB — POCT INFLUENZA A/B
Influenza A, POC: NEGATIVE
Influenza B, POC: NEGATIVE

## 2018-05-08 MED ORDER — PREDNISONE 10 MG PO TABS: ORAL_TABLET | ORAL | 0 refills | Status: DC

## 2018-05-08 MED ORDER — LEVALBUTEROL HCL 0.63 MG/3ML IN NEBU
0.6300 mg | INHALATION_SOLUTION | Freq: Once | RESPIRATORY_TRACT | Status: AC
  Administered 2018-05-08: 0.63 mg via RESPIRATORY_TRACT

## 2018-05-08 NOTE — Progress Notes (Signed)
@Patient  ID: Holly Mcdonald, female    DOB: 02/20/1976, 42 y.o.   MRN: [id number]  Chief Complaint  Patient presents with  . Acute Visit    SOB, weakness, used inhaler for relief, using rescue inhaler but no relief    Referring provider: Martinique, Betty G, MD  HPI: 42 year old, never smoked. 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 Patient of Dr. Elsworth Soho, last seen on 12/21/17. Maintained on Advair.   05/09/2018 Patient presents today for acute visit with complaints of shortness of breath x1 day. Associated chest tightness. Continues Advair as prescribed. Took rescue inhaler twice today with no noticeable difference. Reports low grade temp at home. Unsure if symptoms are related to her asthma or autoimmune disease. No cough or URI symptoms.     Allergies  Allergen Reactions  . Other Nausea And Vomiting    Darvocet N-100  . Darvon [Propoxyphene]     Immunization History  Administered Date(s) Administered  . Influenza,inj,Quad PF,6+ Mos 01/25/2017, 02/15/2018  . Pneumococcal Polysaccharide-23 11/04/2016    Past Medical History:  Diagnosis Date  . Anticoagulated    eliquis  . Arthritis    inflammatory  . Bladder spasm   . Connective tissue disease ([place]) dx oct 2019  . Fibromyalgia   . GERD (gastroesophageal reflux disease)   . Heavy menstrual bleeding    resolved with megace  . History of chronic bronchitis    last flare up 11-18-17  . History of pulmonary embolism 10/2016--- followed by pcp and pulmoloigst   dx bilateral PE , bilateral lower lobes , segmental , unprovoked, negative hypercoagulable work-up-- treated w/ eliquis  . Iron deficiency anemia   . Migraine   . Mild intermittent asthma    pulmologist-  dr Elsworth Soho  . Morbid obesity with BMI of 45.0-49.9, adult ([place])   . Open-angle glaucoma of both eyes   . Pre-diabetes   .  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 Urgency of urination   . Uterine fibroid   . Vitamin D deficiency   . Wears glasses     Tobacco History: Social History   Tobacco Use  Smoking Status Never Smoker  Smokeless Tobacco Never Used   Counseling given: Not Answered   Outpatient Medications Prior to Visit  Medication Sig Dispense Refill  . albuterol (PROVENTIL HFA;VENTOLIN HFA) 108 (90 Base) MCG/ACT inhaler Inhale 1-2 puffs into the lungs every 6 (six) hours as needed for wheezing or shortness of breath. 18 g 1  . albuterol (PROVENTIL) (2.5 MG/3ML) 0.083% nebulizer solution Take 3 mLs (2.5 mg total) by nebulization every 6 (six) hours as needed for wheezing or shortness of breath. 360 mL 5  . apixaban (ELIQUIS) 5 MG TABS tablet Take 1 tablet (5 mg total) by mouth 2 (two) times daily. 60 tablet 5  . baclofen (LIORESAL) 10 MG tablet TAKE 1 TABLET BY MOUTH THREE TIMES DAILY AS NEEDED FOR MUSCLE SPASMS 90 tablet 5  . benzonatate (TESSALON) 200 MG capsule Take 1 capsule (200 mg total) by mouth 3 (three) times daily as needed for cough. 30 capsule 1  . cetirizine (ZYRTEC) 10 MG tablet Take 1 tablet (10 mg total) by mouth every morning. 30 tablet 5  . Cyanocobalamin (VITAMIN B 12 PO) Take 1,000 mg by mouth daily.     . DULoxetine (CYMBALTA) 30 MG capsule Take 1 capsule (30 mg total) by mouth daily. (  Patient taking differently: Take 30 mg by mouth at bedtime. ) 30 capsule 2  . Ferrous Sulfate (IRON) 325 (65 Fe) MG TABS Take 3 tablets (975 mg total) by mouth every morning. 90 tablet 5  . fluticasone (FLONASE) 50 MCG/ACT nasal spray SHAKE LIQUID AND USE 1 SPRAY IN EACH NOSTRIL DAILY--  in am 16 g 2  . Fluticasone-Salmeterol (ADVAIR DISKUS) 250-50 MCG/DOSE AEPB Inhale 1 puff into the lungs 2 (two) times daily as needed. 60 each 5  . hydroxychloroquine (PLAQUENIL) 200 MG tablet Take 200 mg by mouth daily.    . methocarbamol (ROBAXIN) 500 MG tablet Take 1 tablet (500 mg  total) by mouth 2 (two) times daily as needed for muscle spasms. 60 tablet 1  . montelukast (SINGULAIR) 10 MG tablet Take 1 tablet (10 mg total) by mouth at bedtime. 30 tablet 6  . omeprazole (PRILOSEC) 40 MG capsule TAKE 1 CAPSULE(40 MG) BY MOUTH DAILY (Patient taking differently: TAKE 1 CAPSULE(40 MG) BY MOUTH DAILY--- takes in am) 30 capsule 0  . oxybutynin (DITROPAN) 5 MG tablet Take 1 tablet by mouth 2 (two) times daily.  11  . PREDNISONE PO Take 5 mg by mouth.     . pregabalin (LYRICA) 75 MG capsule Take 1 capsule (75 mg total) by mouth 3 (three) times daily. 90 capsule 1  . timolol (TIMOPTIC) 0.5 % ophthalmic solution Place 1 drop into both eyes every morning.    . topiramate (TOPAMAX) 25 MG tablet TK 3 TS PO D----- per pt takes 3 tablets qhs  1  . Vitamin D, Ergocalciferol, (DRISDOL) 50000 units CAPS capsule TAKE 1 CAPSULE BY MOUTH every 10 days. 10 capsule 1  . aspirin EC 81 MG tablet Take 81 mg by mouth daily.     No facility-administered medications prior to visit.     Review of Systems  Review of Systems  Constitutional: Positive for fatigue. Negative for chills and fever.  HENT: Negative.   Respiratory: Positive for shortness of breath. Negative for apnea, cough, choking, chest tightness, wheezing and stridor.   Cardiovascular: Negative.   Neurological: Positive for weakness.    Physical Exam  BP 124/90 (BP Location: Left Arm, Cuff Size: Normal)   Pulse 98   Temp 98.7 F (37.1 C) (Oral)   Ht 5\' 10"  (1.778 m)   Wt (!) 310 lb (140.6 kg)   LMP 04/18/2018 (Approximate)   SpO2 99%   BMI 44.48 kg/m  Physical Exam Constitutional:      Appearance: Normal appearance. Holly Mcdonald is obese.     Comments: Appears fatigued  HENT:     Head: Normocephalic and atraumatic.     Mouth/Throat:     Mouth: Mucous membranes are moist.     Pharynx: Oropharynx is clear.  Eyes:     Extraocular Movements: Extraocular movements intact.     Pupils: Pupils are equal, round, and reactive to  light.  Cardiovascular:     Rate and Rhythm: Regular rhythm.     Comments: Regular rhythm, HR 97-99  Pulmonary:     Effort: Pulmonary effort is normal.     Breath sounds: No wheezing or rhonchi.     Comments: CTA, diminished  Neurological:     Mental Status: Holly Mcdonald is alert.      Lab Results:  CBC    Component Value Date/Time   WBC 7.1 04/26/2018 0850   RBC 4.04 04/26/2018 0850   HGB 11.2 (L) 04/26/2018 0850   HCT 35.2 (L) 04/26/2018 7169  PLT 286 04/26/2018 0850   MCV 87.1 04/26/2018 0850   MCH 27.7 04/26/2018 0850   MCHC 31.8 04/26/2018 0850   RDW 12.9 04/26/2018 0850   LYMPHSABS 2.3 04/26/2018 0850   MONOABS 0.6 04/26/2018 0850   EOSABS 0.0 04/26/2018 0850   BASOSABS 0.0 04/26/2018 0850    BMET    Component Value Date/Time   NA 139 04/26/2018 0850   K 3.8 04/26/2018 0850   CL 110 04/26/2018 0850   CO2 21 (L) 04/26/2018 0850   GLUCOSE 98 04/26/2018 0850   BUN 8 04/26/2018 0850   CREATININE 0.83 04/26/2018 0850   CALCIUM 9.3 04/26/2018 0850   GFRNONAA >60 04/26/2018 0850   GFRAA >60 04/26/2018 0850    BNP No results found for: BNP  ProBNP No results found for: PROBNP  Imaging: Dg Chest 2 View  Result Date: 05/08/2018 CLINICAL DATA:  Shortness of breath and chest tightness. EXAM: CHEST - 2 VIEW COMPARISON:  Chest x-ray dated November 18, 2017. FINDINGS: The heart size and mediastinal contours are within normal limits. Both lungs are clear. The visualized skeletal structures are unremarkable. IMPRESSION: No active cardiopulmonary disease. Electronically Signed   By: Titus Dubin M.D.   On: 05/08/2018 16:35     Assessment & Plan:   Asthma in adult, unspecified asthma severity, uncomplicated - Mild asthma exacerbation - Chest tightness improved after xopenex neb treatment - Rx prednisone taper  Bilateral pulmonary embolism ([place]) - Continues lifelong anticoagulation with Eliquis   Shortness of breath - CXR today with no acute abnormality  - Influenza  swab negative  - Labs pending (hgb 11.2 on 12/18)    Martyn Ehrich, NP 05/09/2018

## 2018-05-08 NOTE — Patient Instructions (Addendum)
Office treatment: - Xopenex nebulizer x1   Office testing: - FENO mildly elevated - Flu swab was negative   Order: - Labs today  - CXR today to rule out pneumonia or pleural effusion   RX: - Prednisone taper as prescribed for mild asthma exacerbation  Recommendations: - Encourage oral hydration (aim for eight 8oz glasses of fluid or 2 liters daily) and make sure to get plenty of rest  - Follow-up with rheumatology

## 2018-05-09 ENCOUNTER — Telehealth: Payer: Self-pay | Admitting: Primary Care

## 2018-05-09 ENCOUNTER — Encounter: Payer: Self-pay | Admitting: Primary Care

## 2018-05-09 LAB — CBC WITH DIFFERENTIAL/PLATELET
Basophils Absolute: 0.1 10*3/uL (ref 0.0–0.1)
Basophils Relative: 1.2 % (ref 0.0–3.0)
Eosinophils Absolute: 0 10*3/uL (ref 0.0–0.7)
Eosinophils Relative: 0.5 % (ref 0.0–5.0)
HCT: 39.6 % (ref 36.0–46.0)
Hemoglobin: 13.1 g/dL (ref 12.0–15.0)
Lymphocytes Relative: 44 % (ref 12.0–46.0)
Lymphs Abs: 3.8 10*3/uL (ref 0.7–4.0)
MCHC: 33.1 g/dL (ref 30.0–36.0)
MCV: 85.7 fl (ref 78.0–100.0)
Monocytes Absolute: 0.5 10*3/uL (ref 0.1–1.0)
Monocytes Relative: 6.2 % (ref 3.0–12.0)
Neutro Abs: 4.2 10*3/uL (ref 1.4–7.7)
Neutrophils Relative %: 48.1 % (ref 43.0–77.0)
Platelets: 281 10*3/uL (ref 150.0–400.0)
RBC: 4.62 Mil/uL (ref 3.87–5.11)
RDW: 14.5 % (ref 11.5–15.5)
WBC: 8.7 10*3/uL (ref 4.0–10.5)

## 2018-05-09 LAB — BASIC METABOLIC PANEL
BUN: 14 mg/dL (ref 6–23)
CALCIUM: 9.6 mg/dL (ref 8.4–10.5)
CO2: 20 mEq/L (ref 19–32)
CREATININE: 0.84 mg/dL (ref 0.40–1.20)
Chloride: 110 mEq/L (ref 96–112)
GFR: 95.84 mL/min (ref >60.00)
Glucose, Bld: 96 mg/dL (ref 70–99)
Potassium: 4.2 mEq/L (ref 3.5–5.1)
Sodium: 139 mEq/L (ref 135–145)

## 2018-05-09 NOTE — Assessment & Plan Note (Signed)
-   CXR today with no acute abnormality  - Influenza swab negative  - Labs pending (hgb 11.2 on 12/18)

## 2018-05-09 NOTE — Assessment & Plan Note (Addendum)
-   Mild asthma exacerbation - Chest tightness improved after xopenex neb treatment - Rx prednisone taper

## 2018-05-09 NOTE — Telephone Encounter (Signed)
Called and spoke with patient, she is aware of results and verbalized understanding. Nothing further needed.  

## 2018-05-09 NOTE — Assessment & Plan Note (Addendum)
-   Continues lifelong anticoagulation with Eliquis

## 2018-05-13 ENCOUNTER — Inpatient Hospital Stay: Payer: 59 | Attending: Hematology

## 2018-05-13 VITALS — BP 115/82 | HR 84 | Temp 98.2°F | Resp 16

## 2018-05-13 DIAGNOSIS — Z79899 Other long term (current) drug therapy: Secondary | ICD-10-CM | POA: Insufficient documentation

## 2018-05-13 DIAGNOSIS — D500 Iron deficiency anemia secondary to blood loss (chronic): Secondary | ICD-10-CM

## 2018-05-13 MED ORDER — SODIUM CHLORIDE 0.9 % IV SOLN
510.0000 mg | Freq: Once | INTRAVENOUS | Status: AC
  Administered 2018-05-13: 510 mg via INTRAVENOUS
  Filled 2018-05-13: qty 17

## 2018-05-13 MED ORDER — SODIUM CHLORIDE 0.9 % IV SOLN
Freq: Once | INTRAVENOUS | Status: AC
  Administered 2018-05-13: 10:00:00 via INTRAVENOUS
  Filled 2018-05-13: qty 250

## 2018-05-13 NOTE — Patient Instructions (Signed)

## 2018-05-17 ENCOUNTER — Encounter: Payer: 59 | Attending: Physical Medicine & Rehabilitation | Admitting: Physical Medicine & Rehabilitation

## 2018-05-17 ENCOUNTER — Other Ambulatory Visit: Payer: Self-pay

## 2018-05-17 ENCOUNTER — Encounter: Payer: Self-pay | Admitting: Physical Medicine & Rehabilitation

## 2018-05-17 VITALS — BP 124/86 | HR 109 | Ht 70.0 in | Wt 318.0 lb

## 2018-05-17 DIAGNOSIS — R251 Tremor, unspecified: Secondary | ICD-10-CM | POA: Insufficient documentation

## 2018-05-17 DIAGNOSIS — Z86711 Personal history of pulmonary embolism: Secondary | ICD-10-CM | POA: Insufficient documentation

## 2018-05-17 DIAGNOSIS — J449 Chronic obstructive pulmonary disease, unspecified: Secondary | ICD-10-CM | POA: Diagnosis not present

## 2018-05-17 DIAGNOSIS — M797 Fibromyalgia: Secondary | ICD-10-CM | POA: Diagnosis not present

## 2018-05-17 DIAGNOSIS — G894 Chronic pain syndrome: Secondary | ICD-10-CM | POA: Diagnosis not present

## 2018-05-17 DIAGNOSIS — M791 Myalgia, unspecified site: Secondary | ICD-10-CM

## 2018-05-17 DIAGNOSIS — Z6841 Body Mass Index (BMI) 40.0 and over, adult: Secondary | ICD-10-CM | POA: Diagnosis not present

## 2018-05-17 DIAGNOSIS — R269 Unspecified abnormalities of gait and mobility: Secondary | ICD-10-CM | POA: Diagnosis not present

## 2018-05-17 DIAGNOSIS — D219 Benign neoplasm of connective and other soft tissue, unspecified: Secondary | ICD-10-CM | POA: Diagnosis not present

## 2018-05-17 DIAGNOSIS — F419 Anxiety disorder, unspecified: Secondary | ICD-10-CM | POA: Diagnosis not present

## 2018-05-17 DIAGNOSIS — H42 Glaucoma in diseases classified elsewhere: Secondary | ICD-10-CM | POA: Insufficient documentation

## 2018-05-17 DIAGNOSIS — M545 Low back pain: Secondary | ICD-10-CM | POA: Diagnosis not present

## 2018-05-17 DIAGNOSIS — G479 Sleep disorder, unspecified: Secondary | ICD-10-CM

## 2018-05-17 DIAGNOSIS — G43909 Migraine, unspecified, not intractable, without status migrainosus: Secondary | ICD-10-CM | POA: Insufficient documentation

## 2018-05-17 DIAGNOSIS — E1139 Type 2 diabetes mellitus with other diabetic ophthalmic complication: Secondary | ICD-10-CM | POA: Diagnosis not present

## 2018-05-17 DIAGNOSIS — M359 Systemic involvement of connective tissue, unspecified: Secondary | ICD-10-CM

## 2018-05-17 NOTE — Progress Notes (Signed)
Subjective:    Patient ID: Holly Mcdonald, female    DOB: 09/18/1975, 43 y.o.   MRN: [id number]  HPI 43 year old female with past medical history of tremors, pulmonary embolism, morbid obesity, migraines, fibromyalgia, prediabetes, asthma, fibroids, chronic bronchitis presents with fibromyalgia.  Initially stated: Started 10/2016 after diagnosed PE.  Progressively getting worse.  Heat improved the pain.  Fibroids have exacerbated the pain, with surgery planned for 10/17/17.  Activity exacerbates the pain.  All qualities of pain.  Non-radiating.  Constant.  Associated muscle spasms and tingling.  Tylenol does not help.  Cymbalta provides some benefit. Denies falls. Pain limits all activities.   Works as Therapist, art.    Last clinic visit 02/08/18.  Since that time, pt states she did not have hysterectomy due to risks of surgery.  She has the TENS unit, but has not needed to use it.  She has benefit with Robaxin. She continues to follow up with Rheum.  She rescheduled her Psych appointment. She states she has not lost weight, but has not gained too much.  Pain Inventory Average Pain 2 Pain Right Now 2 My pain is intermittent, dull and aching  In the last 24 hours, has pain interfered with the following? General activity 2 Relation with others 0 Enjoyment of life 0 What TIME of day is your pain at its worst? all Sleep (in general) Good  Pain is worse with: walking, sitting, standing and some activites Pain improves with: rest, heat/ice, therapy/exercise, pacing activities and medication Relief from Meds: 2  Mobility walk without assistance how many minutes can you walk? 15-20 ability to climb steps?  yes do you drive?  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  no  Physicians involved in your care Primary care  Dr. [name]   Family History  Problem Relation Age of Onset  . Pulmonary embolism Mother        Last year  . Mental retardation Mother        PTSD and fibromyalgia  . Heart disease Mother        CHF  . Congestive Heart Failure Mother   . Heart disease Father 45       CAD  . Hyperlipidemia Father   . Diabetes Paternal Grandmother   . Heart attack Paternal Grandmother   . Congestive Heart Failure Maternal Aunt   . Cancer Neg Hx    Social History   Socioeconomic History  . Marital status: Divorced    Spouse name: Not on file  . Number of children: Not on file  . Years of education: Not on file  . Highest education level: Not on file  Occupational History  . Occupation: representative    Comment: [employer]  Social Needs  . Financial resource strain: Not on file  . Food insecurity:    Worry: Not on file    Inability: Not on file  . Transportation needs:    Medical: Not on file    Non-medical: Not on file  Tobacco Use  . Smoking status: Never Smoker  . Smokeless tobacco: Never Used  Substance and Sexual Activity  . Alcohol use: Yes    Comment: occasionally  . Drug use: No  . Sexual activity: Yes    Birth control/protection: None, I.U.D.    Comment: 1st intercourse 43 yo-More than 5 partners Mirena 09/16/2017  Lifestyle  . Physical activity:    Days per week: Not on file    Minutes per session: Not on file  . Stress: Not on file  Relationships  . Social connections:    Talks on phone: Not on file    Gets together: Not on file    Attends religious service: Not on file    Active member of club or organization: Not on file    Attends meetings of clubs or organizations: Not on file    Relationship status: Not on file  Other Topics Concern  . Not on file  Social History Narrative  . Not on file   Past Surgical History:  Procedure Laterality Date  . ANAL EXAMINATION UNDER ANESTHESIA  age 39   w/ removal cyst  . DILATATION & CURETTAGE/HYSTEROSCOPY WITH MYOSURE  N/A 10/17/2017   Procedure: [place];  Surgeon: Anastasio Auerbach, MD;  Location: [facility];  Service: Gynecology;  Laterality: N/A;  . DILATATION & CURETTAGE/HYSTEROSCOPY WITH MYOSURE N/A 04/11/2018   Procedure: DILATATION & CURETTAGE/HYSTEROSCOPY WITH MYOSURE;  Surgeon: Anastasio Auerbach, MD;  Location: [facility];  Service: Gynecology;  Laterality: N/A;  . GYNECOLOGIC CRYOSURGERY  age 23s  . INTRAUTERINE DEVICE INSERTION  04/18/2018  . TONSILLECTOMY  age 45  . TRANSTHORACIC ECHOCARDIOGRAM  01/17/2017   ef 60-65%/  mild TR   Past Medical History:  Diagnosis Date  . Anticoagulated    eliquis  . Arthritis    inflammatory  . Bladder spasm   . Connective tissue disease ([place]) dx oct 2019  . Fibromyalgia   . GERD (gastroesophageal reflux disease)   . Heavy menstrual bleeding    resolved with megace  . History of chronic bronchitis    last flare up 11-18-17  . History of pulmonary embolism 10/2016--- followed by pcp and pulmoloigst   dx bilateral PE , bilateral lower lobes , segmental , unprovoked, negative hypercoagulable work-up-- treated w/ eliquis  . Iron deficiency anemia   . Migraine   . Mild intermittent asthma    pulmologist-  dr Elsworth Soho  . Morbid obesity with BMI of 45.0-49.9, adult ([place])   . Open-angle glaucoma of both eyes   . Pre-diabetes   . 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 Urgency of urination   . Uterine fibroid   . Vitamin D deficiency   . Wears glasses    BP 124/86   Pulse (!) 109   Ht 5\' 10"  (1.778 m)   Wt (!) 318 lb (144.2 kg)   LMP 04/18/2018 (Approximate)   SpO2 97%   BMI 45.63 kg/m   Opioid Risk Score:   Fall Risk Score:  `1  Depression screen PHQ 2/9  Depression screen [facility] 2/9 05/17/2018 01/12/2018 10/07/2017  Decreased Interest 0 0 0  Down, Depressed, Hopeless 0 0 0  PHQ - 2 Score 0 0 0  Altered sleeping - - 1  Tired, decreased energy - - 3    Change in appetite - - 3  Feeling bad or failure about yourself  - - 0  Trouble concentrating - - 0  Moving slowly or fidgety/restless - - 0  Suicidal thoughts - - 0  PHQ-9 Score - - 7  Difficult doing work/chores - - Somewhat difficult   Review of Systems  Constitutional: Negative.   HENT: Negative.   Eyes: Negative.   Respiratory: Negative.   Cardiovascular: Negative.   Gastrointestinal: Negative.   Endocrine:  Negative.   Genitourinary: Positive for difficulty urinating.  Musculoskeletal: Positive for arthralgias, back pain, myalgias and neck pain.  Skin: Negative.   Allergic/Immunologic: Negative.   Neurological: Positive for tremors and numbness.       Tingling  Hematological: Negative.   Psychiatric/Behavioral: Negative.   All other systems reviewed and are negative.     Objective:   Physical Exam Gen: Vital signs reviewed. NAD. HENT: Normocephalic, Atraumatic Eyes: EOMI. No discharge.  Cardio: RRR. No JVD. Pulm: B/l clear to auscultation.  Effort normal. Abd: Nondistended, BS+ MSK:  Gait antalgic, improved  +TTP diffusely with delayed hypersensitivity  No edema.  Neuro:  Strength  4-/5 in all UE myotomes (pain inhibition) Skin: Warm and Dry. Intact. Tattoo on right arm and left upper back.    Assessment & Plan:  43 year old female with past medical history of tremors, pulmonary embolism, morbid obesity, migraines, fibromyalgia, prediabetes, asthma, fibroids, chronic bronchitis presents with pain all over.   1. Chronic diffuse pain  Multifactorial - Fibromyalgia, connective tissue disorder, inflammatory arthritis, fibroids  Side effects with Gabapentin  Neck xray reviewed, showing mild disc narrowing at C7-T1.  Cont Heat, trial cold  Encouraged pool therapy (completed aquatic therapy)  TENS IT, states she has not needed to use it yet  Cont Lidoderm OTC   Cont Lyrica 75 TID  Cont Baclofen 10 qhs, unable to tolerate during the day  Cont Robaxin 500 BID  PRN  Follow up with Psychology - appointment rescheduled for Feb  Collow up with Rheum - started on Plaquinil   Will consider accupuncture if necessary  Will consider NSAID if necessary   2. Gait abnormality  Does not believe she needs cane at present  3. Sleep disturbance  Cont meds  4. Morbid Obesity  Cont to follow up with nutritionist  No weight loss at present  5. Myalgia   Will consider trigger point injections when able to tolerate  See #1  6. Migraines  Cont Topamax

## 2018-05-19 ENCOUNTER — Ambulatory Visit (INDEPENDENT_AMBULATORY_CARE_PROVIDER_SITE_OTHER): Payer: 59 | Admitting: Gynecology

## 2018-05-19 ENCOUNTER — Encounter: Payer: Self-pay | Admitting: Gynecology

## 2018-05-19 ENCOUNTER — Other Ambulatory Visit: Payer: Self-pay | Admitting: Physical Medicine & Rehabilitation

## 2018-05-19 VITALS — BP 130/80

## 2018-05-19 DIAGNOSIS — Z30431 Encounter for routine checking of intrauterine contraceptive device: Secondary | ICD-10-CM | POA: Diagnosis not present

## 2018-05-19 NOTE — Progress Notes (Signed)
    Holly Mcdonald Mar 15, 1976 [id number]        43 y.o.  G0P0000 presents for IUD follow-up.  Had Mirena placed 43 y.o. Has had spotting on and off since.  No heavy bleeding.  Past medical history,surgical history, problem list, medications, allergies, family history and social history were all reviewed and documented in the EPIC chart.  Directed ROS with pertinent positives and negatives documented in the history of present illness/assessment and plan.  Exam: [name] assistant Vitals:   05/19/18 0826  BP: 130/80   General appearance:  Normal Abdomen obese without masses or tenderness Pelvic external BUS vagina with no bleeding.  Cervix normal with IUD string visualized and appropriate length.  Bimanual without gross masses or tenderness.  Assessment/Plan:  43 y.o. G0P0000 with normal IUD follow-up exam.  We discussed her spotting.  Hopefully this will resolve over the next month or so.  We discussed the possibility of ongoing spotting giving that she is on anticoagulant with her leiomyoma but as long as were avoiding heavy bleeding.  Patient has annual exam scheduled in September and will follow-up for this.  She will be present sooner if any issues.    Anastasio Auerbach MD, 8:48 AM 05/19/2018

## 2018-05-19 NOTE — Patient Instructions (Signed)
Follow up for annual in September as scheduled

## 2018-05-20 ENCOUNTER — Other Ambulatory Visit: Payer: Self-pay | Admitting: Family Medicine

## 2018-05-20 DIAGNOSIS — K219 Gastro-esophageal reflux disease without esophagitis: Secondary | ICD-10-CM

## 2018-05-24 ENCOUNTER — Telehealth: Payer: Self-pay | Admitting: Pulmonary Disease

## 2018-05-24 ENCOUNTER — Inpatient Hospital Stay: Payer: 59

## 2018-05-24 VITALS — BP 113/73 | HR 98 | Temp 98.3°F | Resp 16

## 2018-05-24 DIAGNOSIS — D500 Iron deficiency anemia secondary to blood loss (chronic): Secondary | ICD-10-CM

## 2018-05-24 MED ORDER — SODIUM CHLORIDE 0.9 % IV SOLN
510.0000 mg | Freq: Once | INTRAVENOUS | Status: AC
  Administered 2018-05-24: 510 mg via INTRAVENOUS
  Filled 2018-05-24: qty 17

## 2018-05-24 MED ORDER — SODIUM CHLORIDE 0.9 % IV SOLN
Freq: Once | INTRAVENOUS | Status: AC
  Administered 2018-05-24: 09:00:00 via INTRAVENOUS
  Filled 2018-05-24: qty 250

## 2018-05-24 NOTE — Telephone Encounter (Signed)
Can fill

## 2018-05-24 NOTE — Telephone Encounter (Signed)
Patient was last seen by EW on 05/08/18 and by you on 12/21/17 for asthma.   RA, please advise if you are willing to complete her FMLA forms.

## 2018-05-24 NOTE — Patient Instructions (Signed)

## 2018-05-24 NOTE — Telephone Encounter (Signed)
I have given paperwork to [facility], will route this to her to follow up on.

## 2018-05-25 NOTE — Telephone Encounter (Signed)
Left message for patient. Forms have been filled out.

## 2018-05-26 NOTE — Telephone Encounter (Signed)
Attempted to contact pt. I did not receive an answer. I have left a message for pt to return our call.  

## 2018-05-29 ENCOUNTER — Other Ambulatory Visit: Payer: Self-pay | Admitting: Gynecology

## 2018-05-29 ENCOUNTER — Other Ambulatory Visit: Payer: Self-pay | Admitting: Family Medicine

## 2018-05-29 DIAGNOSIS — M797 Fibromyalgia: Secondary | ICD-10-CM

## 2018-05-29 NOTE — Telephone Encounter (Signed)
Spoke with pt. She is aware that these forms have been filled out. Nothing further was needed at this time.

## 2018-06-01 ENCOUNTER — Telehealth: Payer: Self-pay | Admitting: Pulmonary Disease

## 2018-06-01 NOTE — Telephone Encounter (Signed)
Forms were never due to never receiving an answer on whether or not she wanted [place] to fax them or if she wanted [place] to mail them back to her. I will fax forms today.   Patient is aware. Nothing further needed at time of call.

## 2018-06-01 NOTE — Telephone Encounter (Signed)
Phone note dated 05/24/2018 states forms done  [facility] telling pt we will check with RA's nurse about the forms  Cherina- did you fax these or send somewhere? She is calling stating they were not sent and they are due tomorrow  Thanks

## 2018-06-07 ENCOUNTER — Telehealth: Payer: Self-pay | Admitting: Pulmonary Disease

## 2018-06-07 NOTE — Telephone Encounter (Signed)
Spoke with Holly Mcdonald in regards to these forms. She states that they were faxed in for the pt per her request last week. Holly Mcdonald still has these forms in the office and is going to place them up front to be picked up.  Spoke with pt. She is aware of this information. Nothing further was needed at this time.

## 2018-06-07 NOTE — Telephone Encounter (Signed)
Patient had to leave and stated that she can pick up [facility] paperwork tomorrow. CB is [phone]

## 2018-06-14 ENCOUNTER — Telehealth: Payer: Self-pay | Admitting: Pulmonary Disease

## 2018-06-14 NOTE — Telephone Encounter (Signed)
[employer] and spoke with Chrissie Noa.  Dr Bari Mantis condition and duration given.  He stated that condition asthma can be used, but duration undetermined can not.  He said they need a time, e.g.6 months, lifetime. He said duration can  be called and changed.  Dr Elsworth Soho, please advise on duration

## 2018-06-14 NOTE — Telephone Encounter (Signed)
Please use what I said last time when I filled out the form If unable to find, then can say 5 years

## 2018-06-14 NOTE — Telephone Encounter (Signed)
Spoke with Holly Mcdonald at [facility] and she needed clarifiation on question 1 on FMLA form which is duration of condition. She said it could be undetermined based on treatment, lifetime, unknown or whatever best fits.   Dr. Elsworth Soho please advise what you would like the response to be?

## 2018-06-14 NOTE — Telephone Encounter (Signed)
[employer], and spoke with [place]. Dr Bari Mantis condition and duration given.  Understanding stated.  Nothing further at this time.

## 2018-06-14 NOTE — Telephone Encounter (Signed)
Condition is asthma. Duration is undetermined

## 2018-06-16 ENCOUNTER — Encounter: Payer: Self-pay | Admitting: Psychology

## 2018-06-16 ENCOUNTER — Encounter: Payer: 59 | Attending: Physical Medicine & Rehabilitation | Admitting: Psychology

## 2018-06-16 DIAGNOSIS — G43909 Migraine, unspecified, not intractable, without status migrainosus: Secondary | ICD-10-CM | POA: Insufficient documentation

## 2018-06-16 DIAGNOSIS — Z86711 Personal history of pulmonary embolism: Secondary | ICD-10-CM | POA: Insufficient documentation

## 2018-06-16 DIAGNOSIS — G894 Chronic pain syndrome: Secondary | ICD-10-CM

## 2018-06-16 DIAGNOSIS — M359 Systemic involvement of connective tissue, unspecified: Secondary | ICD-10-CM

## 2018-06-16 DIAGNOSIS — H42 Glaucoma in diseases classified elsewhere: Secondary | ICD-10-CM | POA: Insufficient documentation

## 2018-06-16 DIAGNOSIS — D219 Benign neoplasm of connective and other soft tissue, unspecified: Secondary | ICD-10-CM | POA: Insufficient documentation

## 2018-06-16 DIAGNOSIS — M545 Low back pain: Secondary | ICD-10-CM | POA: Insufficient documentation

## 2018-06-16 DIAGNOSIS — E1139 Type 2 diabetes mellitus with other diabetic ophthalmic complication: Secondary | ICD-10-CM | POA: Insufficient documentation

## 2018-06-16 DIAGNOSIS — R269 Unspecified abnormalities of gait and mobility: Secondary | ICD-10-CM | POA: Diagnosis not present

## 2018-06-16 DIAGNOSIS — R251 Tremor, unspecified: Secondary | ICD-10-CM | POA: Insufficient documentation

## 2018-06-16 DIAGNOSIS — Z6841 Body Mass Index (BMI) 40.0 and over, adult: Secondary | ICD-10-CM | POA: Insufficient documentation

## 2018-06-16 DIAGNOSIS — M797 Fibromyalgia: Secondary | ICD-10-CM | POA: Diagnosis not present

## 2018-06-16 DIAGNOSIS — G479 Sleep disorder, unspecified: Secondary | ICD-10-CM | POA: Insufficient documentation

## 2018-06-16 DIAGNOSIS — F419 Anxiety disorder, unspecified: Secondary | ICD-10-CM | POA: Diagnosis not present

## 2018-06-16 DIAGNOSIS — J449 Chronic obstructive pulmonary disease, unspecified: Secondary | ICD-10-CM | POA: Insufficient documentation

## 2018-06-16 NOTE — Progress Notes (Signed)
Neuropsychological Consultation   Patient:   Holly Mcdonald   DOB:   1975/07/09  MR Number:  [id number]  Location:  [facility] [place], [place] [id number] [place] [address] Dept: [phone]           Date of Service:   06/16/2018  Start Time:   10 AM End Time:   11 AM  Provider/Observer:  Ilean Skill, Psy.D.       Clinical Neuropsychologist       Billing Code/Service: [phone] 4 Units  Chief Complaint:    Holly Mcdonald is a 43 year old female referred by Dr. Posey Pronto for neuropsychological consultation and therapy.  The patient has been dealing with significant fibromyalgia and other chronic pain conditions and chronic medical conditions for nearly 2 years now.  The patient has been diagnosed with fibromyalgia, autoimmune disorder, migraines, fibroids, inflammatory arthritis, and connective tissue disease.  Reason for Service:  Holly Mcdonald is a 43 year old female referred by Dr. Posey Pronto for neuropsychological consultation and therapy.  The patient has been dealing with significant fibromyalgia and other chronic pain conditions and chronic medical conditions for nearly 2 years now.  The patient has been diagnosed with fibromyalgia, autoimmune disorder, migraines, fibroids, inflammatory arthritis, and connective tissue disease.  The patient reports that in June 2018 she had a pulmonary embolism and began having significant problems following that.  The patient reports that the pain began throughout her body shortly after this that is now been diagnosed as fibromyalgia and was diagnosed with fibromyalgia in August 2018.  The patient had been in what she describes as a "flareup" for 2 months.  The patient reports that she is also been seen by rheumatologist who diagnosed her with an autoimmune disorder in September 2019.  The patient has migraines but are well  managed as well as recent surgeries for fibroids.  The patient is also had bladder issues as well as retinal hemorrhage in her eye.  The patient does report that she is hopeful that now they are starting to identify some of the issues that are going on she is still very stressed by both some of the medical issues that she is gone in and their severity as well as worried that the concerned that she may develop lupus or other progression in her autoimmune issues looms heavily over her.  Current Status:   The patient does report that she has had some anxiety and depression symptoms in response to some of her complex medical issues.  The patient was referred for therapeutic interventions to help better manage and cope with her current situation.  Reliability of Information: The information is derived from 1 hour face-to-face clinical interview with the patient as well as review of available medical records.  Behavioral Observation: Holly Mcdonald  presents as a 43 y.o.-year-old Right African American Female who appeared her stated age. her dress was Appropriate and she was Well Groomed and her manners were Appropriate to the situation.  her participation was indicative of Appropriate and Attentive behaviors.  There were any physical disabilities noted.  she displayed an appropriate level of cooperation and motivation.     Interactions:    Active Appropriate  Attention:   within normal limits and attention span and concentration were age appropriate  Memory:   within normal limits; recent and remote memory intact  Visuo-spatial:  not examined  Speech (Volume):  low  Speech:   normal;  normal  Thought Process:  Coherent and Relevant  Though Content:  WNL; not suicidal and not homicidal  Orientation:   person, place, time/date and situation  Judgment:   Good  Planning:   Good  Affect:    Anxious  Mood:    Dysphoric  Insight:   Good  Intelligence:   normal  Marital  Status/Living: The patient was born in [place] and has 4 siblings.  No major childhood or developmental issues are noted.  Medical History:   Past Medical History:  Diagnosis Date  . Anticoagulated    eliquis  . Arthritis    inflammatory  . Bladder spasm   . Connective tissue disease ([place]) dx oct 2019  . Fibromyalgia   . GERD (gastroesophageal reflux disease)   . Heavy menstrual bleeding    resolved with megace  . History of chronic bronchitis    last flare up 11-18-17  . History of pulmonary embolism 10/2016--- followed by pcp and pulmoloigst   dx bilateral PE , bilateral lower lobes , segmental , unprovoked, negative hypercoagulable work-up-- treated w/ eliquis  . Iron deficiency anemia   . Migraine   . Mild intermittent asthma    pulmologist-  dr Elsworth Soho  . Morbid obesity with BMI of 45.0-49.9, adult ([place])   . Open-angle glaucoma of both eyes   . Pre-diabetes   . 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 Urgency of urination   . Uterine fibroid   . Vitamin D deficiency   . Wears glasses    Psychiatric History:  The patient has no prior psychiatric history but has had a lot of stress with the development of significant medical issues starting in June 2018 with first major medical issue having to do with a pulmonary embolism.  Family Med/Psych History:  Family History  Problem Relation Age of Onset  . Pulmonary embolism Mother        Last year  . Mental retardation Mother        PTSD and fibromyalgia  . Heart disease Mother        CHF  . Congestive Heart Failure Mother   . Heart disease Father 98       CAD  . Hyperlipidemia Father   . Diabetes Paternal Grandmother   . Heart attack Paternal Grandmother   . Congestive Heart Failure Maternal Aunt   . Cancer Neg Hx     Risk of Suicide/Violence: low the patient denies any suicidal or homicidal ideation.  Impression/DX:  Holly Mcdonald is a 43 year old female referred by Dr. Posey Pronto for  neuropsychological consultation and therapy.  The patient has been dealing with significant fibromyalgia and other chronic pain conditions and chronic medical conditions for nearly 2 years now.  The patient has been diagnosed with fibromyalgia, autoimmune disorder, migraines, fibroids, inflammatory arthritis, and connective tissue disease.  The patient reports that in June 2018 she had a pulmonary embolism and began having significant problems following that.  The patient reports that the pain began throughout her body shortly after this that is now been diagnosed as fibromyalgia and was diagnosed with fibromyalgia in August 2018.  The patient had been in what she describes as a "flareup" for 2 months.  The patient reports that she is also been seen by rheumatologist who diagnosed her with an autoimmune disorder in September 2019.  The patient has migraines but are well managed as well as recent surgeries for fibroids.  The patient is also had bladder issues  as well as retinal hemorrhage in her eye.  The patient does report that she is hopeful that now they are starting to identify some of the issues that are going on she is still very stressed by both some of the medical issues that she is gone in and their severity as well as worried that the concerned that she may develop lupus or other progression in her autoimmune issues looms heavily over her.  The patient does report that she has had some anxiety and depression symptoms in response to some of her complex medical issues.  The patient was referred for therapeutic interventions to help better manage and cope with her current situation.   Disposition/Plan:  We have set the patient up for formal therapeutic interventions to better cope with chronic pain symptoms as well as significant adjustment difficulties due to a myriad of medical issues and chronic pain symptoms.  Diagnosis:    Fibromyalgia  Abnormality of gait  Chronic pain syndrome  Morbid  obesity (H[facility])  Connective tissue disease ([place])  Sleep disorder  Anxiety disorder, unspecified type  Fibromyalgia muscle pain         Electronically Signed   _______________________ Ilean Skill, Psy.D.

## 2018-06-20 ENCOUNTER — Other Ambulatory Visit: Payer: Self-pay | Admitting: Physical Medicine & Rehabilitation

## 2018-06-22 IMAGING — CT CT ANGIO CHEST
2 of 9 series · 19 of 36 positions shown · IV contrast (APPLIED)
Comparison: 11/02/2016 chest radiograph.

CLINICAL DATA: 41 y/o F; severe chest pain, question pneumonia,
shortness of breath with exertion.

EXAM:
CT ANGIOGRAPHY CHEST WITH CONTRAST
TECHNIQUE: Multidetector CT imaging of the chest was performed using the
standard protocol during bolus administration of intravenous
contrast. Multiplanar CT image reconstructions and MIPs were
obtained to evaluate the vascular anatomy.
CONTRAST:  80 cc Isovue 370

[Series 7: thins · axial · 0.68mm/px · z∈[+102,+343]mm · 18 of 271 slices shown]
[im 15/271  lung]
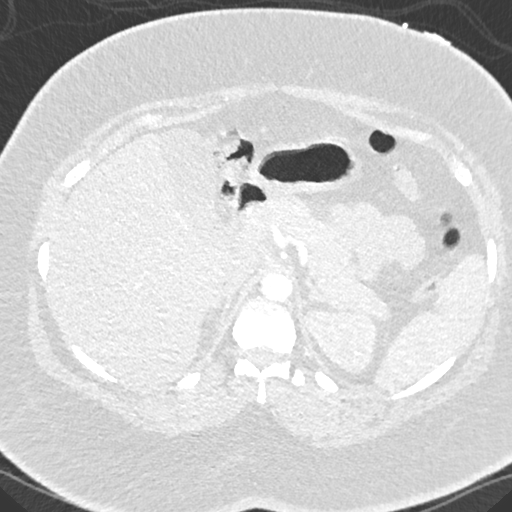
[im 29/271  mediastinal]
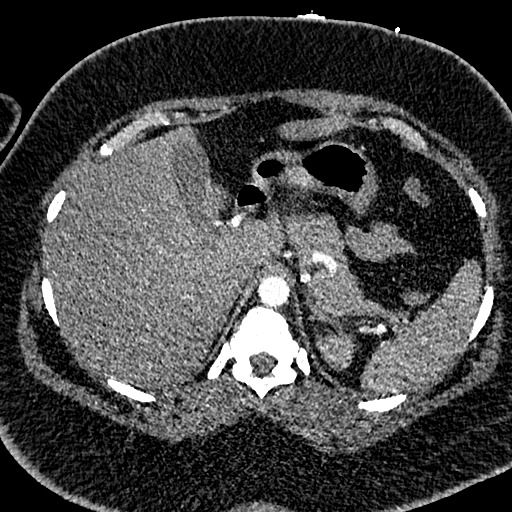
[im 43/271  lung]
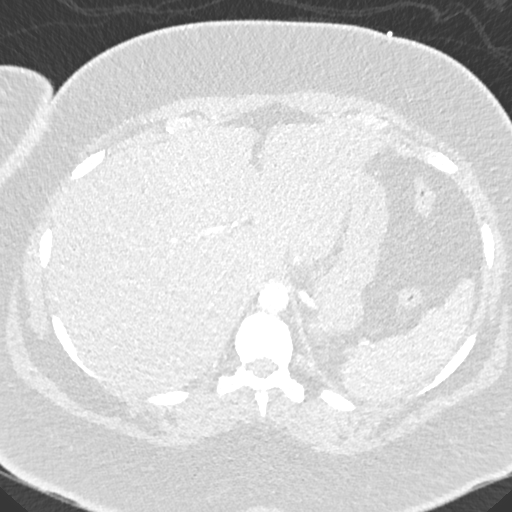
[im 57/271  mediastinal]
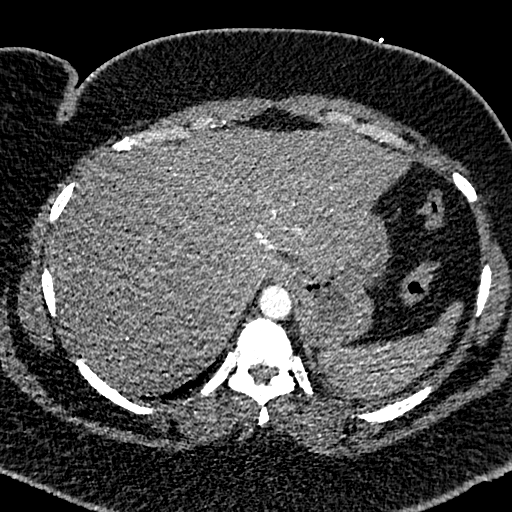
[im 72/271  lung]
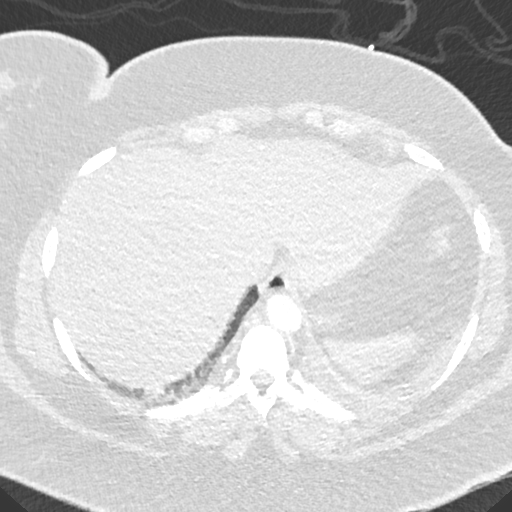
[im 86/271  mediastinal]
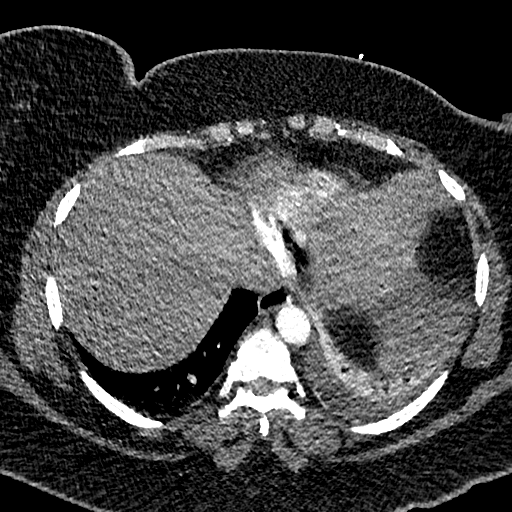
[im 100/271  lung]
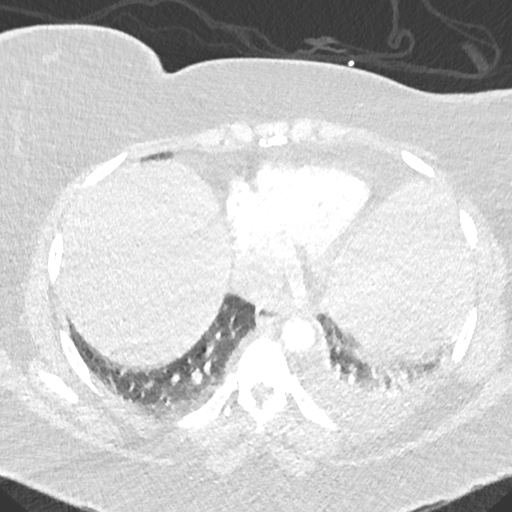
[im 114/271  mediastinal]
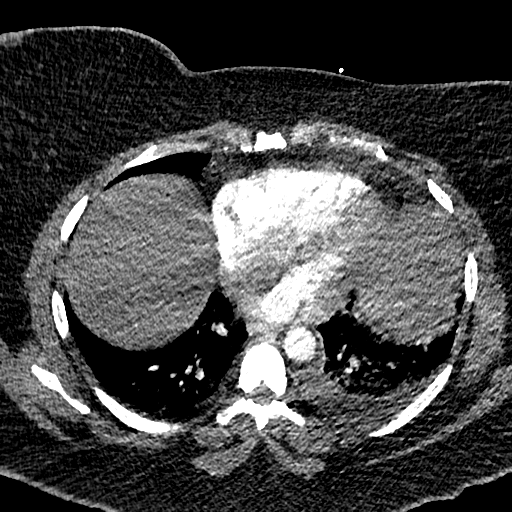
[im 128/271  lung]
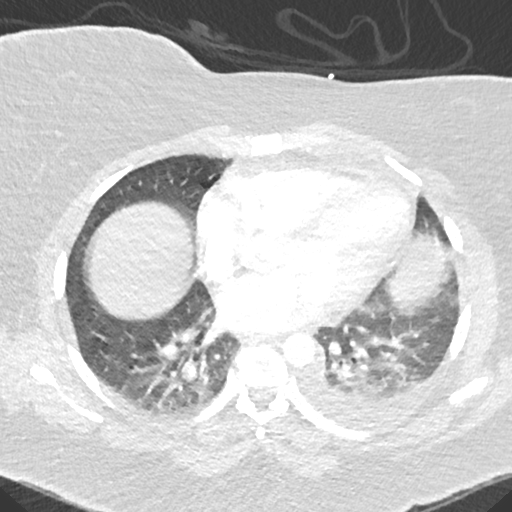
[im 143/271  mediastinal]
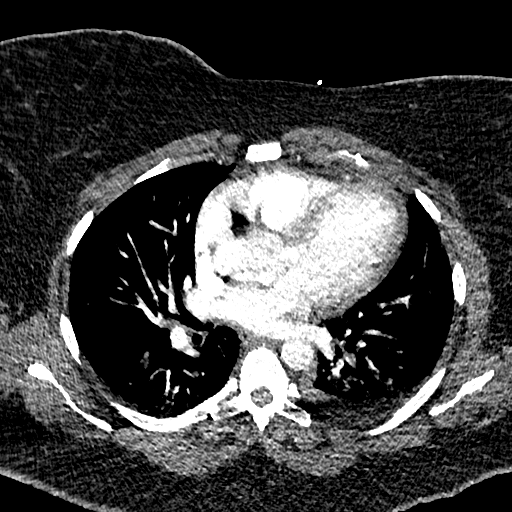
[im 157/271  lung]
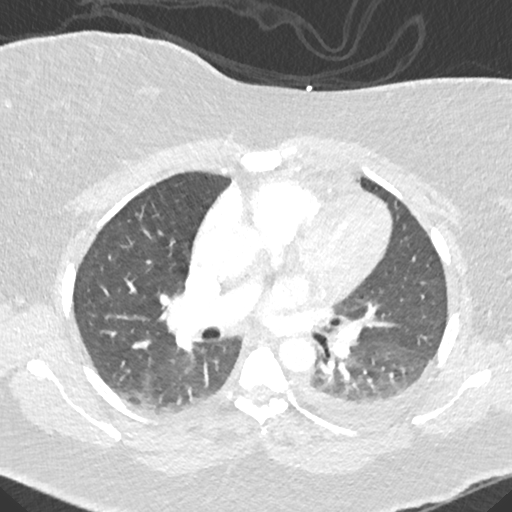
[im 171/271  mediastinal]
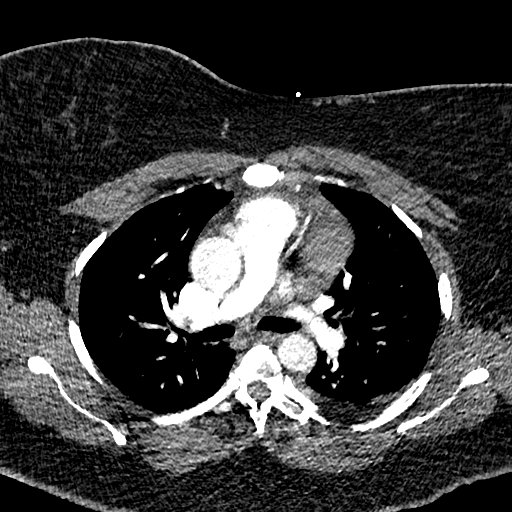
[im 185/271  lung]
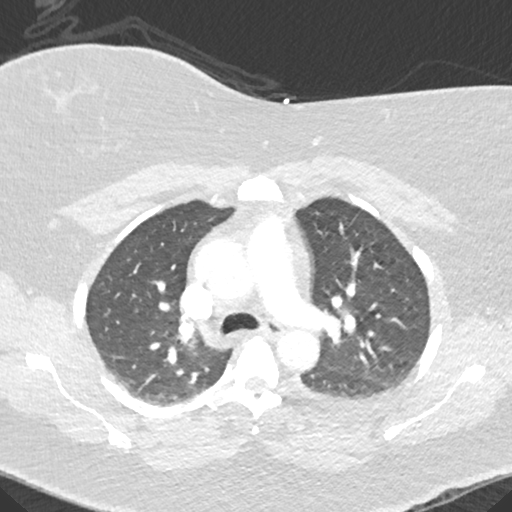
[im 199/271  mediastinal]
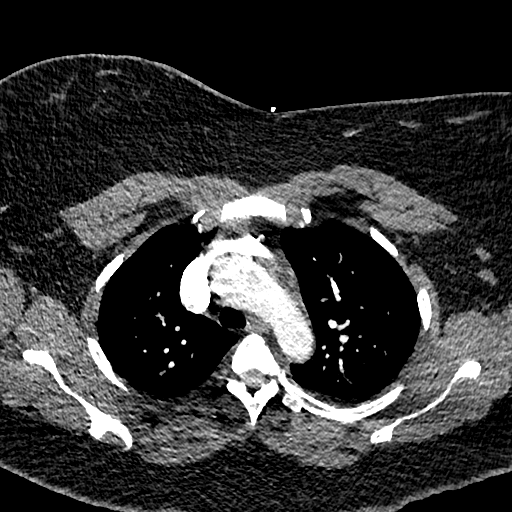
[im 214/271  lung]
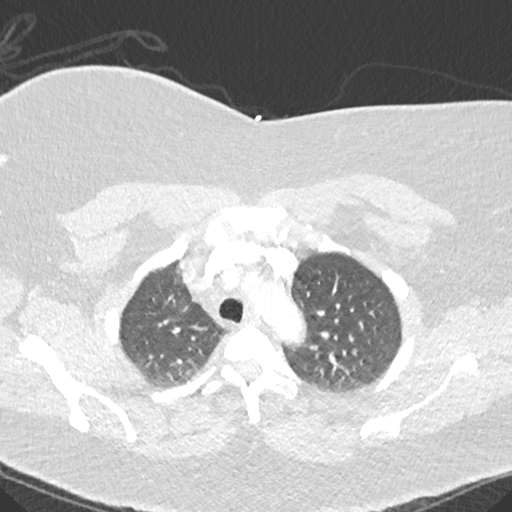
[im 228/271  mediastinal]
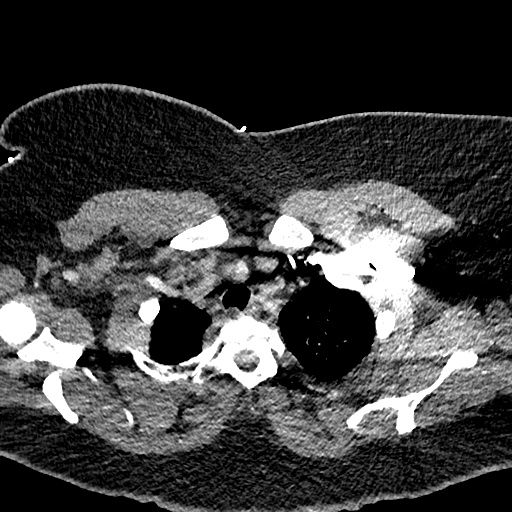
[im 242/271  lung]
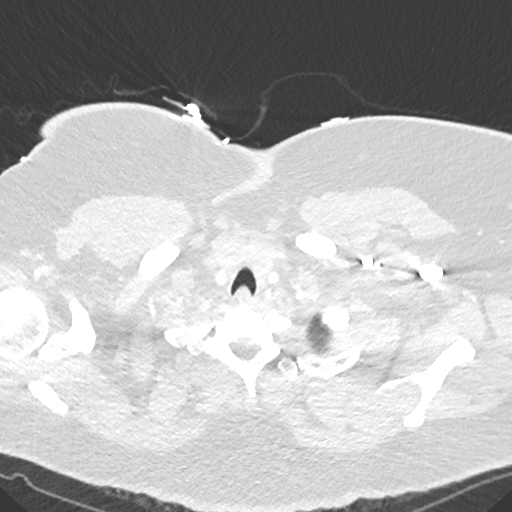
[im 256/271  mediastinal]
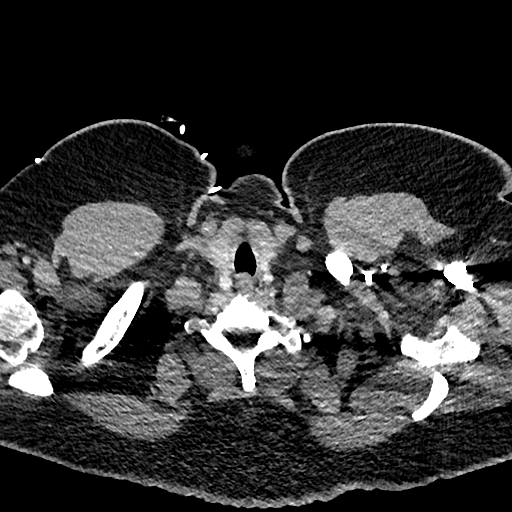

[Series 9: coronal mpr · coronal · 0.54mm/px · 1 of 132 slices shown]
[im 66/132  mediastinal]
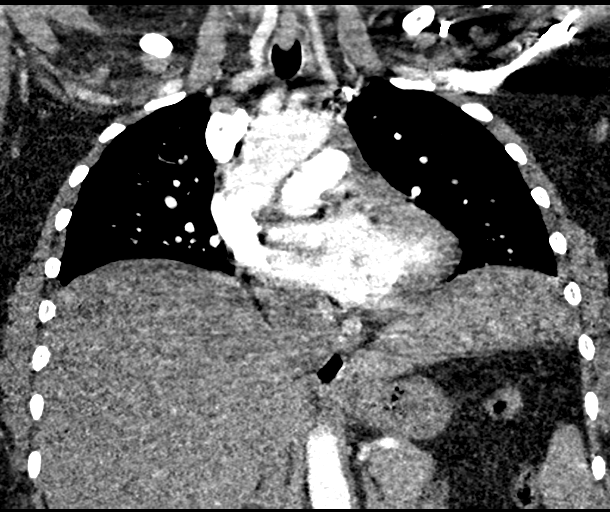

[19 of 36 positions shown; findings below may reference images not displayed]

FINDINGS: Cardiovascular: Satisfactory opacification of the pulmonary arteries
to the segmental level. Acute segmental pulmonary emboli are
identified in the lower lobes bilaterally (series 7, image 131 and
137). RV/ LV ratio equals 0.8.

Normal heart size.  No pericardial effusion.

Mediastinum/Nodes: No enlarged mediastinal, hilar, or axillary lymph
nodes. Thyroid gland, trachea, and esophagus demonstrate no
significant findings.

Lungs/Pleura: Small left pleural effusion and minor left lung base
dependent atelectasis. No evidence for pneumonia.

Upper Abdomen: No acute abnormality.

Musculoskeletal: No chest wall abnormality. No acute or significant
osseous findings.

Review of the MIP images confirms the above findings.
IMPRESSION: 1. Acute segmental pulmonary emboli in lower lobes bilaterally. No
CT evidence of right heart strain.
2. Small left pleural effusion.
3. No pulmonary consolidation.
These results were called by telephone at the time of interpretation
on 11/03/2016 at [DATE] to Dr. DARWUIN LIZAMA , who verbally
acknowledged these results.

By: Tiny Diep M.D.

## 2018-06-23 ENCOUNTER — Telehealth: Payer: Self-pay

## 2018-06-23 ENCOUNTER — Encounter: Payer: Self-pay | Admitting: Adult Health

## 2018-06-23 ENCOUNTER — Ambulatory Visit (INDEPENDENT_AMBULATORY_CARE_PROVIDER_SITE_OTHER): Payer: 59 | Admitting: Adult Health

## 2018-06-23 VITALS — BP 114/78 | HR 84 | Ht 70.0 in | Wt 322.8 lb

## 2018-06-23 DIAGNOSIS — I2699 Other pulmonary embolism without acute cor pulmonale: Secondary | ICD-10-CM

## 2018-06-23 DIAGNOSIS — J45909 Unspecified asthma, uncomplicated: Secondary | ICD-10-CM

## 2018-06-23 DIAGNOSIS — J309 Allergic rhinitis, unspecified: Secondary | ICD-10-CM

## 2018-06-23 DIAGNOSIS — R0602 Shortness of breath: Secondary | ICD-10-CM | POA: Diagnosis not present

## 2018-06-23 NOTE — Addendum Note (Signed)
Addended by: Annie Paras D on: 06/23/2018 09:43 AM   Modules accepted: Orders

## 2018-06-23 NOTE — Patient Instructions (Addendum)
Continue on Advair 1 puff Twice daily  ,rinse after use.  Continue on Singulair 10mg  At bedtime   Continue on Zyrtec 10mg  At bedtime   Continue on Flonase daily  Albuterol As needed  Wheezing  Follow up with Dr. Elsworth Soho in 4-6 months with PFT and As needed

## 2018-06-23 NOTE — Telephone Encounter (Signed)
Pt emailed that   Good morning. In reference to my recent fmla paperwork I needed it to also cover me for my absence from work on 05/08/18 as well. Is it possible for you to fax an update to [place] to include this date?  Cherina advised that pt has the orginial paperwork back from 06/07/2018 Our copy has been sent to scan weeks ago Advised pt if she needs anything changed or added on the [employer] forms to bring in paperwork to the office Nothing further needed.  Routing message to [place] for review.

## 2018-06-23 NOTE — Assessment & Plan Note (Signed)
Previous history of PE in 2018 which was unprovoked.  She is on lifelong anticoagulation with Eliquis.  Appears to be tolerating well.  Patient education given Continue current regimen

## 2018-06-23 NOTE — Assessment & Plan Note (Addendum)
Improved control on current regimen.  Patient education given. Patient will have full PFTs on return.  Plan  Patient Instructions  Continue on Advair 1 puff Twice daily  ,rinse after use.  Continue on Singulair 10mg  At bedtime   Continue on Zyrtec 10mg  At bedtime   Continue on Flonase daily  Albuterol As needed  Wheezing  Follow up with Dr. Elsworth Soho in 4-6 months with PFT and As needed

## 2018-06-23 NOTE — Assessment & Plan Note (Signed)
Well-controlled on Singulair Zyrtec and Flonase.  No changes

## 2018-06-23 NOTE — Progress Notes (Signed)
@Patient  ID: Holly Mcdonald, female    DOB: 01/08/1976, 43 y.o.   MRN: [id number]  Chief Complaint  Patient presents with  . Follow-up    Asthma     Referring provider: [place], Betty G, MD  HPI: 43 year old female never smoker followed for asthma and previous unprovoked bilateral PE (2018-treated with Eliquis,  Discontinued briefly due to retinal hemorrhages, Eliquis restarted 2019-lifelong )  Medical history significant for fibromyalgia, iron deficiency anemia, uterine fibroids and  Inflammatory Arthritis /early CTD -on Plaquenil and prednisone 10mg  daily   TEST/EVENTS :  6/2018CT angiogram of the chest showed acute segmental pulmonary emboli in bilateral lower lobes, no CT evidence of right heart strain. Doppler lower extremity was negative for DVT. Hypercoagulable panel negative  Spirometry 12/2016 >. FEV 1 90%   06/23/2018 Follow up : Asthma  Patient returns for a 6-week follow-up.  Patient was seen last visit for an asthma flare treated with a prednisone burst.  Patient says that she is feeling better.  She denies any flare of cough or wheezing. Remains on Advair Twice daily  , taking on regular basis. Rare use of albuterol .  She says she continues to get short of breath with activity.  This is been chronic in nature.  She denies any increased shortness of breath.  She works full-time tries to walk quite a bit at work.  Patient has a history of unprovoked PE in 2018.  She is on lifelong Eliquis.  Denies any known bleeding.  She has chronic rhinitis.  She remains on Singulair Flonase and Zyrtec.  Says currently symptoms are controlled.    She continues to follow with rheumatology for inflammatory arthritis and early connective tissue disease.  She is on Plaquenil and prednisone 10 mg daily.    Allergies  Allergen Reactions  . Other Nausea And Vomiting    Darvocet N-100  . Darvon [Propoxyphene]     Immunization History  Administered Date(s) Administered    . Influenza,inj,Quad PF,6+ Mos 01/25/2017, 02/15/2018  . Pneumococcal Polysaccharide-23 11/04/2016    Past Medical History:  Diagnosis Date  . Anticoagulated    eliquis  . Arthritis    inflammatory  . Bladder spasm   . Connective tissue disease ([place]) dx oct 2019  . Fibromyalgia   . GERD (gastroesophageal reflux disease)   . Heavy menstrual bleeding    resolved with megace  . History of chronic bronchitis    last flare up 11-18-17  . History of pulmonary embolism 10/2016--- followed by pcp and pulmoloigst   dx bilateral PE , bilateral lower lobes , segmental , unprovoked, negative hypercoagulable work-up-- treated w/ eliquis  . Iron deficiency anemia   . Migraine   . Mild intermittent asthma    pulmologist-  dr Elsworth Soho  . Morbid obesity with BMI of 45.0-49.9, adult ([place])   . Open-angle glaucoma of both eyes   . Pre-diabetes   . 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 Urgency of urination   . Uterine fibroid   . Vitamin D deficiency   . Wears glasses     Tobacco History: Social History   Tobacco Use  Smoking Status Never Smoker  Smokeless Tobacco Never Used   Counseling given: Not Answered   Outpatient Medications Prior to Visit  Medication Sig Dispense Refill  . albuterol (PROVENTIL HFA;VENTOLIN HFA) 108 (90 Base) MCG/ACT inhaler Inhale 1-2 puffs into the lungs every 6 (six) hours as needed for wheezing or shortness of breath. [place]  g 1  . albuterol (PROVENTIL) (2.5 MG/3ML) 0.083% nebulizer solution Take 3 mLs (2.5 mg total) by nebulization every 6 (six) hours as needed for wheezing or shortness of breath. 360 mL 5  . apixaban (ELIQUIS) 5 MG TABS tablet Take 1 tablet (5 mg total) by mouth 2 (two) times daily. 60 tablet 5  . baclofen (LIORESAL) 10 MG tablet TAKE 1 TABLET BY MOUTH THREE TIMES DAILY AS NEEDED FOR MUSCLE SPASMS 90 tablet 5  . benzonatate (TESSALON) 200 MG capsule Take 1 capsule (200 mg total) by mouth 3 (three) times daily as needed for  cough. 30 capsule 1  . cetirizine (ZYRTEC) 10 MG tablet Take 1 tablet (10 mg total) by mouth every morning. 30 tablet 5  . Cyanocobalamin (VITAMIN B 12 PO) Take 1,000 mg by mouth daily.     . DULoxetine (CYMBALTA) 30 MG capsule Take 1 capsule (30 mg total) by mouth daily. (Patient taking differently: Take 30 mg by mouth at bedtime. ) 30 capsule 2  . Ferrous Sulfate (IRON) 325 (65 Fe) MG TABS Take 3 tablets (975 mg total) by mouth every morning. 90 tablet 5  . fluticasone (FLONASE) 50 MCG/ACT nasal spray SHAKE LIQUID AND USE 1 SPRAY IN EACH NOSTRIL DAILY--  in am 16 g 2  . Fluticasone-Salmeterol (ADVAIR DISKUS) 250-50 MCG/DOSE AEPB Inhale 1 puff into the lungs 2 (two) times daily as needed. (Patient taking differently: Inhale 1 puff into the lungs 2 (two) times daily. ) 60 each 5  . hydroxychloroquine (PLAQUENIL) 200 MG tablet Take 200 mg by mouth daily.    . methocarbamol (ROBAXIN) 500 MG tablet Take 1 tablet (500 mg total) by mouth 2 (two) times daily as needed for muscle spasms. 60 tablet 1  . montelukast (SINGULAIR) 10 MG tablet Take 1 tablet (10 mg total) by mouth at bedtime. 30 tablet 6  . omeprazole (PRILOSEC) 40 MG capsule TAKE 1 CAPSULE(40 MG) BY MOUTH DAILY 30 capsule 5  . oxybutynin (DITROPAN) 5 MG tablet Take 1 tablet by mouth 2 (two) times daily.  11  . PREDNISONE PO Take 5 mg by mouth.     . pregabalin (LYRICA) 75 MG capsule TAKE 1 CAPSULE(75 MG) BY MOUTH THREE TIMES DAILY 90 capsule 1  . timolol (TIMOPTIC) 0.5 % ophthalmic solution Place 1 drop into both eyes every morning.    . topiramate (TOPAMAX) 25 MG tablet TK 3 TS PO D----- per pt takes 3 tablets qhs  1  . Vitamin D, Ergocalciferol, (DRISDOL) 50000 units CAPS capsule TAKE 1 CAPSULE BY MOUTH every 10 days. 10 capsule 1  . predniSONE (DELTASONE) 10 MG tablet Take 4 tabs po daily x 2 days; then 3 tabs for 2 days; then 2 tabs for 2 days; then 1 tab for 2 days 20 tablet 0   No facility-administered medications prior to visit.       Review of Systems:   Constitutional:   No  weight loss, night sweats,  Fevers, chills, + fatigue, or  lassitude.  HEENT:   No headaches,  Difficulty swallowing,  Tooth/dental problems, or  Sore throat,                No sneezing, itching, ear ache, nasal congestion, post nasal drip,   CV:  No chest pain,  Orthopnea, PND, swelling in lower extremities, anasarca, dizziness, palpitations, syncope.   GI  No heartburn, indigestion, abdominal pain, nausea, vomiting, diarrhea, change in bowel habits, loss of appetite, bloody stools.   Resp: No  chest wall deformity  Skin: no rash or lesions.  GU: no dysuria, change in color of urine, no urgency or frequency.  No flank pain, no hematuria   MS:  No joint pain or swelling.  No decreased range of motion.  No back pain.    Physical Exam  BP 114/78 (BP Location: Right Arm, Cuff Size: Normal)   Pulse 84   Ht 5\' 10"  (1.778 m)   Wt (!) 322 lb 12.8 oz (146.4 kg)   SpO2 99%   BMI 46.32 kg/m   GEN: A/Ox3; pleasant , NAD, morbidly obese    HEENT:  [place]/AT,  EACs-clear, TMs-wnl, NOSE-clear, THROAT-clear, no lesions, no postnasal drip or exudate noted.   NECK:  Supple w/ fair ROM; no JVD; normal carotid impulses w/o bruits; no thyromegaly or nodules palpated; no lymphadenopathy.    RESP  Clear  P & A; w/o, wheezes/ rales/ or rhonchi. no accessory muscle use, no dullness to percussion  CARD:  RRR, no m/r/g, no peripheral edema, pulses intact, no cyanosis or clubbing.  GI:   Soft & nt; nml bowel sounds; no organomegaly or masses detected.   Musco: Warm bil, no deformities or joint swelling noted.   Neuro: alert, no focal deficits noted.    Skin: Warm, no lesions or rashes    Lab Results:  CBC    Component Value Date/Time   WBC 8.7 05/08/2018 1604   RBC 4.62 05/08/2018 1604   HGB 13.1 05/08/2018 1604   HCT 39.6 05/08/2018 1604   PLT 281.0 05/08/2018 1604   MCV 85.7 05/08/2018 1604   MCH 27.7 04/26/2018 0850   MCHC 33.1  05/08/2018 1604   RDW 14.5 05/08/2018 1604   LYMPHSABS 3.8 05/08/2018 1604   MONOABS 0.5 05/08/2018 1604   EOSABS 0.0 05/08/2018 1604   BASOSABS 0.1 05/08/2018 1604    BMET    Component Value Date/Time   NA 139 05/08/2018 1604   K 4.2 05/08/2018 1604   CL 110 05/08/2018 1604   CO2 20 05/08/2018 1604   GLUCOSE 96 05/08/2018 1604   BUN 14 05/08/2018 1604   CREATININE 0.84 05/08/2018 1604   CALCIUM 9.6 05/08/2018 1604   GFRNONAA >60 04/26/2018 0850   GFRAA >60 04/26/2018 0850    BNP No results found for: BNP  ProBNP No results found for: PROBNP  Imaging: No results found.  ferumoxytol (FERAHEME) 510 mg in sodium chloride 0.9 % 100 mL IVPB    Date Action Dose Route User   05/13/2018 1031 Rate/Dose Verify (none) (none) Rennis Harding, RN   05/13/2018 1031 New Bag/Given 510 mg Intravenous Rennis Harding, RN    ferumoxytol [facility]) 510 mg in sodium chloride 0.9 % 100 mL IVPB    Date Action Dose Route User   05/24/2018 0906 Rate/Dose Verify (none) (none) Priscille Loveless, RN   05/24/2018 3716 New Bag/Given 510 mg Intravenous Priscille Loveless, RN    levalbuterol Penne Lash) nebulizer solution 0.63 mg    Date Action Dose Route User   05/08/2018 1616 Given 0.63 mg Nebulization Jannette Spanner, CMA    0.9 %  sodium chloride infusion    Date Action Dose Route User   05/13/2018 1110 Rate/Dose Verify (none) (none) Rennis Harding, RN   05/13/2018 1048 Rate/Dose Change (none) (none) Rennis Harding, RN   05/13/2018 1047 Rate/Dose Change (none) (none) Rennis Harding, RN   05/13/2018 1046 Restarted (none) (none) Rennis Harding, RN   05/13/2018 1017 Rate/Dose Verify (none) (none) Rennis Harding, RN  0.9 %  sodium chloride infusion    Date Action Dose Route User   05/24/2018 0948 Rate/Dose Verify (none) (none) Priscille Loveless, RN   05/24/2018 [phone] Rate/Dose Change (none) (none) Priscille Loveless, RN   05/24/2018 [phone] Rate/Dose Change (none) (none) Priscille Loveless,  RN   05/24/2018 [phone] Rate/Dose Verify (none) (none) Priscille Loveless, RN   05/24/2018 [phone] Restarted (none) (none) Priscille Loveless, RN      No flowsheet data found.  Lab Results  Component Value Date   NITRICOXIDE 25 05/08/2018        Assessment & Plan:   Asthma in adult, unspecified asthma severity, uncomplicated Improved control on current regimen.  Patient education given. Patient will have full PFTs on return.  Plan  Patient Instructions  Continue on Advair 1 puff Twice daily  ,rinse after use.  Continue on Singulair 10mg  At bedtime   Continue on Zyrtec 10mg  At bedtime   Continue on Flonase daily  Albuterol As needed  Wheezing  Follow up with Dr. Elsworth Soho in 4-6 months with PFT and As needed         Pulmonary embolus [facility]) Previous history of PE in 2018 which was unprovoked.  She is on lifelong anticoagulation with Eliquis.  Appears to be tolerating well.  Patient education given Continue current regimen  Allergic rhinitis Well-controlled on Singulair Zyrtec and Flonase.  No changes     Rexene Edison, NP 06/23/2018

## 2018-06-27 ENCOUNTER — Encounter (HOSPITAL_COMMUNITY): Payer: Self-pay | Admitting: *Deleted

## 2018-06-27 ENCOUNTER — Other Ambulatory Visit: Payer: Self-pay

## 2018-06-27 ENCOUNTER — Emergency Department (HOSPITAL_COMMUNITY)
Admission: EM | Admit: 2018-06-27 | Discharge: 2018-06-27 | Disposition: A | Discharge status: Home / Self Care | Payer: 59 | Attending: Emergency Medicine | Admitting: Emergency Medicine

## 2018-06-27 DIAGNOSIS — R0789 Other chest pain: Secondary | ICD-10-CM | POA: Diagnosis not present

## 2018-06-27 DIAGNOSIS — R6884 Jaw pain: Secondary | ICD-10-CM | POA: Diagnosis present

## 2018-06-27 DIAGNOSIS — Z79899 Other long term (current) drug therapy: Secondary | ICD-10-CM | POA: Insufficient documentation

## 2018-06-27 DIAGNOSIS — J45909 Unspecified asthma, uncomplicated: Secondary | ICD-10-CM | POA: Diagnosis not present

## 2018-06-27 HISTORY — DX: Other overlap syndromes: M35.1

## 2018-06-27 HISTORY — DX: Immunodeficiency, unspecified: D84.9

## 2018-06-27 LAB — BASIC METABOLIC PANEL
Anion gap: 10 (ref 5–15)
BUN: 15 mg/dL (ref 6–20)
CHLORIDE: 108 mmol/L (ref 98–111)
CO2: 21 mmol/L — ABNORMAL LOW (ref 22–32)
Calcium: 9.5 mg/dL (ref 8.9–10.3)
Creatinine, Ser: 0.84 mg/dL (ref 0.44–1.00)
GFR calc Af Amer: >60 mL/min (ref >60)
GFR calc non Af Amer: >60 mL/min (ref >60)
Glucose, Bld: 110 mg/dL — ABNORMAL HIGH (ref 70–99)
Potassium: 3.7 mmol/L (ref 3.5–5.1)
Sodium: 139 mmol/L (ref 135–145)

## 2018-06-27 LAB — CBC
HEMATOCRIT: 42.3 % (ref 36.0–46.0)
Hemoglobin: 13.7 g/dL (ref 12.0–15.0)
MCH: 28 pg (ref 26.0–34.0)
MCHC: 32.4 g/dL (ref 30.0–36.0)
MCV: 86.5 fL (ref 80.0–100.0)
Platelets: 219 10*3/uL (ref 150–400)
RBC: 4.89 MIL/uL (ref 3.87–5.11)
RDW: 14.6 % (ref 11.5–15.5)
WBC: 7.8 10*3/uL (ref 4.0–10.5)
nRBC: 0 % (ref 0.0–0.2)

## 2018-06-27 LAB — I-STAT TROPONIN, ED: Troponin i, poc: 0 ng/mL (ref 0.00–0.08)

## 2018-06-27 LAB — I-STAT BETA HCG BLOOD, ED (MC, WL, AP ONLY): I-stat hCG, quantitative: <5 m[IU]/mL (ref <5)

## 2018-06-27 MED ORDER — SODIUM CHLORIDE 0.9% FLUSH: 3.0000 mL | Freq: Once | INTRAVENOUS | Status: DC

## 2018-06-27 MED ORDER — KETOROLAC TROMETHAMINE 15 MG/ML IJ SOLN
15.0000 mg | Freq: Once | INTRAMUSCULAR | Status: AC
  Administered 2018-06-27: 15 mg via INTRAVENOUS
  Filled 2018-06-27: qty 1

## 2018-06-27 NOTE — ED Provider Notes (Signed)
[facility] EMERGENCY DEPARTMENT Provider Note   CSN: [id number] Arrival date & time: 06/27/18  0208    History   Chief Complaint Chief Complaint  Patient presents with  . Jaw Pain    HPI Holly Mcdonald is a 43 y.o. female.     23 yoF with a chief complaint of jaw pain.  This happened while the patient was watching TV.  She is unable to describe it but states that it hurts.  She called her doctor and due to her multiple medical problems they told her to come to the ED to be evaluated for heart attack.  She initially denied chest pain but states that she has been having chest pain off and on for weeks to months.  She thinks she had a couple episodes that lasted for seconds out in the waiting room.  She has trouble describing the chest pain as well.  Nothing seems to make it better or worse.  Denies shortness of breath denies exertional symptoms.  The history is provided by the patient.  Illness  Severity:  Mild Onset quality:  Sudden Duration:  6 hours Timing:  Constant Progression:  Unchanged Chronicity:  New Associated symptoms: chest pain   Associated symptoms: no congestion, no fever, no headaches, no myalgias, no nausea, no rhinorrhea, no shortness of breath, no vomiting and no wheezing     Past Medical History:  Diagnosis Date  . Anticoagulated    eliquis  . Arthritis    inflammatory  . Bladder spasm   . Connective tissue disease ([place]) dx oct 2019  . Fibromyalgia   . GERD (gastroesophageal reflux disease)   . Heavy menstrual bleeding    resolved with megace  . History of chronic bronchitis    last flare up 11-18-17  . History of pulmonary embolism 10/2016--- followed by pcp and pulmoloigst   dx bilateral PE , bilateral lower lobes , segmental , unprovoked, negative hypercoagulable work-up-- treated w/ eliquis  . Immune deficiency disorder ([place])   . Iron deficiency anemia   . Migraine   . Mild intermittent asthma    pulmologist-  dr  Elsworth Soho  . Mixed connective tissue disease ([place])   . Morbid obesity with BMI of 45.0-49.9, adult ([place])   . Open-angle glaucoma of both eyes   . Pre-diabetes   . 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 Urgency of urination   . Uterine fibroid   . Vitamin D deficiency   . Wears glasses     Patient Active Problem List   Diagnosis Date Noted  . B12 deficiency 02/15/2018  . Chronic pain syndrome 12/16/2017  . Incontinence of urine in female 09/16/2017  . DUB (dysfunctional uterine bleeding) 08/03/2017  . Polyarthralgia 07/06/2017  . Personal history of venous thrombosis and embolism 04/06/2017  . Allergic rhinitis 03/07/2017  . Hyperlipidemia 02/24/2017  . Pre-diabetes 02/24/2017  . Anxiety disorder 01/24/2017  . Fibromyalgia muscle pain 01/24/2017  . Shortness of breath 01/13/2017  . Costochondritis 01/13/2017  . Family history of premature CAD 01/13/2017  . GERD (gastroesophageal reflux disease) 12/21/2016  . Vitamin D deficiency, unspecified 12/21/2016  . Iron deficiency anemia due to chronic blood loss 12/20/2016  . Asthma in adult, unspecified asthma severity, uncomplicated 53/29/9242  . Obesity, Class III, BMI 40-49.9 (morbid obesity) ([place]) 11/04/2016  . Pulmonary embolus ([place])   . Bilateral pulmonary embolism ([place]) 11/03/2016    Past Surgical History:  Procedure Laterality Date  . ANAL EXAMINATION  UNDER ANESTHESIA  age 28   w/ removal cyst  . DILATATION & CURETTAGE/HYSTEROSCOPY WITH MYOSURE N/A 10/17/2017   Procedure: [place];  Surgeon: Anastasio Auerbach, MD;  Location: [facility];  Service: Gynecology;  Laterality: N/A;  . DILATATION & CURETTAGE/HYSTEROSCOPY WITH MYOSURE N/A 04/11/2018   Procedure: DILATATION & CURETTAGE/HYSTEROSCOPY WITH MYOSURE;  Surgeon: Anastasio Auerbach, MD;  Location: [facility];  Service: Gynecology;  Laterality: N/A;  . GYNECOLOGIC CRYOSURGERY  age  38s  . INTRAUTERINE DEVICE INSERTION  04/18/2018  . TONSILLECTOMY  age 35  . TRANSTHORACIC ECHOCARDIOGRAM  01/17/2017   ef 60-65%/  mild TR     OB History    Gravida  0   Para  0   Term  0   Preterm  0   AB  0   Living  0     SAB  0   TAB  0   Ectopic  0   Multiple  0   Live Births  0            Home Medications    Prior to Admission medications   Medication Sig Start Date End Date Taking? Authorizing Provider  albuterol (PROVENTIL HFA;VENTOLIN HFA) 108 (90 Base) MCG/ACT inhaler Inhale 1-2 puffs into the lungs every 6 (six) hours as needed for wheezing or shortness of breath. 04/21/18   Kennith Gain, MD  albuterol (PROVENTIL) (2.5 MG/3ML) 0.083% nebulizer solution Take 3 mLs (2.5 mg total) by nebulization every 6 (six) hours as needed for wheezing or shortness of breath. 12/21/16   Rigoberto Noel, MD  apixaban (ELIQUIS) 5 MG TABS tablet Take 1 tablet (5 mg total) by mouth 2 (two) times daily. 04/26/18   Truitt Merle, MD  baclofen (LIORESAL) 10 MG tablet TAKE 1 TABLET BY MOUTH THREE TIMES DAILY AS NEEDED FOR MUSCLE SPASMS 06/20/18   Jamse Arn, MD  benzonatate (TESSALON) 200 MG capsule Take 1 capsule (200 mg total) by mouth 3 (three) times daily as needed for cough. 11/18/17   Mannam, Hart Robinsons, MD  cetirizine (ZYRTEC) 10 MG tablet Take 1 tablet (10 mg total) by mouth every morning. 04/21/18   Kennith Gain, MD  Cyanocobalamin (VITAMIN B 12 PO) Take 1,000 mg by mouth daily.     [provider]  DULoxetine (CYMBALTA) 30 MG capsule Take 1 capsule (30 mg total) by mouth daily. Patient taking differently: Take 30 mg by mouth at bedtime.  02/15/18   Martinique, Betty G, MD  Ferrous Sulfate (IRON) 325 (65 Fe) MG TABS Take 3 tablets (975 mg total) by mouth every morning. 04/26/18   Truitt Merle, MD  fluticasone (FLONASE) 50 MCG/ACT nasal spray SHAKE LIQUID AND USE 1 SPRAY IN EACH NOSTRIL DAILY--  in am 04/21/18   Kennith Gain, MD   Fluticasone-Salmeterol (ADVAIR DISKUS) 250-50 MCG/DOSE AEPB Inhale 1 puff into the lungs 2 (two) times daily as needed. Patient taking differently: Inhale 1 puff into the lungs 2 (two) times daily.  04/21/18   Kennith Gain, MD  hydroxychloroquine (PLAQUENIL) 200 MG tablet Take 200 mg by mouth daily.    [provider]  methocarbamol (ROBAXIN) 500 MG tablet Take 1 tablet (500 mg total) by mouth 2 (two) times daily as needed for muscle spasms. 02/08/18   Jamse Arn, MD  montelukast (SINGULAIR) 10 MG tablet Take 1 tablet (10 mg total) by mouth at bedtime. 04/21/18   Kennith Gain, MD  omeprazole (  PRILOSEC) 40 MG capsule TAKE 1 CAPSULE(40 MG) BY MOUTH DAILY 05/22/18   Martinique, Betty G, MD  oxybutynin (DITROPAN) 5 MG tablet Take 1 tablet by mouth 2 (two) times daily. 12/28/16   [provider]  PREDNISONE PO Take 5 mg by mouth.     [provider]  pregabalin (LYRICA) 75 MG capsule TAKE 1 CAPSULE(75 MG) BY MOUTH THREE TIMES DAILY 05/22/18   Jamse Arn, MD  timolol (TIMOPTIC) 0.5 % ophthalmic solution Place 1 drop into both eyes every morning.    [provider]  topiramate (TOPAMAX) 25 MG tablet TK 3 TS PO D----- per pt takes 3 tablets qhs 05/26/17   [provider]  Vitamin D, Ergocalciferol, (DRISDOL) 50000 units CAPS capsule TAKE 1 CAPSULE BY MOUTH every 10 days. 02/19/18   Martinique, Betty G, MD    Family History Family History  Problem Relation Age of Onset  . Pulmonary embolism Mother        Last year  . Mental retardation Mother        PTSD and fibromyalgia  . Heart disease Mother        CHF  . Congestive Heart Failure Mother   . Heart disease Father 36       CAD  . Hyperlipidemia Father   . Diabetes Paternal Grandmother   . Heart attack Paternal Grandmother   . Congestive Heart Failure Maternal Aunt   . Cancer Neg Hx     Social History Social History   Tobacco Use  . Smoking status: Never Smoker  .  Smokeless tobacco: Never Used  Substance Use Topics  . Alcohol use: Yes    Comment: occasionally  . Drug use: No     Allergies   Other and Darvon [propoxyphene]   Review of Systems Review of Systems  Constitutional: Negative for chills and fever.  HENT: Negative for congestion and rhinorrhea.        Jaw pain   Eyes: Negative for redness and visual disturbance.  Respiratory: Negative for shortness of breath and wheezing.   Cardiovascular: Positive for chest pain. Negative for palpitations.  Gastrointestinal: Negative for nausea and vomiting.  Genitourinary: Negative for dysuria and urgency.  Musculoskeletal: Negative for arthralgias and myalgias.  Skin: Negative for pallor and wound.  Neurological: Negative for dizziness and headaches.     Physical Exam Updated Vital Signs BP (!) 135/92 (BP Location: Right Arm)   Pulse 90   Temp (!) 97.5 F (36.4 C) (Oral)   Resp 16   Ht 5\' 10"  (1.778 m)   Wt (!) 145.2 kg   SpO2 98%   BMI 45.92 kg/m   Physical Exam Vitals signs and nursing note reviewed.  Constitutional:      General: She is not in acute distress.    Appearance: She is well-developed. She is obese. She is not diaphoretic.  HENT:     Head: Normocephalic and atraumatic.     Comments: Noted pain to the TMJ, pain to the masseter bilaterally.  Worse at the attachment to the lower jaw.  No intraoral pain no pain with percussion to the posterior molars.  No sublingual swelling.  Tolerating secretions without difficulty. Eyes:     Pupils: Pupils are equal, round, and reactive to light.  Neck:     Musculoskeletal: Normal range of motion and neck supple.  Cardiovascular:     Rate and Rhythm: Normal rate and regular rhythm.     Heart sounds: No murmur. No friction rub.  No gallop.   Pulmonary:     Effort: Pulmonary effort is normal.     Breath sounds: No wheezing or rales.     Comments: Tenderness about the left anterior chest wall reproduces the patient's pain. Chest:      Chest wall: Tenderness present.  Abdominal:     General: There is no distension.     Palpations: Abdomen is soft.     Tenderness: There is no abdominal tenderness.  Musculoskeletal:        General: No tenderness.  Skin:    General: Skin is warm and dry.  Neurological:     Mental Status: She is alert and oriented to person, place, and time.  Psychiatric:        Behavior: Behavior normal.      ED Treatments / Results  Labs (all labs ordered are listed, but only abnormal results are displayed) Labs Reviewed  BASIC METABOLIC PANEL - Abnormal; Notable for the following components:      Result Value   CO2 21 (*)    Glucose, Bld 110 (*)    All other components within normal limits  CBC  I-STAT TROPONIN, ED  I-STAT BETA HCG BLOOD, ED ([facility], [facility], [facility] ONLY)    EKG EKG Interpretation  Date/Time:  Tuesday June 27 2018 02:16:25 EST Ventricular Rate:  90 PR Interval:  160 QRS Duration: 80 QT Interval:  362 QTC Calculation: 442 R Axis:   -35 Text Interpretation:  Normal sinus rhythm Left axis deviation Low voltage QRS Inferior infarct , age undetermined Possible Anterolateral infarct , age undetermined Abnormal ECG No significant change since last tracing Confirmed by Deno Etienne [phone]) on 06/27/2018 7:07:44 AM   Radiology No results found.  Procedures Procedures (including critical care time)  Medications Ordered in ED Medications  sodium chloride flush (NS) 0.9 % injection 3 mL (has no administration in time range)  ketorolac (TORADOL) 15 MG/ML injection 15 mg (has no administration in time range)     Initial Impression / Assessment and Plan / ED Course  I have reviewed the triage vital signs and the nursing notes.  Pertinent labs & imaging results that were available during my care of the patient were reviewed by me and considered in my medical decision making (see chart for details).        43 yo F with a chief complaint of jaw pain.  Pain about bilateral  masseters, likely muscular strain.  Patient also has pain on her chest wall that reproduces the chest pain that she has had off and on.  We will have her do a short course of Tylenol and ibuprofen.  We will have her follow-up with her family doctor.  She had a work-up done in triage to evaluate for ACS her EKG is unchanged her troponin is negative her BNP without significant electrolyte abnormality.  7:35 AM:  I have discussed the diagnosis/risks/treatment options with the patient and family and believe the pt to be eligible for discharge home to follow-up with PCP. We also discussed returning to the ED immediately if new or worsening sx occur. We discussed the sx which are most concerning (e.g., sudden worsening pain, fever, inability to tolerate by mouth) that necessitate immediate return. Medications administered to the patient during their visit and any new prescriptions provided to the patient are listed below.  Medications given during this visit Medications  sodium chloride flush (NS) 0.9 % injection 3 mL (has no administration in time range)  ketorolac (TORADOL) 15  MG/ML injection 15 mg (has no administration in time range)     The patient appears reasonably screen and/or stabilized for discharge and I doubt any other medical condition or other [facility] requiring further screening, evaluation, or treatment in the ED at this time prior to discharge.    Final Clinical Impressions(s) / ED Diagnoses   Final diagnoses:  Jaw pain, non-TMJ  Atypical chest pain    ED Discharge Orders    None       Deno Etienne, DO 06/27/18 [phone]

## 2018-06-27 NOTE — Discharge Instructions (Addendum)
Take 4 over the counter ibuprofen tablets 3 times a day or 2 over-the-counter naproxen tablets twice a day for pain. Also take tylenol 1000mg (2 extra strength) four times a day.    Soft diet for the next few days. Talk with your dentist about a mouth guard if you grind your teeth.

## 2018-06-27 NOTE — ED Triage Notes (Signed)
Pt had a sudden onset of jaw pain while watching TV tonight. Denies CP or SOB. Significant medical hx and takes eliquis. Also reports she grinds her teeth at night and hasn't been wearing her teeth guard.

## 2018-07-05 ENCOUNTER — Ambulatory Visit: Payer: 59 | Admitting: Pulmonary Disease

## 2018-07-10 ENCOUNTER — Telehealth: Payer: Self-pay | Admitting: *Deleted

## 2018-07-10 MED ORDER — MEGESTROL ACETATE 20 MG PO TABS: ORAL_TABLET | ORAL | 0 refills | Status: DC

## 2018-07-10 NOTE — Telephone Encounter (Signed)
I would start on Megace 20 mg twice daily for several days then daily for a week or so to suppress bleeding #30

## 2018-07-10 NOTE — Telephone Encounter (Signed)
Left detailed message on cell, Rx sent to pharmacy

## 2018-07-10 NOTE — Telephone Encounter (Signed)
Patient called to follow up, had Mirena IUD placed on Dec. 2019, had spotting discussed this at 1 month follow up visit. This am actually flow started, she is concerned about fibroids and her IUD coming out, this has happened in past. She is aware if bleeding becomes heavy to schedule visit, she wanted to know if you suggest just monitoring the flow for now? If bleeding x1 week schedule visit regardless of flow? Please advise

## 2018-07-15 IMAGING — CT CT ANGIO CHEST
2 of 6 series · 18 of 36 positions shown · IV contrast (Omni 300)
Comparison: 11/03/2016 chest CT

CLINICAL DATA: Pulmonary embolus last month with worsening dyspnea
and chest pain today.

EXAM:
CT ANGIOGRAPHY CHEST WITH CONTRAST
TECHNIQUE: Multidetector CT imaging of the chest was performed using the
standard protocol during bolus administration of intravenous
contrast. Multiplanar CT image reconstructions and MIPs were
obtained to evaluate the vascular anatomy.
CONTRAST:  100 cc Isovue 370 IV

[Series 6: pe thins · axial · 0.71mm/px · z∈[+1247,+1463]mm · 17 of 341 slices shown]
[im 16/341  lung]
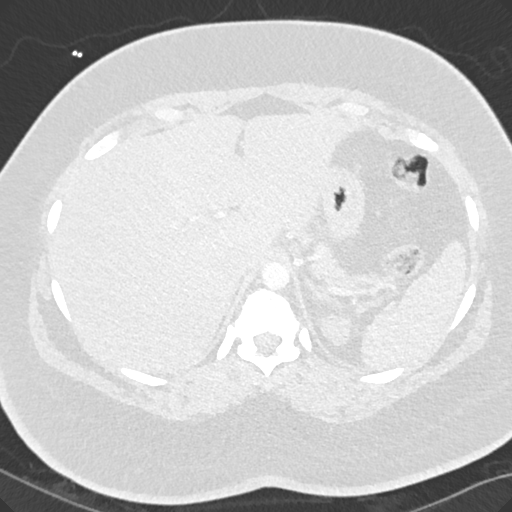
[im 31/341  mediastinal]
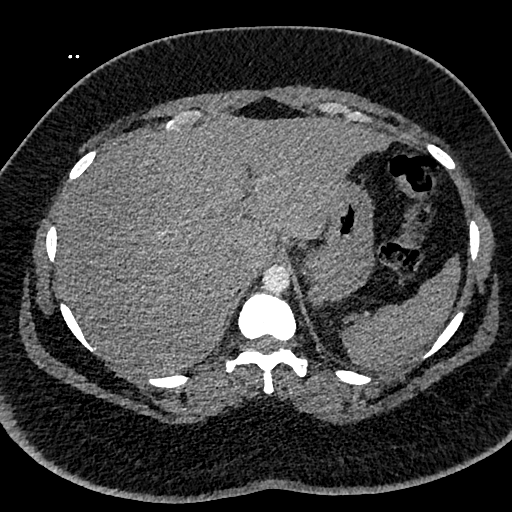
[im 62/341  lung]
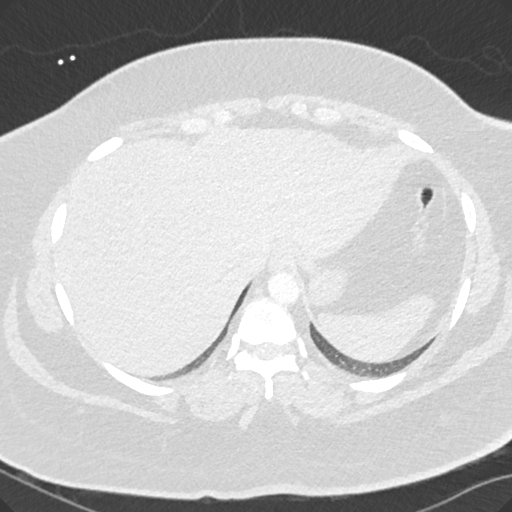
[im 78/341  mediastinal]
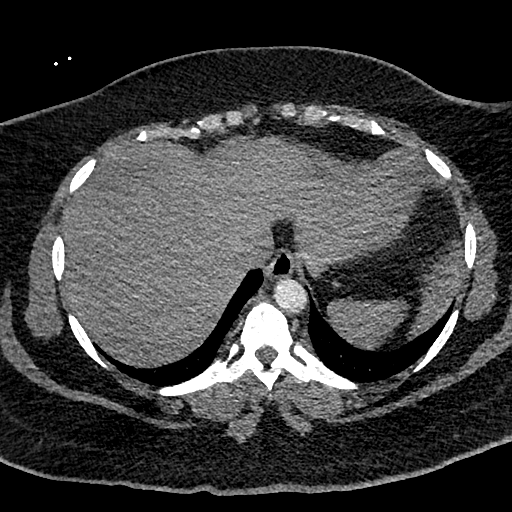
[im 93/341  lung]
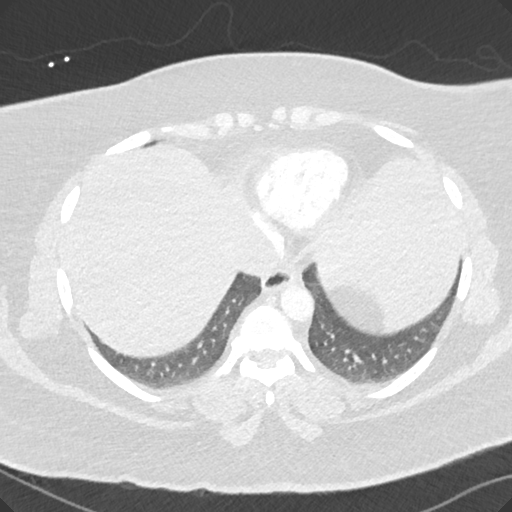
[im 109/341  mediastinal]
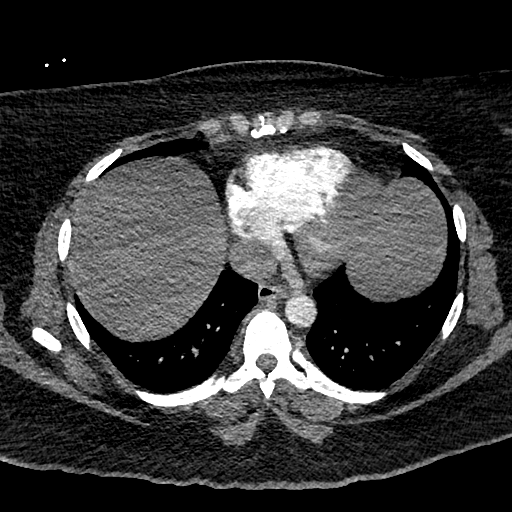
[im 140/341  lung]
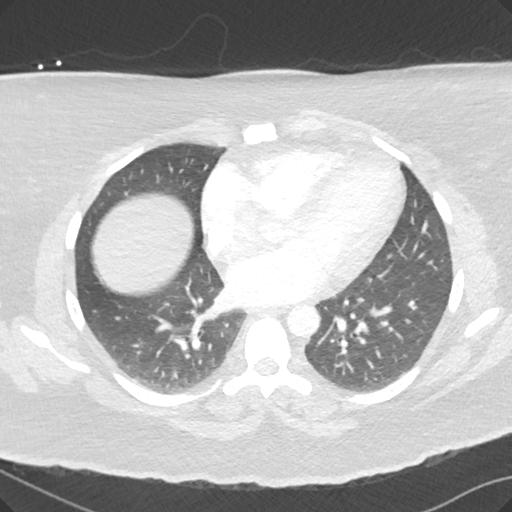
[im 155/341  mediastinal]
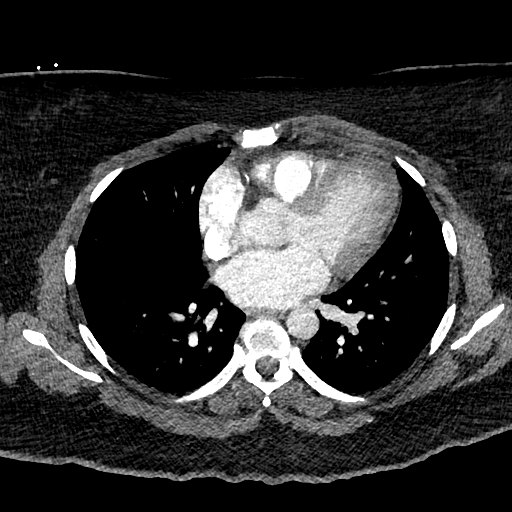
[im 171/341  lung]
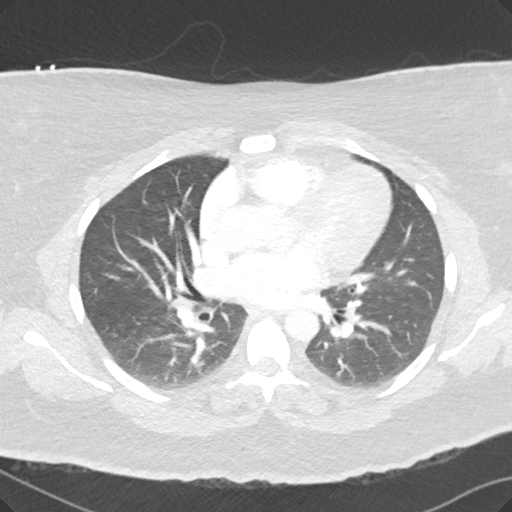
[im 186/341  mediastinal]
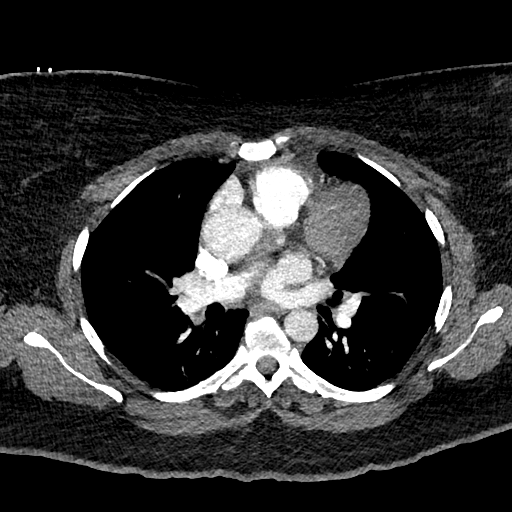
[im 201/341  lung]
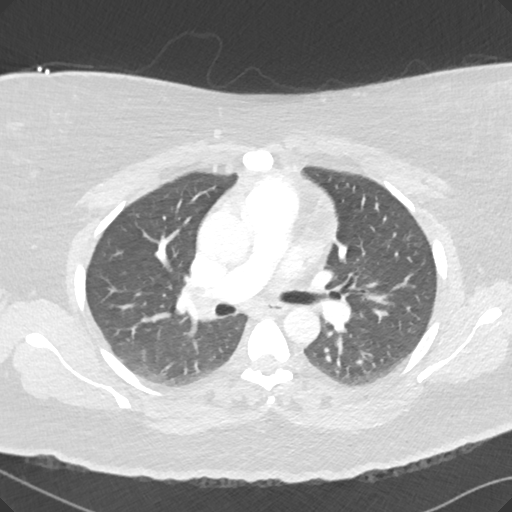
[im 232/341  mediastinal]
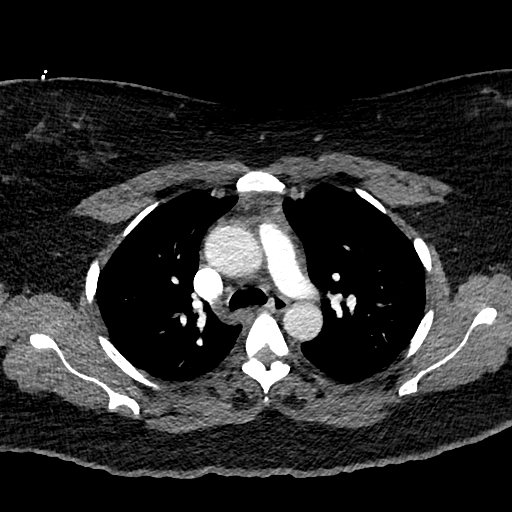
[im 248/341  lung]
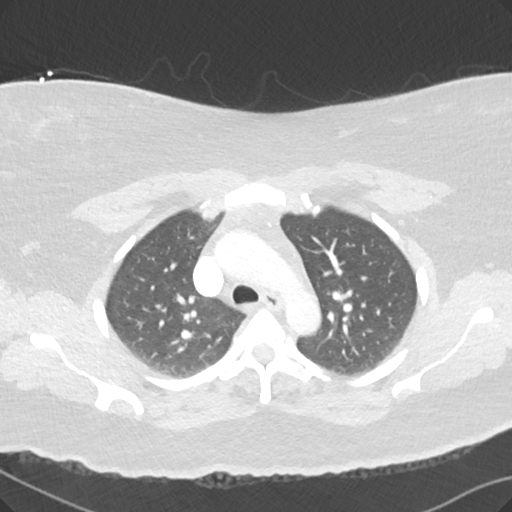
[im 263/341  mediastinal]
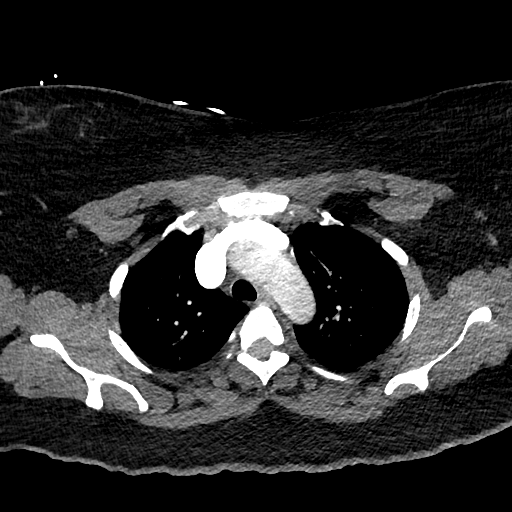
[im 279/341  lung]
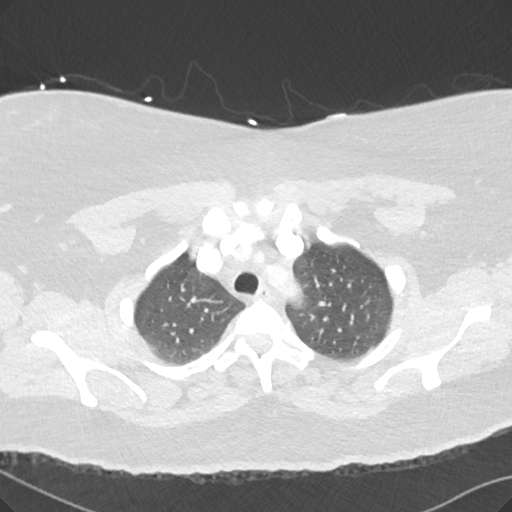
[im 310/341  mediastinal]
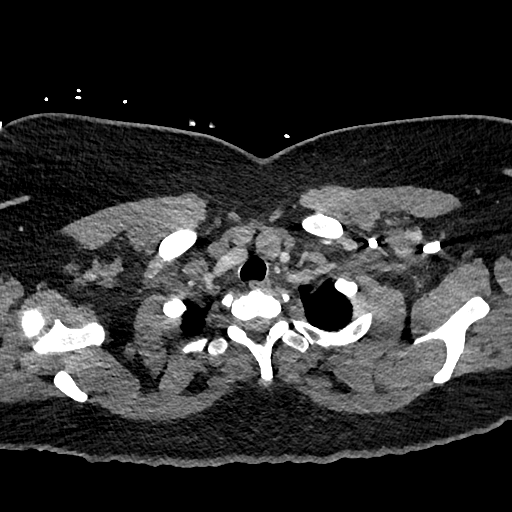
[im 325/341  lung]
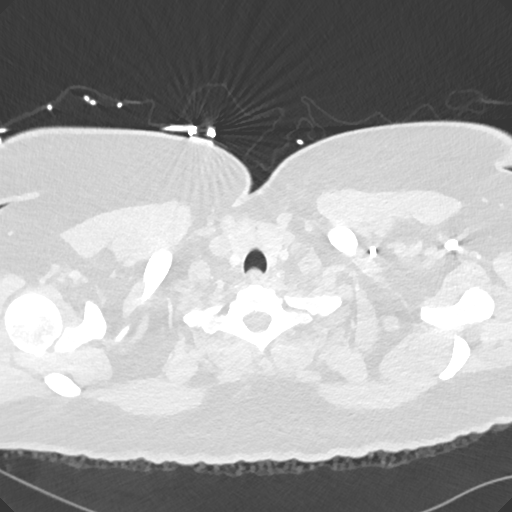

[Series 7: pe 2mm cor · coronal · 0.59mm/px · 1 of 90 slices shown]
[im 45/90  mediastinal]
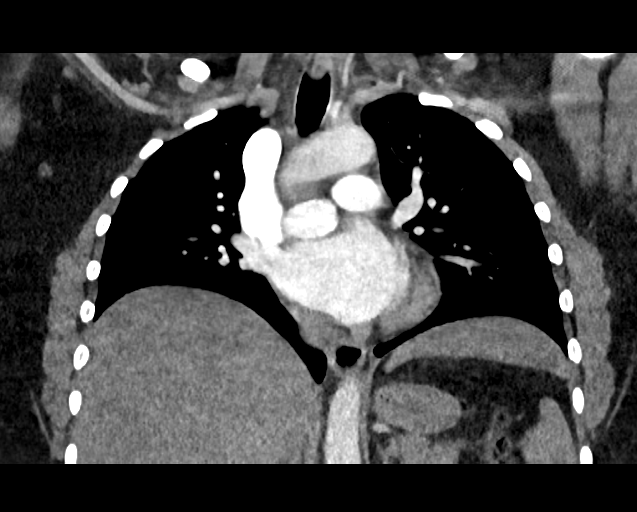

[18 of 36 positions shown; findings below may reference images not displayed]

FINDINGS: Cardiovascular: Heart is normal in size without pericardial
effusion. There are tiny subsegmental pulmonary emboli to both lower
lobes though less so than on prior exam. Findings may represent some
residual pulmonary emboli from previous implant. No new appearing
pulmonary embolus is identified. No heart strain.

Mediastinum/Nodes: No enlarged mediastinal, hilar, or axillary lymph
nodes. Thyroid gland, trachea, and esophagus demonstrate no
significant findings.

Lungs/Pleura: Clearing of left effusion. No pneumothorax or
pneumonic consolidation

Upper Abdomen: No acute abnormality.

Musculoskeletal: No chest wall abnormality. No acute or significant
osseous findings.

Review of the MIP images confirms the above findings.
IMPRESSION: 1. Clearing of left pleural effusion with some residual tiny
subsegmental pulmonary emboli are noted to both lower lobes, overall
improved since previous exam suggesting some interval lysed cysts of
pre-existing thrombi. Recanalization may be a source of chest pain.
Critical Value/emergent results were called by telephone at the time
of interpretation on 11/26/2016 at [DATE] to Dr. ABIMELK TIGER ,
who verbally acknowledged these results.
2. No acute pulmonary disease.

## 2018-07-26 ENCOUNTER — Inpatient Hospital Stay: Payer: 59 | Attending: Hematology

## 2018-07-26 ENCOUNTER — Other Ambulatory Visit: Payer: Self-pay

## 2018-07-26 DIAGNOSIS — D500 Iron deficiency anemia secondary to blood loss (chronic): Secondary | ICD-10-CM | POA: Insufficient documentation

## 2018-07-26 DIAGNOSIS — Z86718 Personal history of other venous thrombosis and embolism: Secondary | ICD-10-CM

## 2018-07-26 LAB — CBC WITH DIFFERENTIAL/PLATELET
ABS IMMATURE GRANULOCYTES: 0.02 10*3/uL (ref 0.00–0.07)
Basophils Absolute: 0 10*3/uL (ref 0.0–0.1)
Basophils Relative: 1 %
Eosinophils Absolute: 0 10*3/uL (ref 0.0–0.5)
Eosinophils Relative: 1 %
HCT: 41.4 % (ref 36.0–46.0)
Hemoglobin: 13.5 g/dL (ref 12.0–15.0)
Immature Granulocytes: 0 %
LYMPHS ABS: 2 10*3/uL (ref 0.7–4.0)
Lymphocytes Relative: 30 %
MCH: 28.8 pg (ref 26.0–34.0)
MCHC: 32.6 g/dL (ref 30.0–36.0)
MCV: 88.5 fL (ref 80.0–100.0)
Monocytes Absolute: 0.5 10*3/uL (ref 0.1–1.0)
Monocytes Relative: 8 %
NEUTROS ABS: 4 10*3/uL (ref 1.7–7.7)
Neutrophils Relative %: 60 %
Platelets: 228 10*3/uL (ref 150–400)
RBC: 4.68 MIL/uL (ref 3.87–5.11)
RDW: 15.1 % (ref 11.5–15.5)
WBC: 6.6 10*3/uL (ref 4.0–10.5)
nRBC: 0 % (ref 0.0–0.2)

## 2018-07-26 LAB — FERRITIN: Ferritin: 87 ng/mL (ref 11–307)

## 2018-07-26 LAB — IRON AND TIBC
Iron: 65 ug/dL (ref 41–142)
Saturation Ratios: 23 % (ref 21–57)
TIBC: 286 ug/dL (ref 236–444)
UIBC: 221 ug/dL (ref 120–384)

## 2018-07-28 ENCOUNTER — Telehealth: Payer: Self-pay

## 2018-07-28 NOTE — Telephone Encounter (Signed)
Spoke with patient regarding lab results, per Dr. Burr Medico no anemia, iron level WNL, no need for IV iron for now, no concerns, patient states that she still feels tired, instructed her to increase her liquid intake, evidently with her current job she self limits her fluids because they are only allowed to go to the bathroom on their break.

## 2018-07-28 NOTE — Telephone Encounter (Signed)
-----   Message from Truitt Merle, MD sent at 07/27/2018  5:40 PM EDT ----- Please let pt know her lab results, no anemia, iron level WNL, no concerns, no need iv iron for now, thanks   Truitt Merle  07/27/2018

## 2018-08-03 ENCOUNTER — Encounter: Payer: Self-pay | Admitting: Gynecology

## 2018-08-03 ENCOUNTER — Ambulatory Visit (INDEPENDENT_AMBULATORY_CARE_PROVIDER_SITE_OTHER): Payer: 59 | Admitting: Gynecology

## 2018-08-03 ENCOUNTER — Other Ambulatory Visit: Payer: Self-pay

## 2018-08-03 VITALS — BP 124/82

## 2018-08-03 DIAGNOSIS — N939 Abnormal uterine and vaginal bleeding, unspecified: Secondary | ICD-10-CM

## 2018-08-03 DIAGNOSIS — Z30431 Encounter for routine checking of intrauterine contraceptive device: Secondary | ICD-10-CM | POA: Diagnosis not present

## 2018-08-03 NOTE — Telephone Encounter (Signed)
Can you schedule with TF

## 2018-08-03 NOTE — Patient Instructions (Signed)
Follow-up if bleeding continues to be an issue.

## 2018-08-03 NOTE — Progress Notes (Signed)
    Holly Mcdonald 1975/12/08 [id number]        43 y.o.  G0P0000 presents with episode of heavy bleeding.  She has a Mirena IUD in place and she was afraid that she had expelled it as she has spontaneously expelled an IUD in the past.  Her most recent Mirena IUD was placed 04/2018.  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  Most recent hemoglobin 07/2018 was 13.5.  Has tried a number of treatments for her menorrhagia/leiomyoma to include hysteroscopic resection submucous myomas x2, Depo-Lupron, Aygestin, Megace and her Mirena IUDs.  Was seen at [facility] and consideration for robotic hysterectomy but given all of her issues it was felt at that time the risks outweighed the benefits.  Past medical history,surgical history, problem list, medications, allergies, family history and social history were all reviewed and documented in the EPIC chart.  Directed ROS with pertinent positives and negatives documented in the history of present illness/assessment and plan.  Exam: [name] assistant Vitals:   08/03/18 1039  BP: 124/82   General appearance:  Normal Abdomen obese without gross masses or tenderness Pelvic external BUS vagina normal with scant bleeding.  Cervix normal with IUD string visualized.  Bimanual without gross masses or tenderness  Assessment/Plan:  43 y.o. G0P0000 with history as above.  IUD string visualized.  Does not appear she is extruding her IUD.  We did discuss that I cannot tell if it is not low in the uterus.  Options for ultrasound discussed.  I would not intervene at this point if the IUD was low in the uterus as she is not using it for contraception but for menstrual suppression and would hopefully be effective if it is going to be by being in the uterus regardless of position.  She uses Megace now intermittently if she has a heavier  bleeding episode which controls her bleeding.  Will continue as is for now.  She did discuss that her ophthalmologist felt hysterectomy would be okay from an eye safety standpoint and I discussed with her that if she is considering hysterectomy she should return to [facility] as I think given all of her conditions a [facility] would be a better situation if she was to proceed with hysterectomy.   Anastasio Auerbach MD, 11:06 AM 08/03/2018

## 2018-08-04 IMAGING — CR DG CHEST 2V
2 series · 2 of 2 positions shown · non-contrast
Comparison: 11/25/2016

CLINICAL DATA: Shortness of breath and chest pain.

EXAM:
CHEST  2 VIEW

[chest pa]
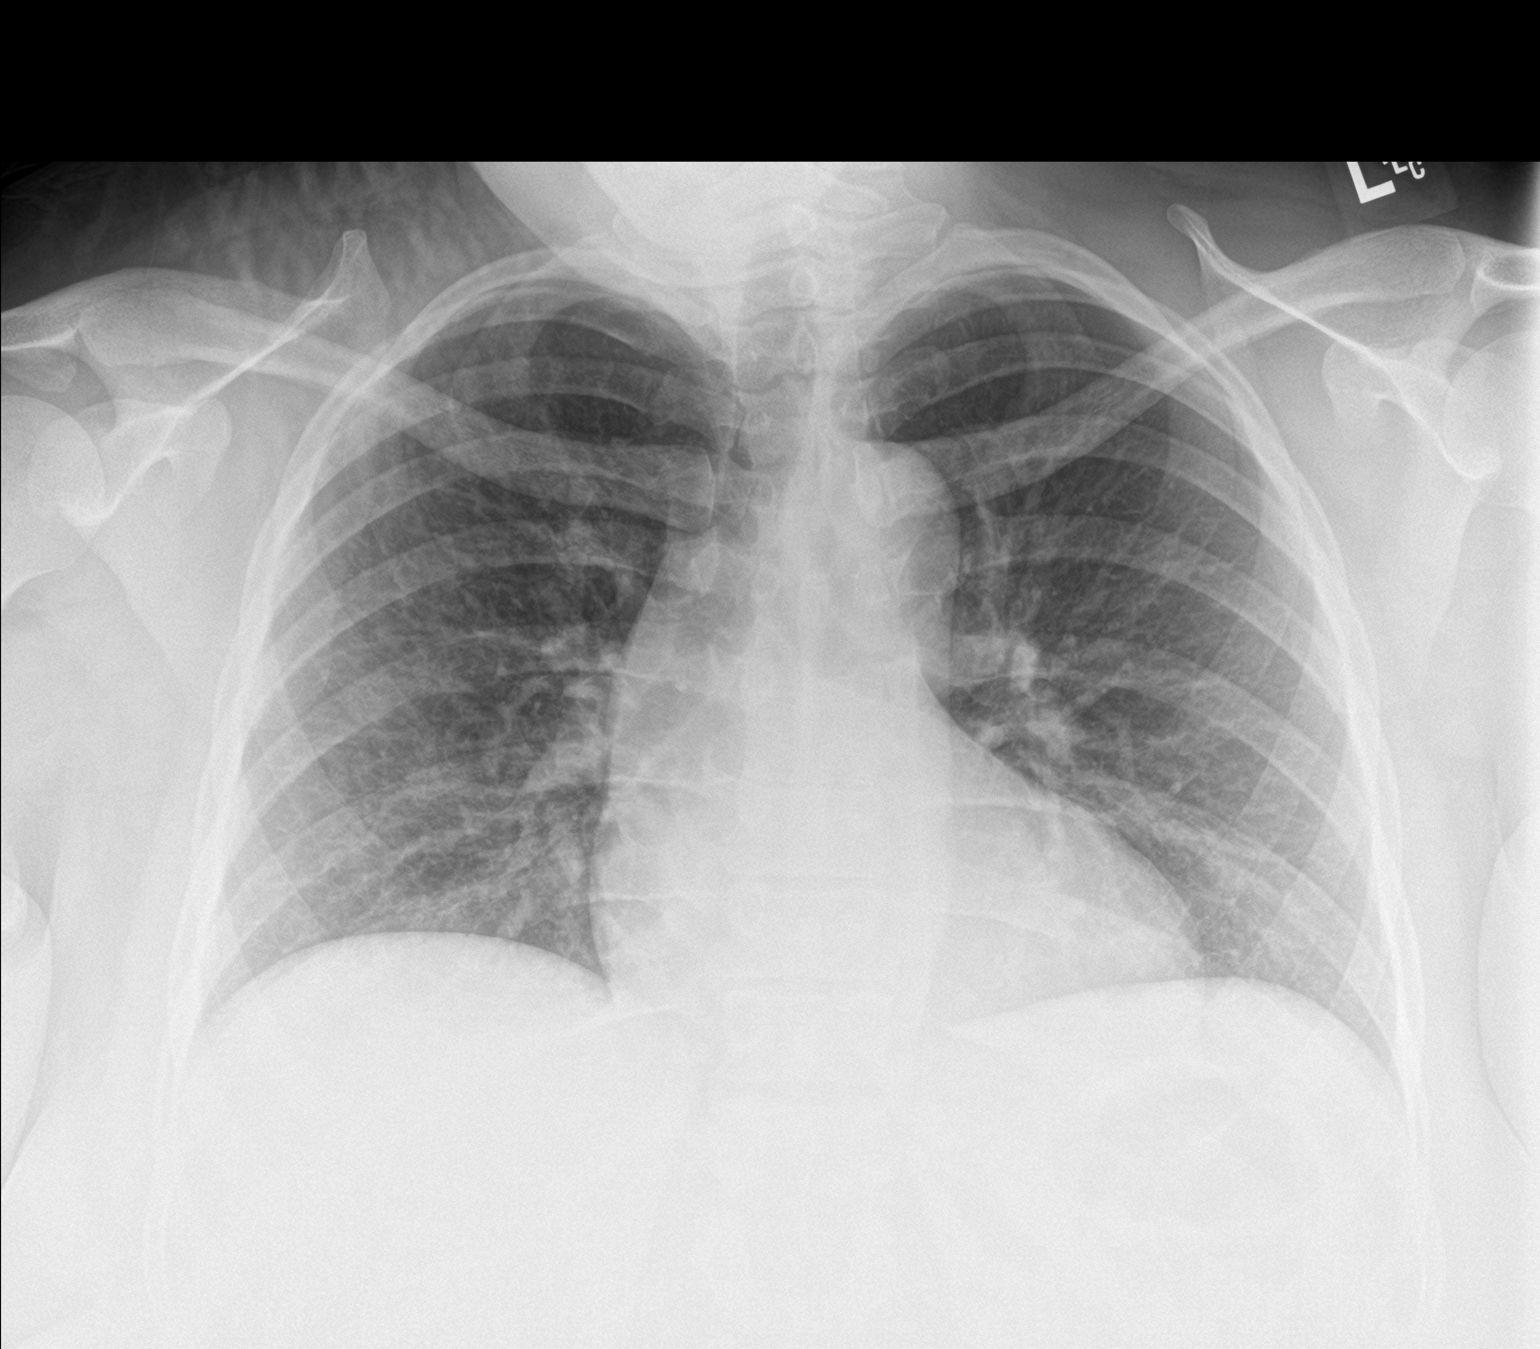

[chest lat]
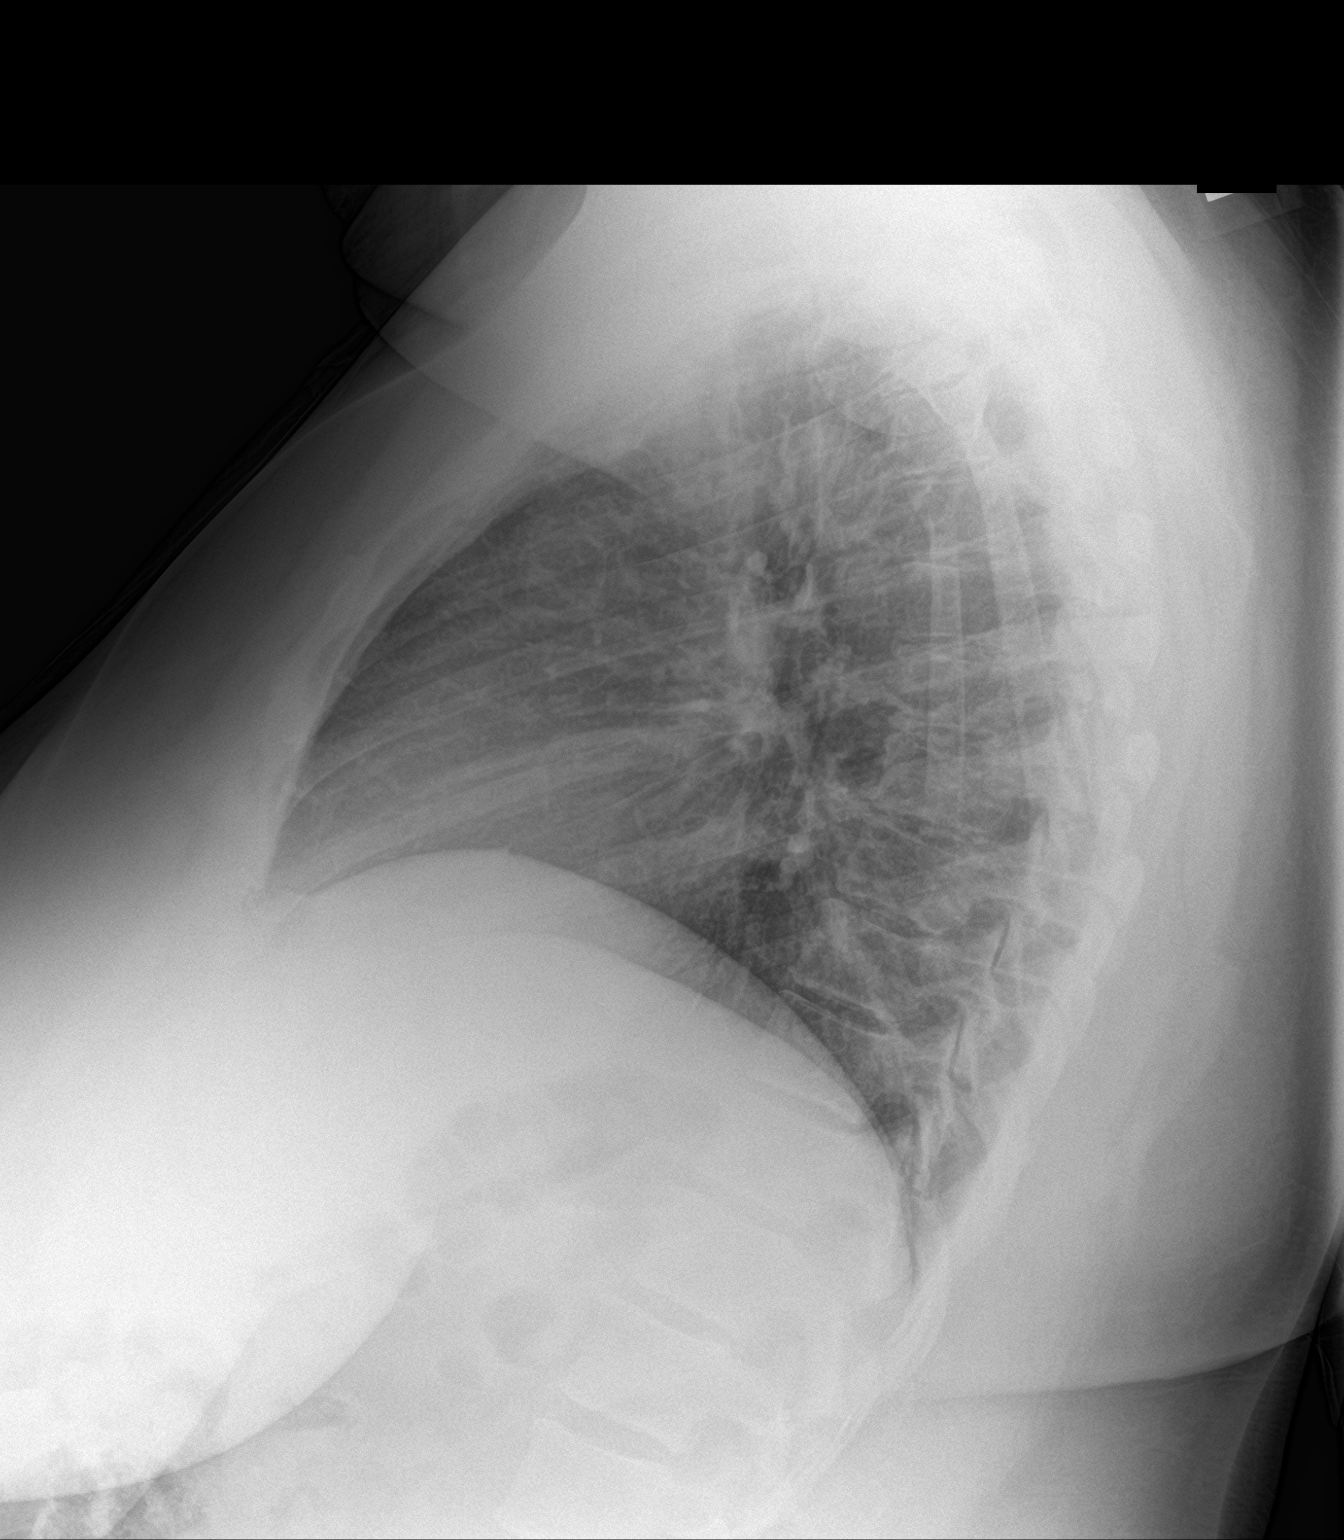

[2 of 2 positions shown; findings below may reference images not displayed]

FINDINGS: The heart size and mediastinal contours are within normal limits.
There is no evidence of pulmonary edema, consolidation,
pneumothorax, nodule or pleural fluid. The visualized skeletal
structures are unremarkable.
IMPRESSION: No active cardiopulmonary disease.

## 2018-08-16 ENCOUNTER — Encounter: Payer: 59 | Attending: Physical Medicine & Rehabilitation | Admitting: Physical Medicine & Rehabilitation

## 2018-08-16 ENCOUNTER — Other Ambulatory Visit: Payer: Self-pay

## 2018-08-16 ENCOUNTER — Encounter: Payer: Self-pay | Admitting: Physical Medicine & Rehabilitation

## 2018-08-16 VITALS — Ht 70.0 in | Wt 333.0 lb

## 2018-08-16 DIAGNOSIS — M797 Fibromyalgia: Secondary | ICD-10-CM | POA: Diagnosis not present

## 2018-08-16 DIAGNOSIS — R251 Tremor, unspecified: Secondary | ICD-10-CM | POA: Insufficient documentation

## 2018-08-16 DIAGNOSIS — Z6841 Body Mass Index (BMI) 40.0 and over, adult: Secondary | ICD-10-CM | POA: Insufficient documentation

## 2018-08-16 DIAGNOSIS — F419 Anxiety disorder, unspecified: Secondary | ICD-10-CM

## 2018-08-16 DIAGNOSIS — M545 Low back pain: Secondary | ICD-10-CM | POA: Insufficient documentation

## 2018-08-16 DIAGNOSIS — G894 Chronic pain syndrome: Secondary | ICD-10-CM

## 2018-08-16 DIAGNOSIS — G479 Sleep disorder, unspecified: Secondary | ICD-10-CM

## 2018-08-16 DIAGNOSIS — Z86711 Personal history of pulmonary embolism: Secondary | ICD-10-CM | POA: Insufficient documentation

## 2018-08-16 DIAGNOSIS — R269 Unspecified abnormalities of gait and mobility: Secondary | ICD-10-CM

## 2018-08-16 DIAGNOSIS — J449 Chronic obstructive pulmonary disease, unspecified: Secondary | ICD-10-CM | POA: Insufficient documentation

## 2018-08-16 DIAGNOSIS — G43909 Migraine, unspecified, not intractable, without status migrainosus: Secondary | ICD-10-CM | POA: Insufficient documentation

## 2018-08-16 DIAGNOSIS — E1139 Type 2 diabetes mellitus with other diabetic ophthalmic complication: Secondary | ICD-10-CM | POA: Insufficient documentation

## 2018-08-16 DIAGNOSIS — D219 Benign neoplasm of connective and other soft tissue, unspecified: Secondary | ICD-10-CM | POA: Insufficient documentation

## 2018-08-16 DIAGNOSIS — M791 Myalgia, unspecified site: Secondary | ICD-10-CM

## 2018-08-16 DIAGNOSIS — M359 Systemic involvement of connective tissue, unspecified: Secondary | ICD-10-CM

## 2018-08-16 DIAGNOSIS — H42 Glaucoma in diseases classified elsewhere: Secondary | ICD-10-CM | POA: Insufficient documentation

## 2018-08-16 MED ORDER — DULOXETINE HCL 60 MG PO CPEP: 60.0000 mg | ORAL_CAPSULE | Freq: Every day | ORAL | 2 refills | Status: DC

## 2018-08-16 NOTE — Progress Notes (Signed)
Subjective:    Patient ID: Holly Mcdonald, female    DOB: Jul 17, 1975, 43 y.o.   MRN: [id number]  TELEHEALTH NOTE  Due to national recommendations of social distancing due to COVID 19, an audio/video telehealth visit is felt to be most appropriate for this patient at this time.  See Chart message from today for the patient's consent to telehealth from [facility].     I verified that I am speaking with the correct person using two identifiers.  Location of patient: Home Location of provider: Office Method of communication: Webex Names of participants : Holly Mcdonald scheduling, Holly Mcdonald obtaining consent and vitals if available Established patient Time spent on call: 56 min  HPI 43 year old female with past medical history of tremors, pulmonary embolism, morbid obesity, migraines, fibromyalgia, prediabetes, asthma, fibroids, chronic bronchitis presents with fibromyalgia.  Initially stated: Started 10/2016 after diagnosed PE.  Progressively getting worse.  Heat improved the pain.  Fibroids have exacerbated the pain, with surgery planned for 10/17/17.  Activity exacerbates the pain.  All qualities of pain.  Non-radiating.  Constant.  Associated muscle spasms and tingling.  Tylenol does not help.  Cymbalta provides some benefit. Denies falls. Pain limits all activities.   Works as Therapist, art.    Last clinic visit 05/17/2018.  Since that time, patient went to the ED with jaw pain and was diagnosed with muscular strain.  She also saw neuropsychology since last visit.  Since last visit, patient states she is following up with Neuropsych. She has not tried cold.  She states she has not relied on using heat as much.  She has not been able to go to pool.  She is using TENS with benefit.  She continues to take Lidoderm patch, Lyrica, Baclofen, Robaxin as prescribed. She continues to follow up with Rheum. States she had a fall when she stood up and  fell back down on her sofa. Sleep has improved. She has not lost or gained weight, states she needs to cut back on sweats. She needs to follow up with Nutritionist.    Pain Inventory Average Pain 3 Pain Right Now 3 My pain is intermittent, dull and aching  In the last 24 hours, has pain interfered with the following? General activity 2 Relation with others 0 Enjoyment of life 0 What TIME of day is your pain at its worst? all Sleep (in general) Good  Pain is worse with: walking, sitting, standing and some activites Pain improves with: rest, heat/ice, therapy/exercise, pacing activities and medication Relief from Meds: 5  Mobility walk without assistance how many minutes can you walk? 15-20 ability to climb steps?  yes do you drive?  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  no  Physicians involved in your care Primary care Dr. [name]   Family History  Problem Relation Age of Onset   Pulmonary embolism Mother        Last year   Mental retardation Mother        PTSD and fibromyalgia   Heart disease Mother        CHF   Congestive Heart Failure Mother    Heart disease Father 47       CAD   Hyperlipidemia Father    Diabetes Paternal Grandmother    Heart attack Paternal Grandmother    Congestive  Heart Failure Maternal Aunt    Cancer Neg Hx    Social History   Socioeconomic History   Marital status: Divorced    Spouse name: Not on file   Number of children: Not on file   Years of education: Not on file   Highest education level: Not on file  Occupational History   Occupation: representative    Comment: [employer]  Social Needs   Financial resource strain: Not on file   Food insecurity:    Worry: Not on file    Inability: Not on file   Transportation needs:    Medical: Not on file     Non-medical: Not on file  Tobacco Use   Smoking status: Never Smoker   Smokeless tobacco: Never Used  Substance and Sexual Activity   Alcohol use: Yes    Comment: occasionally   Drug use: No   Sexual activity: Yes    Birth control/protection: None, I.U.D.    Comment: 1st intercourse 43 yo-More than 5 partners Mirena 09/16/2017  Lifestyle   Physical activity:    Days per week: Not on file    Minutes per session: Not on file   Stress: Not on file  Relationships   Social connections:    Talks on phone: Not on file    Gets together: Not on file    Attends religious service: Not on file    Active member of club or organization: Not on file    Attends meetings of clubs or organizations: Not on file    Relationship status: Not on file  Other Topics Concern   Not on file  Social History Narrative   Not on file   Past Surgical History:  Procedure Laterality Date   ANAL EXAMINATION UNDER ANESTHESIA  age 14   w/ removal cyst   DILATATION & CURETTAGE/HYSTEROSCOPY WITH MYOSURE N/A 10/17/2017   Procedure: [employer];  Surgeon: Anastasio Auerbach, MD;  Location: [facility];  Service: Gynecology;  Laterality: N/A;   DILATATION & CURETTAGE/HYSTEROSCOPY WITH MYOSURE N/A 04/11/2018   Procedure: DILATATION & CURETTAGE/HYSTEROSCOPY WITH MYOSURE;  Surgeon: Anastasio Auerbach, MD;  Location: [facility];  Service: Gynecology;  Laterality: N/A;   GYNECOLOGIC CRYOSURGERY  age 33s   INTRAUTERINE DEVICE INSERTION  04/18/2018   TONSILLECTOMY  age 48   TRANSTHORACIC ECHOCARDIOGRAM  01/17/2017   ef 60-65%/  mild TR   Past Medical History:  Diagnosis Date   Anticoagulated    eliquis   Arthritis    inflammatory   Bladder spasm    Connective tissue disease ([place]) dx oct 2019   Fibromyalgia    GERD (gastroesophageal reflux disease)    Heavy menstrual bleeding    resolved with megace   History of chronic  bronchitis    last flare up 11-18-17   History of pulmonary embolism 10/2016--- followed by pcp and pulmoloigst   dx bilateral PE , bilateral lower lobes , segmental , unprovoked, negative hypercoagulable work-up-- treated w/ eliquis   Immune deficiency disorder ([place])    Iron deficiency anemia    Migraine    Mild intermittent asthma    pulmologist-  dr Elsworth Soho   Mixed connective tissue disease ([place])    Morbid obesity with BMI of 45.0-49.9, adult ([place])    Open-angle glaucoma of both eyes    Pre-diabetes    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   bilateral ---- stopped eliquis   Urgency of urination    Uterine fibroid  Vitamin D deficiency    Wears glasses    Ht 5\' 10"  (1.778 m)    Wt (!) 333 lb (151 kg)    BMI 47.78 kg/m   Opioid Risk Score:   Fall Risk Score:  `1  Depression screen PHQ 2/9  Depression screen [facility] 2/9 05/17/2018 01/12/2018 10/07/2017  Decreased Interest 0 0 0  Down, Depressed, Hopeless 0 0 0  PHQ - 2 Score 0 0 0  Altered sleeping - - 1  Tired, decreased energy - - 3  Change in appetite - - 3  Feeling bad or failure about yourself  - - 0  Trouble concentrating - - 0  Moving slowly or fidgety/restless - - 0  Suicidal thoughts - - 0  PHQ-9 Score - - 7  Difficult doing work/chores - - Somewhat difficult   Review of Systems  Constitutional: Negative.   HENT: Negative.   Eyes: Negative.   Respiratory: Negative.   Cardiovascular: Negative.   Gastrointestinal: Negative.   Endocrine: Negative.   Genitourinary: Positive for difficulty urinating.  Musculoskeletal: Positive for arthralgias, back pain, myalgias and neck pain.  Skin: Negative.   Allergic/Immunologic: Negative.   Neurological: Positive for tremors, numbness and headaches.       Tingling  Hematological: Negative.   Psychiatric/Behavioral: The patient is nervous/anxious.   All other systems reviewed and are negative.     Objective:   Physical Exam Gen: NAD. HENT: Normocephalic,  Atraumatic Eyes: EOMI. No discharge.  Cardio: No JVD. Pulm: Effort normal Neuro: Alert and oriented    Assessment & Plan:  43 year old female with past medical history of tremors, pulmonary embolism, morbid obesity, migraines, fibromyalgia, prediabetes, asthma, fibroids, chronic bronchitis presents with pain all over.   1. Chronic diffuse pain  Multifactorial - Fibromyalgia, connective tissue disorder, inflammatory arthritis, fibroids  Side effects with Gabapentin  Neck xray reviewed, showing mild disc narrowing at C7-T1.  Cont Heat, trial cold  Encouraged pool therapy (completed aquatic therapy), reminded again  Cont TENS IT with benefit   Cont Lidoderm OTC   Cont Lyrica 75 TID  Cont Baclofen 10 qhs, unable to tolerate during the day  Cont Robaxin 500 BID PRN  Cont to follow up with Psychology   Collow up with Rheum - started on Plaquinil   Will consider accupuncture if necessary  Will consider NSAID if necessary  Will order order Cymbalta 60mg     2. Gait abnormality  Does not believe she needs cane at present  3. Sleep disturbance  Cont meds  4. Morbid Obesity  Cont to follow up with nutritionist, reminded  No weight loss at present  5. Myalgia   Will consider trigger point injections when able to tolerate  See #1  6. Migraines  Cont Topamax

## 2018-09-07 ENCOUNTER — Encounter: Payer: Self-pay | Admitting: Psychology

## 2018-09-07 ENCOUNTER — Encounter

## 2018-09-07 ENCOUNTER — Other Ambulatory Visit: Payer: Self-pay

## 2018-09-07 ENCOUNTER — Encounter (HOSPITAL_BASED_OUTPATIENT_CLINIC_OR_DEPARTMENT_OTHER): Payer: 59 | Admitting: Psychology

## 2018-09-07 DIAGNOSIS — M359 Systemic involvement of connective tissue, unspecified: Secondary | ICD-10-CM | POA: Diagnosis not present

## 2018-09-07 DIAGNOSIS — R251 Tremor, unspecified: Secondary | ICD-10-CM | POA: Diagnosis not present

## 2018-09-07 DIAGNOSIS — G43909 Migraine, unspecified, not intractable, without status migrainosus: Secondary | ICD-10-CM | POA: Diagnosis not present

## 2018-09-07 DIAGNOSIS — E1139 Type 2 diabetes mellitus with other diabetic ophthalmic complication: Secondary | ICD-10-CM | POA: Diagnosis not present

## 2018-09-07 DIAGNOSIS — M545 Low back pain: Secondary | ICD-10-CM | POA: Diagnosis not present

## 2018-09-07 DIAGNOSIS — M797 Fibromyalgia: Secondary | ICD-10-CM | POA: Diagnosis present

## 2018-09-07 DIAGNOSIS — G894 Chronic pain syndrome: Secondary | ICD-10-CM

## 2018-09-07 DIAGNOSIS — R269 Unspecified abnormalities of gait and mobility: Secondary | ICD-10-CM | POA: Diagnosis not present

## 2018-09-07 DIAGNOSIS — F419 Anxiety disorder, unspecified: Secondary | ICD-10-CM | POA: Diagnosis not present

## 2018-09-07 DIAGNOSIS — Z86711 Personal history of pulmonary embolism: Secondary | ICD-10-CM | POA: Diagnosis not present

## 2018-09-07 DIAGNOSIS — Z6841 Body Mass Index (BMI) 40.0 and over, adult: Secondary | ICD-10-CM | POA: Diagnosis not present

## 2018-09-07 DIAGNOSIS — D219 Benign neoplasm of connective and other soft tissue, unspecified: Secondary | ICD-10-CM | POA: Diagnosis not present

## 2018-09-07 DIAGNOSIS — G479 Sleep disorder, unspecified: Secondary | ICD-10-CM

## 2018-09-07 DIAGNOSIS — J449 Chronic obstructive pulmonary disease, unspecified: Secondary | ICD-10-CM | POA: Diagnosis not present

## 2018-09-07 DIAGNOSIS — H42 Glaucoma in diseases classified elsewhere: Secondary | ICD-10-CM | POA: Diagnosis not present

## 2018-09-07 NOTE — Progress Notes (Addendum)
Neuropsychology Visit  Patient:  Holly Mcdonald   DOB: May 09, 1976  MR Number: [id number]  Location: [facility] [place], [place] [place] [id number] [address] [place] [address] Dept: [phone]  Date of Service: 09/07/2018  Start: 8 AM End: 9 AM  Today's visit was conducted over video conference utilizing WebEx.  Technically, there was good sound and audio connection for this visit.  The patient was explained the limitations of this type of visit and agreed to conduct this visit over video conference.  The patient had experience of previous WebEx tele-visits.  The patient's identity was confirmed through 2 forms of verbal confirmation with specific questions used to form and confirm identity.    Duration of Service: 1 Hour  Provider/Observer:     Edgardo Roys PsyD  Chief Complaint:      Chief Complaint  Patient presents with  . Pain  . Other    Shoulder pain  . Anxiety  . Stress    Reason For Service:      Raihana Balderrama is a 43 year old female referred by Dr. Posey Pronto for neuropsychological consultation and therapy.  The patient has been dealing with significant fibromyalgia and other chronic pain conditions and chronic medical conditions for nearly 2 years now.  The patient has been diagnosed with fibromyalgia, autoimmune disorder, migraines, fibroids, inflammatory arthritis, and connective tissue disease.  The patient reports that in June 2018 she had a pulmonary embolism and began having significant problems following that.  The patient reports that the pain began throughout her body shortly after this that is now been diagnosed as fibromyalgia and was diagnosed with fibromyalgia in August 2018.  The patient had been in what she describes as a "flareup" for 2 months.  The patient reports that she is also been seen by rheumatologist who diagnosed her with an autoimmune  disorder in September 2019.  The patient has migraines but are well managed as well as recent surgeries for fibroids.  The patient is also had bladder issues as well as retinal hemorrhage in her eye.  The patient does report that she is hopeful that now they are starting to identify some of the issues that are going on she is still very stressed by both some of the medical issues that she is gone in and their severity as well as worried that the concerned that she may develop lupus or other progression in her autoimmune issues looms heavily over her.  The above reason for service was reviewed for this visit and remains applicable for the current visit.  The patient continues to have significant issues with pain particularly in her shoulders, hips and coccyx bone.  The patient reports that she has been sleeping better more recently after Dr. Posey Pronto renewed her fluoxetine.  Treatment Interventions:  Cognitive/behavioral therapeutic interventions and working on coping and treatment plans and strategies.  Participation Level:   Active  Participation Quality:  Appropriate and Attentive      Behavioral Observation:  Well Groomed, Alert, and Appropriate.   Current Psychosocial Factors: The patient reports that some of the stressors from work Improved as she has been giving a new title at work and has more authority in performing some activities with work.  The patient reports that this is reduce some of the stress she experiences at work.  He continues to have some significant limitations of function even beyond current social distancing rules for COVID-19.  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  Content of Session:   Reviewed current symptoms and continue to work on therapeutic  interventions around coping and adjustment with issues of anxiety, chronic pain associated with fibromyalgia and inflammatory conditions and dealing with her pulmonary and breathing issues.  Effectiveness of Interventions: Today's visit flowed well via WebEx and were able to go over many issues related to coping and adjusting.  The patient has already been actively working on some of the therapeutic interventions we have developed.  Target Goals:   Target goals include working on building better coping skills around stress and anxiety and working on specific chronic pain management issues and goals for fibromyalgia type  Goals Last Reviewed:   09/07/2018  Goals Addressed Today:    Goals addressed today had to do with specific issues around pain management and stress management as well as issues related to her underlying anxiety symptoms.  Impression/Diagnosis:   Helen-Marie Brandenburger is a 43 year old female referred by Dr. Posey Pronto for neuropsychological consultation and therapy.  The patient has been dealing with significant fibromyalgia and other chronic pain conditions and chronic medical conditions for nearly 2 years now.  The patient has been diagnosed with fibromyalgia, autoimmune disorder, migraines, fibroids, inflammatory arthritis, and connective tissue disease.  The patient reports that in June 2018 she had a pulmonary embolism and began having significant problems following that.  The patient reports that the pain began throughout her body shortly after this that is now been diagnosed as fibromyalgia and was diagnosed with fibromyalgia in August 2018.  The patient had been in what she describes as a "flareup" for 2 months.  The patient reports that she is also been seen by rheumatologist who diagnosed her with an autoimmune disorder in September 2019.  The patient has migraines but are well managed as well as recent surgeries for fibroids.  The patient is also had bladder issues as well as retinal  hemorrhage in her eye.  The patient does report that she is hopeful that now they are starting to identify some of the issues that are going on she is still very stressed by both some of the medical issues that she is gone in and their severity as well as worried that the concerned that she may develop lupus or other progression in her autoimmune issues looms heavily over her.  The patient does report that she has had some anxiety and depression symptoms in response to some of her complex medical issues.  The patient was referred for therapeutic interventions to help better manage and cope with her current situation.  There is also been concern in the past about possible sleep apnea and the patient did have a sleep study years ago that did not show sleep apnea.  However, I do have some concerns that given changes with the patient over the past several years that sleep apnea may be a condition of concern.  I will talk with Dr. Posey Pronto about seeing if we can get a sleep study ordered as her health insurance company [employer] [employer] has a program where they will do an initial home sleep study that is available to the patient.  Diagnosis:   Fibromyalgia  Chronic pain syndrome  Connective tissue disease (HCC)  Sleep disorder  Anxiety  disorder, unspecified type    Ilean Skill, Psy.D. Clinical Psychologist Neuropsychologist

## 2018-09-14 ENCOUNTER — Other Ambulatory Visit: Payer: Self-pay

## 2018-09-14 DIAGNOSIS — J4530 Mild persistent asthma, uncomplicated: Secondary | ICD-10-CM

## 2018-09-14 MED ORDER — ALBUTEROL SULFATE HFA 108 (90 BASE) MCG/ACT IN AERS: 2.0000 | INHALATION_SPRAY | Freq: Four times a day (QID) | RESPIRATORY_TRACT | 0 refills | Status: DC | PRN

## 2018-09-21 ENCOUNTER — Encounter: Payer: 59 | Attending: Physical Medicine & Rehabilitation | Admitting: Psychology

## 2018-09-21 ENCOUNTER — Other Ambulatory Visit: Payer: Self-pay

## 2018-09-21 ENCOUNTER — Encounter: Payer: Self-pay | Admitting: Psychology

## 2018-09-21 DIAGNOSIS — G479 Sleep disorder, unspecified: Secondary | ICD-10-CM | POA: Insufficient documentation

## 2018-09-21 DIAGNOSIS — J449 Chronic obstructive pulmonary disease, unspecified: Secondary | ICD-10-CM | POA: Diagnosis not present

## 2018-09-21 DIAGNOSIS — M797 Fibromyalgia: Secondary | ICD-10-CM | POA: Diagnosis not present

## 2018-09-21 DIAGNOSIS — M359 Systemic involvement of connective tissue, unspecified: Secondary | ICD-10-CM

## 2018-09-21 DIAGNOSIS — Z86711 Personal history of pulmonary embolism: Secondary | ICD-10-CM | POA: Diagnosis not present

## 2018-09-21 DIAGNOSIS — G43909 Migraine, unspecified, not intractable, without status migrainosus: Secondary | ICD-10-CM | POA: Insufficient documentation

## 2018-09-21 DIAGNOSIS — R269 Unspecified abnormalities of gait and mobility: Secondary | ICD-10-CM | POA: Diagnosis not present

## 2018-09-21 DIAGNOSIS — F419 Anxiety disorder, unspecified: Secondary | ICD-10-CM | POA: Insufficient documentation

## 2018-09-21 DIAGNOSIS — R251 Tremor, unspecified: Secondary | ICD-10-CM | POA: Diagnosis not present

## 2018-09-21 DIAGNOSIS — E1139 Type 2 diabetes mellitus with other diabetic ophthalmic complication: Secondary | ICD-10-CM | POA: Diagnosis not present

## 2018-09-21 DIAGNOSIS — H42 Glaucoma in diseases classified elsewhere: Secondary | ICD-10-CM | POA: Diagnosis not present

## 2018-09-21 DIAGNOSIS — M545 Low back pain: Secondary | ICD-10-CM | POA: Diagnosis not present

## 2018-09-21 DIAGNOSIS — G894 Chronic pain syndrome: Secondary | ICD-10-CM | POA: Diagnosis not present

## 2018-09-21 DIAGNOSIS — D219 Benign neoplasm of connective and other soft tissue, unspecified: Secondary | ICD-10-CM | POA: Insufficient documentation

## 2018-09-21 DIAGNOSIS — Z6841 Body Mass Index (BMI) 40.0 and over, adult: Secondary | ICD-10-CM | POA: Diagnosis not present

## 2018-09-21 NOTE — Progress Notes (Signed)
Neuropsychology Visit  Patient:  Holly Mcdonald   DOB: Dec 14, 1975  MR Number: [id number]  Location: [facility] [place], [place] [id number] [address] [place] [address] Dept: [phone]  Date of Service: 09/07/2018  Start: 9 AM End: 10 AM  Today's visit was conducted In-person with patient present in my office.   Duration of Service: 1 hour  Provider/Observer:     Edgardo Roys PsyD  Chief Complaint:      Chief Complaint  Patient presents with  . Pain  . Anxiety  . Stress    Reason For Service:      Holly Mcdonald is a 43 year old female referred by Dr. Posey Pronto for neuropsychological consultation and therapy.  The patient has been dealing with significant fibromyalgia and other chronic pain conditions and chronic medical conditions for nearly 2 years now.  The patient has been diagnosed with fibromyalgia, autoimmune disorder, migraines, fibroids, inflammatory arthritis, and connective tissue disease.  The patient reports that in June 2018 she had a pulmonary embolism and began having significant problems following that.  The patient reports that the pain began throughout her body shortly after this that is now been diagnosed as fibromyalgia and was diagnosed with fibromyalgia in August 2018.  The patient had been in what she describes as a "flareup" for 2 months.  The patient reports that she is also been seen by rheumatologist who diagnosed her with an autoimmune disorder in September 2019.  The patient has migraines but are well managed as well as recent surgeries for fibroids.  The patient is also had bladder issues as well as retinal hemorrhage in her eye.  The patient does report that she is hopeful that now they are starting to identify some of the issues that are going on she is still very stressed by both some of the medical issues that she is gone in and  their severity as well as worried that the concerned that she may develop lupus or other progression in her autoimmune issues looms heavily over her.  The above reason for service was reviewed with this visit and remains applicable for the current visit.  The patient continues to have significant issues with pain particularly her right shoulder, hips and coccyx bone.  The patient says she has been sleeping better until her shoulder started causing her problems.  The patient reports that she is doing quite well with the fluoxetine.  Interventions:  Cognitive/behavioral therapeutic interventions and working on coping and treatment plans and strategies.  Participation Level:   Active  Participation Quality:  Appropriate and Attentive      Behavioral Observation:  Well Groomed, Alert, and Appropriate.   Current Psychosocial Factors: The patient reports that she has been doing much better as far as her coping and adjustment issues.  The patient reports that she continues to have significant issues with her work training centered around her chronic pain symptoms.  Content of Session:   Reviewed current symptoms and continue to work on therapeutic interventions around coping and adjustment with issues of anxiety, chronic pain associated with fibromyalgia and inflammatory conditions and dealing with her pulmonary and breathing issues.  Effectiveness of Interventions: Today's visit flowed well via WebEx and were able to go over many issues related to coping and adjusting.  The patient has already been actively working on some of the therapeutic interventions we have developed.  Target Goals:   Target goals include working  on building better coping skills around stress and anxiety and working on specific chronic pain management issues and goals for fibromyalgia type  Goals Last Reviewed:   09/21/2018  Goals Addressed Today:    Goals addressed today had to do with specific issues around pain management and  stress management as well as issues related to her underlying anxiety symptoms.  Impression/Diagnosis:   Holly Mcdonald is a 43 year old female referred by Dr. Posey Pronto for neuropsychological consultation and therapy.  The patient has been dealing with significant fibromyalgia and other chronic pain conditions and chronic medical conditions for nearly 2 years now.  The patient has been diagnosed with fibromyalgia, autoimmune disorder, migraines, fibroids, inflammatory arthritis, and connective tissue disease.  The patient reports that in June 2018 she had a pulmonary embolism and began having significant problems following that.  The patient reports that the pain began throughout her body shortly after this that is now been diagnosed as fibromyalgia and was diagnosed with fibromyalgia in August 2018.  The patient had been in what she describes as a "flareup" for 2 months.  The patient reports that she is also been seen by rheumatologist who diagnosed her with an autoimmune disorder in September 2019.  The patient has migraines but are well managed as well as recent surgeries for fibroids.  The patient is also had bladder issues as well as retinal hemorrhage in her eye.  The patient does report that she is hopeful that now they are starting to identify some of the issues that are going on she is still very stressed by both some of the medical issues that she is gone in and their severity as well as worried that the concerned that she may develop lupus or other progression in her autoimmune issues looms heavily over her.  The patient does report that she has had some anxiety and depression symptoms in response to some of her complex medical issues.  The patient was referred for therapeutic interventions to help better manage and cope with her current situation.  There is also been concern in the past about possible sleep apnea and the patient did have a sleep study years ago that did not show sleep apnea.   However, I do have some concerns that given changes with the patient over the past several years that sleep apnea may be a condition of concern.  I will talk with Dr. Posey Pronto about seeing if we can get a sleep study ordered as her health insurance company [employer] has a program where they will do an initial home sleep study that is available to the patient.  Diagnosis:   Fibromyalgia  Chronic pain syndrome  Connective tissue disease (HCC)  Sleep disorder  Anxiety disorder, unspecified type    Ilean Skill, Psy.D. Clinical Psychologist Neuropsychologist

## 2018-10-03 ENCOUNTER — Other Ambulatory Visit: Payer: Self-pay | Admitting: Allergy

## 2018-10-03 DIAGNOSIS — J4530 Mild persistent asthma, uncomplicated: Secondary | ICD-10-CM

## 2018-10-10 ENCOUNTER — Other Ambulatory Visit: Payer: Self-pay | Admitting: Hematology

## 2018-10-12 ENCOUNTER — Other Ambulatory Visit: Payer: Self-pay

## 2018-10-12 ENCOUNTER — Encounter: Payer: Self-pay | Admitting: Psychology

## 2018-10-12 ENCOUNTER — Encounter: Payer: 59 | Attending: Physical Medicine & Rehabilitation | Admitting: Psychology

## 2018-10-12 DIAGNOSIS — Z86711 Personal history of pulmonary embolism: Secondary | ICD-10-CM | POA: Insufficient documentation

## 2018-10-12 DIAGNOSIS — D219 Benign neoplasm of connective and other soft tissue, unspecified: Secondary | ICD-10-CM | POA: Diagnosis not present

## 2018-10-12 DIAGNOSIS — M797 Fibromyalgia: Secondary | ICD-10-CM | POA: Diagnosis not present

## 2018-10-12 DIAGNOSIS — Z6841 Body Mass Index (BMI) 40.0 and over, adult: Secondary | ICD-10-CM | POA: Diagnosis not present

## 2018-10-12 DIAGNOSIS — F419 Anxiety disorder, unspecified: Secondary | ICD-10-CM

## 2018-10-12 DIAGNOSIS — G894 Chronic pain syndrome: Secondary | ICD-10-CM

## 2018-10-12 DIAGNOSIS — M359 Systemic involvement of connective tissue, unspecified: Secondary | ICD-10-CM

## 2018-10-12 DIAGNOSIS — M545 Low back pain: Secondary | ICD-10-CM | POA: Diagnosis not present

## 2018-10-12 DIAGNOSIS — E1139 Type 2 diabetes mellitus with other diabetic ophthalmic complication: Secondary | ICD-10-CM | POA: Insufficient documentation

## 2018-10-12 DIAGNOSIS — H42 Glaucoma in diseases classified elsewhere: Secondary | ICD-10-CM | POA: Diagnosis not present

## 2018-10-12 DIAGNOSIS — J449 Chronic obstructive pulmonary disease, unspecified: Secondary | ICD-10-CM | POA: Diagnosis not present

## 2018-10-12 DIAGNOSIS — G479 Sleep disorder, unspecified: Secondary | ICD-10-CM | POA: Diagnosis not present

## 2018-10-12 DIAGNOSIS — R251 Tremor, unspecified: Secondary | ICD-10-CM | POA: Diagnosis not present

## 2018-10-12 DIAGNOSIS — G43909 Migraine, unspecified, not intractable, without status migrainosus: Secondary | ICD-10-CM | POA: Insufficient documentation

## 2018-10-12 DIAGNOSIS — R269 Unspecified abnormalities of gait and mobility: Secondary | ICD-10-CM | POA: Diagnosis not present

## 2018-10-12 NOTE — Addendum Note (Signed)
Addended by: Marland Mcalpine B on: 10/12/2018 11:56 AM   Modules accepted: Orders

## 2018-10-12 NOTE — Progress Notes (Signed)
Neuropsychology Visit  Patient:  Holly Mcdonald   DOB: [birth year]  MR Number: [id number]  Location: [facility] [place], [place] [id number] [place] [address] Dept: [phone]  Date of Service: 10/12/2018  Start: 10:15 AM End: 10:50 AM  Today's visit was an in person visit conducted at my office in the outpatient clinic office.  Duration of Service: 1 hour  Provider/Observer:     Edgardo Roys PsyD  Chief Complaint:      Chief Complaint  Patient presents with  . Pain  . Anxiety  . Stress    Reason For Service:      Holly Mcdonald is a 43 year old female referred by Dr. Posey Pronto for neuropsychological consultation and therapy.  The patient has been dealing with significant fibromyalgia and other chronic pain conditions and chronic medical conditions for nearly 2 years now.  The patient has been diagnosed with fibromyalgia, autoimmune disorder, migraines, fibroids, inflammatory arthritis, and connective tissue disease.  The patient reports that in June 2018 she had a pulmonary embolism and began having significant problems following that.  The patient reports that the pain began throughout her body shortly after this that is now been diagnosed as fibromyalgia and was diagnosed with fibromyalgia in August 2018.  The patient had been in what she describes as a "flareup" for 2 months.  The patient reports that she is also been seen by rheumatologist who diagnosed her with an autoimmune disorder in September 2019.  The patient has migraines but are well managed as well as recent surgeries for fibroids.  The patient is also had bladder issues as well as retinal hemorrhage in her eye.  The patient does report that she is hopeful that now they are starting to identify some of the issues that are going on she is still very stressed by both some of the medical issues  that she is gone in and their severity as well as worried that the concerned that she may develop lupus or other progression in her autoimmune issues looms heavily over her.   The above reason for service was reviewed for this visit and remains applicable for the current visit.  The patient reports that she continues to have significant pain particularly in her right shoulder but also has pain in her hips and coccyx bone.  The patient reports that she has slept better and has been working on getting outside during the day and working on issues related to her mood.  The patient reports that she is still doing quite well on fluoxetine.  We are working to get the patient referred for a sleep study.  Interventions:  Cognitive/behavioral therapeutic interventions and working on coping and treatment plans and strategies.  Participation Level:   Active  Participation Quality:  Appropriate and Attentive      Behavioral Observation:  Well Groomed, Alert, and Appropriate.   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  Now that they are planning on having her coworkers return to the office she will be going back to home to work from home to avoid contact with others.  Content of Session:   Reviewed current symptoms and continue to work on therapeutic interventions around coping and adjustment with issues of anxiety, chronic pain associated with fibromyalgia and inflammatory conditions that she is having  to deal with.  Effectiveness of Interventions: Today's visit was an in person visit and we continue to work on therapeutic interventions.  The patient remains highly motivated and active in the sessions and she is experiencing improvements in her overall symptoms but she continues to have significant pain and inflammation issues.  Target Goals:   Target goals include working on building better coping  skills around stress and anxiety and working on specific chronic pain management issues and goals for fibromyalgia type  Goals Last Reviewed:   10/12/2018  Goals Addressed Today:    Goals addressed today had to do with specific issues around pain management and stress management as well as issues related to her underlying anxiety symptoms.  Impression/Diagnosis:   Holly Mcdonald is a 43 year old female referred by Dr. Posey Pronto for neuropsychological consultation and therapy.  The patient has been dealing with significant fibromyalgia and other chronic pain conditions and chronic medical conditions for nearly 2 years now.  The patient has been diagnosed with fibromyalgia, autoimmune disorder, migraines, fibroids, inflammatory arthritis, and connective tissue disease.  The patient reports that in June 2018 she had a pulmonary embolism and began having significant problems following that.  The patient reports that the pain began throughout her body shortly after this that is now been diagnosed as fibromyalgia and was diagnosed with fibromyalgia in August 2018.  The patient had been in what she describes as a "flareup" for 2 months.  The patient reports that she is also been seen by rheumatologist who diagnosed her with an autoimmune disorder in September 2019.  The patient has migraines but are well managed as well as recent surgeries for fibroids.  The patient is also had bladder issues as well as retinal hemorrhage in her eye.  The patient does report that she is hopeful that now they are starting to identify some of the issues that are going on she is still very stressed by both some of the medical issues that she is gone in and their severity as well as worried that the concerned that she may develop lupus or other progression in her autoimmune issues looms heavily over her.  The patient does report that she has had some anxiety and depression symptoms in response to some of her complex medical issues.   The patient was referred for therapeutic interventions to help better manage and cope with her current situation.  There is also been concern in the past about possible sleep apnea and the patient did have a sleep study years ago that did not show sleep apnea.  However, I do have some concerns that given changes with the patient over the past several years that sleep apnea may be a condition of concern.  I will talk with Dr. Posey Pronto about seeing if we can get a sleep study ordered as her health insurance company [employer] has a program where they will do an initial home sleep study that is available to the patient.  Diagnosis:   No diagnosis found.    Ilean Skill, Psy.D. Clinical Psychologist Neuropsychologist

## 2018-10-19 ENCOUNTER — Other Ambulatory Visit: Payer: Self-pay | Admitting: *Deleted

## 2018-10-19 DIAGNOSIS — J3089 Other allergic rhinitis: Secondary | ICD-10-CM

## 2018-10-19 MED ORDER — MONTELUKAST SODIUM 10 MG PO TABS: 10.0000 mg | ORAL_TABLET | Freq: Every day | ORAL | 1 refills | Status: DC

## 2018-10-19 NOTE — Telephone Encounter (Signed)
Courtesy refill sent in  

## 2018-10-20 NOTE — Progress Notes (Signed)
[name]   [phone] [phone]   Clinic Follow up Note   Patient Care Team: Martinique, Betty G, MD as PCP - General (Family Medicine)  Date of Service:  10/25/2018  CHIEF COMPLAINT: F/u on IDA and h/o of PE   CURRENT THERAPY:  Ferrous Sulfate 3 tablets daily, Eliquis 5 mg, baby aspirin.  INTERVAL HISTORY:  Holly Mcdonald is here for a follow up IDA. She presents to the clinic alone.  She notes she is having a flare of her fibromyalgia. She plans to go home and medicate after this visit. She does not have body aches yet. She has been on Eliquis. She continues to have no menorrhagia since IDU was placed. Overall lighter menses.    REVIEW OF SYSTEMS:   Constitutional: Denies fevers, chills or abnormal weight loss Eyes: Denies blurriness of vision Ears, nose, mouth, throat, and face: Denies mucositis or sore throat Respiratory: Denies cough, dyspnea or wheezes Cardiovascular: Denies palpitation, chest discomfort or lower extremity swelling Gastrointestinal:  Denies nausea, heartburn or change in bowel habits Skin: Denies abnormal skin rashes MSK: (+) Current fibromyalgia flare  Lymphatics: Denies new lymphadenopathy or easy bruising Neurological:Denies numbness, tingling or new weaknesses Behavioral/Psych: Mood is stable, no new changes  All other systems were reviewed with the patient and are negative.  MEDICAL HISTORY:  Past Medical History:  Diagnosis Date  . Anticoagulated    eliquis  . Arthritis    inflammatory  . Bladder spasm   . Connective tissue disease ([place]) dx oct 2019  . Fibromyalgia   . GERD (gastroesophageal reflux disease)   . Heavy menstrual bleeding    resolved with megace  . History of chronic bronchitis    last flare up 11-18-17  . History of pulmonary embolism 10/2016--- followed by pcp and pulmoloigst   dx bilateral PE , bilateral lower lobes , segmental , unprovoked, negative hypercoagulable work-up--  treated w/ eliquis  . Immune deficiency disorder ([place])   . Iron deficiency anemia   . Migraine   . Mild intermittent asthma    pulmologist-  dr Elsworth Soho  . Mixed connective tissue disease ([place])   . Morbid obesity with BMI of 45.0-49.9, adult ([place])   . Open-angle glaucoma of both eyes   . Pre-diabetes   . 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 Urgency of urination   . Uterine fibroid   . Vitamin D deficiency   . Wears glasses     SURGICAL HISTORY: Past Surgical History:  Procedure Laterality Date  . ANAL EXAMINATION UNDER ANESTHESIA  age 46   w/ removal cyst  . DILATATION & CURETTAGE/HYSTEROSCOPY WITH MYOSURE N/A 10/17/2017   Procedure: [place];  Surgeon: Anastasio Auerbach, MD;  Location: [facility];  Service: Gynecology;  Laterality: N/A;  . DILATATION & CURETTAGE/HYSTEROSCOPY WITH MYOSURE N/A 04/11/2018   Procedure: DILATATION & CURETTAGE/HYSTEROSCOPY WITH MYOSURE;  Surgeon: Anastasio Auerbach, MD;  Location: [facility];  Service: Gynecology;  Laterality: N/A;  . GYNECOLOGIC CRYOSURGERY  age 71s  . INTRAUTERINE DEVICE INSERTION  04/18/2018  . TONSILLECTOMY  age 42  . TRANSTHORACIC ECHOCARDIOGRAM  01/17/2017   ef 60-65%/  mild TR    I have reviewed the social history and family history with the patient and they are unchanged from previous note.  ALLERGIES:  is allergic to other and darvon [propoxyphene].  MEDICATIONS:  Current Outpatient Medications  Medication Sig Dispense Refill  .  ADVAIR DISKUS 250-50 MCG/DOSE AEPB INHALE 1 PUFF INTO THE LUNGS TWICE DAILY AS NEEDED 60 each 0  . albuterol (PROVENTIL) (2.5 MG/3ML) 0.083% nebulizer solution Take 3 mLs (2.5 mg total) by nebulization every 6 (six) hours as needed for wheezing or shortness of breath. 360 mL 5  . albuterol (VENTOLIN HFA) 108 (90 Base) MCG/ACT inhaler Inhale 2 puffs into the lungs every 6 (six) hours as needed. 18 g 0   . baclofen (LIORESAL) 10 MG tablet TAKE 1 TABLET BY MOUTH THREE TIMES DAILY AS NEEDED FOR MUSCLE SPASMS 90 tablet 5  . benzonatate (TESSALON) 200 MG capsule Take 1 capsule (200 mg total) by mouth 3 (three) times daily as needed for cough. 30 capsule 1  . cetirizine (ZYRTEC) 10 MG tablet Take 1 tablet (10 mg total) by mouth every morning. 30 tablet 5  . Cyanocobalamin (VITAMIN B 12 PO) Take 1,000 mg by mouth daily.     . DULoxetine (CYMBALTA) 60 MG capsule Take 1 capsule (60 mg total) by mouth daily. 30 capsule 2  . ELIQUIS 5 MG TABS tablet TAKE 1 TABLET(5 MG) BY MOUTH TWICE DAILY 60 tablet 5  . FEROSUL 325 (65 Fe) MG tablet TAKE 3 TABLETS(975 MG) BY MOUTH EVERY MORNING 90 tablet 5  . fluticasone (FLONASE) 50 MCG/ACT nasal spray SHAKE LIQUID AND USE 1 SPRAY IN EACH NOSTRIL DAILY--  in am 16 g 2  . hydroxychloroquine (PLAQUENIL) 200 MG tablet Take 200 mg by mouth daily.    . megestrol (MEGACE) 20 MG tablet Take one tablet by mouth twice daily for 7 days then daily as needed for bleeding 30 tablet 0  . methocarbamol (ROBAXIN) 500 MG tablet Take 1 tablet (500 mg total) by mouth 2 (two) times daily as needed for muscle spasms. 60 tablet 1  . montelukast (SINGULAIR) 10 MG tablet Take 1 tablet (10 mg total) by mouth at bedtime. 30 tablet 1  . omeprazole (PRILOSEC) 40 MG capsule TAKE 1 CAPSULE(40 MG) BY MOUTH DAILY 30 capsule 5  . oxybutynin (DITROPAN) 5 MG tablet Take 1 tablet by mouth 2 (two) times daily.  11  . PREDNISONE PO Take 5 mg by mouth.     . pregabalin (LYRICA) 75 MG capsule TAKE 1 CAPSULE(75 MG) BY MOUTH THREE TIMES DAILY 90 capsule 1  . timolol (TIMOPTIC) 0.5 % ophthalmic solution Place 1 drop into both eyes every morning.    . topiramate (TOPAMAX) 25 MG tablet TK 3 TS PO D----- per pt takes 3 tablets qhs  1  . Vitamin D, Ergocalciferol, (DRISDOL) 50000 units CAPS capsule TAKE 1 CAPSULE BY MOUTH every 10 days. 10 capsule 1   No current facility-administered medications for this visit.      PHYSICAL EXAMINATION: ECOG PERFORMANCE STATUS: 0 - Asymptomatic  Vitals:   10/25/18 1134  BP: (!) 107/47  Pulse: 83  Resp: 18  Temp: 98.3 F (36.8 C)  SpO2: 100%   Filed Weights   10/25/18 1134  Weight: (!) 335 lb 6.4 oz (152.1 kg)    GENERAL:alert, no distress and comfortable SKIN: skin color, texture, turgor are normal, no rashes or significant lesions EYES: normal, Conjunctiva are pink and non-injected, sclera clear  NECK: supple, thyroid normal size, non-tender, without nodularity LYMPH:  no palpable lymphadenopathy in the cervical, axillary  LUNGS: clear to auscultation and percussion with normal breathing effort HEART: regular rate & rhythm and no murmurs and no lower extremity edema ABDOMEN:abdomen soft, non-tender and normal bowel sounds Musculoskeletal:no cyanosis of digits  and no clubbing  NEURO: alert & oriented x 3 with fluent speech, no focal motor/sensory deficits  LABORATORY DATA:  I have reviewed the data as listed CBC Latest Ref Rng & Units 10/25/2018 07/26/2018 06/27/2018  WBC 4.0 - 10.5 K/uL 6.8 6.6 7.8  Hemoglobin 12.0 - 15.0 g/dL 13.9 13.5 13.7  Hematocrit 36.0 - 46.0 % 41.7 41.4 42.3  Platelets 150 - 400 K/uL 227 228 219     CMP Latest Ref Rng & Units 10/25/2018 06/27/2018 05/08/2018  Glucose 70 - 99 mg/dL 107(H) 110(H) 96  BUN 6 - 20 mg/dL 8 15 14   Creatinine 0.44 - 1.00 mg/dL 0.80 0.84 0.84  Sodium 135 - 145 mmol/L 141 139 139  Potassium 3.5 - 5.1 mmol/L 3.9 3.7 4.2  Chloride 98 - 111 mmol/L 110 108 110  CO2 22 - 32 mmol/L 21(L) 21(L) 20  Calcium 8.9 - 10.3 mg/dL 9.3 9.5 9.6  Total Protein 6.5 - 8.1 g/dL 7.3 - -  Total Bilirubin 0.3 - 1.2 mg/dL 0.4 - -  Alkaline Phos 38 - 126 U/L 62 - -  AST 15 - 41 U/L 13(L) - -  ALT 0 - 44 U/L 10 - -      RADIOGRAPHIC STUDIES: I have personally reviewed the radiological images as listed and agreed with the findings in the report. No results found.   ASSESSMENT & PLAN:  Holly Lendora Keys is a  43 y.o. female with   1. Unprovoked Pulmonary Embolism in 10/2016 -hypercoagulability work up was negative when she was diagnosed with PE. -Due to her family history of thrombosis in her mother, unprovoked PE, and her obesity, she is at high risk for recurrent thrombosis.  I recommend continue anticoagulation indefinitely if there is no contraindication. -Continue Eliquis 5 mg q12h, and baby aspirin. -She was recently diagnosed with an autoimmune disorder, for which she takes Plaquenil. I educated her that this medication can lower her blood counts slightly. -Labs reviewed, CBC and CMP WNL except, BG 107. -She has no sign of abnormal bleeding.  -Will continue Eliquis   2. Iron deficient anemia, secondary to her menorrhagia -She is on oral iron TID.  -She had an IUD Mirena placed in 04/2018 and now has lighter menses. She understands no estrogen-containing birth control pill or IUD, her risk of thrombosis. -Anemia resolved and iron normal today (10/25/18). I will set up IV iron if Ferritin low   3. Anxiety and Fibromyalgia  -f/u with Dr. Martinique -She is currently having a fibromyalgia flare which she is not have often. She plans to go home and medicate   4. Obesity -I encourage her to exercise to lose weight and prevent PE recurrence   Plan  -Labs reviewed, will set up IV iron if ferritin <30 ir low iron  -Continue oral iron TID  -Continue Eliquis  -Lab and f/u in 6 month with lab in 3 months     No problem-specific Assessment & Plan notes found for this encounter.   No orders of the defined types were placed in this encounter.  All questions were answered. The patient knows to call the clinic with any problems, questions or concerns. No barriers to learning was detected. I spent 10 minutes counseling the patient face to face. The total time spent in the appointment was 15 minutes and more than 50% was on counseling and review of test results     Truitt Merle, MD 10/25/2018    I, Joslyn Devon, am acting as scribe for  Truitt Merle, MD.   I have reviewed the above documentation for accuracy and completeness, and I agree with the above.

## 2018-10-25 ENCOUNTER — Other Ambulatory Visit: Payer: Self-pay

## 2018-10-25 ENCOUNTER — Inpatient Hospital Stay: Payer: 59 | Attending: Hematology

## 2018-10-25 ENCOUNTER — Inpatient Hospital Stay (HOSPITAL_BASED_OUTPATIENT_CLINIC_OR_DEPARTMENT_OTHER): Payer: 59 | Admitting: Hematology

## 2018-10-25 VITALS — BP 107/47 | HR 83 | Temp 98.3°F | Resp 18 | Ht 70.0 in | Wt 335.4 lb

## 2018-10-25 DIAGNOSIS — E559 Vitamin D deficiency, unspecified: Secondary | ICD-10-CM | POA: Diagnosis not present

## 2018-10-25 DIAGNOSIS — Z7982 Long term (current) use of aspirin: Secondary | ICD-10-CM | POA: Diagnosis not present

## 2018-10-25 DIAGNOSIS — M797 Fibromyalgia: Secondary | ICD-10-CM | POA: Insufficient documentation

## 2018-10-25 DIAGNOSIS — Z7901 Long term (current) use of anticoagulants: Secondary | ICD-10-CM | POA: Insufficient documentation

## 2018-10-25 DIAGNOSIS — K219 Gastro-esophageal reflux disease without esophagitis: Secondary | ICD-10-CM

## 2018-10-25 DIAGNOSIS — J45909 Unspecified asthma, uncomplicated: Secondary | ICD-10-CM | POA: Insufficient documentation

## 2018-10-25 DIAGNOSIS — Z79899 Other long term (current) drug therapy: Secondary | ICD-10-CM | POA: Diagnosis not present

## 2018-10-25 DIAGNOSIS — M351 Other overlap syndromes: Secondary | ICD-10-CM | POA: Insufficient documentation

## 2018-10-25 DIAGNOSIS — N920 Excessive and frequent menstruation with regular cycle: Secondary | ICD-10-CM | POA: Diagnosis not present

## 2018-10-25 DIAGNOSIS — D500 Iron deficiency anemia secondary to blood loss (chronic): Secondary | ICD-10-CM

## 2018-10-25 DIAGNOSIS — M129 Arthropathy, unspecified: Secondary | ICD-10-CM

## 2018-10-25 DIAGNOSIS — Z86718 Personal history of other venous thrombosis and embolism: Secondary | ICD-10-CM

## 2018-10-25 LAB — COMPREHENSIVE METABOLIC PANEL
ALT: 10 U/L (ref 0–44)
AST: 13 U/L — ABNORMAL LOW (ref 15–41)
Albumin: 4.2 g/dL (ref 3.5–5.0)
Alkaline Phosphatase: 62 U/L (ref 38–126)
Anion gap: 10 (ref 5–15)
BUN: 8 mg/dL (ref 6–20)
CO2: 21 mmol/L — ABNORMAL LOW (ref 22–32)
Calcium: 9.3 mg/dL (ref 8.9–10.3)
Chloride: 110 mmol/L (ref 98–111)
Creatinine, Ser: 0.8 mg/dL (ref 0.44–1.00)
GFR calc Af Amer: >60 mL/min (ref >60)
GFR calc non Af Amer: >60 mL/min (ref >60)
Glucose, Bld: 107 mg/dL — ABNORMAL HIGH (ref 70–99)
Potassium: 3.9 mmol/L (ref 3.5–5.1)
Sodium: 141 mmol/L (ref 135–145)
Total Bilirubin: 0.4 mg/dL (ref 0.3–1.2)
Total Protein: 7.3 g/dL (ref 6.5–8.1)

## 2018-10-25 LAB — IRON AND TIBC
Iron: 83 ug/dL (ref 41–142)
Iron: 83 ug/dL (ref 41–142)
Saturation Ratios: 26 % (ref 21–57)
Saturation Ratios: 26 % (ref 21–57)
TIBC: 324 ug/dL (ref 236–444)
TIBC: 324 ug/dL (ref 236–444)
UIBC: 240 ug/dL (ref 120–384)
UIBC: 240 ug/dL (ref 120–384)

## 2018-10-25 LAB — CBC WITH DIFFERENTIAL/PLATELET
Abs Immature Granulocytes: 0.01 10*3/uL (ref 0.00–0.07)
Basophils Absolute: 0 10*3/uL (ref 0.0–0.1)
Basophils Relative: 1 %
Eosinophils Absolute: 0 10*3/uL (ref 0.0–0.5)
Eosinophils Relative: 1 %
HCT: 41.7 % (ref 36.0–46.0)
Hemoglobin: 13.9 g/dL (ref 12.0–15.0)
Immature Granulocytes: 0 %
Lymphocytes Relative: 32 %
Lymphs Abs: 2.1 10*3/uL (ref 0.7–4.0)
MCH: 29.9 pg (ref 26.0–34.0)
MCHC: 33.3 g/dL (ref 30.0–36.0)
MCV: 89.7 fL (ref 80.0–100.0)
Monocytes Absolute: 0.5 10*3/uL (ref 0.1–1.0)
Monocytes Relative: 7 %
Neutro Abs: 4.1 10*3/uL (ref 1.7–7.7)
Neutrophils Relative %: 59 %
Platelets: 227 10*3/uL (ref 150–400)
RBC: 4.65 MIL/uL (ref 3.87–5.11)
RDW: 12.6 % (ref 11.5–15.5)
WBC: 6.8 10*3/uL (ref 4.0–10.5)
nRBC: 0 % (ref 0.0–0.2)

## 2018-10-26 ENCOUNTER — Telehealth: Payer: Self-pay | Admitting: Hematology

## 2018-10-26 ENCOUNTER — Encounter: Payer: Self-pay | Admitting: Hematology

## 2018-10-26 DIAGNOSIS — D500 Iron deficiency anemia secondary to blood loss (chronic): Secondary | ICD-10-CM | POA: Diagnosis not present

## 2018-10-26 LAB — FERRITIN: Ferritin: 118 ng/mL (ref 11–307)

## 2018-10-26 NOTE — Telephone Encounter (Signed)
Scheduled appt per 6/17 los. Spoke with patient and she is aware of her appt date and time.  We scheduled her lab and f/u early morning due to her trying to get her work schedule fixed.  She might call to reschedule them for a later time.

## 2018-10-27 ENCOUNTER — Telehealth: Payer: Self-pay

## 2018-10-27 NOTE — Telephone Encounter (Signed)
Spoke with pt advised per Dr. Burr Medico iron level is good, no need for iv iron right now. Pt verbalized understanding

## 2018-10-27 NOTE — Telephone Encounter (Signed)
-----   Message from Truitt Merle, MD sent at 10/27/2018  7:37 AM EDT ----- Please let pt know her lab results, iron level good, no need iv iron for now, thanks   Truitt Merle  10/27/2018

## 2018-10-30 ENCOUNTER — Other Ambulatory Visit: Payer: Self-pay | Admitting: *Deleted

## 2018-10-30 DIAGNOSIS — J4530 Mild persistent asthma, uncomplicated: Secondary | ICD-10-CM

## 2018-10-30 MED ORDER — ADVAIR DISKUS 250-50 MCG/DOSE IN AEPB: INHALATION_SPRAY | RESPIRATORY_TRACT | 0 refills | Status: DC

## 2018-11-01 ENCOUNTER — Ambulatory Visit: Payer: 59 | Admitting: Pulmonary Disease

## 2018-11-02 ENCOUNTER — Ambulatory Visit: Payer: 59 | Admitting: Psychology

## 2018-11-06 ENCOUNTER — Other Ambulatory Visit: Payer: Self-pay | Admitting: Physical Medicine & Rehabilitation

## 2018-11-07 ENCOUNTER — Other Ambulatory Visit: Payer: Self-pay | Admitting: Family Medicine

## 2018-11-07 ENCOUNTER — Other Ambulatory Visit: Payer: Self-pay | Admitting: Physical Medicine & Rehabilitation

## 2018-11-07 DIAGNOSIS — E559 Vitamin D deficiency, unspecified: Secondary | ICD-10-CM

## 2018-11-07 DIAGNOSIS — K219 Gastro-esophageal reflux disease without esophagitis: Secondary | ICD-10-CM

## 2018-11-09 ENCOUNTER — Other Ambulatory Visit: Payer: Self-pay

## 2018-11-09 ENCOUNTER — Telehealth: Payer: Self-pay | Admitting: *Deleted

## 2018-11-09 ENCOUNTER — Encounter: Payer: 59 | Attending: Physical Medicine & Rehabilitation | Admitting: Psychology

## 2018-11-09 ENCOUNTER — Ambulatory Visit: Payer: 59 | Admitting: Allergy

## 2018-11-09 DIAGNOSIS — G43909 Migraine, unspecified, not intractable, without status migrainosus: Secondary | ICD-10-CM | POA: Diagnosis not present

## 2018-11-09 DIAGNOSIS — H42 Glaucoma in diseases classified elsewhere: Secondary | ICD-10-CM | POA: Insufficient documentation

## 2018-11-09 DIAGNOSIS — R269 Unspecified abnormalities of gait and mobility: Secondary | ICD-10-CM | POA: Insufficient documentation

## 2018-11-09 DIAGNOSIS — M545 Low back pain: Secondary | ICD-10-CM | POA: Insufficient documentation

## 2018-11-09 DIAGNOSIS — F064 Anxiety disorder due to known physiological condition: Secondary | ICD-10-CM

## 2018-11-09 DIAGNOSIS — G894 Chronic pain syndrome: Secondary | ICD-10-CM

## 2018-11-09 DIAGNOSIS — D219 Benign neoplasm of connective and other soft tissue, unspecified: Secondary | ICD-10-CM | POA: Insufficient documentation

## 2018-11-09 DIAGNOSIS — E1139 Type 2 diabetes mellitus with other diabetic ophthalmic complication: Secondary | ICD-10-CM | POA: Diagnosis not present

## 2018-11-09 DIAGNOSIS — J449 Chronic obstructive pulmonary disease, unspecified: Secondary | ICD-10-CM | POA: Diagnosis not present

## 2018-11-09 DIAGNOSIS — Z6841 Body Mass Index (BMI) 40.0 and over, adult: Secondary | ICD-10-CM | POA: Diagnosis not present

## 2018-11-09 DIAGNOSIS — G479 Sleep disorder, unspecified: Secondary | ICD-10-CM | POA: Insufficient documentation

## 2018-11-09 DIAGNOSIS — R251 Tremor, unspecified: Secondary | ICD-10-CM | POA: Diagnosis not present

## 2018-11-09 DIAGNOSIS — M797 Fibromyalgia: Secondary | ICD-10-CM | POA: Insufficient documentation

## 2018-11-09 DIAGNOSIS — Z86711 Personal history of pulmonary embolism: Secondary | ICD-10-CM | POA: Diagnosis not present

## 2018-11-09 DIAGNOSIS — F419 Anxiety disorder, unspecified: Secondary | ICD-10-CM | POA: Diagnosis not present

## 2018-11-09 NOTE — Telephone Encounter (Signed)
Per Dr Posey Pronto, I have sent an inbox message to primary care MD requesting that they place the order for the sleep study requested by Dr Sima Matas.

## 2018-11-14 ENCOUNTER — Encounter: Payer: Self-pay | Admitting: Psychology

## 2018-11-14 NOTE — Progress Notes (Signed)
Neuropsychology Visit  Patient:  Holly Mcdonald   DOB: Sep 27, 1975  MR Number: [id number]  Location: [facility] [place], [place] [id number] [place] [address] Dept: [phone]  Date of Service: Seven 06/2018  Start: 8 AM End: 9 AM  Today's visit was an in person visit conducted in my office in the outpatient clinic office with myself and the patient present.  Duration of Service: 1 hour  Provider/Observer:     Edgardo Roys PsyD  Chief Complaint:      Chief Complaint  Patient presents with  . Pain  . Anxiety  . Stress    Reason For Service:      Holly Mcdonald is a 43 year old female referred by Dr. Posey Pronto for neuropsychological consultation and therapy.  The patient has been dealing with significant fibromyalgia and other chronic pain conditions and chronic medical conditions for nearly 2 years now.  The patient has been diagnosed with fibromyalgia, autoimmune disorder, migraines, fibroids, inflammatory arthritis, and connective tissue disease.  The patient reports that in June 2018 she had a pulmonary embolism and began having significant problems following that.  The patient reports that the pain began throughout her body shortly after this that is now been diagnosed as fibromyalgia and was diagnosed with fibromyalgia in August 2018.  The patient had been in what she describes as a "flareup" for 2 months.  The patient reports that she is also been seen by rheumatologist who diagnosed her with an autoimmune disorder in September 2019.  The patient has migraines but are well managed as well as recent surgeries for fibroids.  The patient is also had bladder issues as well as retinal hemorrhage in her eye.  The patient does report that she is hopeful that now they are starting to identify some of the issues that are going on she is still very stressed by  both some of the medical issues that she is gone in and their severity as well as worried that the concerned that she may develop lupus or other progression in her autoimmune issues looms heavily over her.   The above reason for service was reviewed for this visit and remains applicable for the current visit.  The patient continues to describe significant pain in her right shoulder and other regions including her neck area.  The patient reports that she has continued to improve her sleep to some degree but it continues to be problematic.  The patient is working situation/environment has changed and she is now working from home rather than going into the office.  We have continue to work on getting the patient scheduled for a sleep study.   Interventions:  Cognitive/behavioral therapeutic interventions and working on coping and therapeutic interventions.  Participation Level:   Active  Participation Quality:  Appropriate and Attentive      Behavioral Observation:  Well Groomed, Alert, and Appropriate.   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  The patient has some concerns about COVID-19 which are appropriate given her medical status and breathing issues.  Content of Session:   Reviewed current symptoms and continue to work on therapeutic interventions around coping and adjustment issues including anxiety, chronic pain, and fibromyalgia type symptoms including sleep disturbance.  Effectiveness of Interventions: Today's visit was an in person visit and we continue to work  on therapeutic interventions.  The patient remains highly motivated and active in the sessions and she is experiencing improvements in her overall symptoms but she continues to have significant pain and inflammation issues.  Target Goals:   Target goals include working on building better coping skills around stress and  anxiety and working on specific chronic pain management issues and goals for fibromyalgia type  Goals Last Reviewed:   11/09/2018  Goals Addressed Today:    Goals addressed today had to do with specific issues around pain management and stress management as well as issues related to her underlying anxiety symptoms.  Impression/Diagnosis:   Holly Mcdonald is a 43 year old female referred by Dr. Posey Pronto for neuropsychological consultation and therapy.  The patient has been dealing with significant fibromyalgia and other chronic pain conditions and chronic medical conditions for nearly 2 years now.  The patient has been diagnosed with fibromyalgia, autoimmune disorder, migraines, fibroids, inflammatory arthritis, and connective tissue disease.  The patient reports that in June 2018 she had a pulmonary embolism and began having significant problems following that.  The patient reports that the pain began throughout her body shortly after this that is now been diagnosed as fibromyalgia and was diagnosed with fibromyalgia in August 2018.  The patient had been in what she describes as a "flareup" for 2 months.  The patient reports that she is also been seen by rheumatologist who diagnosed her with an autoimmune disorder in September 2019.  The patient has migraines but are well managed as well as recent surgeries for fibroids.  The patient is also had bladder issues as well as retinal hemorrhage in her eye.  The patient does report that she is hopeful that now they are starting to identify some of the issues that are going on she is still very stressed by both some of the medical issues that she is gone in and their severity as well as worried that the concerned that she may develop lupus or other progression in her autoimmune issues looms heavily over her.  The patient does report that she has had some anxiety and depression symptoms in response to some of her complex medical issues.  The patient was referred  for therapeutic interventions to help better manage and cope with her current situation.  There is also been concern in the past about possible sleep apnea and the patient did have a sleep study years ago that did not show sleep apnea.  However, I do have some concerns that given changes with the patient over the past several years that sleep apnea may be a condition of concern.  I will talk with Dr. Posey Pronto about seeing if we can get a sleep study ordered as her health insurance company [employer] has a program where they will do an initial home sleep study that is available to the patient.  I checked with Dr. Serita Grit nurse practitioner and they are following up on the referral for a sleep study.  Diagnosis:  Fibromyalgia, chronic pain and anxiety disorder.    Ilean Skill, Psy.D. Clinical Psychologist Neuropsychologist

## 2018-11-30 ENCOUNTER — Telehealth: Payer: Self-pay | Admitting: Physical Medicine & Rehabilitation

## 2018-11-30 NOTE — Telephone Encounter (Signed)
[facility] called to let us know that patients insurance denied sleep study.  They did say that they could do a in lab sleep study, if that is what he wants please call them at [phone].

## 2018-12-02 ENCOUNTER — Other Ambulatory Visit: Payer: Self-pay | Admitting: Physical Medicine & Rehabilitation

## 2018-12-03 NOTE — Telephone Encounter (Signed)
We can let her know to discuss with her PCP.  Thanks.

## 2018-12-04 ENCOUNTER — Telehealth: Payer: Self-pay

## 2018-12-04 ENCOUNTER — Telehealth: Payer: Self-pay | Admitting: Podiatry

## 2018-12-04 NOTE — Telephone Encounter (Signed)
I have left a message for Holly Mcdonald that she will need to contact her Pulmonologist about ordering a sleep study, since her primary did not want to be the one to order the test.

## 2018-12-04 NOTE — Telephone Encounter (Signed)
Can you see what she is on eliquis for and who prescribed it. Can you call over to see if we can stop it for 2 days?

## 2018-12-04 NOTE — Telephone Encounter (Signed)
Left message informing pt of Dr. Leigh [name] orders and that I would call her doctor to check for recommendation of stopping the Eliquis.

## 2018-12-04 NOTE — Telephone Encounter (Signed)
She needs to follow up with PCP, I do not order home sleep studies.  Thanks.

## 2018-12-04 NOTE — Telephone Encounter (Signed)
[facility] - Dr. Ernestina Penna office-[place] states Dr. Burr Medico said pt may come off the Eliquis 1-2 days prior to the procedure and begin the following day.

## 2018-12-04 NOTE — Telephone Encounter (Signed)
Patient is scheduled for a minor toenail procedure on Thursday 7/30.  She is currently on Eliquis and they want to know if she can come off of it prior to her procedure.  Call back Mateo Flow at [facility] [phone]

## 2018-12-04 NOTE — Telephone Encounter (Signed)
Pt is scheduled for ingrown toenail procedures on 12/07/18 and takes blood thinners. Pt would like to know if she needs to stop taking them a day or two before the procedures. Please give patient a call.

## 2018-12-04 NOTE — Telephone Encounter (Signed)
Pt called states she was placed on the Eliquis for a pulmonary embolis 11/02/2016, and she has had procedures previously and was able to come off of it for a day or 2.

## 2018-12-04 NOTE — Telephone Encounter (Signed)
[facility] - Dr. Ernestina Penna office-[place] states it should not be a problem but she will call back with recommendation.

## 2018-12-04 NOTE — Telephone Encounter (Signed)
Patient called stating that sleep study order needs to be for home sleep study. She works for [employer] and knows that no authorization is required for home sleep study. Aware other was denied.

## 2018-12-04 NOTE — Telephone Encounter (Signed)
I left her a VM earlier telling her she needs to get her pulmonologist to order. Her PCP did not want to order it either.  She has appt with you 12/14/18

## 2018-12-04 NOTE — Telephone Encounter (Signed)
It is difficult to evaluate since I have not seen the patient. However if she wants to have the ingrown done then stop 2 days before her appointment. Thanks.

## 2018-12-04 NOTE — Telephone Encounter (Signed)
Per Dr. Burr Medico okay to stop Eliquis one to two days prior to procedure and restart the day after.  Left voice message.

## 2018-12-05 NOTE — Telephone Encounter (Signed)
Thanks

## 2018-12-05 NOTE — Telephone Encounter (Signed)
Left message informing pt of Dr. Leigh [name] orders of 12/04/2018 5:02pm and to call confirm receipt of the message.

## 2018-12-05 NOTE — Telephone Encounter (Signed)
Pt called and stated she had received my call yesterday and understood.

## 2018-12-07 ENCOUNTER — Ambulatory Visit (INDEPENDENT_AMBULATORY_CARE_PROVIDER_SITE_OTHER): Payer: 59 | Admitting: Podiatry

## 2018-12-07 ENCOUNTER — Other Ambulatory Visit: Payer: Self-pay

## 2018-12-07 ENCOUNTER — Encounter: Payer: Self-pay | Admitting: Podiatry

## 2018-12-07 VITALS — Temp 98.0°F

## 2018-12-07 DIAGNOSIS — B351 Tinea unguium: Secondary | ICD-10-CM

## 2018-12-07 DIAGNOSIS — M79674 Pain in right toe(s): Secondary | ICD-10-CM

## 2018-12-07 DIAGNOSIS — M79675 Pain in left toe(s): Secondary | ICD-10-CM

## 2018-12-07 DIAGNOSIS — J320 Chronic maxillary sinusitis: Secondary | ICD-10-CM | POA: Insufficient documentation

## 2018-12-07 DIAGNOSIS — L600 Ingrowing nail: Secondary | ICD-10-CM

## 2018-12-07 MED ORDER — ACETAMINOPHEN-CODEINE #3 300-30 MG PO TABS: 1.0000 | ORAL_TABLET | Freq: Four times a day (QID) | ORAL | 0 refills | Status: DC | PRN

## 2018-12-07 MED ORDER — CEPHALEXIN 500 MG PO CAPS: 500.0000 mg | ORAL_CAPSULE | Freq: Three times a day (TID) | ORAL | 0 refills | Status: DC

## 2018-12-07 NOTE — Progress Notes (Signed)
Subjective:   Patient ID: Holly Mcdonald, female   DOB: 43 y.o.   MRN: [id number]   HPI 43 year old female presents the office today for concerns of ingrown toenails of both great toes, both medial lateral nail borders.  This is been a chronic issue for her.  She states that they are painful with pressure in shoes and she was to have the nail corners removed.  Denies any drainage or pus.  She is currently on Eliquis however we have permission she has stopped this for last 2 days.  She is done a lot of research on the procedure and she was to have this performed.   Review of Systems  All other systems reviewed and are negative.  Past Medical History:  Diagnosis Date  . Anticoagulated    eliquis  . Arthritis    inflammatory  . Bladder spasm   . Connective tissue disease ([place]) dx oct 2019  . Fibromyalgia   . GERD (gastroesophageal reflux disease)   . Heavy menstrual bleeding    resolved with megace  . History of chronic bronchitis    last flare up 11-18-17  . History of pulmonary embolism 10/2016--- followed by pcp and pulmoloigst   dx bilateral PE , bilateral lower lobes , segmental , unprovoked, negative hypercoagulable work-up-- treated w/ eliquis  . Immune deficiency disorder ([place])   . Iron deficiency anemia   . Migraine   . Mild intermittent asthma    pulmologist-  dr Elsworth Soho  . Mixed connective tissue disease ([place])   . Morbid obesity with BMI of 45.0-49.9, adult ([place])   . Open-angle glaucoma of both eyes   . Pre-diabetes   . 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 Urgency of urination   . Uterine fibroid   . Vitamin D deficiency   . Wears glasses     Past Surgical History:  Procedure Laterality Date  . ANAL EXAMINATION UNDER ANESTHESIA  age 7   w/ removal cyst  . DILATATION & CURETTAGE/HYSTEROSCOPY WITH MYOSURE N/A 10/17/2017   Procedure: [place];  Surgeon: Anastasio Auerbach, MD;  Location:  [place];  Service: Gynecology;  Laterality: N/A;  . DILATATION & CURETTAGE/HYSTEROSCOPY WITH MYOSURE N/A 04/11/2018   Procedure: DILATATION & CURETTAGE/HYSTEROSCOPY WITH MYOSURE;  Surgeon: Anastasio Auerbach, MD;  Location: [place];  Service: Gynecology;  Laterality: N/A;  . GYNECOLOGIC CRYOSURGERY  age 41s  . INTRAUTERINE DEVICE INSERTION  04/18/2018  . TONSILLECTOMY  age 45  . TRANSTHORACIC ECHOCARDIOGRAM  01/17/2017   ef 60-65%/  mild TR     Current Outpatient Medications:  .  acetaminophen-codeine (TYLENOL #3) 300-30 MG tablet, Take 1 tablet by mouth every 6 (six) hours as needed for moderate pain., Disp: 10 tablet, Rfl: 0 .  ADVAIR DISKUS 250-50 MCG/DOSE AEPB, INHALE 1 PUFF INTO THE LUNGS TWICE DAILY AS NEEDED, Disp: 60 each, Rfl: 0 .  albuterol (PROVENTIL) (2.5 MG/3ML) 0.083% nebulizer solution, Take 3 mLs (2.5 mg total) by nebulization every 6 (six) hours as needed for wheezing or shortness of breath., Disp: 360 mL, Rfl: 5 .  albuterol (VENTOLIN HFA) 108 (90 Base) MCG/ACT inhaler, Inhale 2 puffs into the lungs every 6 (six) hours as needed., Disp: 18 g, Rfl: 0 .  baclofen (LIORESAL) 10 MG tablet, TAKE 1 TABLET BY MOUTH THREE TIMES DAILY AS NEEDED FOR MUSCLE SPASMS, Disp: 90 tablet, Rfl: 5 .  benzonatate (TESSALON) 200 MG capsule, Take 1 capsule (  200 mg total) by mouth 3 (three) times daily as needed for cough., Disp: 30 capsule, Rfl: 1 .  cephALEXin (KEFLEX) 500 MG capsule, Take 1 capsule (500 mg total) by mouth 3 (three) times daily., Disp: 21 capsule, Rfl: 0 .  cetirizine (ZYRTEC) 10 MG tablet, Take 1 tablet (10 mg total) by mouth every morning., Disp: 30 tablet, Rfl: 5 .  Cyanocobalamin (VITAMIN B 12 PO), Take 1,000 mg by mouth daily. , Disp: , Rfl:  .  DULoxetine (CYMBALTA) 60 MG capsule, TAKE 1 CAPSULE(60 MG) BY MOUTH DAILY, Disp: 30 capsule, Rfl: 2 .  ELIQUIS 5 MG TABS tablet, TAKE 1 TABLET(5 MG) BY MOUTH TWICE DAILY, Disp: 60 tablet, Rfl: 5 .   FEROSUL 325 (65 Fe) MG tablet, TAKE 3 TABLETS(975 MG) BY MOUTH EVERY MORNING, Disp: 90 tablet, Rfl: 5 .  fluticasone (FLONASE) 50 MCG/ACT nasal spray, SHAKE LIQUID AND USE 1 SPRAY IN EACH NOSTRIL DAILY--  in am, Disp: 16 g, Rfl: 2 .  hydroxychloroquine (PLAQUENIL) 200 MG tablet, Take 200 mg by mouth daily., Disp: , Rfl:  .  megestrol (MEGACE) 20 MG tablet, Take one tablet by mouth twice daily for 7 days then daily as needed for bleeding, Disp: 30 tablet, Rfl: 0 .  methocarbamol (ROBAXIN) 500 MG tablet, Take 1 tablet (500 mg total) by mouth 2 (two) times daily as needed for muscle spasms., Disp: 60 tablet, Rfl: 1 .  montelukast (SINGULAIR) 10 MG tablet, Take 1 tablet (10 mg total) by mouth at bedtime., Disp: 30 tablet, Rfl: 1 .  omeprazole (PRILOSEC) 40 MG capsule, TAKE 1 CAPSULE(40 MG) BY MOUTH DAILY, Disp: 30 capsule, Rfl: 5 .  oxybutynin (DITROPAN) 5 MG tablet, Take 1 tablet by mouth 2 (two) times daily., Disp: , Rfl: 11 .  PREDNISONE PO, Take 5 mg by mouth. , Disp: , Rfl:  .  pregabalin (LYRICA) 75 MG capsule, TAKE ONE CAPSULE BY MOUTH THREE TIMES DAILY, Disp: 90 capsule, Rfl: 1 .  timolol (TIMOPTIC) 0.5 % ophthalmic solution, Place 1 drop into both eyes every morning., Disp: , Rfl:  .  topiramate (TOPAMAX) 25 MG tablet, TK 3 TS PO D----- per pt takes 3 tablets qhs, Disp: , Rfl: 1 .  Vitamin D, Ergocalciferol, (DRISDOL) 1.25 MG (50000 UT) CAPS capsule, TAKE 1 CAPSULE BY MOUTH EVERY 10 DAYS, Disp: 10 capsule, Rfl: 1  Allergies  Allergen Reactions  . Other Nausea And Vomiting    Darvocet N-100  . Darvon [Propoxyphene]          Objective:  Physical Exam  General: AAO x3, NAD  Dermatological: Chronic ingrown toenails are present to both the medial lateral nail borders of bilateral hallux.  There is tenderness palpation on nail borders.  No drainage or pus.  Minimal edema there is no erythema warmth or ascending cellulitis.  No open lesions.  Most of the right second third toenails are  hypertrophic, dystrophic.  Vascular: Dorsalis Pedis artery and Posterior Tibial artery pedal pulses are 2/4 bilateral with immedate capillary fill time.  There is no pain with calf compression, swelling, warmth, erythema.   Neruologic: Grossly intact via light touch bilateral. Vibratory intact via tuning fork bilateral. Protective threshold with Semmes Wienstein monofilament intact to all pedal sites bilateral.  Musculoskeletal: No gross boney pedal deformities bilateral. No pain, crepitus, or limitation noted with foot and ankle range of motion bilateral. Muscular strength 5/5 in all groups tested bilateral.  Gait: Unassisted, Nonantalgic.      Assessment:   Chronic  ingrown toenails bilateral hallux; onychomycosis     Plan:  -Treatment options discussed including all alternatives, risks, and complications -Etiology of symptoms were discussed -At this time, the patient is requesting partial nail removal with chemical matricectomy to the symptomatic portion of the nail. Risks and complications were discussed with the patient for which they understand and written consent was obtained. Under sterile conditions a total of 3 mL of a mixture of 2% lidocaine plain and 0.5% Marcaine plain was infiltrated in a hallux block fashion. Once anesthetized, the skin was prepped in sterile fashion. A tourniquet was then applied. Next the medial/lateral aspect of hallux nail border was then sharply excised making sure to remove the entire offending nail border. Once the nails were ensured to be removed area was debrided and the underlying skin was intact. There is no purulence identified in the procedure. Next phenol was then applied under standard conditions and copiously irrigated. Silvadene was applied. A dry sterile dressing was applied. After application of the dressing the tourniquet was removed and there is found to be an immediate capillary refill time to the digit. The patient tolerated the procedure well  any complications. Post procedure instructions were discussed the patient for which he verbally understood. Follow-up in one week for nail check or sooner if any problems are to arise. Discussed signs/symptoms of infection and directed to call the office immediately should any occur or go directly to the emergency room. In the meantime, encouraged to call the office with any questions, concerns, changes symptoms. -Keflex -Nails sent for culture to [facility].   Trula Slade DPM

## 2018-12-07 NOTE — Patient Instructions (Signed)

## 2018-12-14 ENCOUNTER — Other Ambulatory Visit: Payer: Self-pay

## 2018-12-14 ENCOUNTER — Ambulatory Visit (INDEPENDENT_AMBULATORY_CARE_PROVIDER_SITE_OTHER): Payer: 59

## 2018-12-14 ENCOUNTER — Encounter: Payer: Self-pay | Admitting: Physical Medicine & Rehabilitation

## 2018-12-14 ENCOUNTER — Encounter: Payer: 59 | Attending: Physical Medicine & Rehabilitation | Admitting: Physical Medicine & Rehabilitation

## 2018-12-14 VITALS — BP 116/82 | HR 92 | Temp 99.0°F | Ht 70.0 in | Wt 330.4 lb

## 2018-12-14 DIAGNOSIS — G894 Chronic pain syndrome: Secondary | ICD-10-CM | POA: Diagnosis not present

## 2018-12-14 DIAGNOSIS — J449 Chronic obstructive pulmonary disease, unspecified: Secondary | ICD-10-CM | POA: Insufficient documentation

## 2018-12-14 DIAGNOSIS — G479 Sleep disorder, unspecified: Secondary | ICD-10-CM

## 2018-12-14 DIAGNOSIS — E1139 Type 2 diabetes mellitus with other diabetic ophthalmic complication: Secondary | ICD-10-CM | POA: Diagnosis not present

## 2018-12-14 DIAGNOSIS — H42 Glaucoma in diseases classified elsewhere: Secondary | ICD-10-CM | POA: Insufficient documentation

## 2018-12-14 DIAGNOSIS — M545 Low back pain: Secondary | ICD-10-CM | POA: Insufficient documentation

## 2018-12-14 DIAGNOSIS — G43909 Migraine, unspecified, not intractable, without status migrainosus: Secondary | ICD-10-CM | POA: Insufficient documentation

## 2018-12-14 DIAGNOSIS — R269 Unspecified abnormalities of gait and mobility: Secondary | ICD-10-CM | POA: Diagnosis not present

## 2018-12-14 DIAGNOSIS — M797 Fibromyalgia: Secondary | ICD-10-CM

## 2018-12-14 DIAGNOSIS — M359 Systemic involvement of connective tissue, unspecified: Secondary | ICD-10-CM | POA: Diagnosis not present

## 2018-12-14 DIAGNOSIS — R251 Tremor, unspecified: Secondary | ICD-10-CM | POA: Diagnosis not present

## 2018-12-14 DIAGNOSIS — B351 Tinea unguium: Secondary | ICD-10-CM

## 2018-12-14 DIAGNOSIS — Z6841 Body Mass Index (BMI) 40.0 and over, adult: Secondary | ICD-10-CM | POA: Diagnosis not present

## 2018-12-14 DIAGNOSIS — D219 Benign neoplasm of connective and other soft tissue, unspecified: Secondary | ICD-10-CM | POA: Diagnosis not present

## 2018-12-14 DIAGNOSIS — F419 Anxiety disorder, unspecified: Secondary | ICD-10-CM | POA: Diagnosis not present

## 2018-12-14 DIAGNOSIS — G43009 Migraine without aura, not intractable, without status migrainosus: Secondary | ICD-10-CM

## 2018-12-14 DIAGNOSIS — Z86711 Personal history of pulmonary embolism: Secondary | ICD-10-CM | POA: Insufficient documentation

## 2018-12-14 NOTE — Progress Notes (Signed)
Subjective:    Patient ID: Holly Mcdonald, female    DOB: 1975-09-15, 43 y.o.   MRN: [id number]  HPI Female with past medical history of tremors, pulmonary embolism, morbid obesity, migraines, fibromyalgia, prediabetes, asthma, fibroids, chronic bronchitis presents with fibromyalgia.  Initially stated: Started 10/2016 after diagnosed PE.  Progressively getting worse.  Heat improved the pain.  Fibroids have exacerbated the pain, with surgery planned for 10/17/17.  Activity exacerbates the pain.  All qualities of pain.  Non-radiating.  Constant.  Associated muscle spasms and tingling.  Tylenol does not help.  Cymbalta provides some benefit. Denies falls. Pain limits all activities.   Works as Therapist, art.    Last clinic visit 08/16/2018.  Since that time several communication exchange regarding sleep apnea.  Discussed with neuropsychology as well.  Since that time, patient states she had b/l ingrain toe nail surgery on b/l 1st digits. She is awaiting Pulm appointment for sleep study. She continues to use TENS.  She forgot about using Lidoderm. Continues to use Lyrica.  Taking Baclofen ~1/week. She follows up with Psych. Has not really needed Robaxin. She continues Plaquenil.  She is taking Cymbalta as well.  Fall onto couch after getting up from couch (prior to toe surgery). Sleep is fair. States she lost 5 pounds. Migraines are controlled.   Pain Inventory Average Pain 3 Pain Right Now 2 My pain is intermittent, sharp, dull and aching  In the last 24 hours, has pain interfered with the following? General activity 0 Relation with others 0 Enjoyment of life 0 What TIME of day is your pain at its worst? all Sleep (in general) Good  Pain is worse with: walking, sitting, inactivity, standing and some activites Pain improves with: rest, heat/ice, therapy/exercise, pacing activities and medication Relief from Meds: 7  Mobility walk without assistance how many minutes can you walk?  15-20 ability to climb steps?  yes do you drive?  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  no  Physicians involved in your care Any changes since last visit?  yes Dr. Maren Beach   Family History  Problem Relation Age of Onset  . Pulmonary embolism Mother        Last year  . Mental retardation Mother        PTSD and fibromyalgia  . Heart disease Mother        CHF  . Congestive Heart Failure Mother   . Heart disease Father 65       CAD  . Hyperlipidemia Father   . Diabetes Paternal Grandmother   . Heart attack Paternal Grandmother   . Congestive Heart Failure Maternal Aunt   . Cancer Neg Hx    Social History   Socioeconomic History  . Marital status: Divorced    Spouse name: Not on file  . Number of children: Not on file  . Years of education: Not on file  . Highest education level: Not on file  Occupational History  . Occupation: representative    Comment: [employer]  Social Needs  . Financial resource strain: Not on file  . Food insecurity    Worry: Not on file    Inability: Not on file  . Transportation needs    Medical: Not on file    Non-medical: Not on file  Tobacco Use  . Smoking status: Never Smoker  . Smokeless tobacco: Never Used  Substance and Sexual Activity  . Alcohol use: Yes    Comment: occasionally  . Drug use: No  . Sexual activity: Yes    Birth control/protection: None, I.U.D.    Comment: 1st intercourse 43 yo-More than 5 partners Mirena 09/16/2017  Lifestyle  . Physical activity    Days per week: Not on file    Minutes per session: Not on file  . Stress: Not on file  Relationships  . Social Herbalist on phone: Not on file    Gets together: Not on file    Attends religious service: Not on file    Active member of club or organization: Not on file    Attends meetings of clubs or  organizations: Not on file    Relationship status: Not on file  Other Topics Concern  . Not on file  Social History Narrative  . Not on file   Past Surgical History:  Procedure Laterality Date  . ANAL EXAMINATION UNDER ANESTHESIA  age 61   w/ removal cyst  . DILATATION & CURETTAGE/HYSTEROSCOPY WITH MYOSURE N/A 10/17/2017   Procedure: [place];  Surgeon: Anastasio Auerbach, MD;  Location: [place];  Service: Gynecology;  Laterality: N/A;  . DILATATION & CURETTAGE/HYSTEROSCOPY WITH MYOSURE N/A 04/11/2018   Procedure: DILATATION & CURETTAGE/HYSTEROSCOPY WITH MYOSURE;  Surgeon: Anastasio Auerbach, MD;  Location: [facility] [place];  Service: Gynecology;  Laterality: N/A;  . GYNECOLOGIC CRYOSURGERY  age 35s  . INTRAUTERINE DEVICE INSERTION  04/18/2018  . TONSILLECTOMY  age 74  . TRANSTHORACIC ECHOCARDIOGRAM  01/17/2017   ef 60-65%/  mild TR   Past Medical History:  Diagnosis Date  . Anticoagulated    eliquis  . Arthritis    inflammatory  . Bladder spasm   . Connective tissue disease ([place]) dx oct 2019  . Fibromyalgia   . GERD (gastroesophageal reflux disease)   . Heavy menstrual bleeding    resolved with megace  . History of chronic bronchitis    last flare up 11-18-17  . History of pulmonary embolism 10/2016--- followed by pcp and pulmoloigst   dx bilateral PE , bilateral lower lobes , segmental , unprovoked, negative hypercoagulable work-up-- treated w/ eliquis  . Immune deficiency disorder ([place])   . Iron deficiency anemia   . Migraine   . Mild intermittent asthma    pulmologist-  dr Elsworth Soho  . Mixed connective tissue disease ([place])   . Morbid obesity with BMI of 45.0-49.9, adult ([place])   . Open-angle glaucoma of both eyes   . Pre-diabetes   . 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 Urgency of urination   . Uterine fibroid   . Vitamin D deficiency   . Wears glasses    BP 116/82   Pulse  92   Temp 99 F (37.2 C)   Ht 5\' 10"  (1.778 m)   Wt (!) 330 lb 6.4 oz (149.9 kg)   SpO2 93%   BMI 47.41 kg/m   Opioid Risk Score:   Fall Risk Score:  `1  Depression screen PHQ 2/9  Depression screen [facility] 2/9 05/17/2018 01/12/2018 10/07/2017  Decreased Interest 0 0 0  Down, Depressed, Hopeless 0 0 0  PHQ - 2 Score 0 0 0  Altered sleeping - - 1  Tired, decreased energy - - 3  Change in appetite - - 3  Feeling bad or failure about yourself  - - 0  Trouble concentrating - -  0  Moving slowly or fidgety/restless - - 0  Suicidal thoughts - - 0  PHQ-9 Score - - 7  Difficult doing work/chores - - Somewhat difficult  Some recent data might be hidden   Review of Systems  Constitutional: Negative.   HENT: Negative.   Eyes: Negative.   Respiratory: Negative.   Cardiovascular: Negative.   Gastrointestinal: Negative.   Endocrine: Negative.   Genitourinary: Positive for difficulty urinating.  Musculoskeletal: Positive for arthralgias, back pain, myalgias and neck pain.  Skin: Negative.   Allergic/Immunologic: Negative.   Neurological:       Tingling  Hematological: Negative.   Psychiatric/Behavioral: The patient is nervous/anxious.   All other systems reviewed and are negative.     Objective:   Physical Exam Constitutional: No distress . Vital signs reviewed. HENT: Normocephalic.  Atraumatic. Eyes: EOMI. No discharge. Cardiovascular: No JVD. Respiratory: Normal effort.  No stridor. GI: Non-distended. Skin: Warm and dry.  Intact. Psych: Normal mood.  Normal behavior. Musc: Gait mildly antalgic  + TTP with delayed hypersensitivity Neuro: Alert  Strength: 4/5 grossly in bilateral upper extremities (limited by pain inhibition/guarding)    Assessment & Plan:  Female with past medical history of tremors, pulmonary embolism, morbid obesity, migraines, fibromyalgia, prediabetes, asthma, fibroids, chronic bronchitis presents with pain all over.   1. Chronic diffuse pain   Multifactorial - Fibromyalgia, connective tissue disorder, inflammatory arthritis, fibroids  Side effects with Gabapentin  Neck xray reviewed, showing mild disc space narrowing at C7-T1.  Cont Heat, trial cold, reminded again  Encouraged pool therapy (completed aquatic therapy), reminded again after CoVid.  Cont TENS IT with benefit   Cont Lidoderm OTC, reminded again  Cont Lyrica 75 TID  Cont Baclofen 10 qhs, unable to tolerate during the day  D/c Robaxin 500 BID PRN  Cont to follow up with Psychology   Cont Cymbalta 60mg   Will consider accupuncture if necessary  Will consider NSAID if necessary  Patient follow up with Pulm for sleep study/PFTs   2. Gait abnormality  Mildly antalgic, cont slow transitional movements  3. Sleep disturbance  Cont meds  Relatively improved  4. Morbid Obesity  Cont weight loss  5. Myalgia   Will consider trigger point injections when able to tolerate  See #1  6. Migraines  Cont Topamax  See #1.    7. Connective tissue disorder with inflammatory arthritis  Being worked up by Rheum  See #1  Collow up with Rheum  Cont Plaquinil   Prednisone on hold due patient concern for CoVid

## 2018-12-14 NOTE — Patient Instructions (Signed)

## 2018-12-15 ENCOUNTER — Telehealth: Payer: Self-pay | Admitting: *Deleted

## 2018-12-15 NOTE — Telephone Encounter (Signed)
Left message requesting pt call back for results.

## 2018-12-15 NOTE — Telephone Encounter (Signed)
-----   Message from Trula Slade, DPM sent at 12/15/2018  7:15 AM EDT ----- Val- please let her know that the nails did not show fungus. I would recommend a biotin supplement and hydration to the nails with a light urea cream once the nails have healed. Can you please let her know? Thanks.

## 2018-12-15 NOTE — Telephone Encounter (Signed)
Pt called and I informed of Dr. Leigh [name] review of results and orders, I told pt that AmLactin is a good light OTC urea cream.

## 2018-12-21 ENCOUNTER — Other Ambulatory Visit: Payer: Self-pay

## 2018-12-21 ENCOUNTER — Ambulatory Visit (INDEPENDENT_AMBULATORY_CARE_PROVIDER_SITE_OTHER): Payer: 59 | Admitting: Allergy

## 2018-12-21 ENCOUNTER — Encounter: Payer: Self-pay | Admitting: Allergy

## 2018-12-21 VITALS — BP 128/72 | HR 96 | Temp 97.7°F | Resp 18 | Ht 70.0 in | Wt 330.0 lb

## 2018-12-21 DIAGNOSIS — H1013 Acute atopic conjunctivitis, bilateral: Secondary | ICD-10-CM | POA: Diagnosis not present

## 2018-12-21 DIAGNOSIS — J3089 Other allergic rhinitis: Secondary | ICD-10-CM

## 2018-12-21 DIAGNOSIS — J4530 Mild persistent asthma, uncomplicated: Secondary | ICD-10-CM

## 2018-12-21 DIAGNOSIS — G43009 Migraine without aura, not intractable, without status migrainosus: Secondary | ICD-10-CM

## 2018-12-21 MED ORDER — ADVAIR DISKUS 250-50 MCG/DOSE IN AEPB: INHALATION_SPRAY | RESPIRATORY_TRACT | 5 refills | Status: DC

## 2018-12-21 MED ORDER — MONTELUKAST SODIUM 10 MG PO TABS: 10.0000 mg | ORAL_TABLET | Freq: Every day | ORAL | 5 refills | Status: DC

## 2018-12-21 NOTE — Progress Notes (Signed)
Follow-up Note  RE: Holly Mcdonald MRN: [id number] DOB: December 25, 1975 Date of Office Visit: 12/21/2018   History of present illness: Holly Mcdonald is a 43 y.o. female presenting today for follow-up of asthma, allergic rhinitis with conjunctivitis and migraines.  She also has autoimmune disease followed rheumatologist and states work-up for definitive diagnosis is ongoing.  She still have a lot of joint pains however she states her pain is tolerable at this time.  She is no longer on daily use of prednisone.   She states with her allergies that as a long as she takes her zyrtec and singulair daily and uses her flonase daily that her allergies are controlled as well as migraines are controlled.   With her asthma she states with flaring of her autoimmune disease that her lungs are impacted and she is having for cough and SOB and has started to use her Advair 1 puff twice a day now.  She however reports infrequent use of Ventolin.   She states she will be having full lung function study with pulmonology upcoming.  She also states she is to have a sleep study done as well.    Review of systems: Review of Systems  Constitutional: Positive for malaise/fatigue. Negative for chills and fever.  HENT: Negative for congestion, ear discharge, ear pain, nosebleeds, sinus pain and sore throat.   Eyes: Negative for pain, discharge and redness.  Respiratory: Positive for cough and shortness of breath. Negative for hemoptysis, sputum production and wheezing.   Cardiovascular: Negative for chest pain.  Gastrointestinal: Negative for abdominal pain, constipation, diarrhea, nausea and vomiting.  Musculoskeletal: Positive for joint pain.  Skin: Negative for itching and rash.  Neurological: Negative for headaches.    All other systems negative unless noted above in HPI  Past medical/social/surgical/family history have been reviewed and are unchanged unless specifically indicated below.   No changes  Medication List: Allergies as of 12/21/2018      Reactions   Other Nausea And Vomiting   Darvocet N-100   Darvon [propoxyphene]       Medication List       Accurate as of December 21, 2018  6:16 PM. If you have any questions, ask your nurse or doctor.        STOP taking these medications   acetaminophen-codeine 300-30 MG tablet Commonly known as: TYLENOL #3 Stopped by: Maricela Schreur Charmian Muff, MD   cephALEXin 500 MG capsule Commonly known as: KEFLEX Stopped by: Aarushi Hemric Charmian Muff, MD     TAKE these medications   Advair Diskus 250-50 MCG/DOSE Aepb Generic drug: Fluticasone-Salmeterol INHALE 1 PUFF INTO THE LUNGS TWICE DAILY AS NEEDED   albuterol (2.5 MG/3ML) 0.083% nebulizer solution Commonly known as: PROVENTIL Take 3 mLs (2.5 mg total) by nebulization every 6 (six) hours as needed for wheezing or shortness of breath.   albuterol 108 (90 Base) MCG/ACT inhaler Commonly known as: VENTOLIN HFA Inhale 2 puffs into the lungs every 6 (six) hours as needed.   baclofen 10 MG tablet Commonly known as: LIORESAL TAKE 1 TABLET BY MOUTH THREE TIMES DAILY AS NEEDED FOR MUSCLE SPASMS   benzonatate 200 MG capsule Commonly known as: TESSALON Take 1 capsule (200 mg total) by mouth 3 (three) times daily as needed for cough.   cetirizine 10 MG tablet Commonly known as: ZYRTEC Take 1 tablet (10 mg total) by mouth every morning.   DULoxetine 60 MG capsule Commonly known as: CYMBALTA TAKE 1 CAPSULE(60 MG) BY MOUTH DAILY  Eliquis 5 MG Tabs tablet Generic drug: apixaban TAKE 1 TABLET(5 MG) BY MOUTH TWICE DAILY   FeroSul 325 (65 FE) MG tablet Generic drug: ferrous sulfate TAKE 3 TABLETS(975 MG) BY MOUTH EVERY MORNING   fluticasone 50 MCG/ACT nasal spray Commonly known as: FLONASE SHAKE LIQUID AND USE 1 SPRAY IN EACH NOSTRIL DAILY--  in am   hydroxychloroquine 200 MG tablet Commonly known as: PLAQUENIL Take 200 mg by mouth daily.   megestrol 20 MG tablet  Commonly known as: MEGACE Take one tablet by mouth twice daily for 7 days then daily as needed for bleeding   methocarbamol 500 MG tablet Commonly known as: Robaxin Take 1 tablet (500 mg total) by mouth 2 (two) times daily as needed for muscle spasms.   montelukast 10 MG tablet Commonly known as: SINGULAIR Take 1 tablet (10 mg total) by mouth at bedtime.   omeprazole 40 MG capsule Commonly known as: PRILOSEC TAKE 1 CAPSULE(40 MG) BY MOUTH DAILY   oxybutynin 5 MG tablet Commonly known as: DITROPAN Take 1 tablet by mouth 2 (two) times daily.   PREDNISONE PO Take 5 mg by mouth.   pregabalin 75 MG capsule Commonly known as: LYRICA TAKE ONE CAPSULE BY MOUTH THREE TIMES DAILY   timolol 0.5 % ophthalmic solution Commonly known as: TIMOPTIC Place 1 drop into both eyes every morning.   topiramate 25 MG tablet Commonly known as: TOPAMAX TK 3 TS PO D----- per pt takes 3 tablets qhs   VITAMIN B 12 PO Take 1,000 mg by mouth daily.   Vitamin D (Ergocalciferol) 1.25 MG (50000 UT) Caps capsule Commonly known as: DRISDOL TAKE 1 CAPSULE BY MOUTH EVERY 10 DAYS       Known medication allergies: Allergies  Allergen Reactions  . Other Nausea And Vomiting    Darvocet N-100  . Darvon [Propoxyphene]      Physical examination: Blood pressure 128/72, pulse 96, temperature 97.7 F (36.5 C), temperature source Temporal, resp. rate 18, height 5\' 10"  (1.778 m), weight (!) 330 lb (149.7 kg), SpO2 97 %.  General: Alert, interactive, in no acute distress. HEENT: PERRLA, TMs pearly gray, turbinates minimally edematous without discharge, post-pharynx non erythematous. Neck: Supple without lymphadenopathy. Lungs: Clear to auscultation without wheezing, rhonchi or rales. {no increased work of breathing. CV: Normal S1, S2 without murmurs. Abdomen: Nondistended, nontender. Skin: Warm and dry, without lesions or rashes. Extremities:  No clubbing, cyanosis or edema. Neuro:   Grossly intact.   Diagnositics/Labs: None today  Assessment and plan: Patient Instructions  Allergic rhinitis with conjunctivitis and migraines     - continue avoidance measures for tree pollen, molds, dust mites and cockroach.     - continue use of Cetirizine 10mg  daily    - continue Singulair 10mg  daily at night    - continue Fluticasone 1-2 sprays each nostril daily for nasal congestion    - consider allergen immunotherapy (allergy shots) if medication management becomes ineffective in controlling allergy symptoms.   Asthma, adult-onset    - continue Advair diskus 250-85mcg  1 puff twice a day.      - have access to Ventolin inhaler 2 puffs every 4-6 hours as needed for cough/wheeze/shortness of breath/chest tightness.  May use 15-20 minutes prior to activity.   Monitor frequency of use.    Asthma control goals:   Full participation in all desired activities (may need albuterol before activity)  Albuterol use two time or less a week on average (not counting use with activity)  Cough  interfering with sleep two time or less a month  Oral steroids no more than once a year  No hospitalizations  Will await results of lung function study through pulmonology  Follow-up 4-6 months or sooner if needed  I appreciate the opportunity to take part in Holly's care. Please do not hesitate to contact me with questions.  Sincerely,   Prudy Feeler, MD Allergy/Immunology [facility]

## 2018-12-21 NOTE — Patient Instructions (Signed)
Allergic rhinitis with conjunctivitis and migraines     - continue avoidance measures for tree pollen, molds, dust mites and cockroach.     - continue use of Cetirizine 10mg  daily    - continue Singulair 10mg  daily at night    - continue Fluticasone 1-2 sprays each nostril daily for nasal congestion    - consider allergen immunotherapy (allergy shots) if medication management becomes ineffective in controlling allergy symptoms.   Asthma, adult-onset    - continue Advair diskus 250-31mcg  1 puff twice a day.      - have access to Ventolin inhaler 2 puffs every 4-6 hours as needed for cough/wheeze/shortness of breath/chest tightness.  May use 15-20 minutes prior to activity.   Monitor frequency of use.    Asthma control goals:   Full participation in all desired activities (may need albuterol before activity)  Albuterol use two time or less a week on average (not counting use with activity)  Cough interfering with sleep two time or less a month  Oral steroids no more than once a year  No hospitalizations  Will await results of lung function study through pulmonology  Follow-up 4-6 months or sooner if needed

## 2019-01-04 ENCOUNTER — Telehealth: Payer: Self-pay | Admitting: Pulmonary Disease

## 2019-01-05 NOTE — Telephone Encounter (Signed)
Scheduled patient for PFT on 01/26/2019-pr

## 2019-01-09 DIAGNOSIS — R87610 Atypical squamous cells of undetermined significance on cytologic smear of cervix (ASC-US): Secondary | ICD-10-CM

## 2019-01-09 DIAGNOSIS — R87810 Cervical high risk human papillomavirus (HPV) DNA test positive: Secondary | ICD-10-CM

## 2019-01-09 HISTORY — DX: Atypical squamous cells of undetermined significance on cytologic smear of cervix (ASC-US): R87.610

## 2019-01-09 HISTORY — DX: Cervical high risk human papillomavirus (HPV) DNA test positive: R87.810

## 2019-01-12 NOTE — Progress Notes (Signed)
Patient is here today for follow-up appointment, recent procedure performed on 12/07/2018, removal of ingrown toenails both great toes, both medial and lateral borders.  She states that her toes are starting to feel much better than before, and she feels like they are healing well.  No redness, no erythema, no swelling, no drainage, no other signs symptoms of infection.  Discussed signs and symptoms of infection, verbal and written instructions were given.  She is to follow-up as needed with any acute symptom changes.

## 2019-01-17 ENCOUNTER — Other Ambulatory Visit: Payer: Self-pay | Admitting: Pulmonary Disease

## 2019-01-22 ENCOUNTER — Other Ambulatory Visit (HOSPITAL_COMMUNITY)
Admission: RE | Admit: 2019-01-22 | Discharge: 2019-01-22 | Disposition: A | Discharge status: Home / Self Care | Payer: 59 | Source: Ambulatory Visit | Attending: Pulmonary Disease | Admitting: Pulmonary Disease

## 2019-01-22 DIAGNOSIS — Z20828 Contact with and (suspected) exposure to other viral communicable diseases: Secondary | ICD-10-CM | POA: Insufficient documentation

## 2019-01-22 DIAGNOSIS — Z01812 Encounter for preprocedural laboratory examination: Secondary | ICD-10-CM | POA: Diagnosis present

## 2019-01-23 LAB — NOVEL CORONAVIRUS, NAA (HOSP ORDER, SEND-OUT TO REF LAB; TAT 18-24 HRS): SARS-CoV-2, NAA: NOT DETECTED

## 2019-01-24 ENCOUNTER — Other Ambulatory Visit: Payer: Self-pay | Admitting: Physical Medicine & Rehabilitation

## 2019-01-25 ENCOUNTER — Inpatient Hospital Stay: Payer: 59 | Attending: Hematology

## 2019-01-25 ENCOUNTER — Other Ambulatory Visit: Payer: Self-pay

## 2019-01-25 DIAGNOSIS — M129 Arthropathy, unspecified: Secondary | ICD-10-CM | POA: Insufficient documentation

## 2019-01-25 DIAGNOSIS — Z7901 Long term (current) use of anticoagulants: Secondary | ICD-10-CM | POA: Diagnosis not present

## 2019-01-25 DIAGNOSIS — E559 Vitamin D deficiency, unspecified: Secondary | ICD-10-CM | POA: Diagnosis not present

## 2019-01-25 DIAGNOSIS — K219 Gastro-esophageal reflux disease without esophagitis: Secondary | ICD-10-CM | POA: Diagnosis not present

## 2019-01-25 DIAGNOSIS — J45909 Unspecified asthma, uncomplicated: Secondary | ICD-10-CM | POA: Insufficient documentation

## 2019-01-25 DIAGNOSIS — M797 Fibromyalgia: Secondary | ICD-10-CM | POA: Diagnosis not present

## 2019-01-25 DIAGNOSIS — D500 Iron deficiency anemia secondary to blood loss (chronic): Secondary | ICD-10-CM | POA: Diagnosis not present

## 2019-01-25 DIAGNOSIS — Z86718 Personal history of other venous thrombosis and embolism: Secondary | ICD-10-CM | POA: Diagnosis not present

## 2019-01-25 DIAGNOSIS — Z7982 Long term (current) use of aspirin: Secondary | ICD-10-CM | POA: Diagnosis not present

## 2019-01-25 DIAGNOSIS — N920 Excessive and frequent menstruation with regular cycle: Secondary | ICD-10-CM | POA: Insufficient documentation

## 2019-01-25 DIAGNOSIS — Z79899 Other long term (current) drug therapy: Secondary | ICD-10-CM | POA: Diagnosis not present

## 2019-01-25 LAB — CBC WITH DIFFERENTIAL/PLATELET
Abs Immature Granulocytes: 0.02 10*3/uL (ref 0.00–0.07)
Basophils Absolute: 0 10*3/uL (ref 0.0–0.1)
Basophils Relative: 1 %
Eosinophils Absolute: 0 10*3/uL (ref 0.0–0.5)
Eosinophils Relative: 1 %
HCT: 38.3 % (ref 36.0–46.0)
Hemoglobin: 13 g/dL (ref 12.0–15.0)
Immature Granulocytes: 0 %
Lymphocytes Relative: 32 %
Lymphs Abs: 2 10*3/uL (ref 0.7–4.0)
MCH: 30.1 pg (ref 26.0–34.0)
MCHC: 33.9 g/dL (ref 30.0–36.0)
MCV: 88.7 fL (ref 80.0–100.0)
Monocytes Absolute: 0.6 10*3/uL (ref 0.1–1.0)
Monocytes Relative: 9 %
Neutro Abs: 3.7 10*3/uL (ref 1.7–7.7)
Neutrophils Relative %: 57 %
Platelets: 206 10*3/uL (ref 150–400)
RBC: 4.32 MIL/uL (ref 3.87–5.11)
RDW: 12.7 % (ref 11.5–15.5)
WBC: 6.4 10*3/uL (ref 4.0–10.5)
nRBC: 0 % (ref 0.0–0.2)

## 2019-01-25 LAB — IRON AND TIBC
Iron: 67 ug/dL (ref 41–142)
Saturation Ratios: 23 % (ref 21–57)
TIBC: 288 ug/dL (ref 236–444)
UIBC: 221 ug/dL (ref 120–384)

## 2019-01-25 LAB — FERRITIN: Ferritin: 117 ng/mL (ref 11–307)

## 2019-01-26 ENCOUNTER — Encounter: Payer: Self-pay | Admitting: Adult Health

## 2019-01-26 ENCOUNTER — Ambulatory Visit (INDEPENDENT_AMBULATORY_CARE_PROVIDER_SITE_OTHER): Payer: 59 | Admitting: Pulmonary Disease

## 2019-01-26 ENCOUNTER — Ambulatory Visit (INDEPENDENT_AMBULATORY_CARE_PROVIDER_SITE_OTHER): Payer: 59 | Admitting: Adult Health

## 2019-01-26 DIAGNOSIS — J45909 Unspecified asthma, uncomplicated: Secondary | ICD-10-CM

## 2019-01-26 DIAGNOSIS — R0602 Shortness of breath: Secondary | ICD-10-CM

## 2019-01-26 DIAGNOSIS — R400 Somnolence: Secondary | ICD-10-CM

## 2019-01-26 DIAGNOSIS — I2699 Other pulmonary embolism without acute cor pulmonale: Secondary | ICD-10-CM | POA: Diagnosis not present

## 2019-01-26 LAB — PULMONARY FUNCTION TEST
DL/VA % pred: 138 %
DL/VA: 5.84 ml/min/mmHg/L
DLCO unc % pred: 106 %
DLCO unc: 28.07 ml/min/mmHg
FEF 25-75 Post: 4.34 L/sec
FEF 25-75 Pre: 4.06 L/sec
FEF2575-%Change-Post: 6 %
FEF2575-%Pred-Post: 135 %
FEF2575-%Pred-Pre: 127 %
FEV1-%Change-Post: 0 %
FEV1-%Pred-Post: 99 %
FEV1-%Pred-Pre: 99 %
FEV1-Post: 3.04 L
FEV1-Pre: 3.04 L
FEV1FVC-%Change-Post: 6 %
FEV1FVC-%Pred-Pre: 101 %
FEV6-%Change-Post: -5 %
FEV6-%Pred-Post: 91 %
FEV6-%Pred-Pre: 96 %
FEV6-Post: 3.38 L
FEV6-Pre: 3.57 L
FEV6FVC-%Change-Post: 0 %
FEV6FVC-%Pred-Post: 101 %
FEV6FVC-%Pred-Pre: 101 %
FVC-%Change-Post: -5 %
FVC-%Pred-Post: 90 %
FVC-%Pred-Pre: 95 %
FVC-Post: 3.38 L
FVC-Pre: 3.59 L
Post FEV1/FVC ratio: 90 %
Post FEV6/FVC ratio: 100 %
Pre FEV1/FVC ratio: 85 %
Pre FEV6/FVC Ratio: 99 %
RV % pred: 69 %
RV: 1.34 L
TLC % pred: 84 %
TLC: 5.01 L

## 2019-01-26 NOTE — Assessment & Plan Note (Signed)
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 Patient has known chronic anemia is followed by hematology.  She also has complicated by uterine fibroids with dysfunctional bleeding.  She is not a surgical candidate.  She is had a previous retinal hemorrhage.  Continue to follow closely.  Also she has a Mirena and uses Megace intermittently for her dysfunctional bleeding.  Continue to monitor closely as both of these have increased risk for thromboembolism .

## 2019-01-26 NOTE — Patient Instructions (Addendum)
Continue on Advair 1 puff Twice daily  ,rinse after use.  Continue on Singulair 10mg  At bedtime   Continue on Zyrtec 10mg  At bedtime   Continue on Flonase daily  Ventolin  As needed  Wheezing  Refer to weight loss center.  Set up for Home sleep study .  Follow up with Dr. Elsworth Soho in 6 months and As needed

## 2019-01-26 NOTE — Assessment & Plan Note (Signed)
Mild persistent asthma currently under control with Advair.  Continue on trigger prevention.  Suspect her underlying dyspnea is multifactorial with underlying morbid obesity, sedentary lifestyle with chronic fatigue and chronic pain disorder.  Also underlying autoimmune disorder, polypharmacy and chronic anemia.  Pulmonary function testing today shows normal lung function with no airflow obstruction or restriction.  DLCO is normal. Previous CXR showed clear lungs.  Continue to monitor .   Plan  Patient Instructions  Continue on Advair 1 puff Twice daily  ,rinse after use.  Continue on Singulair 10mg  At bedtime   Continue on Zyrtec 10mg  At bedtime   Continue on Flonase daily  Ventolin  As needed  Wheezing  Refer to weight loss center.  Set up for Home sleep study .  Follow up with Dr. Elsworth Soho in 6 months and As needed

## 2019-01-26 NOTE — Progress Notes (Signed)
@Patient  ID: Holly Mcdonald, female    DOB: 1975/11/29, 43 y.o.   MRN: [id number]  Chief Complaint  Patient presents with  . Follow-up    Asthma     Referring provider: [place], Betty G, MD  HPI: 43 year old female never smoker followed for asthma and previous unprovoked bilateral PE (2018-treated with Eliquis,  Discontinued briefly due to retinal hemorrhages, Eliquis restarted 2019-lifelong )  Medical history significant for fibromyalgia, iron deficiency anemia, uterine fibroids and  Inflammatory Arthritis /early CTD -on Plaquenil and prednisone 10mg  daily   TEST/EVENTS :  6/2018CT angiogram of the chest showed acute segmental pulmonary emboli in bilateral lower lobes, no CT evidence of right heart strain. Doppler lower extremity was negative for DVT. Hypercoagulable panel negative  Spirometry 12/2016 >. FEV 1 90%  01/26/2019 Follow up : Asthma  Patient presents for a 26-month follow-up patient has underlying asthma is on Advair twice daily.  Says overall breathing is doing okay but still has dyspnea with activity . Has chronic fatigue and low energy. That along with chronic pain with FM and Arthritis makes it hard for her to be active. Gets winded with activities.  Says she is not active, sedentary due pain. Has occasional cough . Feels Advair helps. No wheezing .  PFTs today show normal lung function with no airflow obstruction or restriction.  FEV1 99%, ratio 90, FVC 90%, no significant bronchodilator response, normal mid flows.  DLCO 106%  She has a history of unprovoked PE in 2018 she is on lifelong Eliquis.  Denies any known bleeding.  She does have iron deficiency anemia is followed by hematology. Has uterine fibroids with dysfunctional bleeding . Has Mirena and uses Megace As needed   Not a surgeon candidate at this time due to multiple co-morbities.   Has chronic rhinitis.  She is on Singulair daily.  She uses Flonase and Zyrtec.  She is followed by  rheumatology for inflammatory arthritis and early connective tissue disease.  Remains on Plaquenil. Weaned off prednisone. Now on Methotrexate. Says joint pain is some better.   Followed by pain management for joint pain and fibromyalgia .  On Lyrica and Cymbalta .   Has daytime sleepiness . Feels rested upon rising . Does snore. Sleeps well at night .  Epworth score today 15.  At risk for sleep apnea .  Weighed 156 at [facility] graduation . Currently 336 lbs   Allergies  Allergen Reactions  . Other Nausea And Vomiting    Darvocet N-100  . Darvon [Propoxyphene]     Immunization History  Administered Date(s) Administered  . Influenza,inj,Quad PF,6+ Mos 01/25/2017, 02/15/2018  . Pneumococcal Polysaccharide-23 11/04/2016    Past Medical History:  Diagnosis Date  . Anticoagulated    eliquis  . Arthritis    inflammatory  . Bladder spasm   . Connective tissue disease ([place]) dx oct 2019  . Fibromyalgia   . GERD (gastroesophageal reflux disease)   . Heavy menstrual bleeding    resolved with megace  . History of chronic bronchitis    last flare up 11-18-17  . History of pulmonary embolism 10/2016--- followed by pcp and pulmoloigst   dx bilateral PE , bilateral lower lobes , segmental , unprovoked, negative hypercoagulable work-up-- treated w/ eliquis  . Immune deficiency disorder ([place])   . Iron deficiency anemia   . Migraine   . Mild intermittent asthma    pulmologist-  dr Elsworth Soho  . Mixed connective tissue disease ([place])   . Morbid obesity with  BMI of 45.0-49.9, adult ([place])   . Open-angle glaucoma of both eyes   . Pre-diabetes   . 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 Urgency of urination   . Uterine fibroid   . Vitamin D deficiency   . Wears glasses     Tobacco History: Social History   Tobacco Use  Smoking Status Never Smoker  Smokeless Tobacco Never Used   Counseling given: Not Answered   Outpatient Medications Prior to Visit  Medication Sig  Dispense Refill  . ADVAIR DISKUS 250-50 MCG/DOSE AEPB INHALE 1 PUFF INTO THE LUNGS TWICE DAILY AS NEEDED 60 each 5  . albuterol (PROVENTIL) (2.5 MG/3ML) 0.083% nebulizer solution Take 3 mLs (2.5 mg total) by nebulization every 6 (six) hours as needed for wheezing or shortness of breath. 360 mL 5  . albuterol (VENTOLIN HFA) 108 (90 Base) MCG/ACT inhaler Inhale 2 puffs into the lungs every 6 (six) hours as needed. 18 g 0  . baclofen (LIORESAL) 10 MG tablet TAKE 1 TABLET BY MOUTH THREE TIMES DAILY AS NEEDED FOR MUSCLE SPASMS 90 tablet 5  . benzonatate (TESSALON) 200 MG capsule Take 1 capsule (200 mg total) by mouth 3 (three) times daily as needed for cough. 30 capsule 1  . cetirizine (ZYRTEC) 10 MG tablet Take 1 tablet (10 mg total) by mouth every morning. 30 tablet 5  . Cyanocobalamin (VITAMIN B 12 PO) Take 1,000 mg by mouth daily.     . DULoxetine (CYMBALTA) 60 MG capsule TAKE 1 CAPSULE(60 MG) BY MOUTH DAILY 30 capsule 2  . ELIQUIS 5 MG TABS tablet TAKE 1 TABLET(5 MG) BY MOUTH TWICE DAILY 60 tablet 5  . FEROSUL 325 (65 Fe) MG tablet TAKE 3 TABLETS(975 MG) BY MOUTH EVERY MORNING 90 tablet 5  . fluticasone (FLONASE) 50 MCG/ACT nasal spray SHAKE LIQUID AND USE 1 SPRAY IN EACH NOSTRIL DAILY--  in am 16 g 2  . hydroxychloroquine (PLAQUENIL) 200 MG tablet Take 200 mg by mouth daily.    . megestrol (MEGACE) 20 MG tablet Take one tablet by mouth twice daily for 7 days then daily as needed for bleeding 30 tablet 0  . methocarbamol (ROBAXIN) 500 MG tablet Take 1 tablet (500 mg total) by mouth 2 (two) times daily as needed for muscle spasms. 60 tablet 1  . montelukast (SINGULAIR) 10 MG tablet Take 1 tablet (10 mg total) by mouth at bedtime. 30 tablet 5  . omeprazole (PRILOSEC) 40 MG capsule TAKE 1 CAPSULE(40 MG) BY MOUTH DAILY 30 capsule 5  . oxybutynin (DITROPAN) 5 MG tablet Take 1 tablet by mouth 2 (two) times daily.  11  . PREDNISONE PO Take 5 mg by mouth.     . pregabalin (LYRICA) 75 MG capsule TAKE 1  CAPSULE BY MOUTH THREE TIMES DAILY 90 capsule 3  . timolol (TIMOPTIC) 0.5 % ophthalmic solution Place 1 drop into both eyes every morning.    . topiramate (TOPAMAX) 25 MG tablet TK 3 TS PO D----- per pt takes 3 tablets qhs  1  . Vitamin D, Ergocalciferol, (DRISDOL) 1.25 MG (50000 UT) CAPS capsule TAKE 1 CAPSULE BY MOUTH EVERY 10 DAYS 10 capsule 1   No facility-administered medications prior to visit.      Review of Systems:   Constitutional:   No  weight loss, night sweats,  Fevers, chills, fatigue, or  lassitude.  HEENT:   No headaches,  Difficulty swallowing,  Tooth/dental problems, or  Sore throat,  No sneezing, itching, ear ache, nasal congestion, post nasal drip,   CV:  No chest pain,  Orthopnea, PND, swelling in lower extremities, anasarca, dizziness, palpitations, syncope.   GI  No heartburn, indigestion, abdominal pain, nausea, vomiting, diarrhea, change in bowel habits, loss of appetite, bloody stools.   Resp: No shortness of breath with exertion or at rest.  No excess mucus, no productive cough,  No non-productive cough,  No coughing up of blood.  No change in color of mucus.  No wheezing.  No chest wall deformity  Skin: no rash or lesions.  GU: no dysuria, change in color of urine, no urgency or frequency.  No flank pain, no hematuria   MS:  No joint pain or swelling.  No decreased range of motion.  No back pain.    Physical Exam  BP 116/74 (BP Location: Left Arm, Cuff Size: Large)   Pulse 78   Temp (!) 97 F (36.1 C) (Temporal)   Ht 5\' 10"  (1.778 m)   Wt (!) 336 lb (152.4 kg)   SpO2 97%   BMI 48.21 kg/m   GEN: A/Ox3; pleasant , NAD, well nourished    HEENT:  [place]/AT,  EACs-clear, TMs-wnl, NOSE-clear, THROAT-clear, no lesions, no postnasal drip or exudate noted.   NECK:  Supple w/ fair ROM; no JVD; normal carotid impulses w/o bruits; no thyromegaly or nodules palpated; no lymphadenopathy.    RESP  Clear  P & A; w/o, wheezes/ rales/ or rhonchi. no  accessory muscle use, no dullness to percussion  CARD:  RRR, no m/r/g, no peripheral edema, pulses intact, no cyanosis or clubbing.  GI:   Soft & nt; nml bowel sounds; no organomegaly or masses detected.   Musco: Warm bil, no deformities or joint swelling noted.   Neuro: alert, no focal deficits noted.    Skin: Warm, no lesions or rashes    Lab Results:  CBC    Component Value Date/Time   WBC 6.4 01/25/2019 0821   RBC 4.32 01/25/2019 0821   HGB 13.0 01/25/2019 0821   HCT 38.3 01/25/2019 0821   PLT 206 01/25/2019 0821   MCV 88.7 01/25/2019 0821   MCH 30.1 01/25/2019 0821   MCHC 33.9 01/25/2019 0821   RDW 12.7 01/25/2019 0821   LYMPHSABS 2.0 01/25/2019 0821   MONOABS 0.6 01/25/2019 0821   EOSABS 0.0 01/25/2019 0821   BASOSABS 0.0 01/25/2019 0821    BMET    Component Value Date/Time   NA 141 10/25/2018 1037   K 3.9 10/25/2018 1037   CL 110 10/25/2018 1037   CO2 21 (L) 10/25/2018 1037   GLUCOSE 107 (H) 10/25/2018 1037   BUN 8 10/25/2018 1037   CREATININE 0.80 10/25/2018 1037   CALCIUM 9.3 10/25/2018 1037   GFRNONAA >60 10/25/2018 1037   GFRAA >60 10/25/2018 1037    BNP No results found for: BNP  ProBNP No results found for: PROBNP  Imaging: No results found.    PFT Results Latest Ref Rng & Units 01/26/2019  FVC-Pre L 3.59  FVC-Predicted Pre % 95  FVC-Post L 3.38  FVC-Predicted Post % 90  Pre FEV1/FVC % % 85  Post FEV1/FCV % % 90  FEV1-Pre L 3.04  FEV1-Predicted Pre % 99  FEV1-Post L 3.04  DLCO UNC% % 106  DLCO COR %Predicted % 138  TLC L 5.01  TLC % Predicted % 84  RV % Predicted % 69    Lab Results  Component Value Date   NITRICOXIDE 25 05/08/2018  Assessment & Plan:   Pulmonary embolus (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 Patient has known chronic anemia is followed by hematology.  She also has complicated by uterine fibroids with dysfunctional bleeding.  She is not a surgical candidate.  She is had a  previous retinal hemorrhage.  Continue to follow closely.  Also she has a Mirena and uses Megace intermittently for her dysfunctional bleeding.  Continue to monitor closely as both of these have increased risk for thromboembolism .    Asthma in adult, unspecified asthma severity, uncomplicated Mild persistent asthma currently under control with Advair.  Continue on trigger prevention.  Suspect her underlying dyspnea is multifactorial with underlying morbid obesity, sedentary lifestyle with chronic fatigue and chronic pain disorder.  Also underlying autoimmune disorder, polypharmacy and chronic anemia.  Pulmonary function testing today shows normal lung function with no airflow obstruction or restriction.  DLCO is normal. Previous CXR showed clear lungs.  Continue to monitor .   Plan  Patient Instructions  Continue on Advair 1 puff Twice daily  ,rinse after use.  Continue on Singulair 10mg  At bedtime   Continue on Zyrtec 10mg  At bedtime   Continue on Flonase daily  Ventolin  As needed  Wheezing  Refer to weight loss center.  Set up for Home sleep study .  Follow up with Dr. Elsworth Soho in 6 months and As needed          Obesity, Class III, BMI 40-49.9 (morbid obesity) ([place]) Discussed healthy weight  Refer to weight loss center.    Daytime sleepiness Daytime sleepiness, snoring and morbid obesity all increase her risk factors for's underlying sleep apnea. Set up for home sleep study     Rexene Edison, NP 01/26/2019

## 2019-01-26 NOTE — Assessment & Plan Note (Signed)
Daytime sleepiness, snoring and morbid obesity all increase her risk factors for's underlying sleep apnea. Set up for home sleep study

## 2019-01-26 NOTE — Addendum Note (Signed)
Addended by: Parke Poisson E on: 01/26/2019 12:19 PM   Modules accepted: Orders

## 2019-01-26 NOTE — Assessment & Plan Note (Signed)
Discussed healthy weight  Refer to weight loss center.

## 2019-01-26 NOTE — Progress Notes (Signed)
PFT done today. 

## 2019-01-27 ENCOUNTER — Encounter: Payer: Self-pay | Admitting: Hematology

## 2019-01-30 ENCOUNTER — Encounter: Payer: Self-pay | Admitting: Gynecology

## 2019-01-30 ENCOUNTER — Other Ambulatory Visit: Payer: Self-pay | Admitting: Physical Medicine & Rehabilitation

## 2019-02-06 ENCOUNTER — Other Ambulatory Visit: Payer: Self-pay

## 2019-02-07 ENCOUNTER — Ambulatory Visit (INDEPENDENT_AMBULATORY_CARE_PROVIDER_SITE_OTHER): Payer: 59 | Admitting: Gynecology

## 2019-02-07 ENCOUNTER — Encounter: Payer: Self-pay | Admitting: Gynecology

## 2019-02-07 VITALS — BP 126/82 | Ht 70.0 in | Wt 338.0 lb

## 2019-02-07 DIAGNOSIS — Z30431 Encounter for routine checking of intrauterine contraceptive device: Secondary | ICD-10-CM | POA: Diagnosis not present

## 2019-02-07 DIAGNOSIS — Z01419 Encounter for gynecological examination (general) (routine) without abnormal findings: Secondary | ICD-10-CM | POA: Diagnosis not present

## 2019-02-07 DIAGNOSIS — D259 Leiomyoma of uterus, unspecified: Secondary | ICD-10-CM | POA: Diagnosis not present

## 2019-02-07 DIAGNOSIS — Z1151 Encounter for screening for human papillomavirus (HPV): Secondary | ICD-10-CM

## 2019-02-07 DIAGNOSIS — Z113 Encounter for screening for infections with a predominantly sexual mode of transmission: Secondary | ICD-10-CM

## 2019-02-07 NOTE — Addendum Note (Signed)
Addended by: Nelva Nay on: 02/07/2019 10:09 AM   Modules accepted: Orders

## 2019-02-07 NOTE — Progress Notes (Signed)
    Holly Mcdonald 1975/11/12 [id number]        43 y.o.  G0P0000 for annual gynecologic exam.  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  Status post hysteroscopic resection submucous myoma x2.  Continued with heavy menses.  History of pulmonary embolus on anticoagulation.  History of retinal hemorrhage.  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  Consult at [facility] to consider surgery to include laparoscopic hysterectomy declined due to a surgical risks.  Trial of Depo-Lupron in the past.  Now with Mirena IUD placed 04/2018.  Menses are lighter.  Occasional breakthrough bleeding.  Uses Megace occasionally with heavy menses.  Recent blood work reported with hemoglobin of 13.  Past medical history,surgical history, problem list, medications, allergies, family history and social history were all reviewed and documented as reviewed in the EPIC chart.  ROS:  Performed with pertinent positives and negatives included in the history, assessment and plan.   Additional significant findings : None   Exam: Holly Mcdonald assistant Vitals:   02/07/19 0923  BP: 126/82  Weight: (!) 338 lb (153.3 kg)  Height: 5\' 10"  (1.778 m)   Body mass index is 48.5 kg/m.  General appearance:  Normal affect, orientation and appearance. Skin: Grossly normal HEENT: Without gross lesions.  No cervical or supraclavicular adenopathy. Thyroid normal.  Lungs:  Clear without wheezing, rales or rhonchi Cardiac: RR, without RMG Abdominal:  Soft, nontender, without masses, guarding, rebound, organomegaly or hernia Breasts:  Examined lying and sitting without masses, retractions, discharge or axillary adenopathy. Pelvic:  Ext, BUS, Vagina: Normal  Cervix: Normal.  IUD string visualized.  Pap smear/HPV with GC/chlamydia  Uterus: Grossly normal size, shape and contour, midline and mobile nontender   Adnexa: Without masses or tenderness    Anus and perineum: Normal   Rectovaginal:  Normal sphincter tone without palpated masses or tenderness.    Assessment/Plan:  43 y.o. G0P0000 female for annual gynecologic exam.  With regular menses, Mirena IUD  1. History as above.  Managing with the IUD as far as menstrual heaviness.  Will continue for now. 2. History of multiple small myomas.  Uterus grossly palpates normal in size.  Will follow for now. 3. Pap smear 2019.  She requests annual Pap smear.  With history of immunodeficiency disorder feel reasonable continue with annual cytology.  Pap smear/HPV today. 4. STD screening requested.  No known exposure.  She did have trichomonas on Pap smear last year with follow-up GC/chlamydia screen negative.  GC/chlamydia screen on Pap smear done today.  HIV, RPR, hepatitis B, hepatitis C ordered. 5. Mammography due next month and patient reports this scheduled.  Breast exam normal today. 6. Health maintenance.  Patient reports routine blood work done elsewhere.  Follow-up 1 year, sooner as needed.  Anastasio Auerbach MD, 9:54 AM 02/07/2019

## 2019-02-07 NOTE — Patient Instructions (Signed)
Follow-up for annual exam in 1 year, sooner if any issues

## 2019-02-08 LAB — HEPATITIS C ANTIBODY
Hepatitis C Ab: NONREACTIVE
SIGNAL TO CUT-OFF: 0 (ref <1.00)

## 2019-02-08 LAB — RPR: RPR Ser Ql: NONREACTIVE

## 2019-02-08 LAB — HIV ANTIBODY (ROUTINE TESTING W REFLEX): HIV 1&2 Ab, 4th Generation: NONREACTIVE

## 2019-02-08 LAB — HEPATITIS B SURFACE ANTIGEN: Hepatitis B Surface Ag: NONREACTIVE

## 2019-02-09 LAB — PAP IG, CT-NG NAA, HPV HIGH-RISK
C. trachomatis RNA, TMA: NOT DETECTED
HPV DNA High Risk: DETECTED — AB
N. gonorrhoeae RNA, TMA: NOT DETECTED

## 2019-02-12 ENCOUNTER — Encounter: Payer: Self-pay | Admitting: Gynecology

## 2019-02-13 ENCOUNTER — Ambulatory Visit: Payer: 59

## 2019-02-13 ENCOUNTER — Other Ambulatory Visit: Payer: Self-pay

## 2019-02-13 DIAGNOSIS — R400 Somnolence: Secondary | ICD-10-CM

## 2019-02-14 DIAGNOSIS — G4733 Obstructive sleep apnea (adult) (pediatric): Secondary | ICD-10-CM

## 2019-02-15 DIAGNOSIS — G4733 Obstructive sleep apnea (adult) (pediatric): Secondary | ICD-10-CM

## 2019-02-20 ENCOUNTER — Telehealth: Payer: Self-pay | Admitting: Adult Health

## 2019-02-20 NOTE — Telephone Encounter (Signed)
Home sleep study February 14, 2019 showed mild to moderate obstructive sleep apnea with AHI 9.3/hour (events were worse in supine position-16/hours)  Please make an office or virtual visit to discuss her results and treatment options with Dr. Marcello Moores NP

## 2019-02-20 NOTE — Telephone Encounter (Signed)
ATC pt, no answer. Left message for pt to call back.  

## 2019-02-20 NOTE — Telephone Encounter (Signed)
LMTCB

## 2019-02-20 NOTE — Telephone Encounter (Signed)
Pt returned call. If no answer please leave results on VM per pt

## 2019-02-20 NOTE — Telephone Encounter (Signed)
Called and spoke w/ pt regarding her results and recommendations per TP. Pt verbalized understanding and states she will call our office back tomorrow to create a follow-up visit as she is at work and pressed for time right now. Will await her call back.

## 2019-03-01 ENCOUNTER — Other Ambulatory Visit: Payer: Self-pay

## 2019-03-01 ENCOUNTER — Encounter: Payer: Self-pay | Admitting: Adult Health

## 2019-03-01 ENCOUNTER — Ambulatory Visit (INDEPENDENT_AMBULATORY_CARE_PROVIDER_SITE_OTHER): Payer: 59 | Admitting: Adult Health

## 2019-03-01 VITALS — BP 110/82 | HR 107 | Temp 97.0°F | Ht 70.0 in | Wt 337.0 lb

## 2019-03-01 DIAGNOSIS — G4733 Obstructive sleep apnea (adult) (pediatric): Secondary | ICD-10-CM | POA: Diagnosis not present

## 2019-03-01 NOTE — Progress Notes (Signed)
@Patient  ID: Holly Mcdonald, female    DOB: April 30, 1976, 43 y.o.   MRN: [id number]  Chief Complaint  Patient presents with  . Follow-up    OSA    Referring provider: [place], Betty G, MD  HPI: 43 year old female never smoker followed for asthma and previous unprovoked bilateral PE (2018-treated with Eliquis, Discontinued brieflydue to retinal hemorrhages,Eliquisrestarted 2019-lifelong ) Medical history significant for fibromyalgia, iron deficiency anemia, uterine fibroids and  Inflammatory Arthritis /early CTD -on Plaquenil and prednisone 10mg daily Works in insurance  TEST/EVENTS :  Home sleep study February 14, 2019 showed mild to moderate obstructive sleep apnea with AHI 9.3/hour (events were worse in supine position-16/hours)  6/2018CT angiogram of the chest showed acute segmental pulmonary emboli in bilateral lower lobes, no CT evidence of right heart strain. Doppler lower extremity was negative for DVT. Hypercoagulable panel negative  Spirometry 12/2016 >. FEV 1 90%  PFTs today show normal lung function with no airflow obstruction or restriction.  FEV1 99%, ratio 90, FVC 90%, no significant bronchodilator response, normal mid flows.  DLCO 106%  03/01/2019 Follow up : OSA  Patient returns for a 1 month follow-up for obstructive sleep apnea.  At last visit patient had complained of daytime sleepiness restless sleep and snoring.  Epworth score was 15.  She had risk factors for sleep apnea.  She was set up for a home sleep study that showed mild to moderate obstructive sleep apnea with AHI 9.3/hour (events were worse in supine position with AHI 16/hour).  We went over her test results.  Discussed sleep apnea, potential complications of untreated sleep apnea including cardiovascular events and treatment options for sleep apnea including mouthguard weight loss and CPAP.        Allergies  Allergen Reactions  . Other Nausea And Vomiting    Darvocet N-100  . Darvon  [Propoxyphene]     Immunization History  Administered Date(s) Administered  . Influenza,inj,Quad PF,6+ Mos 01/25/2017, 02/15/2018  . Influenza-Unspecified 02/02/2019  . Pneumococcal Polysaccharide-23 11/04/2016    Past Medical History:  Diagnosis Date  . Anticoagulated    eliquis  . Arthritis    inflammatory  . Arthritis   . 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 Bladder spasm   . Connective tissue disease ([place]) dx oct 2019  . Fibromyalgia   . GERD (gastroesophageal reflux disease)   . Heavy menstrual bleeding    resolved with megace  . History of chronic bronchitis    last flare up 11-18-17  . History of pulmonary embolism 10/2016--- followed by pcp and pulmoloigst   dx bilateral PE , bilateral lower lobes , segmental , unprovoked, negative hypercoagulable work-up-- treated w/ eliquis  . Immune deficiency disorder ([place])   . Iron deficiency anemia   . Migraine   . Mild intermittent asthma    pulmologist-  dr Elsworth Soho  . Mixed connective tissue disease ([place])   . Morbid obesity with BMI of 45.0-49.9, adult ([place])   . Open-angle glaucoma of both eyes   . Pre-diabetes   . 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 Urgency of urination   . Uterine fibroid   . Vitamin D deficiency   . Wears glasses     Tobacco History: Social History   Tobacco Use  Smoking Status Never Smoker  Smokeless Tobacco Never Used   Counseling given: Not Answered   Outpatient Medications Prior to Visit  Medication Sig Dispense Refill  . ADVAIR DISKUS 250-50 MCG/DOSE AEPB  INHALE 1 PUFF INTO THE LUNGS TWICE DAILY AS NEEDED 60 each 5  . albuterol (PROVENTIL) (2.5 MG/3ML) 0.083% nebulizer solution Take 3 mLs (2.5 mg total) by nebulization every 6 (six) hours as needed for wheezing or shortness of breath. 360 mL 5  . albuterol (VENTOLIN HFA) 108 (90 Base) MCG/ACT inhaler Inhale 2 puffs into the lungs every 6 (six) hours as needed. 18 g 0  . baclofen (LIORESAL) 10  MG tablet TAKE 1 TABLET BY MOUTH THREE TIMES DAILY AS NEEDED FOR MUSCLE SPASMS 90 tablet 5  . benzonatate (TESSALON) 200 MG capsule Take 1 capsule (200 mg total) by mouth 3 (three) times daily as needed for cough. 30 capsule 1  . cetirizine (ZYRTEC) 10 MG tablet Take 1 tablet (10 mg total) by mouth every morning. 30 tablet 5  . Cyanocobalamin (VITAMIN B 12 PO) Take 1,000 mg by mouth daily.     . DULoxetine (CYMBALTA) 60 MG capsule TAKE 1 CAPSULE(60 MG) BY MOUTH DAILY 30 capsule 2  . ELIQUIS 5 MG TABS tablet TAKE 1 TABLET(5 MG) BY MOUTH TWICE DAILY 60 tablet 5  . FEROSUL 325 (65 Fe) MG tablet TAKE 3 TABLETS(975 MG) BY MOUTH EVERY MORNING 90 tablet 5  . fluticasone (FLONASE) 50 MCG/ACT nasal spray SHAKE LIQUID AND USE 1 SPRAY IN EACH NOSTRIL DAILY--  in am 16 g 2  . folic acid (FOLVITE) 1 MG tablet Take 1 mg by mouth daily.    . hydroxychloroquine (PLAQUENIL) 200 MG tablet Take 200 mg by mouth daily.    . megestrol (MEGACE) 20 MG tablet Take one tablet by mouth twice daily for 7 days then daily as needed for bleeding 30 tablet 0  . methocarbamol (ROBAXIN) 500 MG tablet Take 1 tablet (500 mg total) by mouth 2 (two) times daily as needed for muscle spasms. 60 tablet 1  . methotrexate 2.5 MG tablet Take by mouth once a week.    . montelukast (SINGULAIR) 10 MG tablet Take 1 tablet (10 mg total) by mouth at bedtime. 30 tablet 5  . omeprazole (PRILOSEC) 40 MG capsule TAKE 1 CAPSULE(40 MG) BY MOUTH DAILY 30 capsule 5  . oxybutynin (DITROPAN) 5 MG tablet Take 1 tablet by mouth 2 (two) times daily.  11  . pregabalin (LYRICA) 75 MG capsule TAKE 1 CAPSULE BY MOUTH THREE TIMES DAILY 90 capsule 3  . timolol (TIMOPTIC) 0.5 % ophthalmic solution Place 1 drop into both eyes every morning.    . topiramate (TOPAMAX) 25 MG tablet TK 3 TS PO D----- per pt takes 3 tablets qhs  1  . Vitamin D, Ergocalciferol, (DRISDOL) 1.25 MG (50000 UT) CAPS capsule TAKE 1 CAPSULE BY MOUTH EVERY 10 DAYS 10 capsule 1  . PREDNISONE PO  Take 5 mg by mouth.      No facility-administered medications prior to visit.      Review of Systems:   Constitutional:   No  weight loss, night sweats,  Fevers, chills, fatigue, or  lassitude.  HEENT:   No headaches,  Difficulty swallowing,  Tooth/dental problems, or  Sore throat,                No sneezing, itching, ear ache, nasal congestion, post nasal drip,   CV:  No chest pain,  Orthopnea, PND, swelling in lower extremities, anasarca, dizziness, palpitations, syncope.   GI  No heartburn, indigestion, abdominal pain, nausea, vomiting, diarrhea, change in bowel habits, loss of appetite, bloody stools.   Resp: No shortness of breath  with exertion or at rest.  No excess mucus, no productive cough,  No non-productive cough,  No coughing up of blood.  No change in color of mucus.  No wheezing.  No chest wall deformity  Skin: no rash or lesions.  GU: no dysuria, change in color of urine, no urgency or frequency.  No flank pain, no hematuria   MS: Chronic joint pain    Physical Exam  BP 110/82   Pulse (!) 107   Temp (!) 97 F (36.1 C) (Temporal)   Ht 5\' 10"  (1.778 m)   Wt (!) 337 lb (152.9 kg)   SpO2 99%   BMI 48.35 kg/m   GEN: A/Ox3; pleasant , NAD, MO per BMI    HEENT:  [place]/AT,   NOSE-clear, THROAT-clear, no lesions, no postnasal drip or exudate noted.  Class III MP  airway  NECK:  Supple w/ fair ROM; no JVD; normal carotid impulses w/o bruits; no thyromegaly or nodules palpated; no lymphadenopathy.    RESP  Clear  P & A; w/o, wheezes/ rales/ or rhonchi. no accessory muscle use, no dullness to percussion  CARD:  RRR, no m/r/g, no peripheral edema, pulses intact, no cyanosis or clubbing.  GI:   Soft & nt; nml bowel sounds; no organomegaly or masses detected.   Musco: Warm bil, no deformities or joint swelling noted.   Neuro: alert, no focal deficits noted.    Skin: Warm, no lesions or rashes    Lab Results:  CBC    Component Value Date/Time   WBC 6.4  01/25/2019 0821   RBC 4.32 01/25/2019 0821   HGB 13.0 01/25/2019 0821   HCT 38.3 01/25/2019 0821   PLT 206 01/25/2019 0821   MCV 88.7 01/25/2019 0821   MCH 30.1 01/25/2019 0821   MCHC 33.9 01/25/2019 0821   RDW 12.7 01/25/2019 0821   LYMPHSABS 2.0 01/25/2019 0821   MONOABS 0.6 01/25/2019 0821   EOSABS 0.0 01/25/2019 0821   BASOSABS 0.0 01/25/2019 0821    BMET    Component Value Date/Time   NA 141 10/25/2018 1037   K 3.9 10/25/2018 1037   CL 110 10/25/2018 1037   CO2 21 (L) 10/25/2018 1037   GLUCOSE 107 (H) 10/25/2018 1037   BUN 8 10/25/2018 1037   CREATININE 0.80 10/25/2018 1037   CALCIUM 9.3 10/25/2018 1037   GFRNONAA >60 10/25/2018 1037   GFRAA >60 10/25/2018 1037    BNP No results found for: BNP  ProBNP No results found for: PROBNP  Imaging: No results found.    PFT Results Latest Ref Rng & Units 01/26/2019  FVC-Pre L 3.59  FVC-Predicted Pre % 95  FVC-Post L 3.38  FVC-Predicted Post % 90  Pre FEV1/FVC % % 85  Post FEV1/FCV % % 90  FEV1-Pre L 3.04  FEV1-Predicted Pre % 99  FEV1-Post L 3.04  DLCO UNC% % 106  DLCO COR %Predicted % 138  TLC L 5.01  TLC % Predicted % 84  RV % Predicted % 69    Lab Results  Component Value Date   NITRICOXIDE 25 05/08/2018        Assessment & Plan:   No problem-specific Assessment & Plan notes found for this encounter.     Rexene Edison, NP 03/01/2019

## 2019-03-01 NOTE — Patient Instructions (Addendum)
Begin CPAP At bedtime  .  Try to wear for at least 4hr or more each and every night .  Work on healthy weight loss.  Do not drive if sleepy .  Continue on Advair 1 puff Twice daily  ,rinse after use.  Continue on Singulair 10mg  At bedtime   Continue on Zyrtec 10mg  At bedtime   Continue on Flonase daily  Ventolin  As needed  Wheezing  Follow up with Dr. Elsworth Soho or Dylyn Mclaren NP  in 2-3  months and As needed

## 2019-03-01 NOTE — Assessment & Plan Note (Signed)
Obstructive sleep apnea-patient education was given.  Treatment options were discussed.  Patient would like to proceed with CPAP.  Plan  Patient Instructions  Begin CPAP At bedtime  .  Try to wear for at least 4hr or more each and every night .  Work on healthy weight loss.  Do not drive if sleepy .  Continue on Advair 1 puff Twice daily  ,rinse after use.  Continue on Singulair 10mg  At bedtime   Continue on Zyrtec 10mg  At bedtime   Continue on Flonase daily  Ventolin  As needed  Wheezing  Follow up with Dr. Elsworth Soho or [place] NP  in 2-3  months and As needed

## 2019-03-01 NOTE — Assessment & Plan Note (Signed)
Encouraged on healthy weight loss.  Patient was referred to the weight loss center.  Has not heard from them but currently wants to hold off and try her works weight loss plan

## 2019-03-09 ENCOUNTER — Other Ambulatory Visit: Payer: Self-pay

## 2019-03-12 ENCOUNTER — Ambulatory Visit (INDEPENDENT_AMBULATORY_CARE_PROVIDER_SITE_OTHER): Payer: 59 | Admitting: Gynecology

## 2019-03-12 ENCOUNTER — Other Ambulatory Visit: Payer: Self-pay

## 2019-03-12 ENCOUNTER — Encounter: Payer: Self-pay | Admitting: Gynecology

## 2019-03-12 VITALS — BP 124/80

## 2019-03-12 DIAGNOSIS — R87810 Cervical high risk human papillomavirus (HPV) DNA test positive: Secondary | ICD-10-CM | POA: Diagnosis not present

## 2019-03-12 DIAGNOSIS — N870 Mild cervical dysplasia: Secondary | ICD-10-CM | POA: Diagnosis not present

## 2019-03-12 DIAGNOSIS — R87610 Atypical squamous cells of undetermined significance on cytologic smear of cervix (ASC-US): Secondary | ICD-10-CM

## 2019-03-12 NOTE — Progress Notes (Signed)
    Holly Mcdonald 09-04-1975 [id number]        43 y.o.  G0P0000 presents for colposcopy.  Most recent Pap smear at her annual exam showed ASCUS positive high risk HPV.  She does relate having an abnormal Pap smear a number of years ago and cryosurgery.  Normal Pap smears most recently.  Past medical history,surgical history, problem list, medications, allergies, family history and social history were all reviewed and documented in the EPIC chart.  Directed ROS with pertinent positives and negatives documented in the history of present illness/assessment and plan.  Exam: [name] assistant Vitals:   03/12/19 1430  BP: 124/80   General appearance:  Normal Abdomen soft nontender without gross masses Pelvic external BUS vagina with light vaginal staining.  Cervix normal.  IUD string visualized.  Uterus grossly normal size midline mobile nontender.  Adnexa without masses or tenderness.  Colposcopy performed after acetic acid cleanse was adequate.  Transformation zone visualized 360 degrees.  No abnormalities seen.  ECC was performed with caution to avoid the IUD string.  The patient tolerated well.  Physical Exam  Genitourinary:        Assessment/Plan:  43 y.o. G0P0000 with ASCUS positive high risk HPV Pap smear.  We discussed dysplasia, high-grade/low-grade, progression/regression and the positive HPV screen.  Colposcopy was normal and adequate.  ECC performed.  Patient will follow-up for results.  If normal or low-grade then plan expectant management with follow-up Pap smear/HPV 1 year.  If otherwise then will triage based upon results    Anastasio Auerbach MD, 2:49 PM 03/12/2019

## 2019-03-12 NOTE — Patient Instructions (Signed)
Office will call you with biopsy results 

## 2019-03-12 NOTE — Addendum Note (Signed)
Addended by: Anastasio Auerbach on: 03/12/2019 02:58 PM   Modules accepted: Orders

## 2019-03-14 LAB — TISSUE SPECIMEN

## 2019-03-14 LAB — PATHOLOGY REPORT

## 2019-03-16 ENCOUNTER — Other Ambulatory Visit: Payer: Self-pay

## 2019-03-16 ENCOUNTER — Ambulatory Visit (INDEPENDENT_AMBULATORY_CARE_PROVIDER_SITE_OTHER): Payer: 59 | Admitting: Obstetrics & Gynecology

## 2019-03-16 ENCOUNTER — Telehealth: Payer: Self-pay | Admitting: Obstetrics & Gynecology

## 2019-03-16 ENCOUNTER — Encounter: Payer: Self-pay | Admitting: Obstetrics & Gynecology

## 2019-03-16 ENCOUNTER — Telehealth: Payer: Self-pay | Admitting: *Deleted

## 2019-03-16 VITALS — BP 124/80 | HR 76 | Temp 97.2°F | Resp 12 | Ht 70.0 in | Wt 331.0 lb

## 2019-03-16 DIAGNOSIS — D259 Leiomyoma of uterus, unspecified: Secondary | ICD-10-CM

## 2019-03-16 DIAGNOSIS — N921 Excessive and frequent menstruation with irregular cycle: Secondary | ICD-10-CM

## 2019-03-16 DIAGNOSIS — G4733 Obstructive sleep apnea (adult) (pediatric): Secondary | ICD-10-CM

## 2019-03-16 DIAGNOSIS — M351 Other overlap syndromes: Secondary | ICD-10-CM | POA: Diagnosis not present

## 2019-03-16 DIAGNOSIS — D219 Benign neoplasm of connective and other soft tissue, unspecified: Secondary | ICD-10-CM | POA: Diagnosis not present

## 2019-03-16 DIAGNOSIS — Z862 Personal history of diseases of the blood and blood-forming organs and certain disorders involving the immune mechanism: Secondary | ICD-10-CM

## 2019-03-16 DIAGNOSIS — Z86711 Personal history of pulmonary embolism: Secondary | ICD-10-CM

## 2019-03-16 NOTE — Telephone Encounter (Signed)
Left message to call Sharee Pimple, RN at [facility].    Order placed for [place] IUD for contraceptive

## 2019-03-16 NOTE — Telephone Encounter (Signed)
Spoke with patient. Mirena IUD for contraceptive.   [facility] scheduled for 03/22/19 at 12:30pm, consult at 1pm.   Order placed for precert.   Routing to provider for final review. Patient is agreeable to disposition. Will close encounter.  Cc: Lerry Liner, Magdalene Patricia

## 2019-03-16 NOTE — Telephone Encounter (Signed)
-----   Message from [name], MD sent at 03/16/2019  9:52 AM EST ----- Regarding: ultrasound This patient needs [facility] due to menorrhagia and h/o fibroids.    Holly Mcdonald

## 2019-03-16 NOTE — Telephone Encounter (Signed)
Call placed to convey benefits for Sonohysterogram.

## 2019-03-16 NOTE — Progress Notes (Signed)
43 y.o. G0P0000 Divorced Black or [place] American female here as a new patient to establish care. Patient was seeing Dr. Lemmie Evens at [facility] and transferred care due to him retiring. Patient has hx of menorrhagia, fibroids and abnormal pap smears as well as mixed connective tissue d/o and h/o PE about two years ago.  In regards to the bleeding, pt has undergone hysteroscopic resection of submucosal fibroids x 2.  Each time, bleeding has improved.  Last year, with most recent resection, she also had a Mirena IUD placed.  Bleeding was improved for several months but has gradually worsened again.  Flow is heavy requiring use of megace at times.  Pathology with surgery 12/19 showed benign smooth muscle and benign endometrial tissue.    Pt has been anemic in the past and required iron infusions.  She currently has a hb of 13 and is taking daily iron.  She is followed by Dr. Burr Medico, hematology/oncology.  Hx is complicated by PE diagnosed in 6/18 due to severe chest pain.  Coagulopathy w/u was done and was negative.  Due to persistent chest pain and dyspnea on exertion after PE, she did see Dr. Ellyn Hack in cardiology and coronary CT calcium score was 29 and her risk of CVD was low.    Once she was started on eliquis, cycles became much worse.  Has not been combination OCPs for several years.  Mirena IUD was placed 03/30/2017.  First few cycles improved but then worsened again.  She had a spontaneous expulsion of the IUD 08/10/2017.  Submucosal fibroid measuring 27 x 23mm was noted.  Submucosal fibroid was resected 10/2017.  Depo Provera was given in July 2019 and, again, in October, 2019.  Repeat procedure was performed 04/2018.  She was referred to [facility] prior to this for consideration of hysterectomy.  Repeat hysteroscopic resection with Mirena IUD placement was recommended at that time.    During all of this time, she experience bilateral retinal hemorrhages in 5/19.  Initially Eliquis was stopped.   She did see rheumatology and was diagnosed with auto-immune d/o.  SLE is still possible diagnosis but currently has been diagnosed with mixed connective tissue d/o.  Retinal hemorrhages were thought to be due to autoimmune d/o.  She is on plaquenil and MTX and symptoms are much improved at this time.  She is feeling better, except for the bleeding, than she's felt in months/years.  Just very sick and tired of the bleeding.  Would really like definitive treatment for her bleeding.  Very clearly aware of risks.  Feels she is in a good of condition as she's been in for at least the last 2 1/2 years.  Patient's last menstrual period was 03/05/2019.          Sexually active: Yes.    The current method of family planning is Mirena IUD inserted December 2019.    Exercising: No.  The patient does not participate in regular exercise at present. Smoker:  no  Health Maintenance: Pap:  02/07/19 ASCUS:Pos HR HPV History of abnormal Pap:  Yes.  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   MMG:  03/04/18 BIRADS 1 negative -- scheduled tomorrow at [facility] Colonoscopy:  n/a BMD:   n/a TDaP:  2014 Pneumonia vaccine(s):  Pneumovax 11/04/16 Shingrix:   n/a Hep C testing: 02/07/19 Negative Screening Labs: patient had labs done with previous GYN 02/07/19   reports that she has never smoked. She has never used smokeless tobacco. She reports  current alcohol use. She reports that she does not use drugs.  Past Medical History:  Diagnosis Date  . Anticoagulated    eliquis  . Arthritis    inflammatory  . Arthritis   . 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 Bladder spasm   . Connective tissue disease ([place]) dx oct 2019  . Fibromyalgia   . GERD (gastroesophageal reflux disease)   . Heavy menstrual bleeding    resolved with megace  . History of chronic bronchitis    last flare up 11-18-17  . History of pulmonary embolism 10/2016--- followed by pcp and pulmoloigst   dx bilateral PE  , bilateral lower lobes , segmental , unprovoked, negative hypercoagulable work-up-- treated w/ eliquis  . Immune deficiency disorder ([place])   . Iron deficiency anemia   . Migraine   . Mild intermittent asthma    pulmologist-  dr Elsworth Soho  . Mixed connective tissue disease ([place])   . Morbid obesity with BMI of 45.0-49.9, adult ([place])   . Open-angle glaucoma of both eyes   . Pre-diabetes   . 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 Urgency of urination   . Uterine fibroid   . Vitamin D deficiency   . Wears glasses     Past Surgical History:  Procedure Laterality Date  . ANAL EXAMINATION UNDER ANESTHESIA  age 52   w/ removal cyst  . DILATATION & CURETTAGE/HYSTEROSCOPY WITH MYOSURE N/A 10/17/2017   Procedure: [place];  Surgeon: Anastasio Auerbach, MD;  Location: [place];  Service: Gynecology;  Laterality: N/A;  . DILATATION & CURETTAGE/HYSTEROSCOPY WITH MYOSURE N/A 04/11/2018   Procedure: DILATATION & CURETTAGE/HYSTEROSCOPY WITH MYOSURE;  Surgeon: Anastasio Auerbach, MD;  Location: [place];  Service: Gynecology;  Laterality: N/A;  . GYNECOLOGIC CRYOSURGERY  age 62s  . INTRAUTERINE DEVICE INSERTION  04/18/2018  . TONSILLECTOMY  age 75  . TRANSTHORACIC ECHOCARDIOGRAM  01/17/2017   ef 60-65%/  mild TR    Current Outpatient Medications  Medication Sig Dispense Refill  . ADVAIR DISKUS 250-50 MCG/DOSE AEPB INHALE 1 PUFF INTO THE LUNGS TWICE DAILY AS NEEDED 60 each 5  . albuterol (PROVENTIL) (2.5 MG/3ML) 0.083% nebulizer solution Take 3 mLs (2.5 mg total) by nebulization every 6 (six) hours as needed for wheezing or shortness of breath. 360 mL 5  . albuterol (VENTOLIN HFA) 108 (90 Base) MCG/ACT inhaler Inhale 2 puffs into the lungs every 6 (six) hours as needed. 18 g 0  . baclofen (LIORESAL) 10 MG tablet TAKE 1 TABLET BY MOUTH THREE TIMES DAILY AS NEEDED FOR MUSCLE SPASMS 90 tablet 5  .  benzonatate (TESSALON) 200 MG capsule Take 1 capsule (200 mg total) by mouth 3 (three) times daily as needed for cough. 30 capsule 1  . cetirizine (ZYRTEC) 10 MG tablet Take 1 tablet (10 mg total) by mouth every morning. 30 tablet 5  . Cyanocobalamin (VITAMIN B 12 PO) Take 1,000 mg by mouth daily.     . DULoxetine (CYMBALTA) 60 MG capsule TAKE 1 CAPSULE(60 MG) BY MOUTH DAILY 30 capsule 2  . ELIQUIS 5 MG TABS tablet TAKE 1 TABLET(5 MG) BY MOUTH TWICE DAILY 60 tablet 5  . FEROSUL 325 (65 Fe) MG tablet TAKE 3 TABLETS(975 MG) BY MOUTH EVERY MORNING 90 tablet 5  . fluticasone (FLONASE) 50 MCG/ACT nasal spray SHAKE LIQUID AND USE 1 SPRAY IN EACH NOSTRIL DAILY--  in am 16 g 2  .  folic acid (FOLVITE) 1 MG tablet Take 1 mg by mouth 2 (two) times daily.     . hydroxychloroquine (PLAQUENIL) 200 MG tablet Take 200 mg by mouth daily.    . megestrol (MEGACE) 20 MG tablet Take one tablet by mouth twice daily for 7 days then daily as needed for bleeding 30 tablet 0  . methocarbamol (ROBAXIN) 500 MG tablet Take 1 tablet (500 mg total) by mouth 2 (two) times daily as needed for muscle spasms. 60 tablet 1  . methotrexate 2.5 MG tablet Take 15 mg by mouth once a week.     . montelukast (SINGULAIR) 10 MG tablet Take 1 tablet (10 mg total) by mouth at bedtime. 30 tablet 5  . omeprazole (PRILOSEC) 40 MG capsule TAKE 1 CAPSULE(40 MG) BY MOUTH DAILY 30 capsule 5  . oxybutynin (DITROPAN) 5 MG tablet Take 1 tablet by mouth 2 (two) times daily.  11  . pregabalin (LYRICA) 75 MG capsule TAKE 1 CAPSULE BY MOUTH THREE TIMES DAILY 90 capsule 3  . timolol (TIMOPTIC) 0.5 % ophthalmic solution Place 1 drop into both eyes every morning.    . topiramate (TOPAMAX) 25 MG tablet TK 3 TS PO D----- per pt takes 3 tablets qhs  1  . Vitamin D, Ergocalciferol, (DRISDOL) 1.25 MG (50000 UT) CAPS capsule TAKE 1 CAPSULE BY MOUTH EVERY 10 DAYS 10 capsule 1   No current facility-administered medications for this visit.     Family History   Problem Relation Age of Onset  . Pulmonary embolism Mother        Last year  . Mental retardation Mother        PTSD and fibromyalgia  . Heart disease Mother        CHF  . Congestive Heart Failure Mother   . Heart disease Father 72       CAD  . Hyperlipidemia Father   . Diabetes Paternal Grandmother   . Heart attack Paternal Grandmother   . Congestive Heart Failure Maternal Aunt   . Cancer Neg Hx     Review of Systems  Constitutional: Negative.   HENT: Negative.   Eyes: Negative.   Respiratory: Negative.   Cardiovascular: Negative.   Gastrointestinal: Negative.   Endocrine: Negative.   Genitourinary: Positive for vaginal bleeding.  Musculoskeletal: Negative.   Skin: Negative.   Allergic/Immunologic: Negative.   Neurological: Negative.   Hematological: Negative.   Psychiatric/Behavioral: Negative.     Exam:   BP 124/80 (BP Location: Left Arm, Patient Position: Sitting, Cuff Size: Large)   Pulse 76   Temp (!) 97.2 F (36.2 C) (Temporal)   Resp 12   Ht 5\' 10"  (1.778 m)   Wt (!) 331 lb (150.1 kg)   LMP 03/05/2019   BMI 47.49 kg/m    Height: 5\' 10"  (177.8 cm)  Ht Readings from Last 3 Encounters:  03/16/19 5\' 10"  (1.778 m)  03/01/19 5\' 10"  (1.778 m)  02/07/19 5\' 10"  (1.778 m)    General appearance: alert, cooperative and appears stated age No exam performed today  A:  Menorrhagia H/o iron deficiently anemia Fibroid uterus Morbid obesity H/o PE 2018 OSA, CPAP has been ordered Current Mirena IUD use H/O mixed connective tissue d/o  P:   Pt is going to return for [facility] to evaluate for submucosal fibroid again. Options for treatment discussed including endometrial ablation (which I do not think is a good idea considering size of prior fibroids on previous ultrasound), resection of fibroid if appropriate, uterine  artery embolization (information provided), and hysterectomy.  She is most interested in definitive treatment.  I think she really does have a good  understanding of her risks however I need to do a thorough review of chart/records as well as getting input from hematology and rheumatology.  Would not want to do open procedure on this pt so may need ophthalmology input at well for positioning.  She voices clear understanding of the above and will do some research on [place].    ~45 minutes spent with pt in face to face discussion

## 2019-03-17 ENCOUNTER — Encounter: Payer: Self-pay | Admitting: Family Medicine

## 2019-03-19 ENCOUNTER — Telehealth: Payer: Self-pay | Admitting: Obstetrics & Gynecology

## 2019-03-19 NOTE — Telephone Encounter (Signed)
Call placed to patient to review benefit for scheduled sonohysterogram appointment. Left voicemail message requesting a return call.

## 2019-03-19 NOTE — Telephone Encounter (Signed)
Patient returning call to [facility]. States she is aware of her benefits and does not need return call. Notified patient $0 responsibility for ultrasound 03/22/19.

## 2019-03-20 ENCOUNTER — Other Ambulatory Visit: Payer: Self-pay

## 2019-03-22 ENCOUNTER — Other Ambulatory Visit: Payer: Self-pay | Admitting: Obstetrics & Gynecology

## 2019-03-22 ENCOUNTER — Other Ambulatory Visit: Payer: Self-pay

## 2019-03-22 ENCOUNTER — Ambulatory Visit (INDEPENDENT_AMBULATORY_CARE_PROVIDER_SITE_OTHER): Payer: 59

## 2019-03-22 ENCOUNTER — Encounter: Payer: Self-pay | Admitting: Obstetrics & Gynecology

## 2019-03-22 ENCOUNTER — Ambulatory Visit (INDEPENDENT_AMBULATORY_CARE_PROVIDER_SITE_OTHER): Payer: 59 | Admitting: Obstetrics & Gynecology

## 2019-03-22 VITALS — BP 142/80 | HR 80 | Temp 97.4°F | Ht 70.0 in | Wt 335.0 lb

## 2019-03-22 DIAGNOSIS — D259 Leiomyoma of uterus, unspecified: Secondary | ICD-10-CM

## 2019-03-22 DIAGNOSIS — N921 Excessive and frequent menstruation with irregular cycle: Secondary | ICD-10-CM | POA: Diagnosis not present

## 2019-03-22 DIAGNOSIS — Z86711 Personal history of pulmonary embolism: Secondary | ICD-10-CM | POA: Diagnosis not present

## 2019-03-22 DIAGNOSIS — R87610 Atypical squamous cells of undetermined significance on cytologic smear of cervix (ASC-US): Secondary | ICD-10-CM

## 2019-03-22 DIAGNOSIS — M351 Other overlap syndromes: Secondary | ICD-10-CM | POA: Diagnosis not present

## 2019-03-22 DIAGNOSIS — Z862 Personal history of diseases of the blood and blood-forming organs and certain disorders involving the immune mechanism: Secondary | ICD-10-CM

## 2019-03-22 DIAGNOSIS — R87810 Cervical high risk human papillomavirus (HPV) DNA test positive: Secondary | ICD-10-CM

## 2019-03-22 DIAGNOSIS — G4733 Obstructive sleep apnea (adult) (pediatric): Secondary | ICD-10-CM | POA: Diagnosis not present

## 2019-03-22 NOTE — Progress Notes (Signed)
43 y.o. Divorced [place] American female here for a pelvic ultrasound with sonohystogram due to worsening menorrhagia.  she has a hx of uterine fibroids, s/p hysteroscopic resection of submucosal fibroids x 2 as well as expulsion of a mirena IUD and now has a second Mirena IUD in place.  She also has received several iron infusions in the past.    She has a complicated medical hx significant for pulmonary emboli >2 years ago as well as mixed connective tissue d/o that may ultimately turn out to be lupus.  She is followed by hematology, pulmonology, rheumatology, ophthalmology.    Patient's last menstrual period was 03/05/2019.  Contraception:  Mirena IUD  Technique:  Both transabdominal and transvaginal ultrasound examinations of the pelvis were performed. Transabdominal technique was performed for global imaging of the pelvis including uterus, ovaries, adnexal regions, and pelvic cul-de-sac.  It was necessary to proceed with endovaginal exam following the abdominal ultrasound transabdominal exam to visualize the endometrium and adnexa.  Color and duplex Doppler ultrasound was utilized to evaluate blood flow to the ovaries.   FINDINGS: Uterus: 11.1 x 8.5 x 6.5cm.  Multiple fibroids present measuing 2.9cm, 2.0cm, 2.1cm, 2.3cm, 3.0cm., 4.8cm and 1.0cm Endometrium: 4.19mm with Mirena IUD in place Adnexa:  Left: 2.2 x 1.1 x [id number] with follicle noted     Right:  3.0 x 1.8 x [id number] with follicle noted Cul de sac: no free fluid  SHSG:  After obtaining appropriate verbal consent from patient, the cervix was visualized using a speculum, and prepped with betadine.  A tenaculum  was not applied to the cervix.  Dilation of the cervix was not necessary. The catheter was passed into the uterus and sterile saline introduced, with the following findings: no intracavitary lesion but IUD is in correct location  Discussion:   Pt is desirous of definitive procedure.  There is time available the Monday after  Thanksgiving so this will be requested.  She is aware that I need to communicate with hematology, pulmonology rheumatology and ophthalmology to ensure all are ok with surgery, to make sure there are no additional recommendations and to ensure that she can tolerated the Trendelenburg positioning.  Pt and I did discussed uterine artery embolization as well.  She has done some research and would just like to be done with bleeding.   She and I have a very frank discussion about her risks, specifically for clotting.  She will need transitioning from Eliquis to Lovenox the day of surgery and for 14 days post op, followed by transitioning back to Eliquis.  She understands this increases the risk of bleeding and of post op bleeding.    Procedure discussed in depth with patient.  She is also aware that I do not think an open procedure would be good for her at this time.  If  After starting the laparoscopy, I thought this could not be accomplished, I would not start the hysterectomy and just stop and proceed with [place].   Hospital stay, recovery and pain management all discussed.  Risks discussed including but not limited to bleeding, 1% risk of receiving a  transfusion, infection, 3-4% risk of bowel/bladder/ureteral/vascular injury discussed as well as possible need for additional surgery if injury does occur discussed.  DVT/PE and rare risk of death discussed.  My actual complications with prior surgeries discussed.  Vaginal cuff dehiscence discussed.  Hernia formation discussed.  Positioning and incision locations discussed.  Patient aware if pathology abnormal she may need additional treatment.  All questions  answered.    Physical Exam  Constitutional: She is oriented to person, place, and time. She appears well-developed and well-nourished.  Cardiovascular: Normal rate and regular rhythm.  Respiratory: Effort normal and breath sounds normal.  GI: Soft. Bowel sounds are normal.  Genitourinary:    Vagina normal.   There is no rash, tenderness or lesion on the right labia. There is no rash, tenderness, lesion or injury on the left labia. Uterus is enlarged (about 12 weeks but mobile, globular c/w fibroids). Cervix exhibits no motion tenderness, no discharge and no friability. Right adnexum displays no mass, no tenderness and no fullness. Left adnexum displays no mass, no tenderness and no fullness.  Lymphadenopathy:       Right: No inguinal adenopathy present.       Left: No inguinal adenopathy present.  Neurological: She is alert and oriented to person, place, and time.  Skin: Skin is warm and dry.  Psychiatric: She has a normal mood and affect.   Assessment: Menorrhagia H/o iron deficiency anemia H/o mixed connective tissue d/o Obesity OSA Uterine fibroids H/o PE  Plan:   Will proceed with scheduling surgery and also with precert for surgery.  I will reach out to her other providers to ensure clearance is/is not needed and to make sure no other recommendations need to be managed prior to surgery.  ~40 minutes spent with patient >50% of time was in face to face discussion of above.

## 2019-03-31 ENCOUNTER — Other Ambulatory Visit: Payer: Self-pay | Admitting: Obstetrics & Gynecology

## 2019-04-01 ENCOUNTER — Other Ambulatory Visit: Payer: Self-pay | Admitting: Hematology

## 2019-04-02 ENCOUNTER — Telehealth: Payer: Self-pay | Admitting: *Deleted

## 2019-04-02 NOTE — Telephone Encounter (Signed)
Call to patient. Left message to call back.  

## 2019-04-02 NOTE — Telephone Encounter (Signed)
-----   Message from [name], MD sent at 03/31/2019  7:10 AM EST ----- Regarding: RE: Surgery orders Gay Filler, Can you please let her know I spoke/communicated personally with all four of the providers below.  Could you put the following in a phone note?  Dr. Kathlene November, rheumatology, recommended stopping her methotrexate about 1 week prior to surgery and staying off for about 10 days post op.  He does not need to see her.  Dr. Idolina Primer, ophthalmology, felt she was safe for the [facility] including the positioning I will need.  He does not need to see her.  Dr. Elsworth Soho, pulmonology, recommended she use her CPAP post op so she should bring it with her.  He also recommended the prolonged DVT/PE prophylaxis.  He does not need to see her.  Dr. Burr Medico, hematology, agreed with surgery and felt this would make the Eliquis safer for her in the long run.  She needs to stop this three full days prior to surgery.  Thursday should be her last dose.  She does not need to see her.  I will need to use Lovenox for 14 days post op.  Thank you.  Surgical orders have been placed.    Vinnie Level ----- Message ----- From: Huey Romans, RN Sent: 03/23/2019   3:13 PM EST To: [name], MD Subject: Surgery orders                                 TLH/BS/cysto scheduled for 04-09-19-     #0700 @  [facility]  Pre-op completed on 03-22-19. Advised pending review with her team, may need to return to - we will advise if needed.   Rheumatology DR Lahoma Rocker  [phone]  Ophthalmology Dr Alois Cliche [phone]  Hematology  Dr Truitt Merle  [phone]  She will need surgery orders.  Please advise if I need to work on post-op Lovenox.

## 2019-04-02 NOTE — Telephone Encounter (Signed)
Patient returning call to [facility]. States she will call again on her next break.

## 2019-04-02 NOTE — Telephone Encounter (Signed)
Call from patient. Notified of all instructions as directed by Dr Sabra Heck. Patient verbalized understanding. Encounter closed.

## 2019-04-03 ENCOUNTER — Other Ambulatory Visit: Payer: Self-pay | Admitting: Obstetrics & Gynecology

## 2019-04-03 MED ORDER — ENOXAPARIN SODIUM 40 MG/0.4ML ~~LOC~~ SOLN: SUBCUTANEOUS | 0 refills | Status: DC

## 2019-04-03 NOTE — Progress Notes (Signed)
[facility] [place] [address] Phone: [phone] Fax: [place] [place] [address] [place] [address] Phone: [phone] Fax: [phone]      Your procedure is scheduled on Monday, Nov. 30th.  Report to [facility] Main Entrance "A" at 5:30 A.M., and check in at the Admitting office.  Call this number if you have problems the morning of surgery:  [phone]  Call [phone] if you have any questions prior to your surgery date Monday-Friday 8am-4pm    Remember:  Do not eat after midnight the night before your surgery  You may drink clear liquids until 4:15 AM the morning of your surgery.   Clear liquids allowed are: Water, Non-Citrus Juices (without pulp), Carbonated Beverages, Clear Tea, Black Coffee Only, and Gatorade    Take these medicines the morning of surgery with A SIP OF WATER   Advair inhaler - if needed  Albuterol Inhaler - if needed  Zyrtec - if needed  Flonase - if needed  Omeprazole (prilosec) - if needed  Eye drops - if needed    7 days prior to surgery STOP taking any Aspirin (unless otherwise instructed by your surgeon), Aleve, Naproxen, Ibuprofen, Motrin, Advil, Goody's, BC's, all herbal medications, fish oil, and all vitamins.    The Morning of Surgery  Do not wear jewelry, make-up or nail polish.  Do not wear lotions, powders, or perfumes, or deodorant  Do not shave 48 hours prior to surgery.    Do not bring valuables to the hospital.  [facility] is not responsible for any belongings or valuables.  If you are a smoker, DO NOT Smoke 24 hours prior to surgery  If you wear a CPAP at night please bring your mask, tubing, and machine the morning of surgery   Remember that you must have someone to transport you home after your surgery, and remain with you  for 24 hours if you are discharged the same day.   Please bring cases for contacts, glasses, hearing aids, dentures or bridgework because it cannot be worn into surgery.    Leave your suitcase in the car.  After surgery it may be brought to your room.  For patients admitted to the hospital, discharge time will be determined by your treatment team.  Patients discharged the day of surgery will not be allowed to drive home.    Special instructions:   [facility]- Preparing For Surgery  Before surgery, you can play an important role. Because skin is not sterile, your skin needs to be as free of germs as possible. You can reduce the number of germs on your skin by washing with CHG (chlorahexidine gluconate) Soap before surgery.  CHG is an antiseptic cleaner which kills germs and bonds with the skin to continue killing germs even after washing.    Oral Hygiene is also important to reduce your risk of infection.  Remember - BRUSH YOUR TEETH THE MORNING OF SURGERY WITH YOUR REGULAR TOOTHPASTE  Please do not use if you have an allergy to CHG or antibacterial soaps. If your skin becomes reddened/irritated stop using the CHG.  Do not shave (including legs and underarms) for at least 48 hours prior to first CHG shower. It is OK to shave your face.  Please follow these instructions carefully.   1.  Shower the NIGHT BEFORE SURGERY and the MORNING OF SURGERY with CHG Soap.   2. If you chose to wash your hair, wash your hair first as usual with your normal shampoo.  3. After you shampoo, rinse your hair and body thoroughly to remove the shampoo.  4. Use CHG as you would any other liquid soap. You can apply CHG directly to the skin and wash gently with a scrungie or a clean washcloth.   5. Apply the CHG Soap to your body ONLY FROM THE NECK DOWN.  Do not use on open wounds or open sores. Avoid contact with your eyes, ears, mouth and genitals (private parts). Wash Face and genitals (private parts)  with  your normal soap.   6. Wash thoroughly, paying special attention to the area where your surgery will be performed.  7. Thoroughly rinse your body with warm water from the neck down.  8. DO NOT shower/wash with your normal soap after using and rinsing off the CHG Soap.  9. Pat yourself dry with a CLEAN TOWEL.  10. Wear CLEAN PAJAMAS to bed the night before surgery, wear comfortable clothes the morning of surgery  11. Place CLEAN SHEETS on your bed the night of your first shower and DO NOT SLEEP WITH PETS.    Day of Surgery:  Please shower the morning of surgery with the CHG soap Do not apply any deodorants/lotions. Please wear clean clothes to the hospital/surgery center.   Remember to brush your teeth WITH YOUR REGULAR TOOTHPASTE.   Please read over the following fact sheets that you were given.

## 2019-04-03 NOTE — Telephone Encounter (Signed)
Call to patient. Advised Lovenox prescription ( for post-op use) has been sent to pharmacy by Dr Sabra Heck as requested.

## 2019-04-04 ENCOUNTER — Other Ambulatory Visit: Payer: Self-pay

## 2019-04-04 ENCOUNTER — Encounter (HOSPITAL_COMMUNITY)
Admission: RE | Admit: 2019-04-04 | Discharge: 2019-04-04 | Disposition: A | Discharge status: Home / Self Care | Payer: 59 | Source: Ambulatory Visit | Attending: Obstetrics & Gynecology | Admitting: Obstetrics & Gynecology

## 2019-04-04 ENCOUNTER — Telehealth: Payer: Self-pay | Admitting: Obstetrics & Gynecology

## 2019-04-04 ENCOUNTER — Encounter (HOSPITAL_COMMUNITY): Payer: Self-pay

## 2019-04-04 DIAGNOSIS — G4733 Obstructive sleep apnea (adult) (pediatric): Secondary | ICD-10-CM | POA: Insufficient documentation

## 2019-04-04 DIAGNOSIS — N920 Excessive and frequent menstruation with regular cycle: Secondary | ICD-10-CM | POA: Insufficient documentation

## 2019-04-04 DIAGNOSIS — Z01812 Encounter for preprocedural laboratory examination: Secondary | ICD-10-CM | POA: Insufficient documentation

## 2019-04-04 DIAGNOSIS — I071 Rheumatic tricuspid insufficiency: Secondary | ICD-10-CM | POA: Diagnosis not present

## 2019-04-04 DIAGNOSIS — Z86711 Personal history of pulmonary embolism: Secondary | ICD-10-CM | POA: Diagnosis not present

## 2019-04-04 DIAGNOSIS — Z7901 Long term (current) use of anticoagulants: Secondary | ICD-10-CM | POA: Diagnosis not present

## 2019-04-04 DIAGNOSIS — D649 Anemia, unspecified: Secondary | ICD-10-CM | POA: Diagnosis not present

## 2019-04-04 HISTORY — DX: Sleep apnea, unspecified: G47.30

## 2019-04-04 LAB — CBC
HCT: 40.6 % (ref 36.0–46.0)
Hemoglobin: 13.5 g/dL (ref 12.0–15.0)
MCH: 30.6 pg (ref 26.0–34.0)
MCHC: 33.3 g/dL (ref 30.0–36.0)
MCV: 92.1 fL (ref 80.0–100.0)
Platelets: 225 10*3/uL (ref 150–400)
RBC: 4.41 MIL/uL (ref 3.87–5.11)
RDW: 12.6 % (ref 11.5–15.5)
WBC: 7.9 10*3/uL (ref 4.0–10.5)
nRBC: 0 % (ref 0.0–0.2)

## 2019-04-04 LAB — TYPE AND SCREEN
ABO/RH(D): A POS
Antibody Screen: NEGATIVE

## 2019-04-04 LAB — BASIC METABOLIC PANEL
Anion gap: 8 (ref 5–15)
BUN: 10 mg/dL (ref 6–20)
CO2: 21 mmol/L — ABNORMAL LOW (ref 22–32)
Calcium: 9.1 mg/dL (ref 8.9–10.3)
Chloride: 108 mmol/L (ref 98–111)
Creatinine, Ser: 0.75 mg/dL (ref 0.44–1.00)
GFR calc Af Amer: >60 mL/min (ref >60)
GFR calc non Af Amer: >60 mL/min (ref >60)
Glucose, Bld: 107 mg/dL — ABNORMAL HIGH (ref 70–99)
Potassium: 3.8 mmol/L (ref 3.5–5.1)
Sodium: 137 mmol/L (ref 135–145)

## 2019-04-04 LAB — ABO/RH: ABO/RH(D): A POS

## 2019-04-04 LAB — GLUCOSE, CAPILLARY: Glucose-Capillary: 100 mg/dL — ABNORMAL HIGH (ref 70–99)

## 2019-04-04 NOTE — Telephone Encounter (Signed)
Spoke with pharmacy at [facility] and verified Rx before calling pt. Pharmacy verified pt has 14 days and 14 doses ready and to disregard text from pharmacy.   Called pt and verified pts Rx of 14 days and 14 doses and is ready for pick up. Pt grateful for clarification.   Will route to Dr Sabra Heck for review and will close encounter.

## 2019-04-04 NOTE — Progress Notes (Signed)
PCP - Dr. [name] Cardiologist - Dr. Ellyn Hack: initially seen for baseline when diagnosed with PE.  Released by cardiology and no longer being followed Neurologist - Dr. Domingo [facility] Hematologist - Dr. Burr Medico Rheumatologist - Dr. Kathlene November Pulmonologist - Dr. Elsworth Soho Olpthamologist - Dr. Alois Cliche  PPM/ICD - n/a Device Orders -  Rep Notified -   Chest x-ray - n/a EKG - n/a Stress Test - patient denies ECHO - 01/17/2017 Cardiac Cath - patient denies  Sleep Study - 02/14/2019 CPAP - yes, instructed to bring the day of surgery  Fasting Blood Sugar - n/a (Patient has pre-diabetes) Checks Blood Sugar _____ times a day  Blood Thinner Instructions: last dose of eliquis 04/05/2019 Aspirin Instructions: Last dose of Methotrexate - 03/30/2019  ERAS Protcol - clears until 0415 DOS PRE-SURGERY Ensure or G2- none ordered  COVID TEST- scheduled for Friday 04/06/2019   Anesthesia review: yes  Patient denies shortness of breath, fever, cough and chest pain at PAT appointment   All instructions explained to the patient, with a verbal understanding of the material. Patient agrees to go over the instructions while at home for a better understanding. Patient also instructed to self quarantine after being tested for COVID-19. The opportunity to ask questions was provided.

## 2019-04-04 NOTE — Telephone Encounter (Signed)
Patient received a text  from her pharmacy stating her Lovenox  was to be taken for 10 days and only a 10 supply was authorized. She was under the impression she was to take this for 14 days. She said you may leave the details on her voicemail.

## 2019-04-04 NOTE — Progress Notes (Signed)
[facility], [place] [address] Phone: [phone] Fax: [place] [place] [address] [place] [address] Phone: [phone] Fax: [phone]      Your procedure is scheduled on Monday, Nov. 30th.  Report to [facility] Main Entrance "A" at 5:30 A.M., and check in at the Admitting office.  Call this number if you have problems the morning of surgery:  [phone]  Call [phone] if you have any questions prior to your surgery date Monday-Friday 8am-4pm    Remember:  Do not eat after midnight the night before your surgery  You may drink clear liquids until 4:15 AM the morning of your surgery.   Clear liquids allowed are: Water, Non-Citrus Juices (without pulp), Carbonated Beverages, Clear Tea, Black Coffee Only, and Gatorade    Take these medicines the morning of surgery with A SIP OF WATER   Advair inhaler - if needed  Albuterol Inhaler - if needed  Baclofen (Lioresal) - if needed  Benzonatate (Tessalon) - if needed  Cetirizine (Zyrtec) - if needed  Fluticasone (Flonase) - if needed  Methocarbamol (Robaxin) - if needed  Omeprazole (prilosec) - if needed  Pregabalin (Lyrica) - if needed  Eye drops - if needed    7 days prior to surgery STOP taking any Aspirin (unless otherwise instructed by your surgeon), Aleve, Naproxen, Ibuprofen, Motrin, Advil, Goody's, BC's, all herbal medications, fish oil, and all vitamins.    The Morning of Surgery  Do not wear jewelry, make-up or nail polish.  Do not wear lotions, powders, or perfumes, or deodorant  Do not shave 48 hours prior to surgery.    Do not bring valuables to the hospital.  [facility] is not responsible for any belongings or valuables.  If you are a smoker, DO NOT Smoke 24 hours prior to surgery  If you wear a CPAP at night  please bring your mask, tubing, and machine the morning of surgery   Remember that you must have someone to transport you home after your surgery, and remain with you for 24 hours if you are discharged the same day.   Please bring cases for contacts, glasses, hearing aids, dentures or bridgework because it cannot be worn into surgery.    Leave your suitcase in the car.  After surgery it may be brought to your room.  For patients admitted to the hospital, discharge time will be determined by your treatment team.  Patients discharged the day of surgery will not be allowed to drive home.    Special instructions:   [place]- Preparing For Surgery  Before surgery, you can play an important role. Because skin is not sterile, your skin needs to be as free of germs as possible. You can reduce the number of germs on your skin by washing with CHG (chlorahexidine gluconate) Soap before surgery.  CHG is an antiseptic cleaner which kills germs and bonds with the skin to continue killing germs even after washing.    Oral Hygiene is also important to reduce your risk of infection.  Remember - BRUSH YOUR TEETH THE MORNING OF SURGERY WITH YOUR REGULAR TOOTHPASTE  Please do not use if you have an allergy to CHG or antibacterial soaps. If your skin becomes reddened/irritated stop using the CHG.  Do not shave (including legs and underarms)  for at least 48 hours prior to first CHG shower. It is OK to shave your face.  Please follow these instructions carefully.   1. Shower the NIGHT BEFORE SURGERY and the MORNING OF SURGERY with CHG Soap.   2. If you chose to wash your hair, wash your hair first as usual with your normal shampoo.  3. After you shampoo, rinse your hair and body thoroughly to remove the shampoo.  4. Use CHG as you would any other liquid soap. You can apply CHG directly to the skin and wash gently with a scrungie or a clean washcloth.   5. Apply the CHG Soap to your body ONLY FROM THE  NECK DOWN.  Do not use on open wounds or open sores. Avoid contact with your eyes, ears, mouth and genitals (private parts). Wash Face and genitals (private parts)  with your normal soap.   6. Wash thoroughly, paying special attention to the area where your surgery will be performed.  7. Thoroughly rinse your body with warm water from the neck down.  8. DO NOT shower/wash with your normal soap after using and rinsing off the CHG Soap.  9. Pat yourself dry with a CLEAN TOWEL.  10. Wear CLEAN PAJAMAS to bed the night before surgery, wear comfortable clothes the morning of surgery  11. Place CLEAN SHEETS on your bed the night of your first shower and DO NOT SLEEP WITH PETS.    Day of Surgery:  Please shower the morning of surgery with the CHG soap Do not apply any deodorants/lotions. Please wear clean clothes to the hospital/surgery center.   Remember to brush your teeth WITH YOUR REGULAR TOOTHPASTE.   Please read over the following fact sheets that you were given.

## 2019-04-06 ENCOUNTER — Other Ambulatory Visit (HOSPITAL_COMMUNITY)
Admission: RE | Admit: 2019-04-06 | Discharge: 2019-04-06 | Disposition: A | Discharge status: Home / Self Care | Payer: 59 | Source: Ambulatory Visit | Attending: Obstetrics & Gynecology | Admitting: Obstetrics & Gynecology

## 2019-04-06 DIAGNOSIS — Z01812 Encounter for preprocedural laboratory examination: Secondary | ICD-10-CM | POA: Insufficient documentation

## 2019-04-06 DIAGNOSIS — Z20828 Contact with and (suspected) exposure to other viral communicable diseases: Secondary | ICD-10-CM | POA: Diagnosis not present

## 2019-04-06 LAB — SARS CORONAVIRUS 2 (TAT 6-24 HRS): SARS Coronavirus 2: NEGATIVE

## 2019-04-06 MED ORDER — DEXTROSE 5 % IV SOLN
3.0000 g | INTRAVENOUS | Status: AC
  Administered 2019-04-09: 3 g via INTRAVENOUS
  Filled 2019-04-06: qty 3

## 2019-04-06 NOTE — Progress Notes (Signed)
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 Pt sees multiple specialists due to her medical problems. Dr. Hale Bogus corresponded with all pertinent treating physicians regarding surgery. Per telephone encounter 04/02/19 "Dr. Kathlene November, rheumatology, recommended stopping her methotrexate about 1 week prior to surgery and staying off for about 10 days post op.  He does not need to see her. Dr. Idolina Primer, ophthalmology, felt she was safe for the [facility] including the positioning I will need.  He does not need to see her. Dr. Elsworth Soho, pulmonology, recommended she use her CPAP post op so she should bring it with her.  He also recommended the prolonged DVT/PE prophylaxis.  He does not need to see her. Dr. Burr Medico, hematology, agreed with surgery and felt this would make the Eliquis safer for her in the long run.  She needs to stop this three full days prior to surgery.  Thursday should be her last dose.  She does not need to see her. Will need to use Lovenox for 14 days post op."  Pt was evaluated by cardiology after PE in 2018. She had an echo showing normal LV and RV size and function and mild MVP without regurgitation. Low risk coronary calcium score and coronary CT angiogram. Coronary calcium score was 29.  Last seen by Dr. Ellyn Hack 04/04/17 and advised to followup PRN.    Preop labs reviewed, WNL.  EKG 06/27/18 NSR. Left axis deviation. Rate 90. Low voltage QRS. Inferior infarct , age undetermined. Possible Anterolateral infarct , age undetermined. No significant change since last tracing  TTE 01/17/17: - Left ventricle: The cavity size was normal. Systolic function was   normal. The estimated ejection fraction was in the range of 60%   to 65%. Wall motion was normal; there were no regional wall   motion abnormalities. Left ventricular diastolic function   parameters were normal. - Aortic valve: Transvalvular velocity was  within the normal range.   There was no stenosis. There was no regurgitation. - Mitral valve: Calcified annulus. Mild prolapse, involving the   anterior leaflet and the posterior leaflet. Transvalvular   velocity was within the normal range. There was no evidence for   stenosis. There was no regurgitation. - Right ventricle: The cavity size was normal. Wall thickness was   normal. Systolic function was normal. - Tricuspid valve: There was mild regurgitation. - Pulmonary arteries: Systolic pressure was within the normal   range. PA peak pressure: 20 mm Hg (S).  CT cardiac 01/17/17: IMPRESSION: Coronary calcium score of 29 . This was 97th percentile for age and sex matched control.   Wynonia Musty [facility] Short Stay Center/Anesthesiology Phone [phone] 04/06/2019 8:57 AM

## 2019-04-06 NOTE — Anesthesia Preprocedure Evaluation (Addendum)
Anesthesia Evaluation  Patient identified by MRN, date of birth, ID band Patient awake    Reviewed: Allergy & Precautions, NPO status , Patient's Chart, lab work & pertinent test results  Airway Mallampati: III  TM Distance: >3 FB Neck ROM: Full    Dental  (+) Dental Advisory Given, Chipped,    Pulmonary asthma , sleep apnea ,    breath sounds clear to auscultation       Cardiovascular negative cardio ROS   Rhythm:Regular Rate:Normal     Neuro/Psych  Headaches, PSYCHIATRIC DISORDERS Anxiety    GI/Hepatic Neg liver ROS, GERD  Medicated,  Endo/Other  negative endocrine ROS  Renal/GU negative Renal ROS     Musculoskeletal  (+) Arthritis , Fibromyalgia -  Abdominal (+) + obese,   Peds  Hematology negative hematology ROS (+)   Anesthesia Other Findings   Reproductive/Obstetrics                            Lab Results  Component Value Date   WBC 7.9 04/04/2019   HGB 13.5 04/04/2019   HCT 40.6 04/04/2019   MCV 92.1 04/04/2019   PLT 225 04/04/2019   Lab Results  Component Value Date   CREATININE 0.75 04/04/2019   BUN 10 04/04/2019   NA 137 04/04/2019   K 3.8 04/04/2019   CL 108 04/04/2019   CO2 21 (L) 04/04/2019   No results found for: INR, PROTIME   Anesthesia Physical Anesthesia Plan  ASA: III  Anesthesia Plan: General   Post-op Pain Management:    Induction: Intravenous  PONV Risk Score and Plan: 4 or greater and Ondansetron, Dexamethasone and Midazolam  Airway Management Planned: Oral ETT  Additional Equipment: None  Intra-op Plan:   Post-operative Plan: Extubation in OR  Informed Consent: I have reviewed the patients History and Physical, chart, labs and discussed the procedure including the risks, benefits and alternatives for the proposed anesthesia with the patient or authorized representative who has indicated his/her understanding and acceptance.      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 Pt sees multiple specialists due to her medical problems. Dr. Hale Bogus corresponded with all pertinent treating physicians regarding surgery. Per telephone encounter 04/02/19 "Dr. Kathlene November, rheumatology, recommended stopping her methotrexate about 1 week prior to surgery and staying off for about 10 days post op.  He does not need to see her. Dr. Idolina Primer, ophthalmology, felt she was safe for the [facility] including the positioning I will need.  He does not need to see her. Dr. Elsworth Soho, pulmonology, recommended she use her CPAP post op so she should bring it with her.  He also recommended the prolonged DVT/PE prophylaxis.  He does not need to see her. Dr. Burr Medico, hematology, agreed with surgery and felt this would make the Eliquis safer for her in the long run.  She needs to stop this three full days prior to surgery.  Thursday should be her last dose.  She does not need to see her. Will need to use Lovenox for 14 days post op."  Pt was evaluated by cardiology after PE in 2018. She had an echo showing normal LV and RV size and function and mild MVP without regurgitation. Low risk coronary calcium score and coronary CT angiogram. Coronary calcium score was 29.  Last seen by Dr. Ellyn Hack 04/04/17  and advised to followup PRN.    Preop labs reviewed, WNL.  EKG 06/27/18 NSR. Left axis deviation. Rate 90. Low voltage QRS. Inferior infarct , age undetermined. Possible Anterolateral infarct , age undetermined. No significant change since last tracing  TTE 01/17/17: - Left ventricle: The cavity size was normal. Systolic function was   normal. The estimated ejection fraction was in the range of 60%   to 65%. Wall motion was normal; there were no regional wall   motion abnormalities. Left ventricular diastolic function    parameters were normal. - Aortic valve: Transvalvular velocity was within the normal range.   There was no stenosis. There was no regurgitation. - Mitral valve: Calcified annulus. Mild prolapse, involving the   anterior leaflet and the posterior leaflet. Transvalvular   velocity was within the normal range. There was no evidence for   stenosis. There was no regurgitation. - Right ventricle: The cavity size was normal. Wall thickness was   normal. Systolic function was normal. - Tricuspid valve: There was mild regurgitation. - Pulmonary arteries: Systolic pressure was within the normal   range. PA peak pressure: 20 mm Hg (S).  CT cardiac 01/17/17: IMPRESSION: Coronary calcium score of 29 . This was 97th percentile for age and sex matched control.)      Anesthesia Quick Evaluation

## 2019-04-09 ENCOUNTER — Other Ambulatory Visit: Payer: Self-pay

## 2019-04-09 ENCOUNTER — Encounter (HOSPITAL_COMMUNITY)
Admission: RE | Disposition: A | Discharge status: Home / Self Care | Payer: Self-pay | Source: Home / Self Care | Attending: Obstetrics & Gynecology

## 2019-04-09 ENCOUNTER — Ambulatory Visit (HOSPITAL_COMMUNITY): Payer: 59 | Admitting: Anesthesiology

## 2019-04-09 ENCOUNTER — Encounter (HOSPITAL_COMMUNITY): Payer: Self-pay

## 2019-04-09 ENCOUNTER — Ambulatory Visit (HOSPITAL_COMMUNITY)
Admission: RE | Admit: 2019-04-09 | Discharge: 2019-04-10 | Disposition: A | Discharge status: Home / Self Care | Payer: 59 | Attending: Obstetrics & Gynecology | Admitting: Obstetrics & Gynecology

## 2019-04-09 ENCOUNTER — Ambulatory Visit (HOSPITAL_COMMUNITY): Payer: 59 | Admitting: Physician Assistant

## 2019-04-09 DIAGNOSIS — Z7901 Long term (current) use of anticoagulants: Secondary | ICD-10-CM | POA: Diagnosis not present

## 2019-04-09 DIAGNOSIS — Z7951 Long term (current) use of inhaled steroids: Secondary | ICD-10-CM | POA: Diagnosis not present

## 2019-04-09 DIAGNOSIS — N838 Other noninflammatory disorders of ovary, fallopian tube and broad ligament: Secondary | ICD-10-CM | POA: Insufficient documentation

## 2019-04-09 DIAGNOSIS — Z79899 Other long term (current) drug therapy: Secondary | ICD-10-CM | POA: Diagnosis not present

## 2019-04-09 DIAGNOSIS — D509 Iron deficiency anemia, unspecified: Secondary | ICD-10-CM | POA: Insufficient documentation

## 2019-04-09 DIAGNOSIS — N920 Excessive and frequent menstruation with regular cycle: Secondary | ICD-10-CM | POA: Diagnosis present

## 2019-04-09 DIAGNOSIS — J449 Chronic obstructive pulmonary disease, unspecified: Secondary | ICD-10-CM | POA: Diagnosis not present

## 2019-04-09 DIAGNOSIS — G4730 Sleep apnea, unspecified: Secondary | ICD-10-CM | POA: Insufficient documentation

## 2019-04-09 DIAGNOSIS — M1990 Unspecified osteoarthritis, unspecified site: Secondary | ICD-10-CM | POA: Insufficient documentation

## 2019-04-09 DIAGNOSIS — D259 Leiomyoma of uterus, unspecified: Secondary | ICD-10-CM

## 2019-04-09 DIAGNOSIS — N858 Other specified noninflammatory disorders of uterus: Secondary | ICD-10-CM | POA: Diagnosis not present

## 2019-04-09 DIAGNOSIS — N921 Excessive and frequent menstruation with irregular cycle: Secondary | ICD-10-CM

## 2019-04-09 DIAGNOSIS — K219 Gastro-esophageal reflux disease without esophagitis: Secondary | ICD-10-CM | POA: Insufficient documentation

## 2019-04-09 DIAGNOSIS — Z975 Presence of (intrauterine) contraceptive device: Secondary | ICD-10-CM | POA: Diagnosis not present

## 2019-04-09 DIAGNOSIS — M351 Other overlap syndromes: Secondary | ICD-10-CM | POA: Insufficient documentation

## 2019-04-09 DIAGNOSIS — Z86711 Personal history of pulmonary embolism: Secondary | ICD-10-CM | POA: Insufficient documentation

## 2019-04-09 DIAGNOSIS — Z6841 Body Mass Index (BMI) 40.0 and over, adult: Secondary | ICD-10-CM | POA: Insufficient documentation

## 2019-04-09 DIAGNOSIS — J4520 Mild intermittent asthma, uncomplicated: Secondary | ICD-10-CM | POA: Insufficient documentation

## 2019-04-09 DIAGNOSIS — D251 Intramural leiomyoma of uterus: Secondary | ICD-10-CM | POA: Diagnosis not present

## 2019-04-09 DIAGNOSIS — E559 Vitamin D deficiency, unspecified: Secondary | ICD-10-CM | POA: Insufficient documentation

## 2019-04-09 DIAGNOSIS — Z862 Personal history of diseases of the blood and blood-forming organs and certain disorders involving the immune mechanism: Secondary | ICD-10-CM

## 2019-04-09 HISTORY — PX: TOTAL LAPAROSCOPIC HYSTERECTOMY WITH SALPINGECTOMY: SHX6742

## 2019-04-09 HISTORY — PX: CYSTOSCOPY: SHX5120

## 2019-04-09 LAB — POCT PREGNANCY, URINE: Preg Test, Ur: NEGATIVE

## 2019-04-09 SURGERY — HYSTERECTOMY, TOTAL, LAPAROSCOPIC, WITH SALPINGECTOMY
Anesthesia: General | Site: Bladder

## 2019-04-09 MED ORDER — KETOROLAC TROMETHAMINE 30 MG/ML IJ SOLN: 30.0000 mg | Freq: Four times a day (QID) | INTRAMUSCULAR | Status: DC

## 2019-04-09 MED ORDER — ROPIVACAINE HCL 5 MG/ML IJ SOLN
INTRAMUSCULAR | Status: AC
  Filled 2019-04-09: qty 30

## 2019-04-09 MED ORDER — LIDOCAINE 2% (20 MG/ML) 5 ML SYRINGE
INTRAMUSCULAR | Status: AC
  Filled 2019-04-09: qty 5

## 2019-04-09 MED ORDER — SIMETHICONE 80 MG PO CHEW: 80.0000 mg | CHEWABLE_TABLET | Freq: Four times a day (QID) | ORAL | Status: DC | PRN

## 2019-04-09 MED ORDER — ROCURONIUM BROMIDE 10 MG/ML (PF) SYRINGE
PREFILLED_SYRINGE | INTRAVENOUS | Status: AC
  Filled 2019-04-09: qty 10

## 2019-04-09 MED ORDER — MIDAZOLAM HCL 2 MG/2ML IJ SOLN
INTRAMUSCULAR | Status: AC
  Filled 2019-04-09: qty 2

## 2019-04-09 MED ORDER — PREGABALIN 75 MG PO CAPS: 75.0000 mg | ORAL_CAPSULE | Freq: Every day | ORAL | Status: DC | PRN

## 2019-04-09 MED ORDER — LIDOCAINE 2% (20 MG/ML) 5 ML SYRINGE
INTRAMUSCULAR | Status: DC | PRN
  Administered 2019-04-09: 50 mg via INTRAVENOUS

## 2019-04-09 MED ORDER — BUPIVACAINE HCL (PF) 0.25 % IJ SOLN
INTRAMUSCULAR | Status: AC
  Filled 2019-04-09: qty 30

## 2019-04-09 MED ORDER — FENTANYL CITRATE (PF) 100 MCG/2ML IJ SOLN
INTRAMUSCULAR | Status: AC
  Filled 2019-04-09: qty 2

## 2019-04-09 MED ORDER — MORPHINE SULFATE (PF) 2 MG/ML IV SOLN
1.0000 mg | INTRAVENOUS | Status: DC | PRN
  Administered 2019-04-09: 2 mg via INTRAVENOUS
  Filled 2019-04-09: qty 1

## 2019-04-09 MED ORDER — DEXAMETHASONE SODIUM PHOSPHATE 10 MG/ML IJ SOLN
INTRAMUSCULAR | Status: AC
  Filled 2019-04-09: qty 1

## 2019-04-09 MED ORDER — SODIUM CHLORIDE 0.9 % IV SOLN
INTRAVENOUS | Status: DC | PRN
  Administered 2019-04-09: 60 mL

## 2019-04-09 MED ORDER — ONDANSETRON HCL 4 MG/2ML IJ SOLN
INTRAMUSCULAR | Status: DC | PRN
  Administered 2019-04-09: 4 mg via INTRAVENOUS

## 2019-04-09 MED ORDER — ACETAMINOPHEN 10 MG/ML IV SOLN
1000.0000 mg | Freq: Once | INTRAVENOUS | Status: DC | PRN
  Administered 2019-04-09: 1000 mg via INTRAVENOUS

## 2019-04-09 MED ORDER — OXYCODONE-ACETAMINOPHEN 5-325 MG PO TABS
1.0000 | ORAL_TABLET | ORAL | Status: DC | PRN
  Administered 2019-04-09 – 2019-04-10 (×2): 2 via ORAL
  Filled 2019-04-09 (×2): qty 2

## 2019-04-09 MED ORDER — ENOXAPARIN SODIUM 40 MG/0.4ML ~~LOC~~ SOLN
SUBCUTANEOUS | Status: AC
  Administered 2019-04-09: 40 mg via SUBCUTANEOUS
  Filled 2019-04-09: qty 0.4

## 2019-04-09 MED ORDER — DEXAMETHASONE SODIUM PHOSPHATE 10 MG/ML IJ SOLN
INTRAMUSCULAR | Status: DC | PRN
  Administered 2019-04-09: 10 mg via INTRAVENOUS

## 2019-04-09 MED ORDER — MEPERIDINE HCL 25 MG/ML IJ SOLN: 6.2500 mg | INTRAMUSCULAR | Status: DC | PRN

## 2019-04-09 MED ORDER — FENTANYL CITRATE (PF) 250 MCG/5ML IJ SOLN
INTRAMUSCULAR | Status: AC
  Filled 2019-04-09: qty 5

## 2019-04-09 MED ORDER — PROMETHAZINE HCL 25 MG/ML IJ SOLN: 6.2500 mg | INTRAMUSCULAR | Status: DC | PRN

## 2019-04-09 MED ORDER — LACTATED RINGERS IV SOLN
INTRAVENOUS | Status: DC
  Administered 2019-04-09: 17:00:00 via INTRAVENOUS

## 2019-04-09 MED ORDER — MONTELUKAST SODIUM 10 MG PO TABS
10.0000 mg | ORAL_TABLET | Freq: Every day | ORAL | Status: DC
  Administered 2019-04-09: 10 mg via ORAL
  Filled 2019-04-09: qty 1

## 2019-04-09 MED ORDER — DEXTROSE-NACL 5-0.45 % IV SOLN
INTRAVENOUS | Status: DC
  Administered 2019-04-09: 21:00:00 via INTRAVENOUS

## 2019-04-09 MED ORDER — ACETAMINOPHEN 10 MG/ML IV SOLN
INTRAVENOUS | Status: AC
  Filled 2019-04-09: qty 100

## 2019-04-09 MED ORDER — BACLOFEN 10 MG PO TABS
10.0000 mg | ORAL_TABLET | Freq: Three times a day (TID) | ORAL | Status: DC | PRN
  Administered 2019-04-09: 10 mg via ORAL
  Filled 2019-04-09: qty 1

## 2019-04-09 MED ORDER — ONDANSETRON HCL 4 MG/2ML IJ SOLN: 4.0000 mg | Freq: Four times a day (QID) | INTRAMUSCULAR | Status: DC | PRN

## 2019-04-09 MED ORDER — ALUM & MAG HYDROXIDE-SIMETH 200-200-20 MG/5ML PO SUSP: 30.0000 mL | ORAL | Status: DC | PRN

## 2019-04-09 MED ORDER — PANTOPRAZOLE SODIUM 40 MG PO TBEC
40.0000 mg | DELAYED_RELEASE_TABLET | Freq: Every day | ORAL | Status: DC
  Administered 2019-04-10: 40 mg via ORAL
  Filled 2019-04-09: qty 1

## 2019-04-09 MED ORDER — DULOXETINE HCL 60 MG PO CPEP
60.0000 mg | ORAL_CAPSULE | Freq: Every day | ORAL | Status: DC
  Administered 2019-04-09: 60 mg via ORAL
  Filled 2019-04-09: qty 1

## 2019-04-09 MED ORDER — MIDAZOLAM HCL 5 MG/5ML IJ SOLN
INTRAMUSCULAR | Status: DC | PRN
  Administered 2019-04-09: 2 mg via INTRAVENOUS

## 2019-04-09 MED ORDER — VASOPRESSIN 20 UNIT/ML IV SOLN
INTRAVENOUS | Status: AC
  Filled 2019-04-09: qty 1

## 2019-04-09 MED ORDER — PROPOFOL 10 MG/ML IV BOLUS
INTRAVENOUS | Status: AC
  Filled 2019-04-09: qty 40

## 2019-04-09 MED ORDER — FENTANYL CITRATE (PF) 100 MCG/2ML IJ SOLN
25.0000 ug | INTRAMUSCULAR | Status: DC | PRN
  Administered 2019-04-09 (×2): 50 ug via INTRAVENOUS

## 2019-04-09 MED ORDER — FLUTICASONE PROPIONATE 50 MCG/ACT NA SUSP
1.0000 | Freq: Every morning | NASAL | Status: DC
  Administered 2019-04-10: 1 via NASAL
  Filled 2019-04-09 (×2): qty 16

## 2019-04-09 MED ORDER — ACETAMINOPHEN 325 MG PO TABS: 325.0000 mg | ORAL_TABLET | Freq: Once | ORAL | Status: DC | PRN

## 2019-04-09 MED ORDER — ONDANSETRON HCL 4 MG PO TABS: 4.0000 mg | ORAL_TABLET | Freq: Four times a day (QID) | ORAL | Status: DC | PRN

## 2019-04-09 MED ORDER — PHENYLEPHRINE 40 MCG/ML (10ML) SYRINGE FOR IV PUSH (FOR BLOOD PRESSURE SUPPORT)
PREFILLED_SYRINGE | INTRAVENOUS | Status: AC
  Filled 2019-04-09: qty 10

## 2019-04-09 MED ORDER — MENTHOL 3 MG MT LOZG: 1.0000 | LOZENGE | OROMUCOSAL | Status: DC | PRN

## 2019-04-09 MED ORDER — OXYBUTYNIN CHLORIDE 5 MG PO TABS
5.0000 mg | ORAL_TABLET | Freq: Two times a day (BID) | ORAL | Status: DC
  Administered 2019-04-09 – 2019-04-10 (×2): 5 mg via ORAL
  Filled 2019-04-09 (×2): qty 1

## 2019-04-09 MED ORDER — PROPOFOL 10 MG/ML IV BOLUS
INTRAVENOUS | Status: DC | PRN
  Administered 2019-04-09: 160 mg via INTRAVENOUS

## 2019-04-09 MED ORDER — SODIUM CHLORIDE (PF) 0.9 % IJ SOLN
INTRAMUSCULAR | Status: AC
  Filled 2019-04-09: qty 100

## 2019-04-09 MED ORDER — ONDANSETRON HCL 4 MG/2ML IJ SOLN
INTRAMUSCULAR | Status: AC
  Filled 2019-04-09: qty 2

## 2019-04-09 MED ORDER — SUCCINYLCHOLINE CHLORIDE 200 MG/10ML IV SOSY
PREFILLED_SYRINGE | INTRAVENOUS | Status: DC | PRN
  Administered 2019-04-09: 140 mg via INTRAVENOUS

## 2019-04-09 MED ORDER — LACTATED RINGERS IV SOLN
INTRAVENOUS | Status: DC | PRN
  Administered 2019-04-09 (×2): via INTRAVENOUS

## 2019-04-09 MED ORDER — ACETAMINOPHEN 160 MG/5ML PO SOLN: 325.0000 mg | Freq: Once | ORAL | Status: DC | PRN

## 2019-04-09 MED ORDER — HYDROXYCHLOROQUINE SULFATE 200 MG PO TABS
400.0000 mg | ORAL_TABLET | Freq: Every day | ORAL | Status: DC
  Administered 2019-04-09: 400 mg via ORAL
  Filled 2019-04-09: qty 2

## 2019-04-09 MED ORDER — TOPIRAMATE 25 MG PO TABS
75.0000 mg | ORAL_TABLET | Freq: Every day | ORAL | Status: DC
  Administered 2019-04-09: 75 mg via ORAL
  Filled 2019-04-09: qty 3

## 2019-04-09 MED ORDER — BUPIVACAINE HCL (PF) 0.25 % IJ SOLN
INTRAMUSCULAR | Status: DC | PRN
  Administered 2019-04-09: 10 mL

## 2019-04-09 MED ORDER — FENTANYL CITRATE (PF) 100 MCG/2ML IJ SOLN
INTRAMUSCULAR | Status: DC | PRN
  Administered 2019-04-09: 125 ug via INTRAVENOUS
  Administered 2019-04-09: 25 ug via INTRAVENOUS
  Administered 2019-04-09 (×2): 50 ug via INTRAVENOUS

## 2019-04-09 MED ORDER — SODIUM CHLORIDE (PF) 0.9 % IJ SOLN
INTRAMUSCULAR | Status: AC
  Filled 2019-04-09: qty 50

## 2019-04-09 MED ORDER — SODIUM CHLORIDE 0.9 % IR SOLN
Status: DC | PRN
  Administered 2019-04-09: 3000 mL

## 2019-04-09 MED ORDER — PANTOPRAZOLE SODIUM 40 MG IV SOLR: 40.0000 mg | Freq: Every day | INTRAVENOUS | Status: DC

## 2019-04-09 MED ORDER — PREGABALIN 75 MG PO CAPS
75.0000 mg | ORAL_CAPSULE | Freq: Two times a day (BID) | ORAL | Status: DC
  Administered 2019-04-09 – 2019-04-10 (×2): 75 mg via ORAL
  Filled 2019-04-09 (×2): qty 1

## 2019-04-09 MED ORDER — SUGAMMADEX SODIUM 200 MG/2ML IV SOLN
INTRAVENOUS | Status: DC | PRN
  Administered 2019-04-09: 310 mg via INTRAVENOUS

## 2019-04-09 MED ORDER — SUCCINYLCHOLINE CHLORIDE 200 MG/10ML IV SOSY
PREFILLED_SYRINGE | INTRAVENOUS | Status: AC
  Filled 2019-04-09: qty 10

## 2019-04-09 MED ORDER — ROCURONIUM BROMIDE 10 MG/ML (PF) SYRINGE
PREFILLED_SYRINGE | INTRAVENOUS | Status: DC | PRN
  Administered 2019-04-09 (×2): 10 mg via INTRAVENOUS
  Administered 2019-04-09: 50 mg via INTRAVENOUS
  Administered 2019-04-09: 10 mg via INTRAVENOUS

## 2019-04-09 MED ORDER — ENOXAPARIN SODIUM 40 MG/0.4ML ~~LOC~~ SOLN
40.0000 mg | SUBCUTANEOUS | Status: AC
  Administered 2019-04-09: 06:00:00 40 mg via SUBCUTANEOUS

## 2019-04-09 MED ORDER — PHENYLEPHRINE 40 MCG/ML (10ML) SYRINGE FOR IV PUSH (FOR BLOOD PRESSURE SUPPORT)
PREFILLED_SYRINGE | INTRAVENOUS | Status: DC | PRN
  Administered 2019-04-09 (×2): 80 ug via INTRAVENOUS

## 2019-04-09 MED ORDER — ENOXAPARIN SODIUM 40 MG/0.4ML ~~LOC~~ SOLN
40.0000 mg | SUBCUTANEOUS | Status: DC
  Administered 2019-04-10: 40 mg via SUBCUTANEOUS
  Filled 2019-04-09: qty 0.4

## 2019-04-09 SURGICAL SUPPLY — 55 items
ADH SKN CLS APL DERMABOND .7 (GAUZE/BANDAGES/DRESSINGS) ×2
APL SRG 38 LTWT LNG FL B (MISCELLANEOUS)
APPLICATOR ARISTA FLEXITIP XL (MISCELLANEOUS) IMPLANT
BLADE 10 SAFETY STRL DISP (BLADE) ×1 IMPLANT
CABLE HIGH FREQUENCY MONO STRZ (ELECTRODE) ×3 IMPLANT
COVER MAYO STAND STRL (DRAPES) ×3 IMPLANT
COVER SURGICAL LIGHT HANDLE (MISCELLANEOUS) ×3 IMPLANT
DERMABOND ADVANCED (GAUZE/BANDAGES/DRESSINGS) ×1
DERMABOND ADVANCED .7 DNX12 (GAUZE/BANDAGES/DRESSINGS) ×2 IMPLANT
DURAPREP 26ML APPLICATOR (WOUND CARE) ×3 IMPLANT
GLOVE BIOGEL PI IND STRL 7.0 (GLOVE) ×8 IMPLANT
GLOVE BIOGEL PI INDICATOR 7.0 (GLOVE) ×4
GLOVE ECLIPSE 6.5 STRL STRAW (GLOVE) ×6 IMPLANT
GOWN STRL REUS W/ TWL LRG LVL3 (GOWN DISPOSABLE) ×8 IMPLANT
GOWN STRL REUS W/TWL LRG LVL3 (GOWN DISPOSABLE) ×12
HEMOSTAT ARISTA ABSORB 3G PWDR (HEMOSTASIS) IMPLANT
HIBICLENS CHG 4% 4OZ BTL (MISCELLANEOUS) ×3 IMPLANT
KIT TURNOVER KIT B (KITS) ×3 IMPLANT
LIGASURE VESSEL 5MM BLUNT TIP (ELECTROSURGICAL) ×3 IMPLANT
NDL INSUFFLATION 14GA 120MM (NEEDLE) ×2 IMPLANT
NEEDLE INSUFFLATION 14GA 120MM (NEEDLE) ×3 IMPLANT
NS IRRIG 1000ML POUR BTL (IV SOLUTION) ×3 IMPLANT
OCCLUDER COLPOPNEUMO (BALLOONS) ×3 IMPLANT
PACK LAPAROSCOPY BASIN (CUSTOM PROCEDURE TRAY) ×3 IMPLANT
PACK TRENDGUARD 450 HYBRID PRO (MISCELLANEOUS) IMPLANT
PACK TRENDGUARD 600 HYBRD PROC (MISCELLANEOUS) IMPLANT
POUCH LAPAROSCOPIC INSTRUMENT (MISCELLANEOUS) ×3 IMPLANT
PROTECTOR NERVE ULNAR (MISCELLANEOUS) ×6 IMPLANT
SCISSORS LAP 5X35 DISP (ENDOMECHANICALS) ×3 IMPLANT
SET CYSTO W/LG BORE CLAMP LF (SET/KITS/TRAYS/PACK) ×3 IMPLANT
SET IRRIG TUBING LAPAROSCOPIC (IRRIGATION / IRRIGATOR) ×3 IMPLANT
SET TRI-LUMEN FLTR TB AIRSEAL (TUBING) ×3 IMPLANT
SET TUBE SMOKE EVAC HIGH FLOW (TUBING) ×3 IMPLANT
SHEARS HARMONIC ACE PLUS 36CM (ENDOMECHANICALS) ×3 IMPLANT
SUT VIC AB 0 CT1 27 (SUTURE) ×6
SUT VIC AB 0 CT1 27XBRD ANBCTR (SUTURE) ×4 IMPLANT
SUT VICRYL 4-0 PS2 18IN ABS (SUTURE) ×6 IMPLANT
SUT VLOC 180 0 9IN  GS21 (SUTURE) ×1
SUT VLOC 180 0 9IN GS21 (SUTURE) ×2 IMPLANT
SYR 10ML LL (SYRINGE) ×3 IMPLANT
SYR 50ML LL SCALE MARK (SYRINGE) ×6 IMPLANT
TIP UTERINE 5.1X6CM LAV DISP (MISCELLANEOUS) IMPLANT
TIP UTERINE 6.7X10CM GRN DISP (MISCELLANEOUS) ×1 IMPLANT
TIP UTERINE 6.7X6CM WHT DISP (MISCELLANEOUS) IMPLANT
TIP UTERINE 6.7X8CM BLUE DISP (MISCELLANEOUS) IMPLANT
TOWEL GREEN STERILE FF (TOWEL DISPOSABLE) ×6 IMPLANT
TRAY FOLEY W/BAG SLVR 14FR (SET/KITS/TRAYS/PACK) ×3 IMPLANT
TRENDGUARD 450 HYBRID PRO PACK (MISCELLANEOUS)
TRENDGUARD 600 HYBRID PROC PK (MISCELLANEOUS) ×3
TROCAR ADV FIXATION 5X100MM (TROCAR) ×3 IMPLANT
TROCAR PORT AIRSEAL 5X120 (TROCAR) ×3 IMPLANT
TROCAR XCEL NON BLADE 8MM B8LT (ENDOMECHANICALS) ×3 IMPLANT
TROCAR XCEL NON-BLD 5MMX100MML (ENDOMECHANICALS) ×3 IMPLANT
UNDERPAD 30X36 HEAVY ABSORB (UNDERPADS AND DIAPERS) ×3 IMPLANT
WARMER LAPAROSCOPE (MISCELLANEOUS) ×3 IMPLANT

## 2019-04-09 NOTE — Progress Notes (Signed)
Pt has home CPAP.  

## 2019-04-09 NOTE — H&P (Signed)
Holly Mcdonald is an 43 y.o. female  G74 DAAF with hx of menorrhagia, iron deficiency, multiple iron infusions x 4 in the last 18 month, two hysteroscopic fibroid resections as well Mirena IUD placement who continues to have heavy bleeding.  She is desirous of definitive treatment.  Risks, benefits and alternatives (specificially [place] have been discussed).  She has been cleared from pulmonology, rheumatology, hematology and ophthalmology for this procedure.  She is aware that if she cannot tolerate the positioning, I would not convert to an open procedure.  She does have a complicated past medical hx with PE in 2018 and later diagnosed with mixed connective tissue d/o and inflammatory arthritis.  This is under much better control now compared to before the PE.  She is on eliquis long term and stopped this 04/05/2019.  We have reviewed the entire procedure today as well as possible complications.  She is ready to proceed.  Pertinent Gynecological History: Menses: regular Bleeding: menorrhagia Contraception: abstinence DES exposure: denies Blood transfusions: none Sexually transmitted diseases: no past history Previous GYN Procedures: hysteroscopic fibroid resection x 2  Last mammogram: normal Date: 03/2019 Last pap: ASCUS pap wtih +HR HPV 02/07/2019 and colposcopy 03/12/2019 OB History: G0, P0   Menstrual History: No LMP recorded. (Menstrual status: IUD).    Past Medical History:  Diagnosis Date  . Anticoagulated    eliquis  . Arthritis    inflammatory  . 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 Bladder spasm   . Fibromyalgia   . GERD (gastroesophageal reflux disease)   . History of chronic bronchitis    last flare up 11-18-17  . History of pulmonary embolism 10/2016--- followed by pcp and pulmoloigst   dx bilateral PE , bilateral lower lobes , segmental , unprovoked, negative hypercoagulable work-up-- treated w/ eliquis  . Iron deficiency anemia   . Migraine   .  Mild intermittent asthma    pulmologist-  dr Elsworth Soho  . Mixed connective tissue disease ([place])   . Morbid obesity with BMI of 45.0-49.9, adult ([place])   . Open-angle glaucoma of both eyes   . Pre-diabetes   . 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 Sleep apnea   . Uterine fibroid   . Vitamin D deficiency     Past Surgical History:  Procedure Laterality Date  . ANAL EXAMINATION UNDER ANESTHESIA  age 70   w/ removal cyst  . DILATATION & CURETTAGE/HYSTEROSCOPY WITH MYOSURE N/A 10/17/2017   Procedure: [place];  Surgeon: Anastasio Auerbach, MD;  Location: [facility];  Service: Gynecology;  Laterality: N/A;  . DILATATION & CURETTAGE/HYSTEROSCOPY WITH MYOSURE N/A 04/11/2018   Procedure: DILATATION & CURETTAGE/HYSTEROSCOPY WITH MYOSURE;  Surgeon: Anastasio Auerbach, MD;  Location: [facility];  Service: Gynecology;  Laterality: N/A;  . GYNECOLOGIC CRYOSURGERY  age 56s  . TONSILLECTOMY  age 21  . TRANSTHORACIC ECHOCARDIOGRAM  01/17/2017   ef 60-65%/  mild TR    Family History  Problem Relation Age of Onset  . Pulmonary embolism Mother        Last year  . Mental retardation Mother        PTSD and fibromyalgia  . Heart disease Mother        CHF  . Congestive Heart Failure Mother   . Heart disease Father 24       CAD  . Hyperlipidemia Father   . Diabetes Paternal Grandmother   .  Heart attack Paternal Grandmother   . Congestive Heart Failure Maternal Aunt   . Cancer Neg Hx     Social History:  reports that she has never smoked. She has never used smokeless tobacco. She reports current alcohol use. She reports that she does not use drugs.  Allergies:  Allergies  Allergen Reactions  . Darvon [Propoxyphene] Nausea And Vomiting    Medications Prior to Admission  Medication Sig Dispense Refill Last Dose  . ADVAIR DISKUS 250-50 MCG/DOSE AEPB INHALE 1 PUFF INTO THE LUNGS TWICE DAILY AS NEEDED  (Patient taking differently: Inhale 1 puff into the lungs 2 (two) times daily. ) 60 each 5 04/09/2019 at 0500  . baclofen (LIORESAL) 10 MG tablet TAKE 1 TABLET BY MOUTH THREE TIMES DAILY AS NEEDED FOR MUSCLE SPASMS (Patient taking differently: Take 5 mg by mouth 3 (three) times daily as needed for muscle spasms. ) 90 tablet 5 Past Week at Unknown time  . cetirizine (ZYRTEC) 10 MG tablet Take 1 tablet (10 mg total) by mouth every morning. (Patient taking differently: Take 10 mg by mouth daily. ) 30 tablet 5 04/09/2019 at 0500  . Cyanocobalamin (VITAMIN B 12 PO) Take 1,000 mg by mouth daily.    04/08/2019 at Unknown time  . DULoxetine (CYMBALTA) 60 MG capsule TAKE 1 CAPSULE(60 MG) BY MOUTH DAILY (Patient taking differently: Take 60 mg by mouth at bedtime. ) 30 capsule 2 04/08/2019 at Unknown time  . FEROSUL 325 (65 Fe) MG tablet TAKE 3 TABLETS(975 MG) BY MOUTH EVERY MORNING 90 tablet 5 Past Month at Unknown time  . folic acid (FOLVITE) 1 MG tablet Take 2 mg by mouth daily.    04/08/2019 at Unknown time  . hydroxychloroquine (PLAQUENIL) 200 MG tablet Take 400 mg by mouth at bedtime.    04/08/2019 at Unknown time  . methotrexate 2.5 MG tablet Take 15 mg by mouth once a week. Friday in the evening   Past Month at Unknown time  . montelukast (SINGULAIR) 10 MG tablet Take 1 tablet (10 mg total) by mouth at bedtime. 30 tablet 5 04/08/2019 at Unknown time  . omeprazole (PRILOSEC) 40 MG capsule TAKE 1 CAPSULE(40 MG) BY MOUTH DAILY (Patient taking differently: Take 40 mg by mouth daily. ) 30 capsule 5 04/09/2019 at 0500  . oxybutynin (DITROPAN) 5 MG tablet Take 5 mg by mouth 2 (two) times daily.   11 04/08/2019 at Unknown time  . pregabalin (LYRICA) 75 MG capsule TAKE 1 CAPSULE BY MOUTH THREE TIMES DAILY (Patient taking differently: Take 75 mg by mouth 2 (two) times daily. Additional 75 mg if needed) 90 capsule 3 04/09/2019 at 0500  . timolol (TIMOPTIC) 0.5 % ophthalmic solution Place 1 drop into both eyes daily.     04/09/2019 at 0500  . topiramate (TOPAMAX) 25 MG tablet Take 75 mg by mouth at bedtime.   1 04/08/2019 at Unknown time  . Vitamin D, Ergocalciferol, (DRISDOL) 1.25 MG (50000 UT) CAPS capsule TAKE 1 CAPSULE BY MOUTH EVERY 10 DAYS (Patient taking differently: Take 50,000 Units by mouth every 7 (seven) days. Tuesday) 10 capsule 1 Past Week at Unknown time  . albuterol (VENTOLIN HFA) 108 (90 Base) MCG/ACT inhaler Inhale 2 puffs into the lungs every 6 (six) hours as needed. (Patient taking differently: Inhale 2 puffs into the lungs every 6 (six) hours as needed for wheezing or shortness of breath. ) 18 g 0 Unknown at Unknown time  . benzonatate (TESSALON) 100 MG capsule Take 200 mg by mouth  3 (three) times daily as needed for cough.   Unknown at Unknown time  . ELIQUIS 5 MG TABS tablet TAKE 1 TABLET(5 MG) BY MOUTH TWICE DAILY 60 tablet 5 04/05/2019  . enoxaparin (LOVENOX) 40 MG/0.4ML injection Inject one 40mg /0.2ml injection q am for 14 days post op 5.6 mL 0  at not started  . fluticasone (FLONASE) 50 MCG/ACT nasal spray SHAKE LIQUID AND USE 1 SPRAY IN EACH NOSTRIL DAILY--  in am (Patient taking differently: Place 1 spray into both nostrils daily as needed for allergies. SHAKE LIQUID AND USE 1 SPRAY IN EACH NOSTRIL DAILY--  in am) 16 g 2 More than a month at Unknown time  . megestrol (MEGACE) 20 MG tablet Take one tablet by mouth twice daily for 7 days then daily as needed for bleeding (Patient not taking: Reported on 03/26/2019) 30 tablet 0 Not Taking at Unknown time  . methocarbamol (ROBAXIN) 500 MG tablet Take 1 tablet (500 mg total) by mouth 2 (two) times daily as needed for muscle spasms. 60 tablet 1 More than a month at Unknown time    Review of Systems  All other systems reviewed and are negative.   Blood pressure 108/62, pulse 89, temperature 98.2 F (36.8 C), resp. rate 18, height 5\' 10"  (1.778 m), weight (!) 152.9 kg, SpO2 100 %. Physical Exam  Constitutional: She is oriented to person,  place, and time. She appears well-developed and well-nourished.  HENT:  Head: Normocephalic and atraumatic.  Cardiovascular: Normal rate and regular rhythm.  Respiratory: Effort normal and breath sounds normal.  GI: Soft. Bowel sounds are normal.  Neurological: She is alert and oriented to person, place, and time.  Skin: Skin is warm and dry.  Psychiatric: She has a normal mood and affect.    Results for orders placed or performed during the hospital encounter of 04/09/19 (from the past 24 hour(s))  Pregnancy, urine POC     Status: None   Collection Time: 04/09/19  6:16 AM  Result Value Ref Range   Preg Test, Ur NEGATIVE NEGATIVE    No results found.  Assessment/Plan: 43 yo G0 DAAF here for TLH/bilateral salpingectomy, cystoscopy, possible oophorectomy due to h/o menorrhagia, fibroid uterus, h/o iron infusions.  Questions answered.  Pt ready to proceed.  [name] 04/09/2019, 6:43 AM

## 2019-04-09 NOTE — Transfer of Care (Signed)
Immediate Anesthesia Transfer of Care Note  Patient: Holly Mcdonald  Procedure(s) Performed: TOTAL LAPAROSCOPIC HYSTERECTOMY WITH SALPINGECTOMY (Bilateral Abdomen) CYSTOSCOPY (N/A Bladder)  Patient Location: PACU  Anesthesia Type:General  Level of Consciousness: awake, oriented and patient cooperative  Airway & Oxygen Therapy: Patient Spontanous Breathing and Patient connected to face mask oxygen  Post-op Assessment: Report given to RN and Post -op Vital signs reviewed and stable  Post vital signs: Reviewed  Last Vitals:  Vitals Value Taken Time  BP 90/53 04/09/19 1016  Temp    Pulse 84 04/09/19 1022  Resp 20 04/09/19 1022  SpO2 100 % 04/09/19 1022  Vitals shown include unvalidated device data.  Last Pain:  Vitals:   04/09/19 0558  PainSc: 0-No pain         Complications: No apparent anesthesia complications

## 2019-04-09 NOTE — Anesthesia Procedure Notes (Signed)
Procedure Name: Intubation Date/Time: 04/09/2019 7:30 AM Performed by: Jenne Campus, CRNA Pre-anesthesia Checklist: Patient identified, Emergency Drugs available, Suction available and Patient being monitored Patient Re-evaluated:Patient Re-evaluated prior to induction Oxygen Delivery Method: Circle System Utilized Preoxygenation: Pre-oxygenation with 100% oxygen Induction Type: IV induction Ventilation: Mask ventilation without difficulty Laryngoscope Size: Miller and 3 Grade View: Grade II Tube type: Oral Tube size: 7.5 mm Number of attempts: 1 Airway Equipment and Method: Stylet and Oral airway Placement Confirmation: ETT inserted through vocal cords under direct vision,  positive ETCO2 and breath sounds checked- equal and bilateral Secured at: 22 cm Tube secured with: Tape Dental Injury: Teeth and Oropharynx as per pre-operative assessment

## 2019-04-09 NOTE — Anesthesia Postprocedure Evaluation (Signed)
Anesthesia Post Note  Patient: [name]  Procedure(s) Performed: TOTAL LAPAROSCOPIC HYSTERECTOMY WITH SALPINGECTOMY (Bilateral Abdomen) CYSTOSCOPY (N/A Bladder)     Patient location during evaluation: PACU Anesthesia Type: General Level of consciousness: awake and alert Pain management: pain level controlled Vital Signs Assessment: post-procedure vital signs reviewed and stable Respiratory status: spontaneous breathing, nonlabored ventilation, respiratory function stable and patient connected to nasal cannula oxygen Cardiovascular status: blood pressure returned to baseline and stable Postop Assessment: no apparent nausea or vomiting Anesthetic complications: no    Last Vitals:  Vitals:   04/09/19 1345 04/09/19 1419  BP: (!) 123/92 (!) 136/91  Pulse: 97 (!) 101  Resp: 16 18  Temp: (!) 36.1 C 36.9 C  SpO2: 99% 98%    Last Pain:  Vitals:   04/09/19 1145  PainSc: Asleep                 Effie Berkshire

## 2019-04-09 NOTE — Progress Notes (Signed)
Day of Surgery Procedure(s) (LRB): TOTAL LAPAROSCOPIC HYSTERECTOMY WITH SALPINGECTOMY (Bilateral) CYSTOSCOPY (N/A)  Subjective: Patient reports no nausea and good pain control.  Denies SOB.  She has voided and walked and eaten regular food.  She denies any vaginal bleeding.  Objective: I have reviewed patient's vital signs, intake and output and medications.  General: alert and no distress Resp: clear to auscultation bilaterally Cardio: regular rate and rhythm, S1, S2 normal, no murmur, click, rub or gallop GI: soft, non-tender; bowel sounds normal; no masses,  no organomegaly and incision: clean, dry and intact Extremities: extremities normal, atraumatic, no cyanosis or edema Vaginal Bleeding: none SCDs are in place  Assessment: s/p Procedure(s) with comments: TOTAL LAPAROSCOPIC HYSTERECTOMY WITH SALPINGECTOMY (Bilateral) - Has Mirena IUD in place, 3 hours surgery time. Extended recovery bed. CYSTOSCOPY (N/A): stable and progressing well  Plan: Advance diet Encourage ambulation Advance to PO medication Continue Lovenox in AM  CBC and BMP in AM  LOS: 0 days    [name] 04/09/2019, 6:18 PM

## 2019-04-09 NOTE — Op Note (Signed)
04/09/2019  9:54 AM  PATIENT:  Holly Mcdonald  43 y.o. female  PRE-OPERATIVE DIAGNOSIS:  Menorrhagia, uterine fibroids, anemia  POST-OPERATIVE DIAGNOSIS:  menorrhagia, uterine fibroids, anemia, small area consistent with endometriosis on posterior serosa  PROCEDURE:  Procedure(s): TOTAL LAPAROSCOPIC HYSTERECTOMY WITH SALPINGECTOMY CYSTOSCOPY  SURGEON:  [name]  ASSISTANTS: Sumner Boast, MD   ANESTHESIA:   general  ESTIMATED BLOOD LOSS: 25 mL  BLOOD ADMINISTERED:none   FLUIDS: 1000cc LR  UOP: 100cc clear UOP  SPECIMEN:  Uterus, cervix, bilateral fallopian tubes  DISPOSITION OF SPECIMEN:  PATHOLOGY  FINDINGS: enlarged uterus with multiple fibroid, small simple ovarian cyst on right ovary, possible area of endometriosis on posterior serosa of uterus, normal ovaries, adhesions of right colon to right sidewall, normal appendix  DESCRIPTION OF OPERATION: Patient is taken to the operating room. She is placed in the supine position. She is a running IV in place. Informed consent was present on the chart. SCDs on her lower extremities and functioning properly. Patient was positioned while she was awake.  Her legs were placed in the low lithotomy position in [place]. Her arms were tucked by the side.  General endotracheal anesthesia was administered by the anesthesia staff without difficulty. Dr. Suella Broad, anesthesia, oversaw case.  Time out performed.    Chlora prep was then used to prep the abdomen and Betadine was used to prep the inner thighs, perineum and vagina. Once 3 minutes had past the patient was draped in a normal standard fashion. The legs were lifted to the high lithotomy position. The cervix was visualized by placing a heavy weighted speculum in the posterior aspect of the vagina and using a curved Deaver retractor to the retract anteriorly. The anterior lip of the cervix was grasped with single-tooth tenaculum.  The cervix sounded to 11 cm. Pratt  dilators were used to dilate the cervix up to a #21. A RUMI uterine manipulator was obtained. A #10 disposable tip was placed on the RUMI manipulator as well as a 3.0, silver KOH ring. This was passed through the cervix and the bulb of the disposable tip was inflated with 10 cc of normal saline. There was a good fit of the KOH ring around the cervix. The tenaculum was removed. There is also good manipulation of the uterus. The speculum and retractor were removed as well. A Foley catheter was placed to straight drain.  Clear urine was noted. Legs were lowered to the low lithotomy position and attention was turned the abdomen.  The umbilicus was everted.  A Veress needle was obtained. Syringe of sterile saline was placed on a open Veress needle.  This was passed into the umbilicus until just when the fluid started to drip.  Then low flow CO2 gas was attached the needle and the pneumoperitoneum was achieved without difficulty. Once four liters of gas was in the abdomen the Veress needle was removed and a 5 millimeter non-bladed Optiview trocar and port were passed directly to the abdomen. The laparoscope was then used to confirm intraperitoneal placement. A large uterus with fundal fibroid was noted.  There was some endometriosis appearing tissue on the back of the cervix.  Ovaries were normal.  Are where prior tubal reanastomosis had been performed was seen and looks really nice, without any adhesions.  Locations for RLQ, LLQ, and suprapubic ports were noted by transillumination of the abdominal wall.  0.25% marcaine was used to anesthetize the skin.  80mm skin incision was made in the RLQ and  an AirSeal port was placed underdirect visualization of the laparoscope.  Then a 58mm skin incision was made and a 27mm nonbladed trochar and port was placed in the LLQ.  Finally, and 69mm skin incision was made about 4cm above the pubic symphasis and an 36mm non-bladed port was placed with direct visualization of the laparoscope.   All trochars were removed.    Ureters were identifies.  Attention was turned to the right side. With uterus on stretch the left tube was excised off the ovary and mesosalpinx was dissected to free the tube. Then the right utero-ovarian pedicle was serially clamped cauterized and incised using the ligasure device. Right round ligament was serially clamped cauterized and incised. The anterior and posterior peritoneum of the inferior leaf of the broad ligament were opened. The beginning of the bladder flap was created.  The bladder was taken down below the level of the KOH ring. The right uterine artery skeletonized and then just superior to the KOH ring this vessel was serially clamped and cauterized.  It was not incised to control back bleeding prior clamping the uterine artery on the left side.  Attention was turned the left side.  The uterus was placed on stretch to the opposite side.  The tube was excised off the ovary using sharp dissection a bipolar cautery.  The mesosalpinx was incised freeing the tube. Then the left uterine ovarian pedicle was serially clamped cauterized and incised. Next the left round ligament was serially clamped cauterized and incised. The anterior posterior peritoneum of the inferiorly for the broad ligament were opened. The anterior peritoneum was carried across to the dissection on the opposite side. The remainder of the bladder flap was created using sharp dissection. The bladder was well below the level of the KOH ring. The left uterine artery skeletonized. Then the left uterine artery, above the level of the KOH ring, was serially clamped cauterized and incised. The uterus was devascularized at this point.  Attention was turned back on the right and the right uterine artery was transected at this point.  There was no bleeding.    The colpotomy was performed a starting in the midline and using a harmonic scalpel with the inferior edge of the open blade  This was carried around a  circumferential fashion until the vaginal mucosa was completely incised in the specimen was freed.  The specimen was then delivered to the vagina.  A vaginal occlusive device was used to maintain the pneumoperitoneum  Instruments were changed with a needle driver and Kobra graspers.  Using a 9 inch V. lock suture, the cuff was closed by incorporating the anterior and posterior vaginal mucosa in each stitch. This was carried across all the way to the left corner and a running fashion. Two stitches were brought back towards the midline and the suture was cut flush with the vagina. The needle was brought out the pelvis. The pelvis was irrigated. All pedicles were inspected. No bleeding was noted.  CO2 pressured were lowered to 30mm Hg.  No bleeding was noted.  Arista was placed along all pedicles.  Photodocumentation was obtained several times during the procedure.  Ureters were noted deep in the pelvis to be peristalsing.  At this point the procedure was completed. The remaining instruments were removed.  The patient was taken out of Trendelenburg positioning.  Several deep breaths were given to the patient's trying to any gas the abdomen and the ports were removed without difficulty.    The skin was  then closed with subcuticular stitches of 3-0 Vicryl. The skin was cleansed Dermabond was applied. Attention was then turned the vagina and the cuff was inspected. No bleeding was noted. The anterior posterior vaginal mucosa was incorporated in each stitch. The Foley catheter was removed.  Cystoscopy was performed.  Entire bladder was visualized.  No sutures or bladder injuries were noted.  Ureters were noted with normal urine jets from each one was seen.  Foley was left out after the cystoscopic fluid was drained and cystoscope removed.  Sponge, lap, needle, initially counts were correct x2. Patient tolerated the procedure very well. She was awakened from anesthesia, extubated and taken to recovery in stable  condition.  Uterus weight in OR:  314 grams.  COUNTS:  YES  PLAN OF CARE: Transfer to PACU

## 2019-04-10 ENCOUNTER — Encounter (HOSPITAL_COMMUNITY): Payer: Self-pay | Admitting: Obstetrics & Gynecology

## 2019-04-10 DIAGNOSIS — N920 Excessive and frequent menstruation with regular cycle: Secondary | ICD-10-CM | POA: Diagnosis not present

## 2019-04-10 LAB — BASIC METABOLIC PANEL
Anion gap: 10 (ref 5–15)
BUN: 9 mg/dL (ref 6–20)
CO2: 20 mmol/L — ABNORMAL LOW (ref 22–32)
Calcium: 8.7 mg/dL — ABNORMAL LOW (ref 8.9–10.3)
Chloride: 107 mmol/L (ref 98–111)
Creatinine, Ser: 0.68 mg/dL (ref 0.44–1.00)
GFR calc Af Amer: >60 mL/min (ref >60)
GFR calc non Af Amer: >60 mL/min (ref >60)
Glucose, Bld: 132 mg/dL — ABNORMAL HIGH (ref 70–99)
Potassium: 3.8 mmol/L (ref 3.5–5.1)
Sodium: 137 mmol/L (ref 135–145)

## 2019-04-10 LAB — CBC
HCT: 35.9 % — ABNORMAL LOW (ref 36.0–46.0)
Hemoglobin: 12.2 g/dL (ref 12.0–15.0)
MCH: 31.1 pg (ref 26.0–34.0)
MCHC: 34 g/dL (ref 30.0–36.0)
MCV: 91.6 fL (ref 80.0–100.0)
Platelets: 213 10*3/uL (ref 150–400)
RBC: 3.92 MIL/uL (ref 3.87–5.11)
RDW: 12.8 % (ref 11.5–15.5)
WBC: 11.2 10*3/uL — ABNORMAL HIGH (ref 4.0–10.5)
nRBC: 0 % (ref 0.0–0.2)

## 2019-04-10 LAB — SURGICAL PATHOLOGY

## 2019-04-10 MED ORDER — OXYCODONE-ACETAMINOPHEN 5-325 MG PO TABS: 1.0000 | ORAL_TABLET | ORAL | 0 refills | Status: DC | PRN

## 2019-04-10 NOTE — Progress Notes (Signed)
Discharged home today. Personal belongnigs,discharged instructions given to patient. Advised to pick up medications called in at pharmacy of choice.Verbalized understanding of instructions

## 2019-04-10 NOTE — Progress Notes (Signed)
1 Day Post-Op Procedure(s) (LRB): TOTAL LAPAROSCOPIC HYSTERECTOMY WITH SALPINGECTOMY (Bilateral) CYSTOSCOPY (N/A)  Subjective: Patient reports good pain control and no nausea.  She has passed gas.  Voiding without difficulty.  Has eaten regular dinner.  She has walked several times in the hall.  Objective: I have reviewed patient's vital signs, intake and output, medications and labs. Post op hb: 12.2  General: alert and no distress Resp: clear to auscultation bilaterally Cardio: regular rate and rhythm, S1, S2 normal, no murmur, click, rub or gallop GI: soft, non-tender; bowel sounds normal; no masses,  no organomegaly and incision: clean, dry and intact Extremities: extremities normal, atraumatic, no cyanosis or edema Vaginal Bleeding: none  Assessment: s/p Procedure(s) with comments: TOTAL LAPAROSCOPIC HYSTERECTOMY WITH SALPINGECTOMY (Bilateral) - Has Mirena IUD in place, 3 hours surgery time. Extended recovery bed. CYSTOSCOPY (N/A): progressing well  Plan: Discharge home  LOS: 0 days    [name] 04/10/2019, 6:35 AM

## 2019-04-10 NOTE — Plan of Care (Signed)

## 2019-04-10 NOTE — Discharge Instructions (Signed)

## 2019-04-13 ENCOUNTER — Other Ambulatory Visit: Payer: Self-pay

## 2019-04-16 ENCOUNTER — Ambulatory Visit (INDEPENDENT_AMBULATORY_CARE_PROVIDER_SITE_OTHER): Payer: 59 | Admitting: Obstetrics & Gynecology

## 2019-04-16 ENCOUNTER — Encounter: Payer: Self-pay | Admitting: Obstetrics & Gynecology

## 2019-04-16 ENCOUNTER — Telehealth: Payer: Self-pay

## 2019-04-16 ENCOUNTER — Other Ambulatory Visit: Payer: Self-pay

## 2019-04-16 VITALS — BP 132/80 | HR 84 | Temp 96.4°F | Resp 12 | Ht 70.0 in | Wt 340.0 lb

## 2019-04-16 DIAGNOSIS — R3915 Urgency of urination: Secondary | ICD-10-CM

## 2019-04-16 DIAGNOSIS — Z9889 Other specified postprocedural states: Secondary | ICD-10-CM

## 2019-04-16 NOTE — Telephone Encounter (Signed)
Closing encounter. Patient's appointment was moved up.

## 2019-04-16 NOTE — Progress Notes (Signed)
Post Operative Visit  Procedure:TOTAL LAPAROSCOPIC HYSTERECTOMY WITH SALPINGECTOMY (Bilateral Abdomen) CYSTOSCOPY (N/A Bladder) Days Post-op: 1 week   Subjective: Doing well.  Denies vaginal bleeding.  Reports she has a little drainage from the incision below her belly button.  She is on day 8 of her Lovenox.  She will receive this for 14 days.  She is voiding easily and does feel like she fully emptying.  She is now having regular bowel movements.  She has stopped the narcotic.  She hasn't taken any Percocet since Friday.  Objective: BP 132/80 (BP Location: Left Arm, Patient Position: Sitting, Cuff Size: Large)   Pulse 84   Temp (!) 96.4 F (35.8 C) (Temporal)   Resp 12   Ht 5\' 10"  (1.778 m)   Wt (!) 340 lb (154.2 kg)   LMP 03/05/2019   BMI 48.78 kg/m   EXAM General: alert and no distress Resp: clear to auscultation bilaterally Cardio: regular rate and rhythm, S1, S2 normal, no murmur, click, rub or gallop GI: soft, non-tender; bowel sounds normal; no masses,  no organomegaly and incision: clean, dry, intact and small area where incision pulled apart but it is healing, benzoin and steristrips applied Extremities: extremities normal, atraumatic, no cyanosis or edema Vaginal Bleeding: none  Ext: No edema  Assessment: s/p TLH/bilateral salpingectomy/cystoscopy due to menorrhagia, fibroids On 14 days of Lovenox post op due to h/o PE Urinary urgency with hx of OAB  Plan: Recheck 5 weeks Urine culture pending today

## 2019-04-16 NOTE — Telephone Encounter (Signed)
Tried calling patient to move appointment up to 12:30p or to a different day, no answer, left patient a message on voicemail to call me back at [phone].

## 2019-04-18 LAB — URINE CULTURE

## 2019-04-24 ENCOUNTER — Other Ambulatory Visit: Payer: Self-pay

## 2019-04-24 NOTE — Progress Notes (Signed)
[name]   [phone]) [phone] [phone]) [phone]   Clinic Follow up Note   Patient Care Team: Martinique, Betty G, MD as PCP - General (Family Medicine)  Date of Service:  04/26/2019  CHIEF COMPLAINT: F/u on IDA and h/o of PE   CURRENT THERAPY:  Ferrous Sulfate 3 tablets daily, Eliquis 5 mg, baby aspirin.  INTERVAL HISTORY:  Holly Mcdonald is here for a follow up of IDA and PE. She presents to the clinic alone. She was last seen by me 6 months ago. She notes she is doing well. She notes she had her hysterectomy on 04/09/19. She denies any bleeding due to Eliquis. She notes she feels her fatigue is baseline from fibromyalgia.   REVIEW OF SYSTEMS:   Constitutional: Denies fevers, chills or abnormal weight loss Eyes: Denies blurriness of vision Ears, nose, mouth, throat, and face: Denies mucositis or sore throat Respiratory: Denies cough, dyspnea or wheezes Cardiovascular: Denies palpitation, chest discomfort or lower extremity swelling Gastrointestinal:  Denies nausea, heartburn or change in bowel habits Skin: Denies abnormal skin rashes Lymphatics: Denies new lymphadenopathy or easy bruising Neurological:Denies numbness, tingling or new weaknesses Behavioral/Psych: Mood is stable, no new changes  All other systems were reviewed with the patient and are negative.  MEDICAL HISTORY:  Past Medical History:  Diagnosis Date  . Anticoagulated    eliquis  . Arthritis    inflammatory  . 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 Bladder spasm   . Fibromyalgia   . GERD (gastroesophageal reflux disease)   . History of chronic bronchitis    last flare up 11-18-17  . History of pulmonary embolism 10/2016--- followed by pcp and pulmoloigst   dx bilateral PE , bilateral lower lobes , segmental , unprovoked, negative hypercoagulable work-up-- treated w/ eliquis  . Iron deficiency anemia   . Migraine   . Mild intermittent asthma    pulmologist-   dr Elsworth Soho  . Mixed connective tissue disease ([place])   . Morbid obesity with BMI of 45.0-49.9, adult ([place])   . Open-angle glaucoma of both eyes   . Pre-diabetes   . 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 Sleep apnea   . Uterine fibroid   . Vitamin D deficiency     SURGICAL HISTORY: Past Surgical History:  Procedure Laterality Date  . ANAL EXAMINATION UNDER ANESTHESIA  age 61   w/ removal cyst  . CYSTOSCOPY N/A 04/09/2019   Procedure: CYSTOSCOPY;  Surgeon: [name], MD;  Location: [facility];  Service: Gynecology;  Laterality: N/A;  . DILATATION & CURETTAGE/HYSTEROSCOPY WITH MYOSURE N/A 10/17/2017   Procedure: DILATATION & CURETTAGE/HYSTEROSCOPY WITH MYOSURE;  Surgeon: Anastasio Auerbach, MD;  Location: [facility];  Service: Gynecology;  Laterality: N/A;  . DILATATION & CURETTAGE/HYSTEROSCOPY WITH MYOSURE N/A 04/11/2018   Procedure: DILATATION & CURETTAGE/HYSTEROSCOPY WITH MYOSURE;  Surgeon: Anastasio Auerbach, MD;  Location: [facility];  Service: Gynecology;  Laterality: N/A;  . GYNECOLOGIC CRYOSURGERY  age 37s  . TONSILLECTOMY  age 63  . TOTAL LAPAROSCOPIC HYSTERECTOMY WITH SALPINGECTOMY Bilateral 04/09/2019   Procedure: TOTAL LAPAROSCOPIC HYSTERECTOMY WITH SALPINGECTOMY;  Surgeon: [name], MD;  Location: [facility];  Service: Gynecology;  Laterality: Bilateral;  Has Mirena IUD in place, 3 hours surgery time. Extended recovery bed.  . TRANSTHORACIC ECHOCARDIOGRAM  01/17/2017   ef 60-65%/  mild TR    I have reviewed the social history and family history with the  patient and they are unchanged from previous note.  ALLERGIES:  is allergic to darvon [propoxyphene].  MEDICATIONS:  Current Outpatient Medications  Medication Sig Dispense Refill  . ADVAIR DISKUS 250-50 MCG/DOSE AEPB INHALE 1 PUFF INTO THE LUNGS TWICE DAILY AS NEEDED (Patient taking differently: Inhale 1 puff into the lungs 2 (two) times daily. ) 60 each 5  .  albuterol (VENTOLIN HFA) 108 (90 Base) MCG/ACT inhaler Inhale 2 puffs into the lungs every 6 (six) hours as needed. (Patient taking differently: Inhale 2 puffs into the lungs every 6 (six) hours as needed for wheezing or shortness of breath. ) 18 g 0  . baclofen (LIORESAL) 10 MG tablet TAKE 1 TABLET BY MOUTH THREE TIMES DAILY AS NEEDED FOR MUSCLE SPASMS (Patient taking differently: Take 5 mg by mouth 3 (three) times daily as needed for muscle spasms. ) 90 tablet 5  . benzonatate (TESSALON) 100 MG capsule Take 200 mg by mouth 3 (three) times daily as needed for cough.    . cetirizine (ZYRTEC) 10 MG tablet Take 1 tablet (10 mg total) by mouth every morning. (Patient taking differently: Take 10 mg by mouth daily. ) 30 tablet 5  . Cyanocobalamin (VITAMIN B 12 PO) Take 1,000 mg by mouth daily.     . DULoxetine (CYMBALTA) 60 MG capsule TAKE 1 CAPSULE(60 MG) BY MOUTH DAILY (Patient taking differently: Take 60 mg by mouth at bedtime. ) 30 capsule 2  . ELIQUIS 5 MG TABS tablet TAKE 1 TABLET(5 MG) BY MOUTH TWICE DAILY 60 tablet 5  . enoxaparin (LOVENOX) 40 MG/0.4ML injection Inject one 40mg /0.73ml injection q am for 14 days post op 5.6 mL 0  . fluticasone (FLONASE) 50 MCG/ACT nasal spray SHAKE LIQUID AND USE 1 SPRAY IN EACH NOSTRIL DAILY--  in am (Patient taking differently: Place 1 spray into both nostrils daily as needed for allergies. SHAKE LIQUID AND USE 1 SPRAY IN EACH NOSTRIL DAILY--  in am) 16 g 2  . folic acid (FOLVITE) 1 MG tablet Take 2 mg by mouth daily.     . hydroxychloroquine (PLAQUENIL) 200 MG tablet Take 400 mg by mouth at bedtime.     . methocarbamol (ROBAXIN) 500 MG tablet Take 1 tablet (500 mg total) by mouth 2 (two) times daily as needed for muscle spasms. 60 tablet 1  . methotrexate 2.5 MG tablet Take 15 mg by mouth once a week. Friday in the evening    . montelukast (SINGULAIR) 10 MG tablet Take 1 tablet (10 mg total) by mouth at bedtime. 30 tablet 5  . omeprazole (PRILOSEC) 40 MG capsule  TAKE 1 CAPSULE(40 MG) BY MOUTH DAILY (Patient taking differently: Take 40 mg by mouth daily. ) 30 capsule 5  . oxybutynin (DITROPAN) 5 MG tablet Take 5 mg by mouth 2 (two) times daily.   11  . oxyCODONE-acetaminophen (PERCOCET/ROXICET) 5-325 MG tablet Take 1-2 tablets by mouth every 4 (four) hours as needed for moderate pain. 30 tablet 0  . pregabalin (LYRICA) 75 MG capsule TAKE 1 CAPSULE BY MOUTH THREE TIMES DAILY (Patient taking differently: Take 75 mg by mouth 2 (two) times daily. Additional 75 mg if needed) 90 capsule 3  . timolol (TIMOPTIC) 0.5 % ophthalmic solution Place 1 drop into both eyes daily.     Marland Kitchen topiramate (TOPAMAX) 25 MG tablet Take 75 mg by mouth at bedtime.   1  . Vitamin D, Ergocalciferol, (DRISDOL) 1.25 MG (50000 UT) CAPS capsule TAKE 1 CAPSULE BY MOUTH EVERY 10 DAYS (Patient taking  differently: Take 50,000 Units by mouth every 7 (seven) days. Tuesday) 10 capsule 1   No current facility-administered medications for this visit.    PHYSICAL EXAMINATION: ECOG PERFORMANCE STATUS: 1 - Symptomatic but completely ambulatory  Vitals:   04/26/19 0821  BP: 104/84  Pulse: (!) 103  Resp: 18  Temp: 97.9 F (36.6 C)  SpO2: 98%   Filed Weights   04/26/19 0821  Weight: (!) 343 lb 12.8 oz (155.9 kg)    GENERAL:alert, no distress and comfortable SKIN: skin color, texture, turgor are normal, no rashes or significant lesions EYES: normal, Conjunctiva are pink and non-injected, sclera clear  NECK: supple, thyroid normal size, non-tender, without nodularity LYMPH:  no palpable lymphadenopathy in the cervical, axillary  LUNGS: clear to auscultation and percussion with normal breathing effort HEART: regular rate & rhythm and no murmurs and no lower extremity edema ABDOMEN:abdomen soft, non-tender and normal bowel sounds Musculoskeletal:no cyanosis of digits and no clubbing  NEURO: alert & oriented x 3 with fluent speech, no focal motor/sensory deficits  LABORATORY DATA:  I have  reviewed the data as listed CBC Latest Ref Rng & Units 04/26/2019 04/10/2019 04/04/2019  WBC 4.0 - 10.5 K/uL 6.0 11.2(H) 7.9  Hemoglobin 12.0 - 15.0 g/dL 13.3 12.2 13.5  Hematocrit 36.0 - 46.0 % 39.5 35.9(L) 40.6  Platelets 150 - 400 K/uL 273 213 225     CMP Latest Ref Rng & Units 04/26/2019 04/10/2019 04/04/2019  Glucose 70 - 99 mg/dL 100(H) 132(H) 107(H)  BUN 6 - 20 mg/dL 11 9 10   Creatinine 0.44 - 1.00 mg/dL 0.79 0.68 0.75  Sodium 135 - 145 mmol/L 140 137 137  Potassium 3.5 - 5.1 mmol/L 4.1 3.8 3.8  Chloride 98 - 111 mmol/L 108 107 108  CO2 22 - 32 mmol/L 22 20(L) 21(L)  Calcium 8.9 - 10.3 mg/dL 9.0 8.7(L) 9.1  Total Protein 6.5 - 8.1 g/dL 6.8 - -  Total Bilirubin 0.3 - 1.2 mg/dL 0.3 - -  Alkaline Phos 38 - 126 U/L 65 - -  AST 15 - 41 U/L 18 - -  ALT 0 - 44 U/L 20 - -      RADIOGRAPHIC STUDIES: I have personally reviewed the radiological images as listed and agreed with the findings in the report. No results found.   ASSESSMENT & PLAN:  Holly Gabrialle Divito is a 43 y.o. female with   1. Iron deficient anemia,secondary to her menorrhagia -She is on oral iron TID.  -She had an IUDMirenaplaced in 04/2018 and now has lighter menses. She understands no estrogen-containing birth control pill or IUD given her risk of thrombosis. -She underwent hysterectomy on 04/09/19. She is recovering well. Will monitor for menses.  -She is clinically doing well with baseline fatigue. Labs reviewed with patient, blood loss from surgery is minimal, anemia resolved. Iron panel still pending.  -Repeat lab in 6-12 months and will set up having her anemia and Eliquis monitored by Dr. Kathlene November.  -She will f/u with me as needed.    2. Unprovoked Pulmonary Embolism in 10/2016 -hypercoagulability work up was negative when she was diagnosed with PE. -Due to her family history of thrombosis in her mother, unprovoked PE, and her obesity, she is at high risk for recurrent thrombosis. I recommend  continue anticoagulation indefinitely if there is no contraindication. -ContinueEliquis 5 mg q12h, and baby aspirin. -She was recently diagnosed with an autoimmune disorder, for which she was taking Plaquenil, now methotrexate. I educated her that this medication can  lower her blood countsslightly.  3. Anxietyand Fibromyalgia  -f/u with Dr. Martinique her PCP and her Rheumatologist Dr. Lahoma Rocker -She is currently having a fibromyalgia flare which she is not have often. She plans to go home and medicate   4. Obesity -I encouragedher to exercise to lose weight and prevent PE recurrence   Plan -Labs reviewed, ferritin 89 today, no need iv iron. Copy labs to Dr. Kathlene November  -Continue oral iron TID  -Continue Eliquis indefinitely -Lab CBC and ferritin with Dr. Kathlene November in 6-12 months  -F/u with me as needed   No problem-specific Assessment & Plan notes found for this encounter.   No orders of the defined types were placed in this encounter.  All questions were answered. The patient knows to call the clinic with any problems, questions or concerns. No barriers to learning was detected. I spent 15 minutes counseling the patient face to face. The total time spent in the appointment was 20 minutes and more than 50% was on counseling and review of test results     Truitt Merle, MD 04/26/2019   I, Joslyn Devon, am acting as scribe for Truitt Merle, MD.   I have reviewed the above documentation for accuracy and completeness, and I agree with the above.

## 2019-04-26 ENCOUNTER — Inpatient Hospital Stay: Payer: 59 | Attending: Hematology

## 2019-04-26 ENCOUNTER — Encounter: Payer: Self-pay | Admitting: Hematology

## 2019-04-26 ENCOUNTER — Other Ambulatory Visit: Payer: Self-pay

## 2019-04-26 ENCOUNTER — Inpatient Hospital Stay (HOSPITAL_BASED_OUTPATIENT_CLINIC_OR_DEPARTMENT_OTHER): Payer: 59 | Admitting: Hematology

## 2019-04-26 ENCOUNTER — Ambulatory Visit (INDEPENDENT_AMBULATORY_CARE_PROVIDER_SITE_OTHER): Payer: 59 | Admitting: Obstetrics & Gynecology

## 2019-04-26 ENCOUNTER — Encounter: Payer: Self-pay | Admitting: Obstetrics & Gynecology

## 2019-04-26 ENCOUNTER — Ambulatory Visit: Payer: 59 | Admitting: Obstetrics & Gynecology

## 2019-04-26 VITALS — BP 104/84 | HR 103 | Temp 97.9°F | Resp 18 | Ht 70.0 in | Wt 343.8 lb

## 2019-04-26 VITALS — BP 120/84 | HR 84 | Temp 97.6°F | Resp 14 | Ht 70.0 in | Wt 346.0 lb

## 2019-04-26 DIAGNOSIS — J449 Chronic obstructive pulmonary disease, unspecified: Secondary | ICD-10-CM | POA: Insufficient documentation

## 2019-04-26 DIAGNOSIS — D500 Iron deficiency anemia secondary to blood loss (chronic): Secondary | ICD-10-CM | POA: Diagnosis present

## 2019-04-26 DIAGNOSIS — Z86711 Personal history of pulmonary embolism: Secondary | ICD-10-CM | POA: Insufficient documentation

## 2019-04-26 DIAGNOSIS — K219 Gastro-esophageal reflux disease without esophagitis: Secondary | ICD-10-CM | POA: Insufficient documentation

## 2019-04-26 DIAGNOSIS — Z9079 Acquired absence of other genital organ(s): Secondary | ICD-10-CM | POA: Insufficient documentation

## 2019-04-26 DIAGNOSIS — Z8249 Family history of ischemic heart disease and other diseases of the circulatory system: Secondary | ICD-10-CM | POA: Insufficient documentation

## 2019-04-26 DIAGNOSIS — Z79899 Other long term (current) drug therapy: Secondary | ICD-10-CM | POA: Insufficient documentation

## 2019-04-26 DIAGNOSIS — Z7901 Long term (current) use of anticoagulants: Secondary | ICD-10-CM | POA: Diagnosis not present

## 2019-04-26 DIAGNOSIS — Z86718 Personal history of other venous thrombosis and embolism: Secondary | ICD-10-CM

## 2019-04-26 DIAGNOSIS — Z90710 Acquired absence of both cervix and uterus: Secondary | ICD-10-CM | POA: Diagnosis not present

## 2019-04-26 DIAGNOSIS — M797 Fibromyalgia: Secondary | ICD-10-CM | POA: Insufficient documentation

## 2019-04-26 DIAGNOSIS — M1990 Unspecified osteoarthritis, unspecified site: Secondary | ICD-10-CM | POA: Insufficient documentation

## 2019-04-26 DIAGNOSIS — N920 Excessive and frequent menstruation with regular cycle: Secondary | ICD-10-CM | POA: Insufficient documentation

## 2019-04-26 DIAGNOSIS — E669 Obesity, unspecified: Secondary | ICD-10-CM | POA: Insufficient documentation

## 2019-04-26 DIAGNOSIS — F419 Anxiety disorder, unspecified: Secondary | ICD-10-CM | POA: Insufficient documentation

## 2019-04-26 DIAGNOSIS — Z9889 Other specified postprocedural states: Secondary | ICD-10-CM

## 2019-04-26 LAB — CBC WITH DIFFERENTIAL/PLATELET
Abs Immature Granulocytes: 0.02 10*3/uL (ref 0.00–0.07)
Basophils Absolute: 0.1 10*3/uL (ref 0.0–0.1)
Basophils Relative: 1 %
Eosinophils Absolute: 0.1 10*3/uL (ref 0.0–0.5)
Eosinophils Relative: 1 %
HCT: 39.5 % (ref 36.0–46.0)
Hemoglobin: 13.3 g/dL (ref 12.0–15.0)
Immature Granulocytes: 0 %
Lymphocytes Relative: 31 %
Lymphs Abs: 1.8 10*3/uL (ref 0.7–4.0)
MCH: 30.9 pg (ref 26.0–34.0)
MCHC: 33.7 g/dL (ref 30.0–36.0)
MCV: 91.9 fL (ref 80.0–100.0)
Monocytes Absolute: 0.4 10*3/uL (ref 0.1–1.0)
Monocytes Relative: 7 %
Neutro Abs: 3.6 10*3/uL (ref 1.7–7.7)
Neutrophils Relative %: 60 %
Platelets: 273 10*3/uL (ref 150–400)
RBC: 4.3 MIL/uL (ref 3.87–5.11)
RDW: 12.4 % (ref 11.5–15.5)
WBC: 6 10*3/uL (ref 4.0–10.5)
nRBC: 0 % (ref 0.0–0.2)

## 2019-04-26 LAB — COMPREHENSIVE METABOLIC PANEL
ALT: 20 U/L (ref 0–44)
AST: 18 U/L (ref 15–41)
Albumin: 3.7 g/dL (ref 3.5–5.0)
Alkaline Phosphatase: 65 U/L (ref 38–126)
Anion gap: 10 (ref 5–15)
BUN: 11 mg/dL (ref 6–20)
CO2: 22 mmol/L (ref 22–32)
Calcium: 9 mg/dL (ref 8.9–10.3)
Chloride: 108 mmol/L (ref 98–111)
Creatinine, Ser: 0.79 mg/dL (ref 0.44–1.00)
GFR calc Af Amer: >60 mL/min (ref >60)
GFR calc non Af Amer: >60 mL/min (ref >60)
Glucose, Bld: 100 mg/dL — ABNORMAL HIGH (ref 70–99)
Potassium: 4.1 mmol/L (ref 3.5–5.1)
Sodium: 140 mmol/L (ref 135–145)
Total Bilirubin: 0.3 mg/dL (ref 0.3–1.2)
Total Protein: 6.8 g/dL (ref 6.5–8.1)

## 2019-04-26 LAB — IRON AND TIBC
Iron: 59 ug/dL (ref 41–142)
Saturation Ratios: 19 % — ABNORMAL LOW (ref 21–57)
TIBC: 314 ug/dL (ref 236–444)
UIBC: 255 ug/dL (ref 120–384)

## 2019-04-26 LAB — FERRITIN: Ferritin: 89 ng/mL (ref 11–307)

## 2019-04-26 NOTE — Progress Notes (Signed)
Post Operative Visit  Procedure:TOTAL LAPAROSCOPIC HYSTERECTOMY WITH SALPINGECTOMY (Bilateral Abdomen) CYSTOSCOPY (N/A Bladder) Days Post-op: 17  Subjective: Pt is planning on returning to work so needed to be seen.  Feels good.  Off pain medication.  Bowel and bladder function is normal.  Having minimal spotting.  Denies vaginal odor.  Objective: BP 120/84 (BP Location: Right Arm, Patient Position: Sitting, Cuff Size: Large)   Pulse 84   Temp 97.6 F (36.4 C) (Skin)   Resp 14   Ht 5\' 10"  (1.778 m)   Wt (!) 346 lb (156.9 kg)   LMP 03/05/2019   BMI 49.65 kg/m   EXAM General: alert and no distress GI: soft, non-tender; bowel sounds normal; no masses,  no organomegaly and incision: clean, dry and intact Extremities: extremities normal, atraumatic, no cyanosis or edema Vaginal Bleeding: none Gyn: NAEFG, no lesions, cuff intact, no abnormal discharge or odor, no fullness on BME  Assessment: s/p TLH/bilateral salpingectomy/cystoscopy  H/o PE, has finished lovenox and is back on Eliquis Mixed connective tissue d/o Obesity  Plan: Recheck 4 weeks She can return to work as she works from home.  Lifting restrictions and pelvic rest limitations still remain

## 2019-04-27 ENCOUNTER — Telehealth: Payer: Self-pay | Admitting: Hematology

## 2019-04-27 ENCOUNTER — Telehealth: Payer: Self-pay

## 2019-04-27 ENCOUNTER — Other Ambulatory Visit: Payer: Self-pay

## 2019-04-27 DIAGNOSIS — K219 Gastro-esophageal reflux disease without esophagitis: Secondary | ICD-10-CM

## 2019-04-27 MED ORDER — OMEPRAZOLE 40 MG PO CPDR: DELAYED_RELEASE_CAPSULE | ORAL | 2 refills | Status: AC

## 2019-04-27 NOTE — Telephone Encounter (Signed)
Per 12/17 los f/u open

## 2019-04-27 NOTE — Telephone Encounter (Signed)
Faxed office visit note from 12/17 to Dr. Lahoma Rocker at fax [phone]

## 2019-05-01 ENCOUNTER — Other Ambulatory Visit: Payer: Self-pay | Admitting: Family Medicine

## 2019-05-01 ENCOUNTER — Other Ambulatory Visit: Payer: Self-pay | Admitting: Physical Medicine & Rehabilitation

## 2019-05-01 DIAGNOSIS — E559 Vitamin D deficiency, unspecified: Secondary | ICD-10-CM

## 2019-05-16 ENCOUNTER — Encounter: Payer: Self-pay | Admitting: Adult Health

## 2019-05-16 ENCOUNTER — Other Ambulatory Visit: Payer: Self-pay

## 2019-05-16 ENCOUNTER — Ambulatory Visit (INDEPENDENT_AMBULATORY_CARE_PROVIDER_SITE_OTHER): Payer: 59 | Admitting: Adult Health

## 2019-05-16 DIAGNOSIS — I2699 Other pulmonary embolism without acute cor pulmonale: Secondary | ICD-10-CM

## 2019-05-16 DIAGNOSIS — G4733 Obstructive sleep apnea (adult) (pediatric): Secondary | ICD-10-CM

## 2019-05-16 DIAGNOSIS — J45909 Unspecified asthma, uncomplicated: Secondary | ICD-10-CM | POA: Diagnosis not present

## 2019-05-16 DIAGNOSIS — J309 Allergic rhinitis, unspecified: Secondary | ICD-10-CM

## 2019-05-16 NOTE — Progress Notes (Signed)
Virtual Visit via Telephone Note  I connected with Holly Mcdonald on 05/16/19 at  9:30 AM EST by telephone and verified that I am speaking with the correct person using two identifiers.  Location: Patient: Home   Provider: Office    I discussed the limitations, risks, security and privacy concerns of performing an evaluation and management service by telephone and the availability of in person appointments. I also discussed with the patient that there may be a patient responsible charge related to this service. The patient expressed understanding and agreed to proceed.   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    Patient returns for a 26-month follow-up.  Recent home sleep study showed mild to moderate sleep apnea with an AHI at 9.3/hour (events were worse in supine position with AHI 16/hours).  Patient was started on nocturnal CPAP.  Patient says she is doing better on CPAP.  She recently changed to extra small female with nasal pillows and these have worked very well for her.  She tries to wear her CPAP every night.  Her download shows excellent compliance with average usage at 97%.  Daily average usage at 6.5 hours.  Patient is on auto CPAP 5 to 15 cm H2O.  AHI 2.7.  Patient says she does feel better since starting to wear this.  She has less daytime sleepiness and feels more rested.   She feels that she benefits from her CPAP.  Patient does have a history of unprovoked PE.  She is on lifelong Eliquis.  She says she is doing well with no known bleeding.  She recently had surgery with a hysterectomy in November 2020.  Says she is recovering well.  She is returned back to work.   Patient Active Problem List   Diagnosis Date Noted  . OSA (obstructive sleep apnea) 03/01/2019  . Daytime sleepiness 01/26/2019  . Sleep disorder 12/14/2018  . Fibromyalgia 08/16/2018  . Autoimmune disease ([place]) 03/24/2018  . History of pulmonary embolus (PE) 03/24/2018  . B12 deficiency 02/15/2018  . Connective tissue disease ([place]) 02/08/2018  . Inflammatory arthritis 02/08/2018  . Chronic pain syndrome 12/16/2017  . Incontinence of urine in female 09/16/2017  . Glaucoma, open angle, mild stage 07/27/2017  . Polyarthralgia 07/06/2017  . Retinal hemorrhage noted on examination of both eyes 06/30/2017  . Allergic rhinitis 03/07/2017  . Hyperlipidemia 02/24/2017  . Pre-diabetes 02/24/2017  . Anxiety disorder 01/24/2017  . Fibromyalgia muscle pain 01/24/2017  . Shortness of breath 01/13/2017  . Costochondritis 01/13/2017  . Family history of premature CAD 01/13/2017  . GERD (gastroesophageal reflux disease) 12/21/2016  . Vitamin D deficiency, unspecified 12/21/2016  . Iron deficiency anemia due to chronic blood loss 12/20/2016  . Asthma in adult, unspecified asthma severity, uncomplicated [id number]  . Obesity, Class III, BMI 40-49.9 (morbid obesity) ([name]) 11/04/2016  . Pulmonary embolus ([place])   . Bilateral pulmonary embolism ([place]) 11/03/2016   Current Outpatient Medications on File Prior to Visit  Medication Sig Dispense Refill  . ADVAIR  DISKUS 250-50 MCG/DOSE AEPB INHALE 1 PUFF INTO THE LUNGS TWICE DAILY AS NEEDED (Patient taking differently: Inhale 1 puff into the lungs 2 (two) times daily. ) 60 each 5  . albuterol (VENTOLIN HFA) 108 (90 Base) MCG/ACT inhaler Inhale 2 puffs into the  lungs every 6 (six) hours as needed. (Patient taking differently: Inhale 2 puffs into the lungs every 6 (six) hours as needed for wheezing or shortness of breath. ) 18 g 0  . baclofen (LIORESAL) 10 MG tablet TAKE 1 TABLET BY MOUTH THREE TIMES DAILY AS NEEDED FOR MUSCLE SPASMS (Patient taking differently: Take 5 mg by mouth 3 (three) times daily as needed for muscle spasms. ) 90 tablet 5  . benzonatate (TESSALON) 100 MG capsule Take 200 mg by mouth 3 (three) times daily as needed for cough.    . cetirizine (ZYRTEC) 10 MG tablet Take 1 tablet (10 mg total) by mouth every morning. (Patient taking differently: Take 10 mg by mouth daily. ) 30 tablet 5  . Cyanocobalamin (VITAMIN B 12 PO) Take 1,000 mg by mouth daily.     . DULoxetine (CYMBALTA) 60 MG capsule TAKE 1 CAPSULE(60 MG) BY MOUTH DAILY 30 capsule 2  . ELIQUIS 5 MG TABS tablet TAKE 1 TABLET(5 MG) BY MOUTH TWICE DAILY 60 tablet 5  . enoxaparin (LOVENOX) 40 MG/0.4ML injection Inject one 40mg /0.75ml injection q am for 14 days post op 5.6 mL 0  . fluticasone (FLONASE) 50 MCG/ACT nasal spray SHAKE LIQUID AND USE 1 SPRAY IN EACH NOSTRIL DAILY--  in am (Patient taking differently: Place 1 spray into both nostrils daily as needed for allergies. SHAKE LIQUID AND USE 1 SPRAY IN EACH NOSTRIL DAILY--  in am) 16 g 2  . folic acid (FOLVITE) 1 MG tablet Take 2 mg by mouth daily.     . hydroxychloroquine (PLAQUENIL) 200 MG tablet Take 400 mg by mouth at bedtime.     . methocarbamol (ROBAXIN) 500 MG tablet Take 1 tablet (500 mg total) by mouth 2 (two) times daily as needed for muscle spasms. 60 tablet 1  . methotrexate 2.5 MG tablet Take 15 mg by mouth once a week. Friday in the evening    . montelukast (SINGULAIR) 10 MG tablet Take 1 tablet (10 mg total) by mouth at bedtime. 30 tablet 5  . omeprazole (PRILOSEC) 40 MG capsule TAKE 1 CAPSULE(40 MG) BY MOUTH DAILY 90 capsule 2  . oxybutynin (DITROPAN) 5 MG tablet Take 5 mg by mouth 2 (two) times daily.   11  .  oxyCODONE-acetaminophen (PERCOCET/ROXICET) 5-325 MG tablet Take 1-2 tablets by mouth every 4 (four) hours as needed for moderate pain. 30 tablet 0  . pregabalin (LYRICA) 75 MG capsule TAKE 1 CAPSULE BY MOUTH THREE TIMES DAILY (Patient taking differently: Take 75 mg by mouth 2 (two) times daily. Additional 75 mg if needed) 90 capsule 3  . timolol (TIMOPTIC) 0.5 % ophthalmic solution Place 1 drop into both eyes daily.     Marland Kitchen topiramate (TOPAMAX) 25 MG tablet Take 75 mg by mouth at bedtime.   1  . Vitamin D, Ergocalciferol, (DRISDOL) 1.25 MG (50000 UT) CAPS capsule TAKE 1 CAPSULE BY MOUTH EVERY 10 DAYS 10 capsule 1   No current facility-administered medications on file prior to visit.    Observations/Objective: 05/16/2019 -speaks in full sentences no audible wheezing.  Sounds comfortable   Home sleep study February 14, 2019 showed mild to moderate obstructive sleep apnea with AHI 9.3/hour (events were worse in supine position-16/hours)  6/2018CT angiogram of the chest showed acute segmental pulmonary emboli in bilateral lower lobes, no CT evidence of right heart strain. Doppler lower extremity was negative for DVT. Hypercoagulable panel negative  Spirometry 12/2016 >. FEV 1 90%  PFTs today show normal lung function with no airflow obstruction or restriction. FEV1 99%, ratio 90, FVC 90%, no significant bronchodilator response, normal mid flows. DLCO 106%   Assessment and Plan: Obstructive sleep apnea with excellent control and compliance on nocturnal CPAP.  Patient continue on current settings.  Patient is to continue to wear each night.  Do not drive if sleepy.  Follow-up in 6 months.  Asthma and allergic rhinitis.  Currently well controlled.  Continue follow-up with asthma and allergy  Previous unprovoked PE on lifelong anticoagulation with Eliquis.  Appears to be doing well.  No changes in therapy  Plan  Patient Instructions  Continue CPAP At bedtime  .  Try to wear for at least 4hr  or more each and every night .  Work on healthy weight loss.  Do not drive if sleepy .  Continue on Advair 1 puff Twice daily  ,rinse after use.  Continue on Singulair 10mg  At bedtime   Continue on Zyrtec 10mg  At bedtime   Continue on Flonase daily  Ventolin  As needed  Wheezing  Continue on Eliquis Follow up with Dr. Elsworth Soho or Brandom Kerwin NP  in 6 months and As needed           Follow Up Instructions: Follow-up in 6 months and as needed   I discussed the assessment and treatment plan with the patient. The patient was provided an opportunity to ask questions and all were answered. The patient agreed with the plan and demonstrated an understanding of the instructions.   The patient was advised to call back or seek an in-person evaluation if the symptoms worsen or if the condition fails to improve as anticipated.  I provided 22 minutes of non-face-to-face time during this encounter.   Rexene Edison, NP

## 2019-05-16 NOTE — Patient Instructions (Signed)
Continue CPAP At bedtime  .  Try to wear for at least 4hr or more each and every night .  Work on healthy weight loss.  Do not drive if sleepy .  Continue on Advair 1 puff Twice daily  ,rinse after use.  Continue on Singulair 10mg  At bedtime   Continue on Zyrtec 10mg  At bedtime   Continue on Flonase daily  Ventolin  As needed  Wheezing  Continue on Eliquis Follow up with Dr. Elsworth Soho or Beaulah Romanek NP  in 6 months and As needed

## 2019-05-22 NOTE — Progress Notes (Deleted)
Post Operative Visit  Procedure:*** Days Post-op: ***  Subjective: ***  Objective: LMP 03/05/2019   EXAM General: {Exam; general:16600} Resp: {Exam; lung:16931} Cardio: {Exam; heart:5510} GI: {Exam, XF:8167074 Extremities: {Exam; extremity:5109} Vaginal Bleeding: {exam; vaginal bleeding:3041122}  Assessment: s/p ***  Plan: Recheck {NUMBER 1-10:22536} weeks ***

## 2019-05-23 ENCOUNTER — Other Ambulatory Visit: Payer: Self-pay

## 2019-05-25 ENCOUNTER — Other Ambulatory Visit: Payer: Self-pay

## 2019-05-25 ENCOUNTER — Encounter: Payer: Self-pay | Admitting: Obstetrics & Gynecology

## 2019-05-25 ENCOUNTER — Ambulatory Visit (INDEPENDENT_AMBULATORY_CARE_PROVIDER_SITE_OTHER): Payer: 59 | Admitting: Obstetrics & Gynecology

## 2019-05-25 VITALS — BP 128/76 | HR 72 | Temp 97.0°F | Resp 14 | Ht 70.0 in | Wt 339.0 lb

## 2019-05-25 DIAGNOSIS — Z9889 Other specified postprocedural states: Secondary | ICD-10-CM

## 2019-05-25 NOTE — Progress Notes (Addendum)
Post Operative Visit  Procedure:TOTAL LAPAROSCOPIC HYSTERECTOMY WITH SALPINGECTOMY (Bilateral Abdomen) CYSTOSCOPY (N/A Bladder) Days Post-op: 45 days  Subjective: Doing well.  Denies vaginal bleeding.  Energy is back to baseline.  Denies vaginal discharge.  Denies any new bowel or bladder issues.  She is back working.    Objective: BP 128/76 (BP Location: Left Arm, Patient Position: Sitting, Cuff Size: Large)   Pulse 72   Temp (!) 97 F (36.1 C) (Skin)   Resp 14   Ht 5\' 10"  (1.778 m)   Wt (!) 339 lb (153.8 kg)   LMP 03/05/2019   BMI 48.64 kg/m   EXAM General: alert, cooperative and no distress Resp: clear to auscultation bilaterally Cardio: regular rate and rhythm, S1, S2 normal, no murmur, click, rub or gallop GI: soft, non-tender; bowel sounds normal; no masses,  no organomegaly Extremities: extremities normal, atraumatic, no cyanosis or edema Vaginal Bleeding: none  Gyn:  NAEFG, cuff intact, no fullness,   Assessment: s/p TLH/bilateral salpingectomy/cystoscopy  Plan: Recheck 1 year.   H/o +HR HPV pre-op She is clearly aware of recommendation for pelvic rest for full 12 weeks post op

## 2019-05-31 ENCOUNTER — Encounter: Payer: 59 | Admitting: Obstetrics & Gynecology

## 2019-06-21 ENCOUNTER — Other Ambulatory Visit: Payer: Self-pay

## 2019-06-21 ENCOUNTER — Encounter: Payer: Self-pay | Admitting: Allergy

## 2019-06-21 ENCOUNTER — Ambulatory Visit (INDEPENDENT_AMBULATORY_CARE_PROVIDER_SITE_OTHER): Payer: 59 | Admitting: Allergy

## 2019-06-21 DIAGNOSIS — J3089 Other allergic rhinitis: Secondary | ICD-10-CM | POA: Diagnosis not present

## 2019-06-21 DIAGNOSIS — J4530 Mild persistent asthma, uncomplicated: Secondary | ICD-10-CM

## 2019-06-21 MED ORDER — FLUTICASONE PROPIONATE 50 MCG/ACT NA SUSP: NASAL | 5 refills | Status: DC

## 2019-06-21 MED ORDER — MONTELUKAST SODIUM 10 MG PO TABS: 10.0000 mg | ORAL_TABLET | Freq: Every day | ORAL | 5 refills | Status: DC

## 2019-06-21 MED ORDER — MUPIROCIN CALCIUM 2 % NA OINT: 1.0000 "application " | TOPICAL_OINTMENT | Freq: Two times a day (BID) | NASAL | 0 refills | Status: DC

## 2019-06-21 MED ORDER — ALBUTEROL SULFATE HFA 108 (90 BASE) MCG/ACT IN AERS: 2.0000 | INHALATION_SPRAY | RESPIRATORY_TRACT | 1 refills | Status: DC | PRN

## 2019-06-21 MED ORDER — FLUTICASONE-SALMETEROL 250-50 MCG/DOSE IN AEPB: INHALATION_SPRAY | RESPIRATORY_TRACT | 5 refills | Status: DC
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
## 2019-06-21 NOTE — Patient Instructions (Signed)
Allergic rhinitis with conjunctivitis and migraines     - continue avoidance measures for tree pollen, molds, dust mites and cockroach.     - continue use of Cetirizine 10mg  daily    - continue Singulair 10mg  daily at night    - continue Fluticasone 1-2 sprays each nostril daily for nasal congestion    - consider allergen immunotherapy (allergy shots) if medication management becomes ineffective in controlling allergy symptoms.     - for nasal sores use Mupirocin ointment apply to the nose with q-tip twice a day for next 10 days.    Asthma, adult-onset   - lung function testing from 01/2019 looked great    - continue Advair diskus 250-67mcg  1 puff twice a day.      - have access to Ventolin inhaler 2 puffs every 4-6 hours as needed for cough/wheeze/shortness of breath/chest tightness.  May use 15-20 minutes prior to activity.   Monitor frequency of use.    Asthma control goals:   Full participation in all desired activities (may need albuterol before activity)  Albuterol use two time or less a week on average (not counting use with activity)  Cough interfering with sleep two time or less a month  Oral steroids no more than once a year  No hospitalizations    Follow-up 6 months or sooner if needed

## 2019-06-22 ENCOUNTER — Ambulatory Visit: Payer: 59 | Admitting: Physical Medicine & Rehabilitation

## 2019-06-26 ENCOUNTER — Encounter: Payer: Self-pay | Admitting: Physical Medicine & Rehabilitation

## 2019-06-26 ENCOUNTER — Other Ambulatory Visit: Payer: Self-pay

## 2019-06-26 ENCOUNTER — Encounter: Payer: 59 | Attending: Physical Medicine & Rehabilitation | Admitting: Physical Medicine & Rehabilitation

## 2019-06-26 VITALS — BP 125/86 | HR 103 | Temp 97.3°F | Ht 70.0 in | Wt 333.0 lb

## 2019-06-26 DIAGNOSIS — M797 Fibromyalgia: Secondary | ICD-10-CM | POA: Insufficient documentation

## 2019-06-26 DIAGNOSIS — G4733 Obstructive sleep apnea (adult) (pediatric): Secondary | ICD-10-CM | POA: Diagnosis present

## 2019-06-26 DIAGNOSIS — M1990 Unspecified osteoarthritis, unspecified site: Secondary | ICD-10-CM | POA: Diagnosis present

## 2019-06-26 DIAGNOSIS — M359 Systemic involvement of connective tissue, unspecified: Secondary | ICD-10-CM | POA: Diagnosis not present

## 2019-06-26 DIAGNOSIS — G894 Chronic pain syndrome: Secondary | ICD-10-CM | POA: Diagnosis not present

## 2019-06-26 DIAGNOSIS — R269 Unspecified abnormalities of gait and mobility: Secondary | ICD-10-CM | POA: Diagnosis not present

## 2019-06-26 MED ORDER — PREGABALIN 75 MG PO CAPS: 75.0000 mg | ORAL_CAPSULE | Freq: Two times a day (BID) | ORAL | 5 refills | Status: DC

## 2019-06-26 NOTE — Progress Notes (Signed)
Subjective:    Patient ID: Holly Mcdonald, female    DOB: 12-19-1975, 44 y.o.   MRN: [id number]  HPI Female with past medical history of tremors, pulmonary embolism, morbid obesity, migraines, fibromyalgia, prediabetes, asthma, fibroids, chronic bronchitis presents with fibromyalgia.  Initially stated: Started 10/2016 after diagnosed PE.  Progressively getting worse.  Heat improved the pain.  Fibroids have exacerbated the pain, with surgery planned for 10/17/17.  Activity exacerbates the pain.  All qualities of pain.  Non-radiating.  Constant.  Associated muscle spasms and tingling.  Tylenol does not help.  Cymbalta provides some benefit. Denies falls. Pain limits all activities.   Works as Therapist, art.    Last clinic visit 12/14/19.  Since that time, she had hysterectomy, notes reviewed. She is doing well with Lyrica.  She states she has to remember to use TENS.  She forgot to use Lidoderm patches.  She is occasionally using Baclofen at night.  She notes overall improvement. She is taking Cymbalta. She was diagnosed with OSA. Denies falls. She states she is going to start weight more wait after social issues improve. Headaches are controlled with Topamax.   Pain Inventory Average Pain 3 Pain Right Now 3 My pain is intermittent, sharp, burning, dull, tingling and aching  In the last 24 hours, has pain interfered with the following? General activity 2 Relation with others 1 Enjoyment of life 1 What TIME of day is your pain at its worst? all Sleep (in general) Fair  Pain is worse with: walking, sitting, inactivity, standing and some activites Pain improves with: rest, heat/ice, pacing activities and medication Relief from Meds: 8  Mobility walk without assistance how many minutes can you walk? 15-20 ability to climb steps?  yes do you drive?  yes transfers alone Do you have any goals in this area?  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  no  Physicians involved in your care Any changes since last visit?  no Dr. Maren Beach   Family History  Problem Relation Age of Onset  . Pulmonary embolism Mother        Last year  . Mental retardation Mother        PTSD and fibromyalgia  . Heart disease Mother        CHF  . Congestive Heart Failure Mother   . Heart disease Father 56       CAD  . Hyperlipidemia Father   . Diabetes Paternal Grandmother   . Heart attack Paternal Grandmother   . Congestive Heart Failure Maternal Aunt   . Cancer Neg Hx    Social History   Socioeconomic History  . Marital status: Divorced    Spouse name: Not on file  . Number of children: Not on file  . Years of education: Not on file  . Highest education level: Not on file  Occupational History  . Occupation: representative    Comment: [employer]  Tobacco Use  . Smoking status: Never Smoker  . Smokeless tobacco: Never Used  Substance and Sexual Activity  . Alcohol use: Yes    Comment: occasionally  . Drug use: No  . Sexual activity: Yes    Birth control/protection: I.U.D.    Comment: 1st intercourse 44 yo-More than 5 partners Mirena 09/16/2017  Other Topics Concern  . Not on file  Social History Narrative  . Not on file  Social Determinants of Health   Financial Resource Strain:   . Difficulty of Paying Living Expenses: Not on file  Food Insecurity:   . Worried About Charity fundraiser in the Last Year: Not on file  . Ran Out of Food in the Last Year: Not on file  Transportation Needs:   . Lack of Transportation (Medical): Not on file  . Lack of Transportation (Non-Medical): Not on file  Physical Activity:   . Days of Exercise per Week: Not on file  . Minutes of Exercise per Session: Not on file  Stress:   . Feeling of Stress : Not on file  Social Connections:   . Frequency of Communication with Friends and  Family: Not on file  . Frequency of Social Gatherings with Friends and Family: Not on file  . Attends Religious Services: Not on file  . Active Member of Clubs or Organizations: Not on file  . Attends Archivist Meetings: Not on file  . Marital Status: Not on file   Past Surgical History:  Procedure Laterality Date  . ANAL EXAMINATION UNDER ANESTHESIA  age 45   w/ removal cyst  . CYSTOSCOPY N/A 04/09/2019   Procedure: CYSTOSCOPY;  Surgeon: [name], MD;  Location: [place];  Service: Gynecology;  Laterality: N/A;  . DILATATION & CURETTAGE/HYSTEROSCOPY WITH MYOSURE N/A 10/17/2017   Procedure: DILATATION & CURETTAGE/HYSTEROSCOPY WITH MYOSURE;  Surgeon: Anastasio Auerbach, MD;  Location: [place];  Service: Gynecology;  Laterality: N/A;  . DILATATION & CURETTAGE/HYSTEROSCOPY WITH MYOSURE N/A 04/11/2018   Procedure: DILATATION & CURETTAGE/HYSTEROSCOPY WITH MYOSURE;  Surgeon: Anastasio Auerbach, MD;  Location: [place];  Service: Gynecology;  Laterality: N/A;  . GYNECOLOGIC CRYOSURGERY  age 8s  . TONSILLECTOMY  age 65  . TOTAL LAPAROSCOPIC HYSTERECTOMY WITH SALPINGECTOMY Bilateral 04/09/2019   Procedure: TOTAL LAPAROSCOPIC HYSTERECTOMY WITH SALPINGECTOMY;  Surgeon: [name], MD;  Location: [place];  Service: Gynecology;  Laterality: Bilateral;  Has Mirena IUD in place, 3 hours surgery time. Extended recovery bed.  . TRANSTHORACIC ECHOCARDIOGRAM  01/17/2017   ef 60-65%/  mild TR   Past Medical History:  Diagnosis Date  . Anticoagulated    eliquis  . Arthritis    inflammatory  . 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 Bladder spasm   . Fibromyalgia   . GERD (gastroesophageal reflux disease)   . History of chronic bronchitis    last flare up 11-18-17  . History of pulmonary embolism 10/2016--- followed by pcp and pulmoloigst   dx bilateral PE , bilateral lower lobes , segmental , unprovoked, negative hypercoagulable  work-up-- treated w/ eliquis  . Iron deficiency anemia   . Migraine   . Mild intermittent asthma    pulmologist-  dr Elsworth Soho  . Mixed connective tissue disease ([place])   . Morbid obesity with BMI of 45.0-49.9, adult ([place])   . Open-angle glaucoma of both eyes   . Pre-diabetes   . 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 Sleep apnea   . Uterine fibroid   . Vitamin D deficiency    BP 125/86   Pulse (!) 103   Temp (!) 97.3 F (36.3 C)   Ht 5\' 10"  (1.778 m)   Wt (!) 333 lb (151 kg)   LMP 03/05/2019   SpO2 99%   BMI 47.78 kg/m   Opioid Risk Score:   Fall Risk Score:  `1  Depression screen PHQ  2/9  Depression screen [facility] 2/9 05/17/2018 01/12/2018 10/07/2017  Decreased Interest 0 0 0  Down, Depressed, Hopeless 0 0 0  PHQ - 2 Score 0 0 0  Altered sleeping - - 1  Tired, decreased energy - - 3  Change in appetite - - 3  Feeling bad or failure about yourself  - - 0  Trouble concentrating - - 0  Moving slowly or fidgety/restless - - 0  Suicidal thoughts - - 0  PHQ-9 Score - - 7  Difficult doing work/chores - - Somewhat difficult  Some recent data might be hidden   Review of Systems  Constitutional: Negative.   HENT: Negative.   Eyes: Negative.   Respiratory: Positive for apnea.   Cardiovascular: Negative.   Gastrointestinal: Negative.   Endocrine: Negative.   Genitourinary: Positive for difficulty urinating.  Musculoskeletal: Positive for arthralgias, back pain, myalgias and neck pain.  Skin: Negative.   Allergic/Immunologic: Negative.   Neurological: Positive for tremors.       Tingling  Hematological: Negative.   Psychiatric/Behavioral: The patient is nervous/anxious.       Objective:   Physical Exam Constitutional: No distress .  Respiratory: Normal effort.   Psych: Normal mood.  Normal behavior. Musc: Gait mildly antalgic  +TTP with delayed hypersensitivity, with some improvement Neuro: Alert  Strength: 4/5 grossly in bilateral upper  extremities (limited by pain inhibition/guarding)    Assessment & Plan:  Female with past medical history of tremors, pulmonary embolism, morbid obesity, migraines, fibromyalgia, prediabetes, asthma, fibroids, chronic bronchitis presents with pain all over.   1. Chronic diffuse pain  Multifactorial - Fibromyalgia, connective tissue disorder, inflammatory arthritis, fibroids s/p hysterectomy  Side effects with Gabapentin  Neck xray reviewed, showing mild disc space narrowing at C7-T1.  Cont Heat  Encouraged pool therapy (completed aquatic therapy), reminded again after CoVid.  Cont TENS IT with benefit, reminded  Cont Lidoderm OTC, reminded again  Cont Lyrica 75 BID - TID  Cont Baclofen 10 qhs, unable to tolerate during the day  D/c Robaxin 500 BID PRN  Cont to follow up with Psychology   Cont Cymbalta 60mg   Will consider accupuncture if necessary  Will consider NSAID if necessary   2. Gait abnormality  Mildly antalgic, cont slow transitional movements  3. Sleep disturbance with OSA  Cont meds  See #1  4. Morbid Obesity  Cont weight loss, patient states she will start after improvement in social factors  5. Myalgia   Will consider trigger point injections when able to tolerate  See #1  6. Migraines  Cont Topamax  See #1.    7. Connective tissue disorder with inflammatory arthritis  Being worked up by Rheum  See #1  Collow up with Rheum  Cont Plaquinil   Cont Methotrexate per Rheum

## 2019-07-18 ENCOUNTER — Other Ambulatory Visit: Payer: Self-pay | Admitting: Physical Medicine & Rehabilitation

## 2019-09-21 ENCOUNTER — Other Ambulatory Visit: Payer: Self-pay | Admitting: Hematology

## 2019-11-16 ENCOUNTER — Other Ambulatory Visit: Payer: Self-pay | Admitting: Physical Medicine & Rehabilitation

## 2019-11-20 ENCOUNTER — Other Ambulatory Visit: Payer: Self-pay | Admitting: Family Medicine

## 2019-11-20 DIAGNOSIS — E559 Vitamin D deficiency, unspecified: Secondary | ICD-10-CM

## 2019-12-19 ENCOUNTER — Other Ambulatory Visit: Payer: Self-pay

## 2019-12-19 ENCOUNTER — Encounter: Payer: Self-pay | Admitting: Allergy

## 2019-12-19 ENCOUNTER — Ambulatory Visit (INDEPENDENT_AMBULATORY_CARE_PROVIDER_SITE_OTHER): Payer: 59 | Admitting: Allergy

## 2019-12-19 DIAGNOSIS — J4530 Mild persistent asthma, uncomplicated: Secondary | ICD-10-CM

## 2019-12-19 DIAGNOSIS — J3089 Other allergic rhinitis: Secondary | ICD-10-CM

## 2019-12-19 MED ORDER — FLUTICASONE PROPIONATE 50 MCG/ACT NA SUSP: NASAL | 5 refills | Status: DC

## 2019-12-19 MED ORDER — IPRATROPIUM BROMIDE 0.03 % NA SOLN: 2.0000 | Freq: Three times a day (TID) | NASAL | 5 refills | Status: DC | PRN

## 2019-12-19 MED ORDER — FLUTICASONE-SALMETEROL 250-50 MCG/DOSE IN AEPB: INHALATION_SPRAY | RESPIRATORY_TRACT | 5 refills | Status: DC

## 2019-12-19 MED ORDER — ALBUTEROL SULFATE HFA 108 (90 BASE) MCG/ACT IN AERS: 2.0000 | INHALATION_SPRAY | RESPIRATORY_TRACT | 1 refills | Status: DC | PRN

## 2019-12-19 MED ORDER — MONTELUKAST SODIUM 10 MG PO TABS: 10.0000 mg | ORAL_TABLET | Freq: Every day | ORAL | 5 refills | Status: DC

## 2019-12-19 MED ORDER — CETIRIZINE HCL 10 MG PO TABS: 10.0000 mg | ORAL_TABLET | Freq: Every morning | ORAL | 5 refills | Status: DC

## 2019-12-19 NOTE — Patient Instructions (Addendum)
Allergic rhinitis with conjunctivitis and migraines     - continue avoidance measures for tree pollen, molds, dust mites and cockroach.     - continue use of Cetirizine 10mg  daily    - continue Singulair 10mg  daily at night    - continue Fluticasone 1-2 sprays each nostril daily for nasal congestion    - for nasal drainage/throat clearing use nasal Ipratropium 2 sprays each nostril up to 3 times a day as needed.       - use nasal saline spray to help keep nose clean and moisturized    - consider allergen immunotherapy (allergy shots) if medication management becomes ineffective in controlling allergy symptoms.     - for nasal sores use Mupirocin ointment apply to the nose with q-tip twice a day for 10 days at a time.    Asthma, adult-onset    - under good control at this time    - continue Advair diskus 250-17mcg  1 puff twice a day.      - have access to Ventolin inhaler 2 puffs every 4-6 hours as needed for cough/wheeze/shortness of breath/chest tightness.  May use 15-20 minutes prior to activity.   Monitor frequency of use.    Asthma control goals:   Full participation in all desired activities (may need albuterol before activity)  Albuterol use two time or less a week on average (not counting use with activity)  Cough interfering with sleep two time or less a month  Oral steroids no more than once a year  No hospitalizations   Follow-up 6-9 months or sooner if needed

## 2019-12-19 NOTE — Progress Notes (Signed)
Follow-up Note  RE: Holly Mcdonald MRN: [id number] DOB: 1975-05-19 Date of Office Visit: 12/19/2019   History of present illness: Holly Mcdonald is a 44 y.o. female presenting today for follow-up of allergic rhinitis with conjunctivitis and migraines, asthma.  She was last seen in the office on 06/21/19 by myself.  She states she has been doing well without any major health changes.  She did received [employer] vaccine with second dose in April.  She was discontinued on her Plaquenil and was started on IV infusions of Simponi.  She continues on MTX. She states the combination of these is helping more with her connective tissue disease with athritis.  She states she has less pain days with this change in medication.   In regards to her allergies she states the zyrtec and singulair continue to control her symptoms.  She will use flonase as well for congestion.  However she does report more nasal drainage/throat clearing.  Occasional itchy eyes but not enough to warrant eyedrop use.  She has no report of migraines.  She states her asthma is under good control with continued use of Advair 250/72mcg 1 puff twice a day.  She has not needed to use albuterol.  She states however with her history of PE that she does have shortness of breath at baseline which has been stable.   Review of systems: Review of Systems  Constitutional: Negative.   HENT:       See HPI  Eyes: Negative.   Respiratory: Positive for shortness of breath.   Cardiovascular: Negative.   Gastrointestinal: Negative.   Musculoskeletal: Positive for joint pain.  Skin: Negative.   Neurological: Negative.     All other systems negative unless noted above in HPI  Past medical/social/surgical/family history have been reviewed and are unchanged unless specifically indicated below.  No changes  Medication List: Current Outpatient Medications  Medication Sig Dispense Refill  . albuterol (VENTOLIN HFA) 108 (90  Base) MCG/ACT inhaler Inhale 2 puffs into the lungs every 4 (four) hours as needed. 18 g 1  . baclofen (LIORESAL) 10 MG tablet TAKE 1 TABLET BY MOUTH THREE TIMES DAILY AS NEEDED FOR MUSCLE SPASMS (Patient taking differently: Take 5 mg by mouth 3 (three) times daily as needed for muscle spasms. ) 90 tablet 5  . benzonatate (TESSALON) 100 MG capsule Take 200 mg by mouth 3 (three) times daily as needed for cough.    . cetirizine (ZYRTEC) 10 MG tablet Take 1 tablet (10 mg total) by mouth every morning. 30 tablet 5  . Cyanocobalamin (VITAMIN B 12 PO) Take 1,000 mg by mouth daily.     . DULoxetine (CYMBALTA) 60 MG capsule TAKE ONE CAPSULE BY MOUTH DAILY 30 capsule 2  . ELIQUIS 5 MG TABS tablet TAKE ONE TABLET BY MOUTH TWICE A DAY 60 tablet 2  . fluticasone (FLONASE) 50 MCG/ACT nasal spray SHAKE LIQUID AND USE 1 SPRAY IN EACH NOSTRIL DAILY--  in am 16 g 5  . Fluticasone-Salmeterol (ADVAIR DISKUS) 250-50 MCG/DOSE AEPB INHALE 1 PUFF INTO THE LUNGS TWICE DAILY AS NEEDED 60 each 5  . folic acid (FOLVITE) 1 MG tablet Take 2 mg by mouth daily.     . Golimumab ([place] ARIA IV) Inject into the vein.    . methocarbamol (ROBAXIN) 500 MG tablet Take 1 tablet (500 mg total) by mouth 2 (two) times daily as needed for muscle spasms. 60 tablet 1  . methotrexate 2.5 MG tablet Take 15 mg by  mouth once a week. Friday in the evening    . montelukast (SINGULAIR) 10 MG tablet Take 1 tablet (10 mg total) by mouth at bedtime. 30 tablet 5  . omeprazole (PRILOSEC) 40 MG capsule TAKE 1 CAPSULE(40 MG) BY MOUTH DAILY 90 capsule 2  . oxybutynin (DITROPAN) 5 MG tablet Take 5 mg by mouth 2 (two) times daily.   11  . pregabalin (LYRICA) 75 MG capsule Take 1 capsule (75 mg total) by mouth 2 (two) times daily. Additional 75 mg if needed 60 capsule 5  . timolol (TIMOPTIC) 0.5 % ophthalmic solution Place 1 drop into both eyes daily.     Marland Kitchen topiramate (TOPAMAX) 25 MG tablet Take 75 mg by mouth at bedtime.   1  . Vitamin D, Ergocalciferol,  (DRISDOL) 1.25 MG (50000 UNIT) CAPS capsule TAKE 1 CAPSULE BY MOUTH EVERY 10 DAYS 3 capsule 4  . hydroxychloroquine (PLAQUENIL) 200 MG tablet Take 400 mg by mouth at bedtime.  (Patient not taking: Reported on 12/19/2019)    . ipratropium (ATROVENT) 0.03 % nasal spray Place 2 sprays into both nostrils 3 (three) times daily as needed (nasal drainage, throat clearing). 30 mL 5  . mupirocin nasal ointment (BACTROBAN) 2 % Place 1 application into the nose 2 (two) times daily for 10 days. 20 g 0   No current facility-administered medications for this visit.     Known medication allergies: Allergies  Allergen Reactions  . Darvon [Propoxyphene] Nausea And Vomiting     Physical examination: Blood pressure 110/88, pulse 62, temperature 98 F (36.7 C), resp. rate 16, height 5\' 10"  (1.778 m), weight (!) 343 lb 6.4 oz (155.8 kg), last menstrual period 03/05/2019, SpO2 90 %.  General: Alert, interactive, in no acute distress. HEENT: PERRLA, TMs pearly gray, turbinates minimally edematous without discharge, post-pharynx non erythematous. Neck: Supple without lymphadenopathy. Lungs: Clear to auscultation without wheezing, rhonchi or rales. {no increased work of breathing. CV: Normal S1, S2 without murmurs. Abdomen: Nondistended, nontender. Skin: Warm and dry, without lesions or rashes. Extremities:  No clubbing, cyanosis or edema. Neuro:   Grossly intact.  Diagnositics/Labs: None today  Assessment and plan:   Allergic rhinitis with conjunctivitis and migraines     - continue avoidance measures for tree pollen, molds, dust mites and cockroach.     - continue use of Cetirizine 10mg  daily    - continue Singulair 10mg  daily at night    - continue Fluticasone 1-2 sprays each nostril daily for nasal congestion    - for nasal drainage/throat clearing use nasal Ipratropium 2 sprays each nostril up to 3 times a day as needed.       - use nasal saline spray to help keep nose clean and moisturized    -  consider allergen immunotherapy (allergy shots) if medication management becomes ineffective in controlling allergy symptoms.     - for nasal sores use Mupirocin ointment apply to the nose with q-tip twice a day for 10 days at a time.    Asthma, adult-onset    - under good control at this time    - continue Advair diskus 250-11mcg  1 puff twice a day.      - have access to Ventolin inhaler 2 puffs every 4-6 hours as needed for cough/wheeze/shortness of breath/chest tightness.  May use 15-20 minutes prior to activity.   Monitor frequency of use.    Asthma control goals:   Full participation in all desired activities (may need albuterol before activity)  Albuterol  use two time or less a week on average (not counting use with activity)  Cough interfering with sleep two time or less a month  Oral steroids no more than once a year  No hospitalizations   Follow-up 6-9 months or sooner if needed  I appreciate the opportunity to take part in Holly's care. Please do not hesitate to contact me with questions.  Sincerely,   Prudy Feeler, MD Allergy/Immunology [facility]

## 2019-12-20 ENCOUNTER — Telehealth: Payer: Self-pay

## 2019-12-20 NOTE — Telephone Encounter (Signed)
Has the Advair been denied? If so then the most comparable would be Breo 200 mcg 1 puff once a day.

## 2019-12-20 NOTE — Telephone Encounter (Signed)
[place] Head or Symbicort is showing as possibly covered by patient's insurance. Can you please advise on an alternative.

## 2019-12-20 NOTE — Telephone Encounter (Signed)
Prior authorization request received from pharmacy for Advair Diskus 250-52mcg. This has been submitted via covermymeds. I am sending a mychart message to the patient to let her know that this has been done.

## 2019-12-20 NOTE — Telephone Encounter (Signed)
I gave verbal confirmation that Advair had been denied. I will send in [facility] tomorrow and notify patient.

## 2019-12-21 MED ORDER — FLUTICASONE FUROATE-VILANTEROL 200-25 MCG/INH IN AEPB: 1.0000 | INHALATION_SPRAY | Freq: Every day | RESPIRATORY_TRACT | 5 refills | Status: DC

## 2019-12-21 NOTE — Telephone Encounter (Signed)
Prescription has been sent in for Breo 200 1 puff once daily. I left a message advising of alternative medication sent in and to call back with further questions.

## 2019-12-21 NOTE — Addendum Note (Signed)
Addended by: Lucrezia Starch I on: 12/21/2019 04:07 PM   Modules accepted: Orders

## 2019-12-25 ENCOUNTER — Other Ambulatory Visit: Payer: Self-pay

## 2019-12-25 ENCOUNTER — Encounter: Payer: 59 | Attending: Physical Medicine & Rehabilitation | Admitting: Physical Medicine & Rehabilitation

## 2019-12-25 ENCOUNTER — Encounter: Payer: Self-pay | Admitting: Physical Medicine & Rehabilitation

## 2019-12-25 VITALS — BP 110/88 | HR 62 | Resp 16 | Ht 70.0 in | Wt 343.4 lb

## 2019-12-25 DIAGNOSIS — M359 Systemic involvement of connective tissue, unspecified: Secondary | ICD-10-CM | POA: Diagnosis not present

## 2019-12-25 DIAGNOSIS — R269 Unspecified abnormalities of gait and mobility: Secondary | ICD-10-CM | POA: Insufficient documentation

## 2019-12-25 DIAGNOSIS — G479 Sleep disorder, unspecified: Secondary | ICD-10-CM

## 2019-12-25 DIAGNOSIS — M1990 Unspecified osteoarthritis, unspecified site: Secondary | ICD-10-CM

## 2019-12-25 DIAGNOSIS — M797 Fibromyalgia: Secondary | ICD-10-CM | POA: Diagnosis not present

## 2019-12-25 DIAGNOSIS — G894 Chronic pain syndrome: Secondary | ICD-10-CM

## 2019-12-25 NOTE — Progress Notes (Signed)
Subjective:    Patient ID: Holly Mcdonald, female    DOB: 10/27/1975, 44 y.o.   MRN: [id number]  TELEHEALTH NOTE  Due to national recommendations of social distancing due to COVID 19, an audio/video telehealth visit is felt to be most appropriate for this patient at this time.  See Chart message from today for the patient's consent to telehealth from [place].     I verified that I am speaking with the correct person using two identifiers.  Location of patient: Home Location of provider: Office Method of communication: MyChart video visit Names of participants : Zorita Pang scheduling, Wenda Overland obtaining consent and vitals if available Established patient  HPI Female with past medical history of tremors, pulmonary embolism, morbid obesity, migraines, fibromyalgia, prediabetes, asthma, fibroids, chronic bronchitis presents with fibromyalgia.  Initially stated: Started 10/2016 after diagnosed PE.  Progressively getting worse.  Heat improved the pain.  Fibroids have exacerbated the pain, with surgery planned for 10/17/17.  Activity exacerbates the pain.  All qualities of pain.  Non-radiating.  Constant.  Associated muscle spasms and tingling.  Tylenol does not help.  Cymbalta provides some benefit. Denies falls. Pain limits all activities.   Works as Therapist, art.    Last clinic visit 06/26/2019.  Since that time, Last clinic visit 06/26/2019.  Since that time, patient states she has had a lot going on with her, not specifically related to health. Discussed booster shot for Covid. She states pain has been under control. She is using weighted blanket with benefit.  She I not in the pool.  She has not needed TENs unit recently.  She is using using her Lidoderm patch.  She has benefit with Lyrica.  Denies falls.  Sleep is good.  Pain Inventory Average Pain 3 Pain Right Now 3 My pain is intermittent, constant and aching  In the last 24  hours, has pain interfered with the following? General activity 2 Relation with others 0 Enjoyment of life 1 What TIME of day is your pain at its worst? all Sleep (in general) Good  Pain is worse with: walking, bending, sitting, inactivity, standing and some activites Pain improves with: rest, pacing activities and medication Relief from Meds: 4  Mobility walk without assistance how many minutes can you walk? 5-10 ability to climb steps?  yes do you drive?  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  no  Physicians involved in your care Any changes since last visit?  no Dr. Maren Beach   Family History  Problem Relation Age of Onset  . Pulmonary embolism Mother        Last year  . Mental retardation Mother        PTSD and fibromyalgia  . Heart disease Mother        CHF  . Congestive Heart Failure Mother   . Heart disease Father 39       CAD  . Hyperlipidemia Father   . Diabetes Paternal Grandmother   . Heart attack Paternal Grandmother   . Congestive Heart Failure Maternal Aunt   . Cancer Neg Hx    Social History   Socioeconomic History  . Marital status: Divorced    Spouse name: Not on file  . Number of children: Not on file  . Years of education:  Not on file  . Highest education level: Not on file  Occupational History  . Occupation: representative    Comment: [employer]  Tobacco Use  . Smoking status: Never Smoker  . Smokeless tobacco: Never Used  Vaping Use  . Vaping Use: Never used  Substance and Sexual Activity  . Alcohol use: Yes    Comment: occasionally  . Drug use: No  . Sexual activity: Yes    Birth control/protection: I.U.D.    Comment: 1st intercourse 44 yo-More than 5 partners Mirena 09/16/2017  Other Topics Concern  . Not on file  Social History  Narrative  . Not on file   Social Determinants of Health   Financial Resource Strain:   . Difficulty of Paying Living Expenses:   Food Insecurity:   . Worried About Charity fundraiser in the Last Year:   . Arboriculturist in the Last Year:   Transportation Needs:   . Film/video editor (Medical):   Marland Kitchen Lack of Transportation (Non-Medical):   Physical Activity:   . Days of Exercise per Week:   . Minutes of Exercise per Session:   Stress:   . Feeling of Stress :   Social Connections:   . Frequency of Communication with Friends and Family:   . Frequency of Social Gatherings with Friends and Family:   . Attends Religious Services:   . Active Member of Clubs or Organizations:   . Attends Archivist Meetings:   Marland Kitchen Marital Status:    Past Surgical History:  Procedure Laterality Date  . ANAL EXAMINATION UNDER ANESTHESIA  age 76   w/ removal cyst  . CYSTOSCOPY N/A 04/09/2019   Procedure: CYSTOSCOPY;  Surgeon: [name], MD;  Location: [facility];  Service: Gynecology;  Laterality: N/A;  . DILATATION & CURETTAGE/HYSTEROSCOPY WITH MYOSURE N/A 10/17/2017   Procedure: DILATATION & CURETTAGE/HYSTEROSCOPY WITH MYOSURE;  Surgeon: Anastasio Auerbach, MD;  Location: [facility];  Service: Gynecology;  Laterality: N/A;  . DILATATION & CURETTAGE/HYSTEROSCOPY WITH MYOSURE N/A 04/11/2018   Procedure: DILATATION & CURETTAGE/HYSTEROSCOPY WITH MYOSURE;  Surgeon: Anastasio Auerbach, MD;  Location: [facility];  Service: Gynecology;  Laterality: N/A;  . GYNECOLOGIC CRYOSURGERY  age 39s  . TONSILLECTOMY  age 40  . TOTAL LAPAROSCOPIC HYSTERECTOMY WITH SALPINGECTOMY Bilateral 04/09/2019   Procedure: TOTAL LAPAROSCOPIC HYSTERECTOMY WITH SALPINGECTOMY;  Surgeon: [name], MD;  Location: [facility];  Service: Gynecology;  Laterality: Bilateral;  Has Mirena IUD in place, 3 hours surgery time. Extended recovery bed.  . TRANSTHORACIC ECHOCARDIOGRAM  01/17/2017   ef  60-65%/  mild TR   Past Medical History:  Diagnosis Date  . Anticoagulated    eliquis  . Arthritis    inflammatory  . 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 Bladder spasm   . Fibromyalgia   . GERD (gastroesophageal reflux disease)   . History of chronic bronchitis    last flare up 11-18-17  . History of pulmonary embolism 10/2016--- followed by pcp and pulmoloigst   dx bilateral PE , bilateral lower lobes , segmental , unprovoked, negative hypercoagulable work-up-- treated w/ eliquis  . Iron deficiency anemia   . Migraine   . Mild intermittent asthma    pulmologist-  dr Elsworth Soho  . Mixed connective tissue disease ([place])   . Morbid obesity with BMI of 45.0-49.9, adult ([place])   . Open-angle glaucoma of both eyes   . Pre-diabetes   . 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  bilateral ---- stopped eliquis  . Sleep apnea   . Uterine fibroid   . Vitamin D deficiency    BP 110/88 Comment: last recorded  Pulse 62 Comment: last recorded  Resp 16 Comment: last recorded  Ht 5\' 10"  (1.778 m) Comment: reported  Wt (!) 343 lb 6.4 oz (155.8 kg) Comment: last recorded  LMP 03/05/2019   BMI 49.27 kg/m   Opioid Risk Score:   Fall Risk Score:  `1  Depression screen PHQ 2/9  Depression screen [facility] 2/9 05/17/2018 01/12/2018 10/07/2017  Decreased Interest 0 0 0  Down, Depressed, Hopeless 0 0 0  PHQ - 2 Score 0 0 0  Altered sleeping - - 1  Tired, decreased energy - - 3  Change in appetite - - 3  Feeling bad or failure about yourself  - - 0  Trouble concentrating - - 0  Moving slowly or fidgety/restless - - 0  Suicidal thoughts - - 0  PHQ-9 Score - - 7  Difficult doing work/chores - - Somewhat difficult  Some recent data might be hidden   Review of Systems  Constitutional: Negative.   HENT: Negative.   Eyes: Negative.   Respiratory: Positive for apnea.   Cardiovascular: Negative.   Gastrointestinal: Negative.   Endocrine: Negative.   Genitourinary: Positive for difficulty urinating.        Bladder control  Musculoskeletal: Positive for arthralgias, back pain, myalgias and neck pain.  Skin: Negative.   Allergic/Immunologic: Negative.   Neurological: Positive for tremors.       Tingling  Hematological: Negative.   Psychiatric/Behavioral: The patient is nervous/anxious.   All other systems reviewed and are negative.     Objective:   Physical Exam Constitutional: NAD.  Respiratory: Normal effort.   Psych: Normal mood.  Normal behavior. Neuro: Alert     Assessment & Plan:  Female with past medical history of tremors, pulmonary embolism, morbid obesity, migraines, fibromyalgia, prediabetes, asthma, fibroids, chronic bronchitis presents with pain all over.   1. Chronic diffuse pain  Multifactorial - Fibromyalgia, connective tissue disorder, inflammatory arthritis, fibroids s/p hysterectomy  Side effects with Gabapentin  Neck xray reviewed, showing mild disc space narrowing at C7-T1.  Cont Heat  Encouraged pool therapy (completed aquatic therapy), reminded again after CoVid.  Cont TENS IT with benefit, reminded  Continue Lidoderm OTC  Continue Lyrica 75 BID - TID, usually taking twice daily  Cont Baclofen 10 qhs, unable to tolerate during the day  D/ced Robaxin 500 BID PRN  Cont to follow up with Psychology   Cont Cymbalta 60mg   Will consider accupuncture if necessary  Will consider NSAID if necessary   2. Gait abnormality  Mildly antalgic, cont slow transitional movements  3. Sleep disturbance with OSA  Improved with meds  See #1  4. Morbid Obesity  Cont weight loss, patient states she will start after improvement in social factors  5. Myalgia   Will consider trigger point injections when able to tolerate  See #1  6. Migraines  Cont Topamax  See #1.    7. Connective tissue disorder with inflammatory arthritis  Being worked up by Rheum  See #1  Collow up with Rheum  Plaquenil DC'd, infusion started  Cont Methotrexate per Rheum  Encourage  follow-up discussion regarding booster vaccine

## 2020-02-14 ENCOUNTER — Other Ambulatory Visit: Payer: Self-pay | Admitting: Physical Medicine & Rehabilitation

## 2020-03-18 ENCOUNTER — Encounter: Payer: Self-pay | Admitting: Obstetrics & Gynecology

## 2020-05-01 ENCOUNTER — Other Ambulatory Visit: Payer: Self-pay | Admitting: Physical Medicine & Rehabilitation

## 2020-05-01 ENCOUNTER — Ambulatory Visit: Payer: 59 | Admitting: Obstetrics & Gynecology

## 2020-05-06 ENCOUNTER — Telehealth: Payer: Self-pay | Admitting: *Deleted

## 2020-05-06 NOTE — Telephone Encounter (Signed)
Holly Mcdonald called and said her pharmacy notified her that her pregabalin had been denied.  She is asking that if be refilled, and she will make appt.  Please advise.

## 2020-05-07 MED ORDER — PREGABALIN 75 MG PO CAPS: 75.0000 mg | ORAL_CAPSULE | Freq: Two times a day (BID) | ORAL | 0 refills | Status: DC

## 2020-05-07 NOTE — Telephone Encounter (Signed)
I refilled one time and let her know. She is making appt now.

## 2020-05-07 NOTE — Telephone Encounter (Signed)
We can refill it one time, but she needs to make an appointment.  Thanks.

## 2020-05-14 ENCOUNTER — Other Ambulatory Visit: Payer: Self-pay | Admitting: Physical Medicine & Rehabilitation

## 2020-06-03 ENCOUNTER — Other Ambulatory Visit: Payer: Self-pay | Admitting: Physical Medicine & Rehabilitation

## 2020-06-09 ENCOUNTER — Other Ambulatory Visit: Payer: Self-pay

## 2020-06-09 ENCOUNTER — Encounter: Payer: Self-pay | Admitting: Physical Medicine & Rehabilitation

## 2020-06-09 ENCOUNTER — Encounter: Payer: 59 | Attending: Physical Medicine & Rehabilitation | Admitting: Physical Medicine & Rehabilitation

## 2020-06-09 VITALS — BP 110/88 | HR 95 | Ht 70.0 in | Wt 343.0 lb

## 2020-06-09 DIAGNOSIS — M797 Fibromyalgia: Secondary | ICD-10-CM

## 2020-06-09 DIAGNOSIS — M359 Systemic involvement of connective tissue, unspecified: Secondary | ICD-10-CM

## 2020-06-09 DIAGNOSIS — G894 Chronic pain syndrome: Secondary | ICD-10-CM | POA: Diagnosis not present

## 2020-06-09 NOTE — Progress Notes (Signed)
Subjective:    Patient ID: Holly Mcdonald, female    DOB: 11-03-1975, 45 y.o.   MRN: [id number]  TELEHEALTH NOTE  Due to national recommendations of social distancing due to COVID 19, an audio/video telehealth visit is felt to be most appropriate for this patient at this time.  See Chart message from today for the patient's consent to telehealth from [place].     I verified that I am speaking with the correct person using two identifiers.  Location of patient: Home Location of provider: Office Method of communication: MyChart video visit Names of participants : Zorita Pang scheduling, Wenda Overland obtaining consent and vitals if available Established patient  HPI Female with past medical history of tremors, pulmonary embolism, morbid obesity, migraines, fibromyalgia, prediabetes, asthma, fibroids, chronic bronchitis presents with fibromyalgia.  Initially stated: Started 10/2016 after diagnosed PE.  Progressively getting worse.  Heat improved the pain.  Fibroids have exacerbated the pain, with surgery planned for 10/17/17.  Activity exacerbates the pain.  All qualities of pain.  Non-radiating.  Constant.  Associated muscle spasms and tingling.  Tylenol does not help.  Cymbalta provides some benefit. Denies falls. Pain limits all activities.   Works as Therapist, art.    Last clinic visit 12/24/2020. Patient states she is on the tail end of a flare up after dropping 1L bottle on her toe. She forgets that she has a TENS.  She is laying down under her electric blanket. She is using Lidocaine patch, Baclofen rarely.  She is taking Lyrica BID, TID during flares.  Denies falls. She states she is gaining weight.  She follows up with headache specialist.  She is seeing Rheum and receives infusion.   Pain Inventory Average Pain 5 Pain Right Now 3 My pain is constant and aching  In the last 24 hours, has pain interfered with the  following? General activity 6 Relation with others 0 Enjoyment of life 0 What TIME of day is your pain at its worst? [place], daytime, evening & night Sleep (in general) Good  Pain is worse with: walking, bending, sitting, inactivity, standing and some activites Pain improves with: rest, medication and CBD Gummies & Heat Relief from Meds: 4    Family History  Problem Relation Age of Onset  . Pulmonary embolism Mother        Last year  . Mental retardation Mother        PTSD and fibromyalgia  . Heart disease Mother        CHF  . Congestive Heart Failure Mother   . Heart disease Father 88       CAD  . Hyperlipidemia Father   . Diabetes Paternal Grandmother   . Heart attack Paternal Grandmother   . Congestive Heart Failure Maternal Aunt   . Cancer Neg Hx    Social History   Socioeconomic History  . Marital status: Divorced    Spouse name: Not on file  . Number of children: Not on file  . Years of education: Not on file  . Highest education level: Not on file  Occupational History  . Occupation: representative    Comment: [employer]  Tobacco Use  . Smoking status: Never Smoker  . Smokeless tobacco: Never Used  Vaping Use  . Vaping Use: Never used  Substance and Sexual Activity  . Alcohol use: Yes    Comment: occasionally  . Drug use: No  . Sexual activity: Yes    Birth control/protection: I.U.D.  Comment: 1st intercourse 45 yo-More than 5 partners Mirena 09/16/2017  Other Topics Concern  . Not on file  Social History Narrative  . Not on file   Social Determinants of Health   Financial Resource Strain: Not on file  Food Insecurity: Not on file  Transportation Needs: Not on file  Physical Activity: Not on file  Stress: Not on file  Social Connections: Not on file   Past Surgical History:  Procedure Laterality Date  . ANAL EXAMINATION UNDER ANESTHESIA  age 66   w/ removal cyst  . CYSTOSCOPY N/A 04/09/2019   Procedure: CYSTOSCOPY;  Surgeon: [name], MD;   Location: [facility];  Service: Gynecology;  Laterality: N/A;  . DILATATION & CURETTAGE/HYSTEROSCOPY WITH MYOSURE N/A 10/17/2017   Procedure: DILATATION & CURETTAGE/HYSTEROSCOPY WITH MYOSURE;  Surgeon: Anastasio Auerbach, MD;  Location: [facility];  Service: Gynecology;  Laterality: N/A;  . DILATATION & CURETTAGE/HYSTEROSCOPY WITH MYOSURE N/A 04/11/2018   Procedure: DILATATION & CURETTAGE/HYSTEROSCOPY WITH MYOSURE;  Surgeon: Anastasio Auerbach, MD;  Location: [facility];  Service: Gynecology;  Laterality: N/A;  . GYNECOLOGIC CRYOSURGERY  age 56s  . TONSILLECTOMY  age 87  . TOTAL LAPAROSCOPIC HYSTERECTOMY WITH SALPINGECTOMY Bilateral 04/09/2019   Procedure: TOTAL LAPAROSCOPIC HYSTERECTOMY WITH SALPINGECTOMY;  Surgeon: [name], MD;  Location: [facility];  Service: Gynecology;  Laterality: Bilateral;  Has Mirena IUD in place, 3 hours surgery time. Extended recovery bed.  . TRANSTHORACIC ECHOCARDIOGRAM  01/17/2017   ef 60-65%/  mild TR   Past Medical History:  Diagnosis Date  . Anticoagulated    eliquis  . Arthritis    inflammatory  . 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 Bladder spasm   . Fibromyalgia   . GERD (gastroesophageal reflux disease)   . History of chronic bronchitis    last flare up 11-18-17  . History of pulmonary embolism 10/2016--- followed by pcp and pulmoloigst   dx bilateral PE , bilateral lower lobes , segmental , unprovoked, negative hypercoagulable work-up-- treated w/ eliquis  . Iron deficiency anemia   . Migraine   . Mild intermittent asthma    pulmologist-  dr Elsworth Soho  . Mixed connective tissue disease ([place])   . Morbid obesity with BMI of 45.0-49.9, adult ([place])   . Open-angle glaucoma of both eyes   . Pre-diabetes   . 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 Sleep apnea   . Uterine fibroid   . Vitamin D deficiency    BP 110/88 Comment: last recorded on 12/19/2019 - video visit  Pulse 95   Ht 5'  10" (1.778 m)   Wt (!) 343 lb (155.6 kg) Comment: video visit  LMP 03/05/2019   SpO2 96%   BMI 49.22 kg/m   Opioid Risk Score:   Fall Risk Score:  `1  Depression screen PHQ 2/9  Depression screen [facility] 2/9 06/09/2020 12/25/2019 05/17/2018 01/12/2018 10/07/2017  Decreased Interest 0 0 0 0 0  Down, Depressed, Hopeless 0 0 0 0 0  PHQ - 2 Score 0 0 0 0 0  Altered sleeping - - - - 1  Tired, decreased energy - - - - 3  Change in appetite - - - - 3  Feeling bad or failure about yourself  - - - - 0  Trouble concentrating - - - - 0  Moving slowly or fidgety/restless - - - - 0  Suicidal thoughts - - - - 0  PHQ-9 Score - - - - 7  Difficult doing work/chores - - - - Somewhat difficult  Some recent data might be hidden   Review of Systems  Constitutional: Negative.   HENT: Negative.   Eyes: Negative.   Respiratory: Positive for apnea.   Cardiovascular: Negative.   Gastrointestinal: Negative.   Endocrine: Negative.   Genitourinary: Positive for difficulty urinating.       Bladder control  Musculoskeletal: Positive for arthralgias, back pain, myalgias and neck pain.  Skin: Negative.   Allergic/Immunologic: Negative.   Neurological: Positive for tremors.       Tingling  Hematological: Negative.   Psychiatric/Behavioral: The patient is nervous/anxious.   All other systems reviewed and are negative.     Objective:   Physical Exam Constitutional: NAD.  Respiratory: Normal effort. Psych: Normal mood.  Normal behavior. Neuro: Alert    Assessment & Plan:  Female with past medical history of tremors, pulmonary embolism, morbid obesity, migraines, fibromyalgia, prediabetes, asthma, fibroids, chronic bronchitis presents with pain all over.   1. Chronic diffuse pain             Multifactorial - Fibromyalgia, connective tissue disorder, inflammatory arthritis, fibroids s/p hysterectomy             Side effects with Gabapentin             Neck xray reviewed, showing mild disc space narrowing  at C7-T1.             Continue Heat             Encouraged pool therapy (completed aquatic therapy), reminded again after CoVid.             Cont TENS IT with benefit, reminded             Continue Lidoderm OTC             Continue Lyrica 75 BID - TID, usually taking twice daily             Continue Baclofen 10 qhs, unable to tolerate during the day             D/ced Robaxin 500 BID PRN             Cont to follow up with Psychology   Continue 60mg              Will consider accupuncture if necessary             Will consider NSAID if necessary              2. Gait abnormality             Continue slow transitional movements  3. Sleep disturbance with OSA             Improved with meds             See #1  4. Morbid Obesity             Encouraged weight loss again  5. Myalgia                     Will consider trigger point injections when able to tolerate             See #1  Not needed at present  6. Migraines             Cont Topamax             See #1.  7. Connective tissue disorder with inflammatory arthritis             Continue to be worked up by Rheum             See #1             Plaquenil DC'd, infusion started             Continue Methotrexate per Rheum

## 2020-07-03 ENCOUNTER — Other Ambulatory Visit: Payer: Self-pay | Admitting: Physical Medicine & Rehabilitation

## 2020-07-03 ENCOUNTER — Other Ambulatory Visit: Payer: Self-pay | Admitting: Allergy

## 2020-07-03 DIAGNOSIS — J3089 Other allergic rhinitis: Secondary | ICD-10-CM

## 2020-07-17 ENCOUNTER — Ambulatory Visit: Payer: 59 | Admitting: Allergy

## 2020-07-25 ENCOUNTER — Ambulatory Visit: Payer: 59 | Admitting: Allergy

## 2020-07-25 ENCOUNTER — Encounter: Payer: Self-pay | Admitting: Allergy

## 2020-07-25 ENCOUNTER — Ambulatory Visit (INDEPENDENT_AMBULATORY_CARE_PROVIDER_SITE_OTHER): Payer: 59 | Admitting: Allergy

## 2020-07-25 ENCOUNTER — Other Ambulatory Visit: Payer: Self-pay

## 2020-07-25 VITALS — BP 122/88 | HR 95 | Temp 97.9°F | Resp 14 | Ht 70.0 in | Wt 360.0 lb

## 2020-07-25 DIAGNOSIS — H1013 Acute atopic conjunctivitis, bilateral: Secondary | ICD-10-CM

## 2020-07-25 DIAGNOSIS — J4530 Mild persistent asthma, uncomplicated: Secondary | ICD-10-CM | POA: Diagnosis not present

## 2020-07-25 DIAGNOSIS — J3089 Other allergic rhinitis: Secondary | ICD-10-CM

## 2020-07-25 DIAGNOSIS — G43009 Migraine without aura, not intractable, without status migrainosus: Secondary | ICD-10-CM

## 2020-07-25 NOTE — Progress Notes (Signed)
Follow-up Note  RE: Holly Mcdonald MRN: [id number] DOB: 1975/11/24 Date of Office Visit: 07/25/2020   History of present illness: Holly Mcdonald is a 45 y.o. female presenting today for follow-up of allergic rhinitis with conjunctivitis, migraines, asthma.  She was last seen in the office on 12/19/2019 by myself.  She states she has been doing well since her last visit has not had any illnesses.  She denies any hospitalizations or surgeries.  She states that her allergies are controlled with use of Zyrtec and Singulair and she will use Flonase and nasal ipratropium for congestion and drainage respectively.  She states they still help when she does use them.  She also states that the mupirocin helps with the nasal sore she does have when she remembers to use it. Her insurance denied Advair and does cover Breo thus she has been switched to Breo 1 puff once a day.  She states this has been controlling her asthma just as well as the Advair did.  She has not needed to use any albuterol.  She denies any daytime or nighttime symptoms and has not required any ED or urgent care visits for asthma or any systemic steroid needs.     Review of systems: Review of Systems  Constitutional: Negative.   HENT: Negative.   Eyes: Negative.   Respiratory: Negative.   Cardiovascular: Negative.   Gastrointestinal: Negative.   Musculoskeletal: Negative.   Skin: Negative.   Neurological: Negative.     All other systems negative unless noted above in HPI  Past medical/social/surgical/family history have been reviewed and are unchanged unless specifically indicated below.  No changes  Medication List: Current Outpatient Medications  Medication Sig Dispense Refill  . albuterol (VENTOLIN HFA) 108 (90 Base) MCG/ACT inhaler Inhale 2 puffs into the lungs every 4 (four) hours as needed. 18 g 1  . baclofen (LIORESAL) 10 MG tablet TAKE 1 TABLET BY MOUTH THREE TIMES DAILY AS NEEDED FOR MUSCLE  SPASMS (Patient taking differently: Take 5 mg by mouth 3 (three) times daily as needed for muscle spasms.) 90 tablet 5  . benzonatate (TESSALON) 100 MG capsule Take 200 mg by mouth 3 (three) times daily as needed for cough.    Marland Kitchen BREO ELLIPTA 200-25 MCG/INH AEPB INHALE ONE DOSE BY MOUTH DAILY 60 each 0  . cetirizine (ZYRTEC) 10 MG tablet Take 1 tablet (10 mg total) by mouth every morning. 30 tablet 5  . Cyanocobalamin (VITAMIN B 12 PO) Take 1,000 mg by mouth daily.     . DULoxetine (CYMBALTA) 60 MG capsule TAKE ONE CAPSULE BY MOUTH DAILY 30 capsule 2  . ELIQUIS 5 MG TABS tablet TAKE ONE TABLET BY MOUTH TWICE A DAY 60 tablet 2  . fluticasone (FLONASE) 50 MCG/ACT nasal spray SHAKE LIQUID AND USE 1 SPRAY IN EACH NOSTRIL DAILY--  in am 16 g 5  . folic acid (FOLVITE) 1 MG tablet Take 2 mg by mouth daily.     . Golimumab ([place] ARIA IV) Inject into the vein.    Marland Kitchen ipratropium (ATROVENT) 0.03 % nasal spray Place 2 sprays into both nostrils 3 (three) times daily as needed (nasal drainage, throat clearing). 30 mL 5  . methocarbamol (ROBAXIN) 500 MG tablet Take 1 tablet (500 mg total) by mouth 2 (two) times daily as needed for muscle spasms. 60 tablet 1  . methotrexate 2.5 MG tablet Take 15 mg by mouth once a week. Friday in the evening    . montelukast (SINGULAIR) 10  MG tablet TAKE ONE TABLET BY MOUTH AT BEDTIME 30 tablet 0  . mupirocin nasal ointment (BACTROBAN) 2 % Place 1 application into the nose 2 (two) times daily for 10 days. 20 g 0  . omeprazole (PRILOSEC) 40 MG capsule TAKE 1 CAPSULE(40 MG) BY MOUTH DAILY 90 capsule 2  . oxybutynin (DITROPAN) 5 MG tablet Take 5 mg by mouth 2 (two) times daily.   11  . pregabalin (LYRICA) 75 MG capsule TAKE ONE CAPSULE BY MOUTH TWICE A DAY 60 capsule 4  . timolol (TIMOPTIC) 0.5 % ophthalmic solution Place 1 drop into both eyes daily.     Marland Kitchen topiramate (TOPAMAX) 25 MG tablet Take 75 mg by mouth at bedtime.   1  . Vitamin D, Ergocalciferol, (DRISDOL) 1.25 MG (50000  UNIT) CAPS capsule TAKE 1 CAPSULE BY MOUTH EVERY 10 DAYS 3 capsule 4   No current facility-administered medications for this visit.     Known medication allergies: Allergies  Allergen Reactions  . Other Nausea And Vomiting    Darvocet N-100 Other reaction(s): Vomiting  . Darvon [Propoxyphene] Nausea And Vomiting     Physical examination: Blood pressure 122/88, pulse 95, temperature 97.9 F (36.6 C), resp. rate 14, height 5\' 10"  (1.778 m), weight (!) 360 lb (163.3 kg), last menstrual period 03/05/2019, SpO2 98 %.  General: Alert, interactive, in no acute distress. HEENT: PERRLA, TMs pearly gray, turbinates non-edematous without discharge, post-pharynx non erythematous. Neck: Supple without lymphadenopathy. Lungs: Clear to auscultation without wheezing, rhonchi or rales. {no increased work of breathing. CV: Normal S1, S2 without murmurs. Abdomen: Nondistended, nontender. Skin: Warm and dry, without lesions or rashes. Extremities:  No clubbing, cyanosis or edema. Neuro:   Grossly intact.  Diagnositics/Labs: None today  Assessment and plan:   Allergic rhinitis with conjunctivitis and migraines     - continue avoidance measures for tree pollen, molds, dust mites and cockroach.     - continue use of Cetirizine 10mg  daily    - continue Singulair 10mg  daily at night    - continue Fluticasone 1-2 sprays each nostril daily for nasal congestion    - for nasal drainage/throat clearing use nasal Ipratropium 2 sprays each nostril up to 3 times a day as needed.       - use nasal saline spray to help keep nose clean and moisturized    - consider allergen immunotherapy (allergy shots) if medication management becomes ineffective in controlling allergy symptoms.     - for nasal sores use Mupirocin ointment apply to the nose with q-tip twice a day for 10 days at a time.    Asthma, adult-onset    - under good control at this time    - continue Breo 200 mcg 1 puff daily    - have access to  Ventolin inhaler 2 puffs every 4-6 hours as needed for cough/wheeze/shortness of breath/chest tightness.  May use 15-20 minutes prior to activity.   Monitor frequency of use.    Asthma control goals:   Full participation in all desired activities (may need albuterol before activity)  Albuterol use two time or less a week on average (not counting use with activity)  Cough interfering with sleep two time or less a month  Oral steroids no more than once a year  No hospitalizations   Follow-up 6 months or sooner if needed I appreciate the opportunity to take part in Holly's care. Please do not hesitate to contact me with questions.  Sincerely,   Prudy Feeler,  MD Allergy/Immunology [facility]

## 2020-07-25 NOTE — Patient Instructions (Addendum)
Allergic rhinitis with conjunctivitis and migraines     - continue avoidance measures for tree pollen, molds, dust mites and cockroach.     - continue use of Cetirizine 10mg  daily    - continue Singulair 10mg  daily at night    - continue Fluticasone 1-2 sprays each nostril daily for nasal congestion    - for nasal drainage/throat clearing use nasal Ipratropium 2 sprays each nostril up to 3 times a day as needed.       - use nasal saline spray to help keep nose clean and moisturized    - consider allergen immunotherapy (allergy shots) if medication management becomes ineffective in controlling allergy symptoms.     - for nasal sores use Mupirocin ointment apply to the nose with q-tip twice a day for 10 days at a time.    Asthma, adult-onset    - under good control at this time   - continue Breo 200 mcg 1 puff daily     - have access to Ventolin inhaler 2 puffs every 4-6 hours as needed for cough/wheeze/shortness of breath/chest tightness.  May use 15-20 minutes prior to activity.   Monitor frequency of use.    Asthma control goals:   Full participation in all desired activities (may need albuterol before activity)  Albuterol use two time or less a week on average (not counting use with activity)  Cough interfering with sleep two time or less a month  Oral steroids no more than once a year  No hospitalizations   Follow-up 6 months or sooner if needed

## 2020-08-01 ENCOUNTER — Other Ambulatory Visit: Payer: Self-pay | Admitting: Allergy

## 2020-08-01 DIAGNOSIS — J3089 Other allergic rhinitis: Secondary | ICD-10-CM

## 2020-08-12 ENCOUNTER — Other Ambulatory Visit: Payer: Self-pay | Admitting: Physical Medicine & Rehabilitation

## 2020-09-26 ENCOUNTER — Other Ambulatory Visit (HOSPITAL_BASED_OUTPATIENT_CLINIC_OR_DEPARTMENT_OTHER)
Admission: RE | Admit: 2020-09-26 | Discharge: 2020-09-26 | Disposition: A | Discharge status: Home / Self Care | Payer: 59 | Source: Ambulatory Visit | Attending: Obstetrics & Gynecology | Admitting: Obstetrics & Gynecology

## 2020-09-26 ENCOUNTER — Other Ambulatory Visit (HOSPITAL_COMMUNITY)
Admission: RE | Admit: 2020-09-26 | Discharge: 2020-09-26 | Disposition: A | Discharge status: Home / Self Care | Payer: 59 | Source: Ambulatory Visit | Attending: Obstetrics & Gynecology | Admitting: Obstetrics & Gynecology

## 2020-09-26 ENCOUNTER — Other Ambulatory Visit: Payer: Self-pay

## 2020-09-26 ENCOUNTER — Ambulatory Visit (INDEPENDENT_AMBULATORY_CARE_PROVIDER_SITE_OTHER): Payer: 59 | Admitting: Obstetrics & Gynecology

## 2020-09-26 ENCOUNTER — Encounter (HOSPITAL_BASED_OUTPATIENT_CLINIC_OR_DEPARTMENT_OTHER): Payer: Self-pay | Admitting: Obstetrics & Gynecology

## 2020-09-26 VITALS — BP 112/84 | HR 87 | Ht 70.0 in | Wt 348.0 lb

## 2020-09-26 DIAGNOSIS — B977 Papillomavirus as the cause of diseases classified elsewhere: Secondary | ICD-10-CM | POA: Insufficient documentation

## 2020-09-26 DIAGNOSIS — Z01419 Encounter for gynecological examination (general) (routine) without abnormal findings: Secondary | ICD-10-CM | POA: Insufficient documentation

## 2020-09-26 DIAGNOSIS — Z86711 Personal history of pulmonary embolism: Secondary | ICD-10-CM

## 2020-09-26 DIAGNOSIS — Z90710 Acquired absence of both cervix and uterus: Secondary | ICD-10-CM

## 2020-09-26 DIAGNOSIS — Z87898 Personal history of other specified conditions: Secondary | ICD-10-CM

## 2020-09-26 DIAGNOSIS — E66813 Obesity, class 3: Secondary | ICD-10-CM

## 2020-09-26 DIAGNOSIS — Z113 Encounter for screening for infections with a predominantly sexual mode of transmission: Secondary | ICD-10-CM | POA: Diagnosis not present

## 2020-09-26 DIAGNOSIS — Z8739 Personal history of other diseases of the musculoskeletal system and connective tissue: Secondary | ICD-10-CM

## 2020-09-26 DIAGNOSIS — G4733 Obstructive sleep apnea (adult) (pediatric): Secondary | ICD-10-CM

## 2020-09-26 DIAGNOSIS — Z8619 Personal history of other infectious and parasitic diseases: Secondary | ICD-10-CM | POA: Diagnosis not present

## 2020-09-26 DIAGNOSIS — M1990 Unspecified osteoarthritis, unspecified site: Secondary | ICD-10-CM

## 2020-09-26 LAB — HEPATITIS C ANTIBODY: HCV Ab: NONREACTIVE

## 2020-09-26 LAB — HIV ANTIBODY (ROUTINE TESTING W REFLEX): HIV Screen 4th Generation wRfx: NONREACTIVE

## 2020-09-26 LAB — HEPATITIS B SURFACE ANTIGEN: Hepatitis B Surface Ag: NONREACTIVE

## 2020-09-26 NOTE — Progress Notes (Signed)
45 y.o. G0P0000 Divorced Black or [place] American female here for annual exam.  Doing well.  So pleased with how she's felt this last year.  Was released from [facility] after surgery because anemia resolved.  Denies vaginal bleeding or vaginal discharge.    Patient's last menstrual period was 03/05/2019.          Sexually active: No.  The current method of family planning is status post hysterectomy.    Exercising: No Smoker:  no  Health Maintenance: Pap:  Will obtain today History of abnormal Pap:  History of + high risk HPV MMG:  Done 03/2020 Colonoscopy:  Guidelines discussed TDaP:  2014 Pneumonia vaccine(s):  Completed 10/2016 Shingrix:   Planning to have this done Hep C testing: today Screening Labs: does with PCP   reports that she has never smoked. She has never used smokeless tobacco. She reports current alcohol use. She reports that she does not use drugs.  Past Medical History:  Diagnosis Date  . Arthritis    inflammatory  . 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 Bladder spasm   . Fibromyalgia   . GERD (gastroesophageal reflux disease)   . History of chronic bronchitis    last flare up 11-18-17  . History of iron deficiency anemia   . History of pulmonary embolism 10/2016--- followed by pcp and pulmoloigst   dx bilateral PE , bilateral lower lobes , segmental , unprovoked, negative hypercoagulable work-up-- treated w/ eliquis  . Migraine   . Mild intermittent asthma    pulmologist-  [facility]  . Mixed connective tissue disease ([place])   . Morbid obesity with BMI of 45.0-49.9, adult ([place])   . Open-angle glaucoma of both eyes   . Pre-diabetes   . 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 Sleep apnea   . Vitamin D deficiency     Past Surgical History:  Procedure Laterality Date  . ANAL EXAMINATION UNDER ANESTHESIA  age 72   w/ removal cyst  . CYSTOSCOPY N/A 04/09/2019   Procedure: CYSTOSCOPY;  Surgeon: Holly Salon, MD;   Location: [facility];  Service: Gynecology;  Laterality: N/A;  . DILATATION & CURETTAGE/HYSTEROSCOPY WITH MYOSURE N/A 10/17/2017   Procedure: DILATATION & CURETTAGE/HYSTEROSCOPY WITH MYOSURE;  Surgeon: Holly Auerbach, MD;  Location: [facility];  Service: Gynecology;  Laterality: N/A;  . DILATATION & CURETTAGE/HYSTEROSCOPY WITH MYOSURE N/A 04/11/2018   Procedure: DILATATION & CURETTAGE/HYSTEROSCOPY WITH MYOSURE;  Surgeon: Holly Auerbach, MD;  Location: [facility];  Service: Gynecology;  Laterality: N/A;  . GYNECOLOGIC CRYOSURGERY  age 59s  . TONSILLECTOMY  age 40  . TOTAL LAPAROSCOPIC HYSTERECTOMY WITH SALPINGECTOMY Bilateral 04/09/2019   Procedure: TOTAL LAPAROSCOPIC HYSTERECTOMY WITH SALPINGECTOMY;  Surgeon: Holly Salon, MD;  Location: [facility];  Service: Gynecology;  Laterality: Bilateral;  Has Mirena IUD in place, 3 hours surgery time. Extended recovery bed.  . TRANSTHORACIC ECHOCARDIOGRAM  01/17/2017   ef 60-65%/  mild TR    Current Outpatient Medications  Medication Sig Dispense Refill  . albuterol (VENTOLIN HFA) 108 (90 Base) MCG/ACT inhaler Inhale 2 puffs into the lungs every 4 (four) hours as needed. 18 g 1  . baclofen (LIORESAL) 10 MG tablet TAKE 1 TABLET BY MOUTH THREE TIMES DAILY AS NEEDED FOR MUSCLE SPASMS (Patient taking differently: Take 5 mg by mouth 3 (three) times daily as needed for muscle spasms.) 90 tablet 5  . benzonatate (TESSALON) 100 MG capsule Take  200 mg by mouth 3 (three) times daily as needed for cough.    Marland Kitchen BREO ELLIPTA 200-25 MCG/INH AEPB INHALE ONE DOSE BY MOUTH DAILY 60 each 5  . cetirizine (ZYRTEC) 10 MG tablet Take 1 tablet (10 mg total) by mouth every morning. 30 tablet 5  . Cyanocobalamin (VITAMIN B 12 PO) Take 1,000 mg by mouth daily.     . DULoxetine (CYMBALTA) 60 MG capsule TAKE ONE CAPSULE BY MOUTH DAILY 30 capsule 2  . ELIQUIS 5 MG TABS tablet TAKE ONE TABLET BY MOUTH TWICE A DAY 60 tablet 2  . fluticasone (FLONASE) 50  MCG/ACT nasal spray SHAKE LIQUID AND USE 1 SPRAY IN EACH NOSTRIL DAILY--  in am 16 g 5  . folic acid (FOLVITE) 1 MG tablet Take 2 mg by mouth daily.     . Golimumab ([place] ARIA IV) Inject into the vein.    Marland Kitchen ipratropium (ATROVENT) 0.03 % nasal spray Place 2 sprays into both nostrils 3 (three) times daily as needed (nasal drainage, throat clearing). 30 mL 5  . methocarbamol (ROBAXIN) 500 MG tablet Take 1 tablet (500 mg total) by mouth 2 (two) times daily as needed for muscle spasms. 60 tablet 1  . methotrexate 2.5 MG tablet Take 15 mg by mouth once a week. Friday in the evening    . montelukast (SINGULAIR) 10 MG tablet TAKE ONE TABLET BY MOUTH EVERY NIGHT AT BEDTIME 30 tablet 5  . omeprazole (PRILOSEC) 40 MG capsule TAKE 1 CAPSULE(40 MG) BY MOUTH DAILY 90 capsule 2  . oxybutynin (DITROPAN) 5 MG tablet Take 5 mg by mouth 2 (two) times daily.   11  . pregabalin (LYRICA) 75 MG capsule TAKE ONE CAPSULE BY MOUTH TWICE A DAY 60 capsule 4  . timolol (TIMOPTIC) 0.5 % ophthalmic solution Place 1 drop into both eyes daily.     Marland Kitchen topiramate (TOPAMAX) 25 MG tablet Take 75 mg by mouth at bedtime.   1  . Vitamin D, Ergocalciferol, (DRISDOL) 1.25 MG (50000 UNIT) CAPS capsule TAKE 1 CAPSULE BY MOUTH EVERY 10 DAYS 3 capsule 4  . mupirocin nasal ointment (BACTROBAN) 2 % Place 1 application into the nose 2 (two) times daily for 10 days. 20 g 0   No current facility-administered medications for this visit.    Family History  Problem Relation Age of Onset  . Pulmonary embolism Mother        Last year  . Mental retardation Mother        PTSD and fibromyalgia  . Heart disease Mother        CHF  . Congestive Heart Failure Mother   . Heart disease Father 3       CAD  . Hyperlipidemia Father   . Diabetes Paternal Grandmother   . Heart attack Paternal Grandmother   . Congestive Heart Failure Maternal Aunt   . Cancer Neg Hx     Review of Systems  Exam:   BP 112/84   Pulse 87   Ht 5\' 10"  (1.778 m)    Wt (!) 348 lb (157.9 kg)   LMP 03/05/2019   BMI 49.93 kg/m   Height: 5\' 10"  (177.8 cm)  General appearance: alert, cooperative and appears stated age Head: Normocephalic, without obvious abnormality, atraumatic Neck: no adenopathy, supple, symmetrical, trachea midline and thyroid normal to inspection and palpation Lungs: clear to auscultation bilaterally Breasts: normal appearance, no masses or tenderness Heart: regular rate and rhythm Abdomen: soft, non-tender; bowel sounds normal; no masses,  no organomegaly Extremities: extremities normal, atraumatic, no cyanosis or edema Skin: Skin color, texture, turgor normal. No rashes or lesions Lymph nodes: Cervical, supraclavicular, and axillary nodes normal. No abnormal inguinal nodes palpated Neurologic: Grossly normal   Pelvic: External genitalia:  no lesions              Urethra:  normal appearing urethra with no masses, tenderness or lesions              Bartholins and Skenes: normal                 Vagina: normal appearing vagina with normal color and no discharge, no lesions              Cervix: absent              Pap taken: No. Bimanual Exam:  Uterus:  uterus absent              Adnexa: no mass, fullness, tenderness               Rectovaginal: Confirms               Anus:  normal sphincter tone, no lesions  Chaperone, Prince Rome, CMA, was present for exam.  Assessment/Plan: 1. Well woman exam with routine gynecological exam - Pap and HR HPV obtained today - MMG completed 03/2020 - colon cancer screening guidelines reviewed - vaccines updated - does blood work with rheumatologist regularly  2. High risk HPV infection - will repeat pap to assess for HPV clearance - Cytology - PAP( [facility])  3. H/O: hysterectomy - 2020 TLH with bilateral salpingectomy  4. Screening examination for STD (sexually transmitted disease) - Cytology - PAP( [facility]) - RPR; Future - HIV Antibody (routine testing w rflx); Future -  Hepatitis B surface antigen; Future - Hepatitis C antibody; Future  5. History of pulmonary embolus (PE) - off eliquis after having retinal hemorrhage  6.  Obesity, Class III, BMI 40-49.9 (morbid obesity) ([place])  7. OSA (obstructive sleep apnea)  8.  H/O mixed connective tissue disease

## 2020-09-27 LAB — RPR: RPR Ser Ql: NONREACTIVE

## 2020-09-28 ENCOUNTER — Encounter (HOSPITAL_BASED_OUTPATIENT_CLINIC_OR_DEPARTMENT_OTHER): Payer: Self-pay | Admitting: Obstetrics & Gynecology

## 2020-09-28 DIAGNOSIS — Z90710 Acquired absence of both cervix and uterus: Secondary | ICD-10-CM | POA: Insufficient documentation

## 2020-09-28 DIAGNOSIS — B977 Papillomavirus as the cause of diseases classified elsewhere: Secondary | ICD-10-CM | POA: Insufficient documentation

## 2020-09-29 LAB — CYTOLOGY - PAP
Chlamydia: NEGATIVE
Comment: NEGATIVE
Comment: NEGATIVE
Comment: NEGATIVE
Comment: NORMAL
Diagnosis: NEGATIVE
High risk HPV: NEGATIVE
Neisseria Gonorrhea: NEGATIVE
Trichomonas: NEGATIVE

## 2020-10-09 ENCOUNTER — Encounter (HOSPITAL_BASED_OUTPATIENT_CLINIC_OR_DEPARTMENT_OTHER): Payer: Self-pay | Admitting: Obstetrics & Gynecology

## 2020-11-15 ENCOUNTER — Other Ambulatory Visit: Payer: Self-pay | Admitting: Physical Medicine & Rehabilitation

## 2020-12-25 ENCOUNTER — Other Ambulatory Visit: Payer: Self-pay | Admitting: Physical Medicine & Rehabilitation

## 2021-01-25 ENCOUNTER — Other Ambulatory Visit: Payer: Self-pay | Admitting: Allergy

## 2021-01-25 DIAGNOSIS — J3089 Other allergic rhinitis: Secondary | ICD-10-CM

## 2021-01-29 ENCOUNTER — Other Ambulatory Visit: Payer: Self-pay

## 2021-01-29 ENCOUNTER — Encounter: Payer: Self-pay | Admitting: Allergy

## 2021-01-29 ENCOUNTER — Ambulatory Visit (INDEPENDENT_AMBULATORY_CARE_PROVIDER_SITE_OTHER): Payer: 59 | Admitting: Allergy

## 2021-01-29 VITALS — BP 124/76 | HR 88 | Temp 97.7°F | Resp 18 | Ht 70.0 in | Wt 346.4 lb

## 2021-01-29 DIAGNOSIS — J3089 Other allergic rhinitis: Secondary | ICD-10-CM

## 2021-01-29 DIAGNOSIS — G43009 Migraine without aura, not intractable, without status migrainosus: Secondary | ICD-10-CM

## 2021-01-29 DIAGNOSIS — H1013 Acute atopic conjunctivitis, bilateral: Secondary | ICD-10-CM

## 2021-01-29 DIAGNOSIS — J4530 Mild persistent asthma, uncomplicated: Secondary | ICD-10-CM | POA: Diagnosis not present

## 2021-01-29 NOTE — Patient Instructions (Signed)
Allergic rhinitis with conjunctivitis and migraines     - continue avoidance measures for tree pollen, molds, dust mites and cockroach.     - continue use of Cetirizine 10mg  daily    - continue Singulair 10mg  daily at night    -Start using fluticasone 1-2 sprays each nostril daily for nasal congestion at this time    - for nasal drainage/throat clearing use nasal Ipratropium 2 sprays each nostril up to 3 times a day as needed.       - use nasal saline spray to help keep nose clean and moisturized    - consider allergen immunotherapy (allergy shots) if medication management becomes ineffective in controlling allergy symptoms.     - for nasal sores use Mupirocin ointment apply to the nose with q-tip twice a day for 10 days at a time.    Asthma, adult-onset    - under good control at this time   - continue Breo 200 mcg 1 puff daily     - have access to Ventolin inhaler 2 puffs every 4-6 hours as needed for cough/wheeze/shortness of breath/chest tightness.  May use 15-20 minutes prior to activity.   Monitor frequency of use.    Asthma control goals:  Full participation in all desired activities (may need albuterol before activity) Albuterol use two time or less a week on average (not counting use with activity) Cough interfering with sleep two time or less a month Oral steroids no more than once a year No hospitalizations   Follow-up 9 months or sooner if needed

## 2021-01-29 NOTE — Progress Notes (Signed)
Follow-up Note  RE: [facility] MRN: [id number] DOB: 1975-08-21 Date of Office Visit: 01/29/2021   History of present illness: [facility] is a 45 y.o. female presenting today for follow-up of allergic rhinitis with conjunctivitis, asthma  and history of migraines.  She was last seen in the office on 07/25/2020 by myself.  She states she has been stable since this last visit.  She has not had any changes with her health, surgeries or hospitalizations.  Her allergic rhinitis with conjunctivitis she states is pretty under control with use of cetirizine and Singulair daily.  She has infrequent use of her nasal spray.  She states she has not needed to use the mupirocin for nasal sores. Her asthma is also under good control.  She has not required use of albuterol for rescue of symptoms.  She continues taking Breo 1 puff once a day.  She has not had any ED or urgent care visits or any systemic steroid needs.  She denies any daytime or nighttime symptoms at this time.  Review of systems: Review of Systems  Constitutional: Negative.   HENT: Negative.    Eyes: Negative.   Respiratory: Negative.    Gastrointestinal: Negative.   Musculoskeletal: Negative.   Skin: Negative.   Neurological: Negative.    All other systems negative unless noted above in HPI  Past medical/social/surgical/family history have been reviewed and are unchanged unless specifically indicated below.  No changes  Medication List: Current Outpatient Medications  Medication Sig Dispense Refill   albuterol (VENTOLIN HFA) 108 (90 Base) MCG/ACT inhaler Inhale 2 puffs into the lungs every 4 (four) hours as needed. 18 g 1   baclofen (LIORESAL) 10 MG tablet TAKE 1 TABLET BY MOUTH THREE TIMES DAILY AS NEEDED FOR MUSCLE SPASMS (Patient taking differently: Take 5 mg by mouth 3 (three) times daily as needed for muscle spasms.) 90 tablet 5   benzonatate (TESSALON) 100 MG capsule Take 200 mg by mouth 3 (three)  times daily as needed for cough.     BREO ELLIPTA 200-25 MCG/INH AEPB INHALE ONE DOSE BY MOUTH DAILY 60 each 5   cetirizine (ZYRTEC) 10 MG tablet Take 1 tablet (10 mg total) by mouth every morning. 30 tablet 5   Cyanocobalamin (VITAMIN B 12 PO) Take 1,000 mg by mouth daily.      DULoxetine (CYMBALTA) 60 MG capsule TAKE ONE CAPSULE BY MOUTH DAILY 30 capsule 2   ELIQUIS 5 MG TABS tablet TAKE ONE TABLET BY MOUTH TWICE A DAY 60 tablet 2   fluticasone (FLONASE) 50 MCG/ACT nasal spray SHAKE LIQUID AND USE 1 SPRAY IN EACH NOSTRIL DAILY--  in am 16 g 5   folic acid (FOLVITE) 1 MG tablet Take 2 mg by mouth daily.      Golimumab ([place] ARIA IV) Inject into the vein.     ipratropium (ATROVENT) 0.03 % nasal spray Place 2 sprays into both nostrils 3 (three) times daily as needed (nasal drainage, throat clearing). 30 mL 5   methocarbamol (ROBAXIN) 500 MG tablet Take 1 tablet (500 mg total) by mouth 2 (two) times daily as needed for muscle spasms. 60 tablet 1   methotrexate 2.5 MG tablet Take 15 mg by mouth once a week. Friday in the evening     montelukast (SINGULAIR) 10 MG tablet TAKE ONE TABLET BY MOUTH EVERY NIGHT AT BEDTIME 90 tablet 0   omeprazole (PRILOSEC) 40 MG capsule TAKE 1 CAPSULE(40 MG) BY MOUTH DAILY 90 capsule 2  oxybutynin (DITROPAN) 5 MG tablet Take 5 mg by mouth 2 (two) times daily.   11   pregabalin (LYRICA) 75 MG capsule TAKE ONE CAPSULE BY MOUTH TWICE A DAY 60 capsule 2   timolol (TIMOPTIC) 0.5 % ophthalmic solution Place 1 drop into both eyes daily.      topiramate (TOPAMAX) 25 MG tablet Take 75 mg by mouth at bedtime.   1   Vitamin D, Ergocalciferol, (DRISDOL) 1.25 MG (50000 UNIT) CAPS capsule TAKE 1 CAPSULE BY MOUTH EVERY 10 DAYS 3 capsule 4   mupirocin nasal ointment (BACTROBAN) 2 % Place 1 application into the nose 2 (two) times daily for 10 days. 20 g 0   No current facility-administered medications for this visit.     Known medication allergies: Allergies  Allergen Reactions    Other Nausea And Vomiting    Darvocet N-100 Other reaction(s): Vomiting   Darvon [Propoxyphene] Nausea And Vomiting     Physical examination: Blood pressure 124/76, pulse 88, temperature 97.7 F (36.5 C), temperature source Temporal, resp. rate 18, height 5\' 10"  (1.778 m), weight (!) 346 lb 6 oz (157.1 kg), last menstrual period 03/05/2019, SpO2 100 %.  General: Alert, interactive, in no acute distress. HEENT: PERRLA, TMs pearly gray, turbinates moderately edematous without discharge, post-pharynx non erythematous. Neck: Supple without lymphadenopathy. Lungs: Clear to auscultation without wheezing, rhonchi or rales. {no increased work of breathing. CV: Normal S1, S2 without murmurs. Abdomen: Nondistended, nontender. Skin: Warm and dry, without lesions or rashes. Extremities:  No clubbing, cyanosis or edema. Neuro:   Grossly intact.  Diagnositics/Labs:  Spirometry: FEV1: 2.39L 79%, FVC: 2.83L 76% predicted.  FEV1 is essentially normal however FVC is slightly reduced 80 demographics.  Assessment and plan:   Allergic rhinitis with conjunctivitis and migraines     - continue avoidance measures for tree pollen, molds, dust mites and cockroach.     - continue use of Cetirizine 10mg  daily    - continue Singulair 10mg  daily at night    -Start using fluticasone 1-2 sprays each nostril daily for nasal congestion at this time    - for nasal drainage/throat clearing use nasal Ipratropium 2 sprays each nostril up to 3 times a day as needed.       - use nasal saline spray to help keep nose clean and moisturized    - consider allergen immunotherapy (allergy shots) if medication management becomes ineffective in controlling allergy symptoms.     - for nasal sores use Mupirocin ointment apply to the nose with q-tip twice a day for 10 days at a time.    Asthma, adult-onset    - under good control at this time   - continue Breo 200 mcg 1 puff daily     - have access to Ventolin inhaler 2  puffs every 4-6 hours as needed for cough/wheeze/shortness of breath/chest tightness.  May use 15-20 minutes prior to activity.   Monitor frequency of use.    Asthma control goals:  Full participation in all desired activities (may need albuterol before activity) Albuterol use two time or less a week on average (not counting use with activity) Cough interfering with sleep two time or less a month Oral steroids no more than once a year No hospitalizations   Follow-up 9 months or sooner if needed  I appreciate the opportunity to take part in Holly's care. Please do not hesitate to contact me with questions.  Sincerely,   Prudy Feeler, MD Allergy/Immunology [facility]

## 2021-02-06 ENCOUNTER — Ambulatory Visit: Payer: 59 | Admitting: Physical Medicine and Rehabilitation

## 2021-02-16 ENCOUNTER — Telehealth: Payer: Self-pay

## 2021-02-16 MED ORDER — DULOXETINE HCL 60 MG PO CPEP: 60.0000 mg | ORAL_CAPSULE | Freq: Every day | ORAL | 0 refills | Status: DC

## 2021-02-16 NOTE — Telephone Encounter (Signed)
Refill request for Duloxetine 60 mg

## 2021-02-17 ENCOUNTER — Ambulatory Visit: Payer: 59 | Admitting: Physical Medicine and Rehabilitation

## 2021-02-21 ENCOUNTER — Other Ambulatory Visit: Payer: Self-pay

## 2021-02-21 ENCOUNTER — Emergency Department (HOSPITAL_COMMUNITY): Payer: 59

## 2021-02-21 ENCOUNTER — Emergency Department (HOSPITAL_COMMUNITY)
Admission: EM | Admit: 2021-02-21 | Discharge: 2021-02-21 | Disposition: A | Discharge status: Home / Self Care | Payer: 59 | Attending: Emergency Medicine | Admitting: Emergency Medicine

## 2021-02-21 ENCOUNTER — Encounter (HOSPITAL_COMMUNITY): Payer: Self-pay | Admitting: Emergency Medicine

## 2021-02-21 DIAGNOSIS — M545 Low back pain, unspecified: Secondary | ICD-10-CM | POA: Diagnosis present

## 2021-02-21 DIAGNOSIS — Z7901 Long term (current) use of anticoagulants: Secondary | ICD-10-CM | POA: Diagnosis not present

## 2021-02-21 DIAGNOSIS — J4520 Mild intermittent asthma, uncomplicated: Secondary | ICD-10-CM | POA: Insufficient documentation

## 2021-02-21 DIAGNOSIS — R110 Nausea: Secondary | ICD-10-CM | POA: Insufficient documentation

## 2021-02-21 DIAGNOSIS — R1031 Right lower quadrant pain: Secondary | ICD-10-CM | POA: Diagnosis not present

## 2021-02-21 DIAGNOSIS — R1011 Right upper quadrant pain: Secondary | ICD-10-CM | POA: Diagnosis not present

## 2021-02-21 DIAGNOSIS — R3915 Urgency of urination: Secondary | ICD-10-CM | POA: Diagnosis not present

## 2021-02-21 LAB — URINALYSIS, ROUTINE W REFLEX MICROSCOPIC
Bilirubin Urine: NEGATIVE
Glucose, UA: NEGATIVE mg/dL
Hgb urine dipstick: NEGATIVE
Ketones, ur: NEGATIVE mg/dL
Leukocytes,Ua: NEGATIVE
Nitrite: NEGATIVE
Protein, ur: NEGATIVE mg/dL
Specific Gravity, Urine: 1.025 (ref 1.005–1.030)
pH: 6 (ref 5.0–8.0)

## 2021-02-21 MED ORDER — LIDOCAINE 5 % EX OINT: 1.0000 "application " | TOPICAL_OINTMENT | CUTANEOUS | 0 refills | Status: AC | PRN

## 2021-02-21 MED ORDER — ACETAMINOPHEN 500 MG PO TABS
1000.0000 mg | ORAL_TABLET | Freq: Once | ORAL | Status: AC
  Administered 2021-02-21: 1000 mg via ORAL
  Filled 2021-02-21: qty 2

## 2021-02-21 NOTE — ED Triage Notes (Signed)
Patient complains of lower left back pain and abdominal pain with palpation. Symptoms started earlier this week.

## 2021-02-21 NOTE — Discharge Instructions (Addendum)
You were seen here today for evaluation of your low back pain.  Your labs and imaging were negative for a UTI or renal stone.  Because of your Eliquis use, please take Tylenol as needed for pain.  Use follow-up with your primary care provider.  If you have any new or worsening symptoms such as weakness, bowel incontinence, saddle anesthesia, numbness or tingling, please return to the nearest emergency department.

## 2021-02-21 NOTE — ED Provider Notes (Signed)
Emergency Medicine Provider Triage Evaluation Note  [facility] , a 45 y.o. female  with h/o bladder spasms was evaluated in triage.  Pt complains of lower back pain and urinary urgency/frequency along with mild dysuria. The patient had a telemedicine visit and was given Macrobid. She reports she is still having pain despite the antibiotics. Additionally, she is complaining of right sided upper and lower abdominal pain.   Review of Systems  Positive: Dysuria, urinary urg/freq, abdominal pain, back pain, nausea Negative: Fevers, vomiting, diarrhea, constipation, hematuria,   Physical Exam  BP 132/77 (BP Location: Left Arm)   Pulse 76   Temp 98.5 F (36.9 C) (Oral)   Resp 18   LMP 03/05/2019   SpO2 98%  Gen:   Awake, no distress   Resp:  Normal effort  MSK:   Moves extremities without difficulty  Other:  Mild generalized abdominal pain  Medical Decision Making  Medically screening exam initiated at 11:29 AM.  Appropriate orders placed.  Holly [facility]onald was informed that the remainder of the evaluation will be completed by another provider, this initial triage assessment does not replace that evaluation, and the importance of remaining in the ED until their evaluation is complete.  Labs ordered.    Sherrell Puller, PA-C 02/21/21 1146    Wyvonnia Dusky, MD 02/21/21 1444

## 2021-02-21 NOTE — ED Provider Notes (Signed)
[facility] DEPT Provider Note   CSN: [id number] Arrival date & time: 02/21/21  1114     History Chief Complaint  Patient presents with   Back Pain   Abdominal Pain    Holly Mcdonald is a 45 y.o. female with history of bladder spasms, inflammatory arthritis, connective tissue disorder, fibromyalgia presents the emergency department for evaluation of lower back pain for the past 6 days.  She reports the pain worsens with moving and palpation.  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  Currently on Eliquis for PE.  She has chronic bladder spasms with urinary urgency/frequency and incontinence occasionally but not new to her.  Because of the low back pain, she thought she may have a UTI and was seen via telemedicine on Wednesday and given nitrofurantoin, but has not found any relief.  She admits some mild nausea intermittently.  She denies any vomiting, diarrhea, constipation, hematuria, vaginal discharge, fever, chest pain, shortness of breath.  She reports bilateral low back pain that is intermittent and sharp left greater than right, but no radiation.  She reports the pain feels like contraction.  Patient mentions some right upper and right lower abdominal pain that is mild and intermittent, reports she is not too concerned.  No known drug allergies.  Denies any tobacco, EtOH, or drug use.   Back Pain Associated symptoms: abdominal pain and dysuria   Associated symptoms: no chest pain and no fever   Abdominal Pain Associated symptoms: dysuria and nausea   Associated symptoms: no chest pain, no chills, no cough, no fever, no hematuria, no shortness of breath, no sore throat, no vaginal bleeding, no vaginal discharge and no vomiting       Past Medical History:  Diagnosis Date   Arthritis    inflammatory   ASCUS with positive high risk HPV cervical 01/2019   Bladder spasm    Fibromyalgia    GERD (gastroesophageal  reflux disease)    History of chronic bronchitis    last flare up 11-18-17   History of iron deficiency anemia    History of pulmonary embolism 10/2016--- followed by pcp and pulmoloigst   dx bilateral PE , bilateral lower lobes , segmental , unprovoked, negative hypercoagulable work-up-- treated w/ eliquis   Migraine    Mild intermittent asthma    pulmologist-  dr Elsworth Soho   Mixed connective tissue disease ([place])    Morbid obesity with BMI of 45.0-49.9, adult ([place])    Open-angle glaucoma of both eyes    Pre-diabetes    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   bilateral ---- stopped eliquis   Sleep apnea    Vitamin D deficiency     Patient Active Problem List   Diagnosis Date Noted   H/O: hysterectomy 09/28/2020   High risk HPV infection 09/28/2020   Abnormality of gait 12/25/2019   Morbid obesity ([place]) 06/26/2019   OSA (obstructive sleep apnea) 03/01/2019   Daytime sleepiness 01/26/2019   Sleep disorder 12/14/2018   Fibromyalgia 08/16/2018   Autoimmune disease ([place]) 03/24/2018   History of pulmonary embolus (PE) 03/24/2018   B12 deficiency 02/15/2018   Connective tissue disease ([place]) 02/08/2018   Inflammatory arthritis 02/08/2018   Chronic pain syndrome 12/16/2017   Incontinence of urine in female 09/16/2017   Glaucoma, open angle, mild stage 07/27/2017   Polyarthralgia 07/06/2017   Retinal hemorrhage noted on examination of both eyes 06/30/2017   Allergic rhinitis 03/07/2017   Hyperlipidemia 02/24/2017   Pre-diabetes 02/24/2017  Anxiety disorder 01/24/2017   Shortness of breath 01/13/2017   Costochondritis 01/13/2017   Family history of premature CAD 01/13/2017   GERD (gastroesophageal reflux disease) 12/21/2016   Vitamin D deficiency, unspecified 12/21/2016   Iron deficiency anemia due to chronic blood loss 12/20/2016   Asthma in adult, unspecified asthma severity, uncomplicated 17/40/8144   Obesity, Class III, BMI 40-49.9 (morbid obesity) ([place]) 11/04/2016    Past Surgical  History:  Procedure Laterality Date   ANAL EXAMINATION UNDER ANESTHESIA  age 65   w/ removal cyst   CYSTOSCOPY N/A 04/09/2019   Procedure: CYSTOSCOPY;  Surgeon: [name], MD;  Location: [facility];  Service: Gynecology;  Laterality: N/A;   DILATATION & CURETTAGE/HYSTEROSCOPY WITH MYOSURE N/A 10/17/2017   Procedure: DILATATION & CURETTAGE/HYSTEROSCOPY WITH MYOSURE;  Surgeon: Anastasio Auerbach, MD;  Location: [facility];  Service: Gynecology;  Laterality: N/A;   DILATATION & CURETTAGE/HYSTEROSCOPY WITH MYOSURE N/A 04/11/2018   Procedure: DILATATION & CURETTAGE/HYSTEROSCOPY WITH MYOSURE;  Surgeon: Anastasio Auerbach, MD;  Location: [facility];  Service: Gynecology;  Laterality: N/A;   GYNECOLOGIC CRYOSURGERY  age 32s   TONSILLECTOMY  age 8   TOTAL LAPAROSCOPIC HYSTERECTOMY WITH SALPINGECTOMY Bilateral 04/09/2019   Procedure: TOTAL LAPAROSCOPIC HYSTERECTOMY WITH SALPINGECTOMY;  Surgeon: [name], MD;  Location: [facility];  Service: Gynecology;  Laterality: Bilateral;  Has Mirena IUD in place, 3 hours surgery time. Extended recovery bed.   TRANSTHORACIC ECHOCARDIOGRAM  01/17/2017   ef 60-65%/  mild TR     OB History     Gravida  0   Para  0   Term  0   Preterm  0   AB  0   Living  0      SAB  0   IAB  0   Ectopic  0   Multiple  0   Live Births  0           Family History  Problem Relation Age of Onset   Pulmonary embolism Mother        Last year   Mental retardation Mother        PTSD and fibromyalgia   Heart disease Mother        CHF   Congestive Heart Failure Mother    Heart disease Father 45       CAD   Hyperlipidemia Father    Diabetes Paternal Grandmother    Heart attack Paternal Grandmother    Congestive Heart Failure Maternal Aunt    Cancer Neg Hx     Social History   Tobacco Use   Smoking status: Never   Smokeless tobacco: Never  Vaping Use   Vaping Use: Never used  Substance Use Topics   Alcohol use:  Yes    Comment: occasionally   Drug use: No    Home Medications Prior to Admission medications   Medication Sig Start Date End Date Taking? Authorizing Provider  albuterol (VENTOLIN HFA) 108 (90 Base) MCG/ACT inhaler Inhale 2 puffs into the lungs every 4 (four) hours as needed. 12/19/19   Kennith Gain, MD  baclofen (LIORESAL) 10 MG tablet TAKE 1 TABLET BY MOUTH THREE TIMES DAILY AS NEEDED FOR MUSCLE SPASMS Patient taking differently: Take 5 mg by mouth 3 (three) times daily as needed for muscle spasms. 06/20/18   Jamse Arn, MD  benzonatate (TESSALON) 100 MG capsule Take 200 mg by mouth 3 (three) times daily as needed for cough.    [provider]  BREO ELLIPTA 200-25 MCG/INH AEPB INHALE ONE DOSE BY MOUTH DAILY 08/01/20   Kennith Gain, MD  cetirizine (ZYRTEC) 10 MG tablet Take 1 tablet (10 mg total) by mouth every morning. 12/19/19   Kennith Gain, MD  Cyanocobalamin (VITAMIN B 12 PO) Take 1,000 mg by mouth daily.     [provider]  DULoxetine (CYMBALTA) 60 MG capsule Take 1 capsule (60 mg total) by mouth daily. 02/16/21   Raulkar, Clide Deutscher, MD  ELIQUIS 5 MG TABS tablet TAKE ONE TABLET BY MOUTH TWICE A DAY 09/26/19   Truitt Merle, MD  fluticasone [facility]) 50 MCG/ACT nasal spray SHAKE LIQUID AND USE 1 SPRAY IN EACH NOSTRIL DAILY--  in am 12/19/19   Kennith Gain, MD  folic acid (FOLVITE) 1 MG tablet Take 2 mg by mouth daily.     [provider]  Golimumab ([place] ARIA IV) Inject into the vein.    [provider]  ipratropium (ATROVENT) 0.03 % nasal spray Place 2 sprays into both nostrils 3 (three) times daily as needed (nasal drainage, throat clearing). 12/19/19   Kennith Gain, MD  methocarbamol (ROBAXIN) 500 MG tablet Take 1 tablet (500 mg total) by mouth 2 (two) times daily as needed for muscle spasms. 02/08/18   Jamse Arn, MD  methotrexate 2.5 MG tablet Take 15 mg by mouth once a week.  Friday in the evening    [provider]  montelukast (SINGULAIR) 10 MG tablet TAKE ONE TABLET BY MOUTH EVERY NIGHT AT BEDTIME 01/26/21   Kennith Gain, MD  mupirocin nasal ointment (BACTROBAN) 2 % Place 1 application into the nose 2 (two) times daily for 10 days. 06/21/19 07/01/19  Kennith Gain, MD  omeprazole (PRILOSEC) 40 MG capsule TAKE 1 CAPSULE(40 MG) BY MOUTH DAILY 04/27/19   Martinique, Betty G, MD  oxybutynin (DITROPAN) 5 MG tablet Take 5 mg by mouth 2 (two) times daily.  12/28/16   [provider]  pregabalin (LYRICA) 75 MG capsule TAKE ONE CAPSULE BY MOUTH TWICE A DAY 12/25/20   Jamse Arn, MD  timolol (TIMOPTIC) 0.5 % ophthalmic solution Place 1 drop into both eyes daily.     [provider]  topiramate (TOPAMAX) 25 MG tablet Take 75 mg by mouth at bedtime.  05/26/17   [provider]  Vitamin D, Ergocalciferol, (DRISDOL) 1.25 MG (50000 UNIT) CAPS capsule TAKE 1 CAPSULE BY MOUTH EVERY 10 DAYS 11/20/19   Martinique, Betty G, MD    Allergies    Other and Darvon [propoxyphene]  Review of Systems   Review of Systems  Constitutional:  Negative for chills and fever.  HENT:  Negative for ear pain and sore throat.   Eyes:  Negative for pain and visual disturbance.  Respiratory:  Negative for cough and shortness of breath.   Cardiovascular:  Negative for chest pain and palpitations.  Gastrointestinal:  Positive for abdominal pain and nausea. Negative for vomiting.  Genitourinary:  Positive for dysuria, frequency and urgency. Negative for hematuria, vaginal bleeding, vaginal discharge and vaginal pain.       Intermittent urinary incontinence, chronic  Musculoskeletal:  Positive for back pain. Negative for arthralgias, gait problem and myalgias.  Skin:  Negative for color change and rash.  Neurological:  Negative for seizures and syncope.  All other systems reviewed and are negative.  Physical Exam Updated Vital Signs BP 100/82    Pulse 79   Temp 98.5 F (36.9 C) (Oral)  Resp 18   Ht 5\' 10"  (1.778 m)   Wt (!) 155.1 kg   LMP 03/05/2019   SpO2 100%   BMI 49.07 kg/m   Physical Exam Vitals and nursing note reviewed.  Constitutional:      General: She is not in acute distress.    Appearance: Normal appearance. She is not toxic-appearing.  HENT:     Head: Normocephalic and atraumatic.  Eyes:     General: No scleral icterus. Cardiovascular:     Rate and Rhythm: Normal rate and regular rhythm.  Pulmonary:     Effort: Pulmonary effort is normal.     Breath sounds: Normal breath sounds.  Abdominal:     General: Bowel sounds are normal.     Palpations: Abdomen is soft.     Tenderness: There is abdominal tenderness in the right upper quadrant and right lower quadrant. There is no right CVA tenderness, left CVA tenderness, guarding or rebound.     Comments: Exam limited secondary to patient body habitus.  Mild tenderness to right upper and lower quadrants.  No overlying skin changes, rashes, erythema, ecchymosis noted to the area.  Normoactive bowel sounds.  No guarding or rebound.  No peritoneal signs.  No CVA tenderness bilaterally.  Musculoskeletal:        General: No deformity.     Cervical back: Normal range of motion.     Comments: Moderate tenderness to palpation over bilateral SI joints left greater than right.  No weakness.  DP pulses intact.  Sensation intact.  Skin:    General: Skin is warm and dry.     Findings: No rash.  Neurological:     General: No focal deficit present.     Mental Status: She is alert. Mental status is at baseline.     Motor: No weakness.    ED Results / Procedures / Treatments   Labs (all labs ordered are listed, but only abnormal results are displayed) Labs Reviewed  URINALYSIS, ROUTINE W REFLEX MICROSCOPIC    EKG None  Radiology CT Renal Stone Study  Result Date: 02/21/2021 CLINICAL DATA:  Left flank pain since Monday No history of stones EXAM: CT ABDOMEN AND  PELVIS WITHOUT CONTRAST TECHNIQUE: Multidetector CT imaging of the abdomen and pelvis was performed following the standard protocol without IV contrast. COMPARISON:  None. FINDINGS: Lower chest: No acute abnormality. Evaluation of the abdominal viscera limited by the lack of IV contrast. Hepatobiliary: No focal liver abnormality is seen. Normal appearance of the gallbladder. Pancreas: Unremarkable. No surrounding inflammatory changes. Spleen: Normal in size without focal abnormality. Adrenals/Urinary Tract: Adrenal glands are unremarkable. Kidneys are normal, without renal calculi, focal lesion, or hydronephrosis. Bladder is unremarkable. Stomach/Bowel: Stomach is within normal limits. Appendix appears normal. No evidence of bowel wall thickening, distention, or inflammatory changes. Vascular/Lymphatic: Vascular patency cannot be assessed in the absence of IV contrast. No lymphadenopathy. Reproductive: Status post hysterectomy. No adnexal masses. Other: No abdominal wall hernia or abnormality. No abdominopelvic ascites. Musculoskeletal: No acute or significant osseous findings. IMPRESSION: No acute intra-abdominal pathology on a noncontrast CT. Electronically Signed   By: Audie Pinto M.D.   On: 02/21/2021 14:41    Procedures Procedures   Medications Ordered in ED Medications  acetaminophen (TYLENOL) tablet 1,000 mg (1,000 mg Oral Given 02/21/21 1438)    ED Course  I have reviewed the triage vital signs and the nursing notes.  Pertinent labs & imaging results that were available during my care of the patient were reviewed  by me and considered in my medical decision making (see chart for details).  Clinical Course as of 02/21/21 1455  Sat Feb 21, 2021  1400 This is a 45 year old female with a history of chronic pain, obesity, bladder spasms, presented emergency department with back pain.  She reports gradual onset of pain about 4 days ago, does not recall any inciting events.  She is describing  cramping pain and stabbing pain in her lower back bilaterally, left side more than the right, also involving her left flank.  She reports she constantly has issues of urinary urgency.  He had a televisit with the doctor and prescribed Macrobid for possible UTI, which she has taken with no relief.  She is also been taking Tylenol.  She cannot have NSAIDs because she is on Eliquis for PE.  She reports that most other narcotics and muscle relaxers make her extremely drowsy and she cannot work while taking them.  She has tried Neurontin in the past and says that even 100 mg make her too drowsy to work.  She does have baclofen and Robaxin at home which she uses at night, and has not used any recently.  On exam she is afebrile.  She appears uncomfortable sitting upright and holding her back.  She does have bilateral paraspinal muscle tenderness and CVA tenderness.  She is exquisitely tender with palpation.  Her UA is unremarkable without sign of infection.  We will obtain a renal stone study while she is here and give additional tylenol.  Unfortunately her pain control options are quite limited at home, but I do recommend that she continue baclofen or muscle relaxers as much she is able to tolerate, especially at night. [MT]  7106 Renal study unremarkable - anticipate discharge [MT]    Clinical Course User Index [MT] Trifan, Carola Rhine, MD   MDM Rules/Calculators/A&P                          Holly Taneeka Curtner presents to the emergency department for evaluation of low back pain.  Differential diagnosis includes but is not limited to cauda equina, sciatica, MSK pain, renal stone, UTI, pyelonephritis, and/or hydronephritis.   She is tender over bilateral SI joints left greater than right.  Abdominal exam limited secondary to body habitus.  Patient is unable to take NSAIDs due to being on Eliquis for PEs.  She reports sensitivity to other medications, and is driving home today.  We will give her Tylenol for  pain.  I personally reviewed and interpreted the patient's labs and imaging.  Urinalysis benign.  No leukocytes, nitrites, or blood noticed.  CT renal stone study shows no stone, or acute abdominal abnormality.  On exam, she is no weakness to her bilateral lower extremities.  Sensation intact.  Pulses intact.  Low suspicion for cauda equina.  The pain is nonradiating, low suspicion for sciatica.  Benign urine, low suspicion for pyelonephritis or hydronephrosis.  I discussed lab and imaging findings with patient.  Recommended Tylenol as pain relief given her Eliquis use.  Recommended follow-up with her primary care doctor.  Return precautions given.  Patient agrees to plan.  Patient is stable be discharged home in good condition.  I discussed this case with my attending physician who cosigned this note including patient's presenting symptoms, physical exam, and planned diagnostics and interventions. Attending physician stated agreement with plan or made changes to plan which were implemented.   Attending physician assessed patient at bedside.  Final Clinical Impression(s) / ED Diagnoses Final diagnoses:  Acute bilateral low back pain without sciatica    Rx / DC Orders ED Discharge Orders     None        Sherrell Puller, PA-C 02/21/21 1500    Wyvonnia Dusky, MD 02/22/21 [phone]

## 2021-02-24 ENCOUNTER — Other Ambulatory Visit: Payer: Self-pay

## 2021-02-24 ENCOUNTER — Encounter (HOSPITAL_BASED_OUTPATIENT_CLINIC_OR_DEPARTMENT_OTHER): Payer: Self-pay

## 2021-02-24 ENCOUNTER — Emergency Department (HOSPITAL_BASED_OUTPATIENT_CLINIC_OR_DEPARTMENT_OTHER)
Admission: EM | Admit: 2021-02-24 | Discharge: 2021-02-24 | Disposition: A | Discharge status: Home / Self Care | Payer: 59 | Attending: Emergency Medicine | Admitting: Emergency Medicine

## 2021-02-24 DIAGNOSIS — R21 Rash and other nonspecific skin eruption: Secondary | ICD-10-CM | POA: Diagnosis present

## 2021-02-24 DIAGNOSIS — B029 Zoster without complications: Secondary | ICD-10-CM | POA: Diagnosis not present

## 2021-02-24 MED ORDER — VALACYCLOVIR HCL 1 G PO TABS: 1000.0000 mg | ORAL_TABLET | Freq: Three times a day (TID) | ORAL | 0 refills | Status: DC

## 2021-02-24 NOTE — ED Triage Notes (Signed)
Pt came in with c/o shingles L side. Started one week ago. States it is on R side as well under pannus.

## 2021-02-24 NOTE — ED Provider Notes (Signed)
[facility] EMERGENCY DEPT Provider Note   CSN: [id number] Arrival date & time: 02/24/21  2035     History Chief Complaint  Patient presents with   Herpes Zoster    Holly Mcdonald is a 45 y.o. female.  HPI     45yo female with history below presents with concern for rash.  Rreports it began one week ago, blisters over back on the left and left abdomen.  Reports pain associated with it but difficult to quantify as she lives with pain secondary to her mixed connective tissue disease, fibromyalgia. No other acute symptoms.   Past Medical History:  Diagnosis Date   Arthritis    inflammatory   ASCUS with positive high risk HPV cervical 01/2019   Bladder spasm    Fibromyalgia    GERD (gastroesophageal reflux disease)    History of chronic bronchitis    last flare up 11-18-17   History of iron deficiency anemia    History of pulmonary embolism 10/2016--- followed by pcp and pulmoloigst   dx bilateral PE , bilateral lower lobes , segmental , unprovoked, negative hypercoagulable work-up-- treated w/ eliquis   Migraine    Mild intermittent asthma    pulmologist-  dr Elsworth Soho   Mixed connective tissue disease ([place])    Morbid obesity with BMI of 45.0-49.9, adult ([place])    Open-angle glaucoma of both eyes    Pre-diabetes    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   bilateral ---- stopped eliquis   Sleep apnea    Vitamin D deficiency     Patient Active Problem List   Diagnosis Date Noted   H/O: hysterectomy 09/28/2020   High risk HPV infection 09/28/2020   Abnormality of gait 12/25/2019   Morbid obesity ([place]) 06/26/2019   OSA (obstructive sleep apnea) 03/01/2019   Daytime sleepiness 01/26/2019   Sleep disorder 12/14/2018   Fibromyalgia 08/16/2018   Autoimmune disease ([place]) 03/24/2018   History of pulmonary embolus (PE) 03/24/2018   B12 deficiency 02/15/2018   Connective tissue disease ([place]) 02/08/2018   Inflammatory arthritis 02/08/2018   Chronic pain syndrome  12/16/2017   Incontinence of urine in female 09/16/2017   Glaucoma, open angle, mild stage 07/27/2017   Polyarthralgia 07/06/2017   Retinal hemorrhage noted on examination of both eyes 06/30/2017   Allergic rhinitis 03/07/2017   Hyperlipidemia 02/24/2017   Pre-diabetes 02/24/2017   Anxiety disorder 01/24/2017   Shortness of breath 01/13/2017   Costochondritis 01/13/2017   Family history of premature CAD 01/13/2017   GERD (gastroesophageal reflux disease) 12/21/2016   Vitamin D deficiency, unspecified 12/21/2016   Iron deficiency anemia due to chronic blood loss 12/20/2016   Asthma in adult, unspecified asthma severity, uncomplicated 96/75/9163   Obesity, Class III, BMI 40-49.9 (morbid obesity) ([place]) 11/04/2016    Past Surgical History:  Procedure Laterality Date   ANAL EXAMINATION UNDER ANESTHESIA  age 16   w/ removal cyst   CYSTOSCOPY N/A 04/09/2019   Procedure: CYSTOSCOPY;  Surgeon: [name], MD;  Location: [facility];  Service: Gynecology;  Laterality: N/A;   DILATATION & CURETTAGE/HYSTEROSCOPY WITH MYOSURE N/A 10/17/2017   Procedure: DILATATION & CURETTAGE/HYSTEROSCOPY WITH MYOSURE;  Surgeon: Anastasio Auerbach, MD;  Location: [facility];  Service: Gynecology;  Laterality: N/A;   DILATATION & CURETTAGE/HYSTEROSCOPY WITH MYOSURE N/A 04/11/2018   Procedure: DILATATION & CURETTAGE/HYSTEROSCOPY WITH MYOSURE;  Surgeon: Anastasio Auerbach, MD;  Location: [facility];  Service: Gynecology;  Laterality: N/A;   GYNECOLOGIC CRYOSURGERY  age 33s  TONSILLECTOMY  age 13   TOTAL LAPAROSCOPIC HYSTERECTOMY WITH SALPINGECTOMY Bilateral 04/09/2019   Procedure: TOTAL LAPAROSCOPIC HYSTERECTOMY WITH SALPINGECTOMY;  Surgeon: [name], MD;  Location: [facility];  Service: Gynecology;  Laterality: Bilateral;  Has Mirena IUD in place, 3 hours surgery time. Extended recovery bed.   TRANSTHORACIC ECHOCARDIOGRAM  01/17/2017   ef 60-65%/  mild TR     OB History      Gravida  0   Para  0   Term  0   Preterm  0   AB  0   Living  0      SAB  0   IAB  0   Ectopic  0   Multiple  0   Live Births  0           Family History  Problem Relation Age of Onset   Pulmonary embolism Mother        Last year   Mental retardation Mother        PTSD and fibromyalgia   Heart disease Mother        CHF   Congestive Heart Failure Mother    Heart disease Father 66       CAD   Hyperlipidemia Father    Diabetes Paternal Grandmother    Heart attack Paternal Grandmother    Congestive Heart Failure Maternal Aunt    Cancer Neg Hx     Social History   Tobacco Use   Smoking status: Never   Smokeless tobacco: Never  Vaping Use   Vaping Use: Never used  Substance Use Topics   Alcohol use: Yes    Comment: occasionally   Drug use: No    Home Medications Prior to Admission medications   Medication Sig Start Date End Date Taking? Authorizing Provider  albuterol (VENTOLIN HFA) 108 (90 Base) MCG/ACT inhaler Inhale 2 puffs into the lungs every 4 (four) hours as needed. 12/19/19   Kennith Gain, MD  baclofen (LIORESAL) 10 MG tablet TAKE 1 TABLET BY MOUTH THREE TIMES DAILY AS NEEDED FOR MUSCLE SPASMS Patient taking differently: Take 5 mg by mouth 3 (three) times daily as needed for muscle spasms. 06/20/18   Jamse Arn, MD  benzonatate (TESSALON) 100 MG capsule Take 200 mg by mouth 3 (three) times daily as needed for cough.    [provider]  Adair Patter [phone] MCG/INH AEPB INHALE ONE DOSE BY MOUTH DAILY 08/01/20   Kennith Gain, MD  cetirizine (ZYRTEC) 10 MG tablet Take 1 tablet (10 mg total) by mouth every morning. 12/19/19   Kennith Gain, MD  Cyanocobalamin (VITAMIN B 12 PO) Take 1,000 mg by mouth daily.     [provider]  DULoxetine (CYMBALTA) 60 MG capsule Take 1 capsule (60 mg total) by mouth daily. 02/16/21   Raulkar, Clide Deutscher, MD  ELIQUIS 5 MG TABS tablet TAKE ONE TABLET BY MOUTH  TWICE A DAY 09/26/19   Truitt Merle, MD  fluticasone [facility]) 50 MCG/ACT nasal spray SHAKE LIQUID AND USE 1 SPRAY IN EACH NOSTRIL DAILY--  in am 12/19/19   Kennith Gain, MD  Fluticasone-Salmeterol (ADVAIR HFA IN) 1 puff    [provider]  folic acid (FOLVITE) 1 MG tablet Take 2 mg by mouth daily.     [provider]  Golimumab ([place] ARIA IV) Inject into the vein.    [provider]  ipratropium (ATROVENT) 0.03 % nasal spray Place 2 sprays into both nostrils 3 (three)  times daily as needed (nasal drainage, throat clearing). 12/19/19   Kennith Gain, MD  lidocaine (XYLOCAINE) 5 % ointment Apply 1 application topically as needed. 02/21/21   Sherrell Puller, PA-C  methocarbamol (ROBAXIN) 500 MG tablet Take 1 tablet (500 mg total) by mouth 2 (two) times daily as needed for muscle spasms. 02/08/18   Jamse Arn, MD  methotrexate 2.5 MG tablet Take 15 mg by mouth once a week. Friday in the evening    [provider]  montelukast (SINGULAIR) 10 MG tablet TAKE ONE TABLET BY MOUTH EVERY NIGHT AT BEDTIME 01/26/21   Kennith Gain, MD  mupirocin nasal ointment (BACTROBAN) 2 % Place 1 application into the nose 2 (two) times daily for 10 days. 06/21/19 07/01/19  Kennith Gain, MD  omeprazole (PRILOSEC) 40 MG capsule TAKE 1 CAPSULE(40 MG) BY MOUTH DAILY 04/27/19   Martinique, Betty G, MD  oxybutynin (DITROPAN) 5 MG tablet Take 5 mg by mouth 2 (two) times daily.  12/28/16   [provider]  pregabalin (LYRICA) 75 MG capsule Take by mouth. 01/12/18   [provider]  timolol (TIMOPTIC) 0.5 % ophthalmic solution Place 1 drop into both eyes daily.     [provider]  topiramate (TOPAMAX) 25 MG tablet Take 75 mg by mouth at bedtime.  05/26/17   [provider]  valACYclovir (VALTREX) 1000 MG tablet Take 1 tablet (1,000 mg total) by mouth 3 (three) times daily. 02/24/21   Gareth Morgan, MD  Vitamin D,  Ergocalciferol, (DRISDOL) 1.25 MG (50000 UNIT) CAPS capsule TAKE 1 CAPSULE BY MOUTH EVERY 10 DAYS 11/20/19   Martinique, Betty G, MD    Allergies    Other and Darvon [propoxyphene]  Review of Systems   Review of Systems  Constitutional:  Negative for fever.  Respiratory:  Negative for shortness of breath.   Cardiovascular:  Negative for chest pain.  Gastrointestinal:  Negative for nausea and vomiting.  Musculoskeletal:  Positive for arthralgias (chronic).  Skin:  Positive for rash.   Physical Exam Updated Vital Signs BP (!) 136/92   Pulse (!) 111   Temp 98.6 F (37 C) (Oral)   Resp 18   Ht 5\' 10"  (1.778 m)   Wt (!) 154.2 kg   LMP 03/05/2019   SpO2 95%   BMI 48.78 kg/m   Physical Exam Vitals and nursing note reviewed.  Constitutional:      General: She is not in acute distress.    Appearance: Normal appearance. She is not ill-appearing, toxic-appearing or diaphoretic.  HENT:     Head: Normocephalic.  Eyes:     Conjunctiva/sclera: Conjunctivae normal.  Cardiovascular:     Rate and Rhythm: Normal rate and regular rhythm.     Pulses: Normal pulses.  Pulmonary:     Effort: Pulmonary effort is normal. No respiratory distress.  Musculoskeletal:        General: No deformity or signs of injury.     Cervical back: No rigidity.  Skin:    General: Skin is warm and dry.     Coloration: Skin is not jaundiced or pale.     Findings: Rash (grouped vesicles, papules left back and abdomen) present.  Neurological:     General: No focal deficit present.     Mental Status: She is alert and oriented to person, place, and time.    ED Results / Procedures / Treatments   Labs (all labs ordered are listed, but only abnormal results are displayed) Labs Reviewed -  No data to display  EKG None  Radiology No results found.  Procedures Procedures   Medications Ordered in ED Medications - No data to display  ED Course  I have reviewed the triage vital signs and the nursing  notes.  Pertinent labs & imaging results that were available during my care of the patient were reviewed by me and considered in my medical decision making (see chart for details).    MDM Rules/Calculators/A&P                           45yo female with history below presents with concern for rash.  Rash does not have the appearance of SSS, TEN, erythroderma, scabies, RMSF or hives.   THe rash is consistent with shingles, no sign of other complications or bacterial infection.  Given rx for valacyclovir given new lesions.  Has pain medication and on steroids currently for flare. Patient discharged in stable condition with understanding of reasons to return.    Final Clinical Impression(s) / ED Diagnoses Final diagnoses:  Herpes zoster without complication    Rx / DC Orders ED Discharge Orders          Ordered    valACYclovir (VALTREX) 1000 MG tablet  3 times daily,   Status:  Discontinued        02/24/21 2226    valACYclovir (VALTREX) 1000 MG tablet  3 times daily        02/24/21 2227             Gareth Morgan, MD 02/25/21 1043

## 2021-02-25 ENCOUNTER — Other Ambulatory Visit: Payer: Self-pay | Admitting: Allergy

## 2021-02-26 ENCOUNTER — Telehealth: Payer: Self-pay

## 2021-02-26 ENCOUNTER — Encounter: Payer: 59 | Admitting: Physical Medicine and Rehabilitation

## 2021-02-26 NOTE — Telephone Encounter (Signed)
Patient called stating she was diagnosed with shingles on 02/24/21. She states she is having pain from them and would like something for the pain that will not interact with her blood thinners. She rescheduled her appointment due to the shingles and is rescheduled for 03/20/21 with Dr. Ranell Patrick. Please advise.

## 2021-03-03 ENCOUNTER — Other Ambulatory Visit: Payer: Self-pay

## 2021-03-03 ENCOUNTER — Encounter: Payer: 59 | Attending: Physical Medicine and Rehabilitation | Admitting: Physical Medicine and Rehabilitation

## 2021-03-03 DIAGNOSIS — G894 Chronic pain syndrome: Secondary | ICD-10-CM

## 2021-03-03 MED ORDER — BACLOFEN 10 MG PO TABS: 10.0000 mg | ORAL_TABLET | Freq: Three times a day (TID) | ORAL | 5 refills | Status: DC | PRN

## 2021-03-03 MED ORDER — PREGABALIN 75 MG PO CAPS: 75.0000 mg | ORAL_CAPSULE | Freq: Three times a day (TID) | ORAL | 3 refills | Status: DC | PRN

## 2021-03-03 MED ORDER — NORTRIPTYLINE HCL 10 MG PO CAPS: 10.0000 mg | ORAL_CAPSULE | Freq: Every day | ORAL | 3 refills | Status: DC | PRN

## 2021-03-03 NOTE — Progress Notes (Addendum)
Subjective:    Patient ID: Holly Mcdonald, female    DOB: [birth year], 45 y.o.   MRN: [id number]  HPI An audio/video tele-health visit is felt to be the most appropriate encounter for this patient at this time. . This is a follow up tele-visit via phone. The patient is at home. MD is at office.    Female with past medical history of tremors, pulmonary embolism, morbid obesity, migraines, fibromyalgia, prediabetes, asthma, fibroids, chronic bronchitis presents with fibromyalgia.  Initially stated: Started 10/2016 after diagnosed PE.  Progressively getting worse.  Heat improved the pain.  Fibroids have exacerbated the pain, with surgery planned for 10/17/17.  Activity exacerbates the pain.  All qualities of pain.  Non-radiating.  Constant.  Associated muscle spasms and tingling.  Tylenol does not help.  Cymbalta provides some benefit. Denies falls. Pain limits all activities.   Works as Therapist, art.    Last clinic visit 12/24/2020. Patient states she is on the tail end of a flare up after dropping 1L bottle on her toe. She forgets that she has a TENS.  She is laying down under her electric blanket. She is using Lidocaine patch, Baclofen rarely.  She is taking Lyrica BID, TID during flares.  Denies falls. She states she is gaining weight.  She follows up with headache specialist.  She is seeing Rheum and receives infusion.   Needs muscle relaxers refilled.   He had shingles recently and pain has now improved  She was on Amitriptyline as a child.   Asks if there is anything else she can take if she has a flare in [place]  Pain Inventory Average Pain 5 Pain Right Now 3 My pain is constant and aching  In the last 24 hours, has pain interfered with the following? General activity 6 Relation with others 0 Enjoyment of life 0 What TIME of day is your pain at its worst?  [place], daytime, evening & night Sleep (in general) Good  Pain is worse with: walking, bending, sitting,  inactivity, standing and some activites Pain improves with: rest, medication and CBD Gummies & Heat Relief from Meds: 4    Family History  Problem Relation Age of Onset   Pulmonary embolism Mother        Last year   Mental retardation Mother        PTSD and fibromyalgia   Heart disease Mother        CHF   Congestive Heart Failure Mother    Heart disease Father 65       CAD   Hyperlipidemia Father    Diabetes Paternal Grandmother    Heart attack Paternal Grandmother    Congestive Heart Failure Maternal Aunt    Cancer Neg Hx    Social History   Socioeconomic History   Marital status: Divorced    Spouse name: Not on file   Number of children: Not on file   Years of education: Not on file   Highest education level: Not on file  Occupational History   Occupation: representative    Comment: [employer]  Tobacco Use   Smoking status: Never   Smokeless tobacco: Never  Vaping Use   Vaping Use: Never used  Substance and Sexual Activity   Alcohol use: Yes    Comment: occasionally   Drug use: No   Sexual activity: Not Currently    Birth control/protection: None    Comment: 1st intercourse 45 yo-More than 5 partners Mirena 09/16/2017  Other Topics Concern  Not on file  Social History Narrative   Not on file   Social Determinants of Health   Financial Resource Strain: Not on file  Food Insecurity: Not on file  Transportation Needs: Not on file  Physical Activity: Not on file  Stress: Not on file  Social Connections: Not on file   Past Surgical History:  Procedure Laterality Date   ANAL EXAMINATION UNDER ANESTHESIA  age 37   w/ removal cyst   CYSTOSCOPY N/A 04/09/2019   Procedure: CYSTOSCOPY;  Surgeon: [name], MD;  Location: [facility];  Service: Gynecology;  Laterality: N/A;   DILATATION & CURETTAGE/HYSTEROSCOPY WITH MYOSURE N/A 10/17/2017   Procedure: DILATATION & CURETTAGE/HYSTEROSCOPY WITH MYOSURE;  Surgeon: Anastasio Auerbach, MD;  Location: [facility];  Service: Gynecology;  Laterality: N/A;   DILATATION & CURETTAGE/HYSTEROSCOPY WITH MYOSURE N/A 04/11/2018   Procedure: DILATATION & CURETTAGE/HYSTEROSCOPY WITH MYOSURE;  Surgeon: Anastasio Auerbach, MD;  Location: [facility];  Service: Gynecology;  Laterality: N/A;   GYNECOLOGIC CRYOSURGERY  age 12s   TONSILLECTOMY  age 52   TOTAL LAPAROSCOPIC HYSTERECTOMY WITH SALPINGECTOMY Bilateral 04/09/2019   Procedure: TOTAL LAPAROSCOPIC HYSTERECTOMY WITH SALPINGECTOMY;  Surgeon: [name], MD;  Location: [facility];  Service: Gynecology;  Laterality: Bilateral;  Has Mirena IUD in place, 3 hours surgery time. Extended recovery bed.   TRANSTHORACIC ECHOCARDIOGRAM  01/17/2017   ef 60-65%/  mild TR   Past Medical History:  Diagnosis Date   Arthritis    inflammatory   ASCUS with positive high risk HPV cervical 01/2019   Bladder spasm    Fibromyalgia    GERD (gastroesophageal reflux disease)    History of chronic bronchitis    last flare up 11-18-17   History of iron deficiency anemia    History of pulmonary embolism 10/2016--- followed by pcp and pulmoloigst   dx bilateral PE , bilateral lower lobes , segmental , unprovoked, negative hypercoagulable work-up-- treated w/ eliquis   Migraine    Mild intermittent asthma    pulmologist-  dr Elsworth Soho   Mixed connective tissue disease ([place])    Morbid obesity with BMI of 45.0-49.9, adult ([place])    Open-angle glaucoma of both eyes    Pre-diabetes    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   bilateral ---- stopped eliquis   Sleep apnea    Vitamin D deficiency    LMP 03/05/2019   Opioid Risk Score:   Fall Risk Score:  `1  Depression screen PHQ 2/9  Depression screen [facility] 2/9 06/09/2020 12/25/2019 05/17/2018 01/12/2018 10/07/2017  Decreased Interest 0 0 0 0 0  Down, Depressed, Hopeless 0 0 0 0 0  PHQ - 2 Score 0 0 0 0 0  Altered sleeping - - - - 1  Tired, decreased energy - - - - 3  Change in appetite - - - - 3  Feeling bad or failure about  yourself  - - - - 0  Trouble concentrating - - - - 0  Moving slowly or fidgety/restless - - - - 0  Suicidal thoughts - - - - 0  PHQ-9 Score - - - - 7  Difficult doing work/chores - - - - Somewhat difficult  Some recent data might be hidden   Review of Systems  Constitutional: Negative.   HENT: Negative.    Eyes: Negative.   Respiratory:  Positive for apnea.   Cardiovascular: Negative.   Gastrointestinal: Negative.   Endocrine: Negative.   Genitourinary:  Positive for difficulty  urinating.       Bladder control  Musculoskeletal:  Positive for arthralgias, back pain, myalgias and neck pain.  Skin: Negative.   Allergic/Immunologic: Negative.   Neurological:  Positive for tremors.       Tingling  Hematological: Negative.   Psychiatric/Behavioral:  The patient is nervous/anxious.   All other systems reviewed and are negative.    Objective:   Physical Exam Seen via phone    Assessment & Plan:  Female with past medical history of tremors, pulmonary embolism, morbid obesity, migraines, fibromyalgia, prediabetes, asthma, fibroids, chronic bronchitis presents with pain all over.    1. Chronic diffuse pain             Multifactorial - Fibromyalgia, connective tissue disorder, inflammatory arthritis, fibroids s/p hysterectomy             Side effects with Gabapentin             Neck xray reviewed, showing mild disc space narrowing at C7-T1.             Continue Heat             Encouraged pool therapy (completed aquatic therapy), reminded again after [facility].             Cont TENS IT with benefit, reminded             Continue Lidoderm OTC             Refilled Lyrica 75 BID - TID, usually taking twice daily             Refilled Baclofen 10 qhs, unable to tolerate during the day             D/ced Robaxin 500 BID PRN             Cont to follow up with Psychology   Continue 60mg              Will consider accupuncture if necessary             Will consider NSAID if  necessary  Prescribed Pamelor 10mg  daily prn for breakthrough pain               2. Gait abnormality             Continue slow transitional movements   3. Sleep disturbance with OSA             Improved with meds             See #1  6 minutes spent in discussion of her pain, the medications she finds helpful, providing refills, providing a medication for breakthrough pain while she is in [place]   4. Morbid Obesity             Encouraged weight loss again   5. Myalgia                     Will consider trigger point injections when able to tolerate             See #1  Not needed at present   6. Migraines             Cont Topamax             See #1.     7. Connective tissue disorder with inflammatory arthritis             Continue to be worked up by [employer]  See #1             Plaquenil DC'd, infusion started             Continue Methotrexate per Rheum  5 minutes spent in discussing her current pain medications and her response to them, her upcoming trip to [facility] and trying Pamelor if she should experience breakthrough pain while there, providing refills she required

## 2021-03-04 NOTE — Addendum Note (Signed)
Addended by: Izora Ribas on: 03/04/2021 12:10 PM   Modules accepted: Level of Service

## 2021-03-15 ENCOUNTER — Other Ambulatory Visit: Payer: Self-pay | Admitting: Physical Medicine and Rehabilitation

## 2021-03-20 ENCOUNTER — Encounter: Payer: Self-pay | Admitting: Physical Medicine and Rehabilitation

## 2021-03-20 ENCOUNTER — Encounter: Payer: 59 | Attending: Physical Medicine and Rehabilitation | Admitting: Physical Medicine and Rehabilitation

## 2021-03-20 ENCOUNTER — Other Ambulatory Visit: Payer: Self-pay

## 2021-03-20 VITALS — BP 123/77 | HR 84 | Ht 70.0 in | Wt 342.2 lb

## 2021-03-20 DIAGNOSIS — M797 Fibromyalgia: Secondary | ICD-10-CM | POA: Diagnosis present

## 2021-03-20 DIAGNOSIS — R251 Tremor, unspecified: Secondary | ICD-10-CM | POA: Diagnosis present

## 2021-03-20 NOTE — Patient Instructions (Signed)
Foods to alleviate migraine:  1) dark leafy greens 2) avocado 3) tuna 4) samon and mackerel 5) beans and legumes Supplements that can be helpful: feverfew, B12, and magnesium  Foods to avoid in migraine: 1) Excessive (or irregular timing) coffee 2) red wine 3) aged cheeses 4) chocolate 5) citrus fruits 6) aspartame and other artifical sweeteners 7) yeast 8) MSG (in processed foods) 9) processed and cured meats 10) nuts and certain seeds 11) chicken livers and other organ meats 12) dairy products like buttermilk, sour cream, and yogurt 13) dried fruits like dates, figs, and raisins 14) garlic 15) onions 16) potato chips 17) pickled foods like olives and sauerkraut 18) some fresh fruits like ripe banana, papaya, red plums, raspberries, kiwi, pineapple 19) tomato-based products  Recommend to keep a migraine diary: rate daily the severity of your headache (1-10) and what foods you eat that day to help determine patterns.     -Discussed following foods that may reduce pain: 1) Ginger (especially studied for arthritis)- reduce leukotriene production to decrease inflammation 2) Blueberries- high in phytonutrients that decrease inflammation 3) Salmon- marine omega-3s reduce joint swelling and pain 4) Pumpkin seeds- reduce inflammation 5) dark chocolate- reduces inflammation 6) turmeric- reduces inflammation 7) tart cherries - reduce pain and stiffness 8) extra virgin olive oil - its compound olecanthal helps to block prostaglandins  9) chili peppers- can be eaten or applied topically via capsaicin 10) mint- helpful for headache, muscle aches, joint pain, and itching 11) garlic- reduces inflammation  Link to further information on diet for chronic pain: [email or web address]

## 2021-03-20 NOTE — Progress Notes (Addendum)
Subjective:    Patient ID: Holly Mcdonald, female    DOB: Jan 06, 1976, 45 y.o.   MRN: [id number]  HPI   Holly Mcdonald is a 45 year old woman who presents for follow-up of tremors, pulmonary embolism, morbid obesity, migraines, fibromyalgia, prediabetes, asthma, fibroids, chronic bronchitis presents with fibromyalgia.  Initially stated: Started 10/2016 after diagnosed PE.  Progressively getting worse.  Heat improved the pain.  Fibroids have exacerbated the pain, with surgery planned for 10/17/17.  Activity exacerbates the pain.  All qualities of pain.  Non-radiating.  Constant.  Associated muscle spasms and tingling.  Tylenol does not help.  Cymbalta provides some benefit. Denies falls. Pain limits all activities.   Works as Therapist, art.    She recently went on a trip to [place]. She needed wheelchair assistance on the way home but had a great trip.  She got her infusion for her RA this morning and feeling better.   She has been doing so much better since she has seen rheumatology. She has some resistance getting there as her initial labs were not positive for RA. 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   She follows with Dr. Jenetta Downer who originally treated her with plaquenil and methotrexate.   For a long time she had resisted going to pain management.   She forgets that she has a TENS.  She is laying down under her electric blanket. She is using Lidocaine patch, Baclofen rarely.  She is taking Lyrica BID, TID during flares.  Denies falls. She states she is gaining weight.  She follows up with headache specialist.  She is seeing Rheum and receives infusion.   Needs muscle relaxers refilled.   He had shingles recently and pain has now improved  She was on Amitriptyline as a child.   She has left sided hand tremors. She was told to take B12.   Pain Inventory Average Pain 2 Pain Right Now 3 My pain is dull and aching  In the last 24 hours, has pain interfered  with the following? General activity 4 Relation with others 0 Enjoyment of life 0 What TIME of day is your pain at its worst? morning Sleep (in general) Good  Pain is worse with: walking, bending, sitting, inactivity, standing and some activites Pain improves with: rest, medication and CBD Gummies & Heat Relief from Meds: 9    Family History  Problem Relation Age of Onset   Pulmonary embolism Mother        Last year   Mental retardation Mother        PTSD and fibromyalgia   Heart disease Mother        CHF   Congestive Heart Failure Mother    Heart disease Father 12       CAD   Hyperlipidemia Father    Diabetes Paternal Grandmother    Heart attack Paternal Grandmother    Congestive Heart Failure Maternal Aunt    Cancer Neg Hx    Social History   Socioeconomic History   Marital status: Divorced    Spouse name: Not on file   Number of children: Not on file   Years of education: Not on file   Highest education level: Not on file  Occupational History   Occupation: representative    Comment: [employer]  Tobacco Use   Smoking status: Never   Smokeless tobacco: Never  Vaping Use   Vaping Use: Never used  Substance and Sexual Activity   Alcohol use:  Yes    Comment: occasionally   Drug use: No   Sexual activity: Not Currently    Birth control/protection: None    Comment: 1st intercourse 45 yo-More than 5 partners Mirena 09/16/2017  Other Topics Concern   Not on file  Social History Narrative   Not on file   Social Determinants of Health   Financial Resource Strain: Not on file  Food Insecurity: Not on file  Transportation Needs: Not on file  Physical Activity: Not on file  Stress: Not on file  Social Connections: Not on file   Past Surgical History:  Procedure Laterality Date   ANAL EXAMINATION UNDER ANESTHESIA  age 49   w/ removal cyst   CYSTOSCOPY N/A 04/09/2019   Procedure: CYSTOSCOPY;  Surgeon: [name], MD;  Location: [facility];  Service: Gynecology;   Laterality: N/A;   DILATATION & CURETTAGE/HYSTEROSCOPY WITH MYOSURE N/A 10/17/2017   Procedure: DILATATION & CURETTAGE/HYSTEROSCOPY WITH MYOSURE;  Surgeon: Anastasio Auerbach, MD;  Location: [facility];  Service: Gynecology;  Laterality: N/A;   DILATATION & CURETTAGE/HYSTEROSCOPY WITH MYOSURE N/A 04/11/2018   Procedure: DILATATION & CURETTAGE/HYSTEROSCOPY WITH MYOSURE;  Surgeon: Anastasio Auerbach, MD;  Location: [facility];  Service: Gynecology;  Laterality: N/A;   GYNECOLOGIC CRYOSURGERY  age 55s   TONSILLECTOMY  age 30   TOTAL LAPAROSCOPIC HYSTERECTOMY WITH SALPINGECTOMY Bilateral 04/09/2019   Procedure: TOTAL LAPAROSCOPIC HYSTERECTOMY WITH SALPINGECTOMY;  Surgeon: [name], MD;  Location: [facility];  Service: Gynecology;  Laterality: Bilateral;  Has Mirena IUD in place, 3 hours surgery time. Extended recovery bed.   TRANSTHORACIC ECHOCARDIOGRAM  01/17/2017   ef 60-65%/  mild TR   Past Medical History:  Diagnosis Date   Arthritis    inflammatory   ASCUS with positive high risk HPV cervical 01/2019   Bladder spasm    Fibromyalgia    GERD (gastroesophageal reflux disease)    History of chronic bronchitis    last flare up 11-18-17   History of iron deficiency anemia    History of pulmonary embolism 10/2016--- followed by pcp and pulmoloigst   dx bilateral PE , bilateral lower lobes , segmental , unprovoked, negative hypercoagulable work-up-- treated w/ eliquis   Migraine    Mild intermittent asthma    pulmologist-  dr Elsworth Soho   Mixed connective tissue disease ([place])    Morbid obesity with BMI of 45.0-49.9, adult ([place])    Open-angle glaucoma of both eyes    Pre-diabetes    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   bilateral ---- stopped eliquis   Sleep apnea    Vitamin D deficiency    BP 123/77   Pulse 84   Ht 5\' 10"  (1.778 m)   Wt (!) 342 lb 3.2 oz (155.2 kg)   LMP 03/05/2019   SpO2 96%   BMI 49.10 kg/m   Opioid Risk Score:   Fall Risk Score:   `1  Depression screen PHQ 2/9  Depression screen [facility] 2/9 06/09/2020 12/25/2019 05/17/2018 01/12/2018 10/07/2017  Decreased Interest 0 0 0 0 0  Down, Depressed, Hopeless 0 0 0 0 0  PHQ - 2 Score 0 0 0 0 0  Altered sleeping - - - - 1  Tired, decreased energy - - - - 3  Change in appetite - - - - 3  Feeling bad or failure about yourself  - - - - 0  Trouble concentrating - - - - 0  Moving slowly or fidgety/restless - - - - 0  Suicidal thoughts - - - - 0  PHQ-9 Score - - - - 7  Difficult doing work/chores - - - - Somewhat difficult  Some recent data might be hidden   Review of Systems  Constitutional: Negative.   HENT: Negative.    Eyes: Negative.   Respiratory:  Positive for apnea.   Cardiovascular: Negative.   Gastrointestinal: Negative.   Endocrine: Negative.   Genitourinary:  Positive for difficulty urinating.       Bladder control  Musculoskeletal:  Positive for arthralgias, back pain, myalgias and neck pain.  Skin: Negative.   Allergic/Immunologic: Negative.   Neurological:  Positive for tremors.       Tingling  Hematological: Negative.   Psychiatric/Behavioral:  The patient is nervous/anxious.   All other systems reviewed and are negative.    Objective:   Physical Exam Gen: no distress, normal appearing HEENT: oral mucosa pink and moist, NCAT Cardio: Reg rate Chest: normal effort, normal rate of breathing Abd: soft, non-distended Ext: no edema Psych: pleasant, normal affect Skin: intact Neuro: Alert an oriented x3. Left sided resting tremor.     Assessment & Plan:  Female with past medical history of tremors, pulmonary embolism, morbid obesity, migraines, fibromyalgia, prediabetes, asthma, fibroids, chronic bronchitis presents with pain all over.    1. Chronic diffuse pain             Multifactorial - Fibromyalgia, connective tissue disorder, inflammatory arthritis, fibroids s/p hysterectomy             Side effects with Gabapentin             Neck xray reviewed,  showing mild disc space narrowing at C7-T1.             Continue Heat             Encouraged pool therapy (completed aquatic therapy), reminded again after CoVid.             Cont TENS IT with benefit, reminded             Continue Lidoderm OTC             Refilled Lyrica 75 BID - TID, usually taking twice daily             Refilled Baclofen 10 qhs, unable to tolerate during the day             D/ced Robaxin 500 BID PRN             Cont to follow up with Psychology   Continue 60mg              Will consider accupuncture if necessary             Will consider NSAID if necessary  Prescribed Pamelor 10mg  daily prn for breakthrough pain -Discussed benefits of exercise in reducing pain. -Discussed following foods that may reduce pain: 1) Ginger (especially studied for arthritis)- reduce leukotriene production to decrease inflammation 2) Blueberries- high in phytonutrients that decrease inflammation 3) Salmon- marine omega-3s reduce joint swelling and pain 4) Pumpkin seeds- reduce inflammation 5) dark chocolate- reduces inflammation 6) turmeric- reduces inflammation 7) tart cherries - reduce pain and stiffness 8) extra virgin olive oil - its compound olecanthal helps to block prostaglandins  9) chili peppers- can be eaten or applied topically via capsaicin 10) mint- helpful for headache, muscle aches, joint pain, and itching 11) garlic- reduces inflammation  Link to further information on diet for chronic pain: [email or web address]  2. Gait abnormality             Continue slow transitional movements   3. Sleep disturbance with OSA             Improved with meds             See #1   4. Morbid Obesity             Encouraged weight loss again   5. Myalgia                     Will consider trigger point injections when able to tolerate             See #1  Not needed at present   6. Migraines              Continue Topamax             See #1.   Foods to alleviate migraine:  1) dark leafy greens 2) avocado 3) tuna 4) samon and mackerel 5) beans and legumes Supplements that can be helpful: feverfew, B12, and magnesium  Foods to avoid in migraine: 1) Excessive (or irregular timing) coffee 2) red wine 3) aged cheeses 4) chocolate 5) citrus fruits 6) aspartame and other artifical sweeteners 7) yeast 8) MSG (in processed foods) 9) processed and cured meats 10) nuts and certain seeds 11) chicken livers and other organ meats 12) dairy products like buttermilk, sour cream, and yogurt 13) dried fruits like dates, figs, and raisins 14) garlic 15) onions 16) potato chips 17) pickled foods like olives and sauerkraut 18) some fresh fruits like ripe banana, papaya, red plums, raspberries, kiwi, pineapple 19) tomato-based products  Recommend to keep a migraine diary: rate daily the severity of your headache (1-10) and what foods you eat that day to help determine patterns.     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 Tremor: Discussed propanolol but we decided not severe enough now to consider.

## 2021-04-27 ENCOUNTER — Other Ambulatory Visit: Payer: Self-pay | Admitting: Physical Medicine & Rehabilitation

## 2021-04-27 ENCOUNTER — Other Ambulatory Visit: Payer: Self-pay | Admitting: Allergy

## 2021-04-27 DIAGNOSIS — J3089 Other allergic rhinitis: Secondary | ICD-10-CM

## 2021-05-29 ENCOUNTER — Telehealth: Payer: Self-pay | Admitting: Allergy

## 2021-05-29 DIAGNOSIS — J4530 Mild persistent asthma, uncomplicated: Secondary | ICD-10-CM

## 2021-05-29 MED ORDER — ALBUTEROL SULFATE HFA 108 (90 BASE) MCG/ACT IN AERS: 2.0000 | INHALATION_SPRAY | RESPIRATORY_TRACT | 1 refills | Status: DC | PRN

## 2021-05-29 NOTE — Telephone Encounter (Signed)
Nadine Counts called in and states she needs Ventolin refills sent to [facility] on [address].

## 2021-05-29 NOTE — Telephone Encounter (Signed)
Called the number provided  however it stated that call could not be completed at this time. I tried calling 2 more times and received a busy signal, will try again later.

## 2021-05-29 NOTE — Telephone Encounter (Signed)
Called patient and advised of inhaler being sent in. Patient verbalized understanding.

## 2021-05-29 NOTE — Telephone Encounter (Signed)
Refills have been sent to the requested pharmacy.  

## 2021-06-11 ENCOUNTER — Other Ambulatory Visit: Payer: Self-pay

## 2021-06-11 DIAGNOSIS — J4530 Mild persistent asthma, uncomplicated: Secondary | ICD-10-CM

## 2021-06-11 MED ORDER — ALBUTEROL SULFATE HFA 108 (90 BASE) MCG/ACT IN AERS: 2.0000 | INHALATION_SPRAY | RESPIRATORY_TRACT | 1 refills | Status: DC | PRN

## 2021-06-11 NOTE — Telephone Encounter (Signed)
Courtesy refill sent to [facility].

## 2021-06-12 ENCOUNTER — Other Ambulatory Visit: Payer: Self-pay

## 2021-06-12 DIAGNOSIS — J4530 Mild persistent asthma, uncomplicated: Secondary | ICD-10-CM

## 2021-06-12 MED ORDER — ALBUTEROL SULFATE HFA 108 (90 BASE) MCG/ACT IN AERS: 2.0000 | INHALATION_SPRAY | RESPIRATORY_TRACT | 1 refills | Status: DC | PRN

## 2021-06-17 ENCOUNTER — Other Ambulatory Visit: Payer: Self-pay

## 2021-07-09 ENCOUNTER — Other Ambulatory Visit: Payer: Self-pay | Admitting: Physical Medicine and Rehabilitation

## 2021-08-01 NOTE — Progress Notes (Signed)
? ?Subjective:  ? ? Patient ID: Holly Mcdonald, female    DOB: 1975-08-20, 46 y.o.   MRN: [id number] ? ?HPI   ?Holly Mcdonald is a 46 year old woman who presents for follow-up of tremors, pulmonary embolism, morbid obesity, migraines, fibromyalgia, prediabetes, asthma, fibroids, chronic bronchitis presents with fibromyalgia.  ?Initially stated: Started 10/2016 after diagnosed PE.  Progressively getting worse.  Heat improved the pain.  Fibroids have exacerbated the pain, with surgery planned for 10/17/17.  Activity exacerbates the pain.  All qualities of pain.  Non-radiating.  Constant.  Associated muscle spasms and tingling.  Tylenol does not help.  Cymbalta provides some benefit. Denies falls. Pain limits all activities.  ? ?Works as Therapist, art.   ? ?She recently went on a trip to [place]. She needed wheelchair assistance on the way home but had a great trip. ? ?She got her infusion for her RA this morning and feeling better. Getting these 8 weeks.  ? ?She has been doing so much better since she has seen rheumatology. She has some resistance getting there as her initial labs were not positive for RA. 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  ? ?She follows with Dr. Jenetta Downer who originally treated her with plaquenil and methotrexate.  ? ?For a long time she had resisted going to pain management.  ? ?Doing well today.  ? ?She forgets that she has a TENS.  She has one in case she needs. She is laying down under her electric blanket. She is using Lidocaine patch, Baclofen rarely.  She is taking Lyrica BID, TID during flares.  Denies falls. She states she is gaining weight.  She follows up with headache specialist.  She is seeing Rheum and receives infusion.  ? ?Muscle relaxers only used ocassionally.  ? ?He had shingles recently and pain has now improved ? ?She was on Amitriptyline as a child.  ? ?She has left sided hand tremors. She was told to take B12.  ? ?Pain Inventory ?Average Pain 2 ?Pain  Right Now 2 ?My pain is aching ? ?In the last 24 hours, has pain interfered with the following? ?General activity 1 ?Relation with others 0 ?Enjoyment of life 0 ?What TIME of day is your pain at its worst? morning, daytime, evening, night ?Sleep (in general) Good ? ?Pain is worse with: walking, sitting, inactivity, standing, and some activites ?Pain improves with: rest, therapy/exercise, and medication ?Relief from Meds: 9 ? ? ? ?Family History  ?Problem Relation Age of Onset  ? Pulmonary embolism Mother   ?     Last year  ? Mental retardation Mother   ?     PTSD and fibromyalgia  ? Heart disease Mother   ?     CHF  ? Congestive Heart Failure Mother   ? Heart disease Father 57  ?     CAD  ? Hyperlipidemia Father   ? Diabetes Paternal Grandmother   ? Heart attack Paternal Grandmother   ? Congestive Heart Failure Maternal Aunt   ? Cancer Neg Hx   ? ?Social History  ? ?Socioeconomic History  ? Marital status: Divorced  ?  Spouse name: Not on file  ? Number of children: Not on file  ? Years of education: Not on file  ? Highest education level: Not on file  ?Occupational History  ? Occupation: representative  ?  Comment: [employer]  ?Tobacco Use  ? Smoking status: Never  ? Smokeless tobacco: Never  ?Vaping Use  ?  Vaping Use: Never used  ?Substance and Sexual Activity  ? Alcohol use: Yes  ?  Comment: occasionally  ? Drug use: No  ? Sexual activity: Not Currently  ?  Birth control/protection: None  ?  Comment: 1st intercourse 46 yo-More than 5 partners Mirena 09/16/2017  ?Other Topics Concern  ? Not on file  ?Social History Narrative  ? Not on file  ? ?Social Determinants of Health  ? ?Financial Resource Strain: Not on file  ?Food Insecurity: Not on file  ?Transportation Needs: Not on file  ?Physical Activity: Not on file  ?Stress: Not on file  ?Social Connections: Not on file  ? ?Past Surgical History:  ?Procedure Laterality Date  ? ANAL EXAMINATION UNDER ANESTHESIA  age 46  ? w/ removal cyst  ? CYSTOSCOPY N/A 04/09/2019  ?  Procedure: CYSTOSCOPY;  Surgeon: [name], MD;  Location: [facility];  Service: Gynecology;  Laterality: N/A;  ? DILATATION & CURETTAGE/HYSTEROSCOPY WITH MYOSURE N/A 10/17/2017  ? Procedure: [place];  Surgeon: Anastasio Auerbach, MD;  Location: [facility];  Service: Gynecology;  Laterality: N/A;  ? DILATATION & CURETTAGE/HYSTEROSCOPY WITH MYOSURE N/A 04/11/2018  ? Procedure: [place];  Surgeon: Anastasio Auerbach, MD;  Location: [facility];  Service: Gynecology;  Laterality: N/A;  ? GYNECOLOGIC CRYOSURGERY  age 46  ? TONSILLECTOMY  age 46  ? TOTAL LAPAROSCOPIC HYSTERECTOMY WITH SALPINGECTOMY Bilateral 04/09/2019  ? Procedure: TOTAL LAPAROSCOPIC HYSTERECTOMY WITH SALPINGECTOMY;  Surgeon: [name], MD;  Location: [facility];  Service: Gynecology;  Laterality: Bilateral;  Has Mirena IUD in place, 3 hours surgery time. Extended recovery bed.  ? TRANSTHORACIC ECHOCARDIOGRAM  01/17/2017  ? ef 60-65%/  mild TR  ? ?Past Medical History:  ?Diagnosis Date  ? Arthritis   ? inflammatory  ? ASCUS with positive high risk HPV cervical 01/2019  ? Bladder spasm   ? Fibromyalgia   ? GERD (gastroesophageal reflux disease)   ? History of chronic bronchitis   ? last flare up 11-18-17  ? History of iron deficiency anemia   ? History of pulmonary embolism 10/2016--- followed by pcp and pulmoloigst  ? dx bilateral PE , bilateral lower lobes , segmental , unprovoked, negative hypercoagulable work-up-- treated w/ eliquis  ? Migraine   ? Mild intermittent asthma   ? pulmologist-  dr Elsworth Soho  ? Mixed connective tissue disease ([place])   ? Morbid obesity with BMI of 45.0-49.9, adult ([place])   ? Open-angle glaucoma of both eyes   ? Pre-diabetes   ? 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  ? bilateral ---- stopped eliquis  ? Sleep apnea   ? Vitamin D deficiency   ? ?LMP 03/05/2019  ? ?Opioid Risk Score:   ?Fall Risk Score:  `1 ? ?Depression screen PHQ  2/9 ? ? ?  06/09/2020  ?  8:14 AM 12/25/2019  ?  8:50 AM 05/17/2018  ? 10:40 AM 01/12/2018  ? 10:06 AM 10/07/2017  ? 10:28 AM  ?Depression screen PHQ 2/9  ?Decreased Interest 0 0 0 0 0  ?Down, Depressed, Hopeless 0 0 0 0 0  ?PHQ - 2 Score 0 0 0 0 0  ?Altered sleeping     1  ?Tired, decreased energy     3  ?Change in appetite     3  ?Feeling bad or failure about yourself      0  ?Trouble concentrating     0  ?Moving slowly or fidgety/restless  0  ?Suicidal thoughts     0  ?PHQ-9 Score     7  ?Difficult doing work/chores     Somewhat difficult  ? ?Review of Systems  ?Constitutional: Negative.   ?HENT: Negative.    ?Eyes: Negative.   ?Respiratory:  Positive for apnea.   ?Cardiovascular: Negative.   ?Gastrointestinal: Negative.   ?Endocrine: Negative.   ?Genitourinary:  Positive for difficulty urinating.  ?     Bladder control  ?Musculoskeletal:  Positive for arthralgias, back pain, myalgias and neck pain.  ?Skin: Negative.   ?Allergic/Immunologic: Negative.   ?Neurological:  Positive for tremors.  ?     Tingling  ?Hematological: Negative.   ?Psychiatric/Behavioral:  The patient is nervous/anxious.   ?All other systems reviewed and are negative. ?   ?Objective:  ? Physical Exam ?Gen: no distress, normal appearing, BMI 48.41, weight 337 lbs ?HEENT: oral mucosa pink and moist, NCAT ?Cardio: Reg rate ?Chest: normal effort, normal rate of breathing ?Abd: soft, non-distended ?Ext: no edema ?Psych: pleasant, normal affect ?Skin: intact ?Neuro: Alert an oriented x3. Left sided resting tremor.  ?   ?Assessment & Plan:  ?Female with past medical history of tremors, pulmonary embolism, morbid obesity, migraines, fibromyalgia, prediabetes, asthma, fibroids, chronic bronchitis presents with pain all over.  ?  ?1. Chronic diffuse pain ?            Multifactorial - Fibromyalgia, connective tissue disorder, inflammatory arthritis, fibroids s/p hysterectomy ?            Side effects with Gabapentin ?            Neck xray reviewed, showing  mild disc space narrowing at C7-T1. ?            Continue Heat ?            Encouraged pool therapy (completed aquatic therapy), reminded again after CoVid. ?            Cont TENS IT with benefit, reminded ?

## 2021-08-04 ENCOUNTER — Other Ambulatory Visit: Payer: Self-pay

## 2021-08-04 ENCOUNTER — Encounter: Payer: 59 | Attending: Physical Medicine and Rehabilitation | Admitting: Physical Medicine and Rehabilitation

## 2021-08-04 ENCOUNTER — Encounter: Payer: Self-pay | Admitting: Physical Medicine and Rehabilitation

## 2021-08-04 VITALS — BP 106/74 | HR 84 | Ht 70.0 in | Wt 337.4 lb

## 2021-08-04 DIAGNOSIS — M797 Fibromyalgia: Secondary | ICD-10-CM | POA: Diagnosis not present

## 2021-08-04 DIAGNOSIS — E559 Vitamin D deficiency, unspecified: Secondary | ICD-10-CM | POA: Diagnosis not present

## 2021-08-04 NOTE — Patient Instructions (Signed)
Infrared lamp ?

## 2021-10-02 ENCOUNTER — Other Ambulatory Visit (HOSPITAL_COMMUNITY)
Admission: RE | Admit: 2021-10-02 | Discharge: 2021-10-02 | Disposition: A | Discharge status: Home / Self Care | Payer: 59 | Source: Ambulatory Visit | Attending: Obstetrics & Gynecology | Admitting: Obstetrics & Gynecology

## 2021-10-02 ENCOUNTER — Encounter (HOSPITAL_BASED_OUTPATIENT_CLINIC_OR_DEPARTMENT_OTHER): Payer: Self-pay | Admitting: Obstetrics & Gynecology

## 2021-10-02 ENCOUNTER — Ambulatory Visit (INDEPENDENT_AMBULATORY_CARE_PROVIDER_SITE_OTHER): Payer: 59 | Admitting: Obstetrics & Gynecology

## 2021-10-02 VITALS — BP 118/89 | HR 90 | Ht 70.0 in | Wt 339.0 lb

## 2021-10-02 DIAGNOSIS — B977 Papillomavirus as the cause of diseases classified elsewhere: Secondary | ICD-10-CM

## 2021-10-02 DIAGNOSIS — Z01419 Encounter for gynecological examination (general) (routine) without abnormal findings: Secondary | ICD-10-CM

## 2021-10-02 DIAGNOSIS — Z8619 Personal history of other infectious and parasitic diseases: Secondary | ICD-10-CM | POA: Insufficient documentation

## 2021-10-02 DIAGNOSIS — Z113 Encounter for screening for infections with a predominantly sexual mode of transmission: Secondary | ICD-10-CM | POA: Insufficient documentation

## 2021-10-02 DIAGNOSIS — Z86711 Personal history of pulmonary embolism: Secondary | ICD-10-CM

## 2021-10-02 DIAGNOSIS — Z1211 Encounter for screening for malignant neoplasm of colon: Secondary | ICD-10-CM

## 2021-10-02 DIAGNOSIS — Z124 Encounter for screening for malignant neoplasm of cervix: Secondary | ICD-10-CM | POA: Insufficient documentation

## 2021-10-02 DIAGNOSIS — D500 Iron deficiency anemia secondary to blood loss (chronic): Secondary | ICD-10-CM

## 2021-10-02 NOTE — Progress Notes (Signed)
46 y.o. G0P0000 Divorced [place] American female here for annual exam.  Doing well.  Denies vaginal bleeding.  H/o high risk HPV positive testing prior to surgery.    Patient's last menstrual period was 03/05/2019.          Sexually active: Not currently The current method of family planning is status post hysterectomy.    Smoker:  no  Health Maintenance: Pap:  neg with neg HR HPV 2023 History of abnormal Pap:  +HR HPV prior to surgery MMG:  03/2021 Colonoscopy:  guidelines reviewed Screening Labs: does with rheumatology and PCP   reports that she has never smoked. She has never used smokeless tobacco. She reports current alcohol use. She reports that she does not use drugs.  Past Medical History:  Diagnosis Date   Arthritis    inflammatory   ASCUS with positive high risk HPV cervical 01/2019   Bladder spasm    Fibromyalgia    GERD (gastroesophageal reflux disease)    History of chronic bronchitis    last flare up 11-18-17   History of iron deficiency anemia    History of pulmonary embolism 10/2016--- followed by pcp and pulmoloigst   dx bilateral PE , bilateral lower lobes , segmental , unprovoked, negative hypercoagulable work-up-- treated w/ eliquis   Migraine    Mild intermittent asthma    pulmologist-  dr Elsworth Soho   Mixed connective tissue disease ([place])    Morbid obesity with BMI of 45.0-49.9, adult ([place])    Open-angle glaucoma of both eyes    Pre-diabetes    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   bilateral ---- stopped eliquis   Sleep apnea    Vitamin D deficiency     Past Surgical History:  Procedure Laterality Date   ANAL EXAMINATION UNDER ANESTHESIA  age 61   w/ removal cyst   CYSTOSCOPY N/A 04/09/2019   Procedure: CYSTOSCOPY;  Surgeon: [name], MD;  Location: [facility];  Service: Gynecology;  Laterality: N/A;   DILATATION & CURETTAGE/HYSTEROSCOPY WITH MYOSURE N/A 10/17/2017   Procedure: DILATATION & CURETTAGE/HYSTEROSCOPY WITH MYOSURE;  Surgeon: Anastasio Auerbach, MD;  Location: [facility];  Service: Gynecology;  Laterality: N/A;   DILATATION & CURETTAGE/HYSTEROSCOPY WITH MYOSURE N/A 04/11/2018   Procedure: DILATATION & CURETTAGE/HYSTEROSCOPY WITH MYOSURE;  Surgeon: Anastasio Auerbach, MD;  Location: [facility];  Service: Gynecology;  Laterality: N/A;   GYNECOLOGIC CRYOSURGERY  age 18s   TONSILLECTOMY  age 86   TOTAL LAPAROSCOPIC HYSTERECTOMY WITH SALPINGECTOMY Bilateral 04/09/2019   Procedure: TOTAL LAPAROSCOPIC HYSTERECTOMY WITH SALPINGECTOMY;  Surgeon: [name], MD;  Location: [facility];  Service: Gynecology;  Laterality: Bilateral;  Has Mirena IUD in place, 3 hours surgery time. Extended recovery bed.   TRANSTHORACIC ECHOCARDIOGRAM  01/17/2017   ef 60-65%/  mild TR    Current Outpatient Medications  Medication Sig Dispense Refill   albuterol (VENTOLIN HFA) 108 (90 Base) MCG/ACT inhaler Inhale 2 puffs into the lungs every 4 (four) hours as needed for wheezing or shortness of breath. 18 g 1   baclofen (LIORESAL) 10 MG tablet Take 1 tablet (10 mg total) by mouth 3 (three) times daily as needed. for muscle spams 90 tablet 5   benzonatate (TESSALON) 100 MG capsule Take 200 mg by mouth 3 (three) times daily as needed for cough.     BREO ELLIPTA 200-25 MCG/INH AEPB INHALE ONE DOSE BY MOUTH DAILY 60 each 5   cetirizine (ZYRTEC) 10 MG tablet Take 1 tablet (  10 mg total) by mouth every morning. 30 tablet 5   Cyanocobalamin (VITAMIN B 12 PO) Take 1,000 mg by mouth daily.      DULoxetine (CYMBALTA) 60 MG capsule TAKE ONE CAPSULE BY MOUTH DAILY 30 capsule 2   ELIQUIS 5 MG TABS tablet TAKE ONE TABLET BY MOUTH TWICE A DAY 60 tablet 2   fluticasone (FLONASE) 50 MCG/ACT nasal spray SHAKE LIQUID AND USE 1 SPRAY IN EACH NOSTRIL DAILY--  in am 16 g 5   Fluticasone-Salmeterol (ADVAIR HFA IN) 1 puff     folic acid (FOLVITE) 1 MG tablet Take 2 mg by mouth daily.      Golimumab ([place] ARIA IV) Inject into the vein.      ipratropium (ATROVENT) 0.03 % nasal spray Place 2 sprays into both nostrils 3 (three) times daily as needed (nasal drainage, throat clearing). 30 mL 5   lidocaine (XYLOCAINE) 5 % ointment Apply 1 application topically as needed. 35.44 g 0   methocarbamol (ROBAXIN) 500 MG tablet Take 1 tablet (500 mg total) by mouth 2 (two) times daily as needed for muscle spasms. 60 tablet 1   methotrexate 2.5 MG tablet Take 15 mg by mouth once a week. Friday in the evening     montelukast (SINGULAIR) 10 MG tablet TAKE ONE TABLET BY MOUTH EVERY NIGHT AT BEDTIME 30 tablet 5   mupirocin ointment (BACTROBAN) 2 % PLACE 1 APPLICATION INTO THE NOSE TWO TIMES A DAY FOR 10 DAYS 22 g 0   nortriptyline (PAMELOR) 10 MG capsule TAKE ONE CAPSULE BY MOUTH DAILY AS NEEDED FOR SLEEP 30 capsule 3   omeprazole (PRILOSEC) 40 MG capsule TAKE 1 CAPSULE(40 MG) BY MOUTH DAILY 90 capsule 2   oxybutynin (DITROPAN) 5 MG tablet Take 5 mg by mouth 2 (two) times daily.   11   pregabalin (LYRICA) 75 MG capsule TAKE ONE CAPSULE BY MOUTH TWICE A DAY 60 capsule 2   timolol (TIMOPTIC) 0.5 % ophthalmic solution Place 1 drop into both eyes daily.      topiramate (TOPAMAX) 25 MG tablet Take 75 mg by mouth at bedtime.   1   valACYclovir (VALTREX) 1000 MG tablet Take 1 tablet (1,000 mg total) by mouth 3 (three) times daily. 21 tablet 0   Vitamin D, Ergocalciferol, (DRISDOL) 1.25 MG (50000 UNIT) CAPS capsule TAKE 1 CAPSULE BY MOUTH EVERY 10 DAYS (Patient not taking: Reported on 10/02/2021) 3 capsule 4   No current facility-administered medications for this visit.    Family History  Problem Relation Age of Onset   Pulmonary embolism Mother        Last year   Mental retardation Mother        PTSD and fibromyalgia   Heart disease Mother        CHF   Congestive Heart Failure Mother    Heart disease Father 26       CAD   Hyperlipidemia Father    Diabetes Paternal Grandmother    Heart attack Paternal Grandmother    Congestive Heart Failure  Maternal Aunt    Cancer Neg Hx    ROS: A comprehensive review of systems was negative.  Exam:   BP 118/89 (BP Location: Left Arm, Patient Position: Sitting, Cuff Size: Large)   Pulse 90   Ht '5\' 10"'$  (1.778 m)   Wt (!) 339 lb (153.8 kg)   LMP 03/05/2019   BMI 48.64 kg/m   Height: '5\' 10"'$  (177.8 cm)  General appearance: alert, cooperative and appears stated  age Head: Normocephalic, without obvious abnormality, atraumatic Neck: no adenopathy, supple, symmetrical, trachea midline and thyroid normal to inspection and palpation Lungs: clear to auscultation bilaterally Breasts: normal appearance, no masses or tenderness Heart: regular rate and rhythm Abdomen: soft, non-tender; bowel sounds normal; no masses,  no organomegaly Extremities: extremities normal, atraumatic, no cyanosis or edema Skin: Skin color, texture, turgor normal. No rashes or lesions Lymph nodes: Cervical, supraclavicular, and axillary nodes normal. No abnormal inguinal nodes palpated Neurologic: Grossly normal   Pelvic: External genitalia:  no lesions              Urethra:  normal appearing urethra with no masses, tenderness or lesions              Bartholins and Skenes: normal                 Vagina: normal appearing vagina with normal color and no discharge, no lesions              Cervix: absent              Pap taken: No. Bimanual Exam:  Uterus:  uterus absent              Adnexa: no mass, fullness, tenderness               Rectovaginal: Confirms               Anus:  normal sphincter tone, no lesions  Chaperone, [place] Staling, CMA, was present for exam.  Assessment/Plan: 1. Well woman exam with routine gynecological exam - pap and HR HPV obtained today - will call for copy of MMG - colonoscopy referral placed - vaccines reviewed/updated  2. High risk HPV infection - Cytology - PAP( [place])  3. Cervical cancer screening  4. Screening examination for STD (sexually transmitted disease) -  Cytology - PAP( [place]) - RPR+HBsAg+HIV - Hepatitis C antibody  5. Colon cancer screening - Ambulatory referral to Gastroenterology  6. Iron deficiency anemia due to chronic blood loss - resolved after surgery  7. History of pulmonary embolus (PE) - on eliquis

## 2021-10-03 LAB — RPR+HBSAG+HIV
HIV Screen 4th Generation wRfx: NONREACTIVE
Hepatitis B Surface Ag: NEGATIVE
RPR Ser Ql: NONREACTIVE

## 2021-10-03 LAB — HEPATITIS C ANTIBODY: Hep C Virus Ab: NONREACTIVE

## 2021-10-08 LAB — CYTOLOGY - PAP
Chlamydia: NEGATIVE
Comment: NEGATIVE
Comment: NEGATIVE
Comment: NORMAL
Diagnosis: NEGATIVE
High risk HPV: NEGATIVE
Neisseria Gonorrhea: NEGATIVE

## 2021-10-29 ENCOUNTER — Ambulatory Visit (INDEPENDENT_AMBULATORY_CARE_PROVIDER_SITE_OTHER): Payer: 59 | Admitting: Allergy

## 2021-10-29 ENCOUNTER — Encounter: Payer: Self-pay | Admitting: Allergy

## 2021-10-29 VITALS — BP 110/72 | HR 97 | Temp 97.3°F | Resp 16 | Ht 70.0 in | Wt 340.2 lb

## 2021-10-29 DIAGNOSIS — G43009 Migraine without aura, not intractable, without status migrainosus: Secondary | ICD-10-CM

## 2021-10-29 DIAGNOSIS — J4530 Mild persistent asthma, uncomplicated: Secondary | ICD-10-CM | POA: Diagnosis not present

## 2021-10-29 DIAGNOSIS — J3089 Other allergic rhinitis: Secondary | ICD-10-CM | POA: Diagnosis not present

## 2021-10-29 DIAGNOSIS — H1013 Acute atopic conjunctivitis, bilateral: Secondary | ICD-10-CM | POA: Diagnosis not present

## 2021-10-29 NOTE — Patient Instructions (Addendum)
Allergic rhinitis with conjunctivitis and migraines     - continue avoidance measures for tree pollen, molds, dust mites and cockroach.     - continue use of Cetirizine '10mg'$  daily    - continue Singulair '10mg'$  daily at night    - continue using fluticasone 1-2 sprays each nostril daily as neededfor nasal congestion at this time    - for nasal drainage/throat clearing use nasal Ipratropium 2 sprays each nostril up to 3 times a day as needed.       - use nasal saline spray to help keep nose clean and moisturized    - consider allergen immunotherapy (allergy shots) if medication management becomes ineffective in controlling allergy symptoms.     - for nasal sores use Mupirocin ointment apply to the nose with q-tip twice a day for 10 days at a time.    Asthma, adult-onset    - under good control at this time    - lung function testing remained stable    - continue Breo 200 mcg 1 puff daily     - have access to Ventolin inhaler 2 puffs every 4-6 hours as needed for cough/wheeze/shortness of breath/chest tightness.  May use 15-20 minutes prior to activity.   Monitor frequency of use.    Asthma control goals:  Full participation in all desired activities (may need albuterol before activity) Albuterol use two time or less a week on average (not counting use with activity) Cough interfering with sleep two time or less a month Oral steroids no more than once a year No hospitalizations   Follow-up 6 months or sooner if needed

## 2021-10-29 NOTE — Progress Notes (Signed)
Follow-up Note  RE: Holly Mcdonald MRN: [id number] DOB: May 24, 1975 Date of Office Visit: 10/29/2021   History of present illness: Holly Mcdonald is a 46 y.o. female presenting today for follow-up of allergic rhinitis with conjunctivitis and asthma.  She also has a history of migraines.  She was last seen in office on 01/29/2021 by myself.  She states she has been quite stable since the last visit.   In regards to her allergies she has been doing well this spring and has not noted any significant increase in allergy symptoms but does unique spring season over the past.  She states as long as she keeps on her cetirizine and Singulair daily this controls her allergy and migraine symptoms.  She has not had any significant nasal symptoms to warrant use of either Flonase or nasal ipratropium.  She states the nasal sores in her nose finally did clear out.  She still has some mupirocin at home in case they do come back. In regards to her asthma the sting has been doing well.  She denies any need for albuterol use since the last visit.  She has not had any ED or urgent care visits related to her breathing.  She continues on Breo daily use for her maintenance.  Review of systems in the past 4 weeks: Review of Systems  Constitutional: Negative.   HENT: Negative.    Eyes: Negative.   Respiratory: Negative.    Cardiovascular: Negative.   Gastrointestinal: Negative.   Musculoskeletal: Negative.   Skin: Negative.   Allergic/Immunologic: Negative.   Neurological: Negative.      All other systems negative unless noted above in HPI  Past medical/social/surgical/family history have been reviewed and are unchanged unless specifically indicated below.  No changes  Medication List: Current Outpatient Medications  Medication Sig Dispense Refill   albuterol (VENTOLIN HFA) 108 (90 Base) MCG/ACT inhaler Inhale 2 puffs into the lungs every 4 (four) hours as needed for wheezing or  shortness of breath. 18 g 1   baclofen (LIORESAL) 10 MG tablet Take 1 tablet (10 mg total) by mouth 3 (three) times daily as needed. for muscle spams 90 tablet 5   benzonatate (TESSALON) 100 MG capsule Take 200 mg by mouth 3 (three) times daily as needed for cough.     BREO ELLIPTA 200-25 MCG/INH AEPB INHALE ONE DOSE BY MOUTH DAILY 60 each 5   cetirizine (ZYRTEC) 10 MG tablet Take 1 tablet (10 mg total) by mouth every morning. 30 tablet 5   Cyanocobalamin (VITAMIN B 12 PO) Take 1,000 mg by mouth daily.      DULoxetine (CYMBALTA) 60 MG capsule TAKE ONE CAPSULE BY MOUTH DAILY 30 capsule 2   ELIQUIS 5 MG TABS tablet TAKE ONE TABLET BY MOUTH TWICE A DAY 60 tablet 2   fluticasone (FLONASE) 50 MCG/ACT nasal spray SHAKE LIQUID AND USE 1 SPRAY IN EACH NOSTRIL DAILY--  in am 16 g 5   folic acid (FOLVITE) 1 MG tablet Take 2 mg by mouth daily.      Golimumab ([place] ARIA IV) Inject into the vein.     ipratropium (ATROVENT) 0.03 % nasal spray Place 2 sprays into both nostrils 3 (three) times daily as needed (nasal drainage, throat clearing). 30 mL 5   lidocaine (XYLOCAINE) 5 % ointment Apply 1 application topically as needed. 35.44 g 0   methocarbamol (ROBAXIN) 500 MG tablet Take 1 tablet (500 mg total) by mouth 2 (two) times daily as needed for  muscle spasms. 60 tablet 1   methotrexate 2.5 MG tablet Take 15 mg by mouth once a week. Friday in the evening     montelukast (SINGULAIR) 10 MG tablet TAKE ONE TABLET BY MOUTH EVERY NIGHT AT BEDTIME 30 tablet 5   mupirocin ointment (BACTROBAN) 2 % PLACE 1 APPLICATION INTO THE NOSE TWO TIMES A DAY FOR 10 DAYS 22 g 0   nortriptyline (PAMELOR) 10 MG capsule TAKE ONE CAPSULE BY MOUTH DAILY AS NEEDED FOR SLEEP 30 capsule 3   omeprazole (PRILOSEC) 40 MG capsule TAKE 1 CAPSULE(40 MG) BY MOUTH DAILY 90 capsule 2   oxybutynin (DITROPAN) 5 MG tablet Take 5 mg by mouth 2 (two) times daily.   11   pregabalin (LYRICA) 75 MG capsule TAKE ONE CAPSULE BY MOUTH TWICE A DAY 60  capsule 2   timolol (TIMOPTIC) 0.5 % ophthalmic solution Place 1 drop into both eyes daily.      topiramate (TOPAMAX) 25 MG tablet Take 75 mg by mouth at bedtime.   1   Vitamin D, Ergocalciferol, (DRISDOL) 1.25 MG (50000 UNIT) CAPS capsule TAKE 1 CAPSULE BY MOUTH EVERY 10 DAYS 3 capsule 4   No current facility-administered medications for this visit.     Known medication allergies: Allergies  Allergen Reactions   Other Nausea And Vomiting    Darvocet N-100 Other reaction(s): Vomiting   Darvon [Propoxyphene] Nausea And Vomiting     Physical examination: Blood pressure 110/72, pulse 97, temperature (!) 97.3 F (36.3 C), temperature source Temporal, resp. rate 16, height '5\' 10"'$  (1.778 m), weight (!) 340 lb 3.2 oz (154.3 kg), last menstrual period 03/05/2019, SpO2 97 %.  General: Alert, interactive, in no acute distress. HEENT: PERRLA, TMs pearly gray, turbinates non-edematous without discharge, post-pharynx non erythematous. Neck: Supple without lymphadenopathy. Lungs: Clear to auscultation without wheezing, rhonchi or rales. {no increased work of breathing. CV: Normal S1, S2 without murmurs. Abdomen: Nondistended, nontender. Skin: Warm and dry, without lesions or rashes. Extremities:  No clubbing, cyanosis or edema. Neuro:   Grossly intact.  Diagnositics/Labs:  Spirometry: FEV1: 2.23L 74%, FVC: 2.7L 73% predicted.  This is quite stable  Assessment and plan:   Allergic rhinitis with conjunctivitis and migraines     - continue avoidance measures for tree pollen, molds, dust mites and cockroach.     - continue use of Cetirizine '10mg'$  daily    - continue Singulair '10mg'$  daily at night    - continue using fluticasone 1-2 sprays each nostril daily as neededfor nasal congestion at this time    - for nasal drainage/throat clearing use nasal Ipratropium 2 sprays each nostril up to 3 times a day as needed.       - use nasal saline spray to help keep nose clean and moisturized    -  consider allergen immunotherapy (allergy shots) if medication management becomes ineffective in controlling allergy symptoms.     - for nasal sores use Mupirocin ointment apply to the nose with q-tip twice a day for 10 days at a time.    Asthma, adult-onset    - under good control at this time    - lung function testing remained stable    - continue Breo 200 mcg 1 puff daily     - have access to Ventolin inhaler 2 puffs every 4-6 hours as needed for cough/wheeze/shortness of breath/chest tightness.  May use 15-20 minutes prior to activity.   Monitor frequency of use.    Asthma control goals:  Full  participation in all desired activities (may need albuterol before activity) Albuterol use two time or less a week on average (not counting use with activity) Cough interfering with sleep two time or less a month Oral steroids no more than once a year No hospitalizations   Follow-up 6 months or sooner if needed I appreciate the opportunity to take part in Holly's care. Please do not hesitate to contact me with questions.  Sincerely,   Prudy Feeler, MD Allergy/Immunology [facility]

## 2021-11-04 ENCOUNTER — Other Ambulatory Visit: Payer: Self-pay | Admitting: Allergy

## 2021-11-04 DIAGNOSIS — J4530 Mild persistent asthma, uncomplicated: Secondary | ICD-10-CM

## 2021-11-19 ENCOUNTER — Encounter: Payer: Self-pay | Admitting: Gastroenterology

## 2021-11-30 ENCOUNTER — Other Ambulatory Visit: Payer: Self-pay | Admitting: Physical Medicine and Rehabilitation

## 2021-12-18 ENCOUNTER — Encounter: Payer: Self-pay | Admitting: Gastroenterology

## 2021-12-18 ENCOUNTER — Ambulatory Visit (INDEPENDENT_AMBULATORY_CARE_PROVIDER_SITE_OTHER): Payer: 59 | Admitting: Gastroenterology

## 2021-12-18 VITALS — BP 90/60 | HR 80 | Ht 70.0 in | Wt 343.0 lb

## 2021-12-18 DIAGNOSIS — Z8371 Family history of colonic polyps: Secondary | ICD-10-CM | POA: Diagnosis not present

## 2021-12-18 DIAGNOSIS — Z7902 Long term (current) use of antithrombotics/antiplatelets: Secondary | ICD-10-CM | POA: Diagnosis not present

## 2021-12-18 DIAGNOSIS — Z1211 Encounter for screening for malignant neoplasm of colon: Secondary | ICD-10-CM | POA: Diagnosis not present

## 2021-12-18 NOTE — Patient Instructions (Signed)
_______________________________________________________  If you are age 45 or older, your body mass index should be between 23-30. Your Body mass index is 49.22 kg/m. If this is out of the aforementioned range listed, please consider follow up with your Primary Care Provider.  If you are age 49 or younger, your body mass index should be between 19-25. Your Body mass index is 49.22 kg/m. If this is out of the aformentioned range listed, please consider follow up with your Primary Care Provider.   ________________________________________________________  The [facility] providers would like to encourage you to use [facility] to communicate with providers for non-urgent requests or questions.  Due to long hold times on the telephone, sending your provider a message by [facility] may be a faster and more efficient way to get a response.  Please allow 48 business hours for a response.  Please remember that this is for non-urgent requests.  _______________________________________________________   We will contact you once a hospital procedure date is available.  It was a pleasure to see you today!  Thank you for trusting me with your gastrointestinal care!

## 2021-12-18 NOTE — Progress Notes (Signed)
[facility] Consult Note:  History: Holly Mcdonald 12/18/2021  Referring provider: Janie Morning, DO  Reason for consult/chief complaint: Colon Cancer Screening and fam hx of colon polyps (Mother)   Subjective  HPI: Very pleasant 46 year old woman referred by her gynecologist for colon cancer screening and family history of colon polyps.  Holly Mcdonald tells me that her mother has had 3 colonoscopies with polyps each time.  She has 2-3 BMs daily, no rectal bleeding, no abdominal pain no chronic upper digestive symptoms.  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  Therefore she is on lifelong [place] managed by her PCP.   ROS:  Review of Systems Fibromyalgia symptoms Denies chest pain dyspnea or dysuria  Past Medical History: Past Medical History:  Diagnosis Date   Anemia    Anxiety    Arthritis    inflammatory   ASCUS with positive high risk HPV cervical 01/2019   Bladder spasm    COPD (chronic obstructive pulmonary disease) (HCC)    Fibromyalgia    GERD (gastroesophageal reflux disease)    Glaucoma    History of chronic bronchitis    last flare up 11-18-17   History of iron deficiency anemia    History of pulmonary embolism 10/2016--- followed by pcp and pulmoloigst   dx bilateral PE , bilateral lower lobes , segmental , unprovoked, negative hypercoagulable work-up-- treated w/ eliquis   Migraine    Mild intermittent asthma    pulmologist-  dr Elsworth Soho   Mixed connective tissue disease ([place])    Morbid obesity with BMI of 45.0-49.9, adult ([place])    Open-angle glaucoma of both eyes    Pre-diabetes    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   bilateral ---- stopped eliquis   Shingles    Sleep apnea    Vitamin D deficiency      Past Surgical History: Past Surgical History:  Procedure Laterality Date   ANAL EXAMINATION UNDER ANESTHESIA  age 68    w/ removal cyst   CYSTOSCOPY N/A 04/09/2019   Procedure: CYSTOSCOPY;  Surgeon: [name], MD;  Location: [place];  Service: Gynecology;  Laterality: N/A;   DILATATION & CURETTAGE/HYSTEROSCOPY WITH MYOSURE N/A 10/17/2017   Procedure: DILATATION & CURETTAGE/HYSTEROSCOPY WITH MYOSURE;  Surgeon: Anastasio Auerbach, MD;  Location: [place];  Service: Gynecology;  Laterality: N/A;   DILATATION & CURETTAGE/HYSTEROSCOPY WITH MYOSURE N/A 04/11/2018   Procedure: DILATATION & CURETTAGE/HYSTEROSCOPY WITH MYOSURE;  Surgeon: Anastasio Auerbach, MD;  Location: [place];  Service: Gynecology;  Laterality: N/A;   GYNECOLOGIC CRYOSURGERY  age 42s   TONSILLECTOMY  age 66   TOTAL LAPAROSCOPIC HYSTERECTOMY WITH SALPINGECTOMY Bilateral 04/09/2019   Procedure: TOTAL LAPAROSCOPIC HYSTERECTOMY WITH SALPINGECTOMY;  Surgeon: [name], MD;  Location: [place];  Service: Gynecology;  Laterality: Bilateral;  Has Mirena IUD in place, 3 hours surgery time. Extended recovery bed.   TRANSTHORACIC ECHOCARDIOGRAM  01/17/2017   ef 60-65%/  mild TR   "Cyst removed from rectum" after ruptured ovarian cysts 30 yrs ago  Family History: Family History  Problem Relation Age of Onset   Pulmonary embolism Mother        Last year   Fibromyalgia Mother    Heart disease Mother        CHF   Congestive Heart Failure Mother    Colon polyps Mother    Heart disease Father 20  CAD   Hyperlipidemia Father    Heart attack Father    Autism Sister    Sleep apnea Brother    Diabetes Paternal Grandmother    Heart attack Paternal Grandmother    Congestive Heart Failure Maternal Aunt    Deep vein thrombosis Maternal Aunt    Mental retardation Maternal Aunt    Cancer Neg Hx     Social History: Social History   Socioeconomic History   Marital status: Divorced    Spouse name: Not on file   Number of children: 0   Years of education: Not on file   Highest education level: Not on file   Occupational History   Occupation: representative    Comment: [employer]  Tobacco Use   Smoking status: Never   Smokeless tobacco: Never  Vaping Use   Vaping Use: Never used  Substance and Sexual Activity   Alcohol use: Yes    Comment: occasionally   Drug use: No   Sexual activity: Not Currently    Birth control/protection: None    Comment: 1st intercourse 46 yo-More than 5 partners Mirena 09/16/2017  Other Topics Concern   Not on file  Social History Narrative   Not on file   Social Determinants of Health   Financial Resource Strain: Not on file  Food Insecurity: Not on file  Transportation Needs: Not on file  Physical Activity: Not on file  Stress: Not on file  Social Connections: Not on file    Allergies: Allergies  Allergen Reactions   Other Nausea And Vomiting    Darvocet N-100 Other reaction(s): Vomiting   Darvon [Propoxyphene] Nausea And Vomiting    Outpatient Meds: Current Outpatient Medications  Medication Sig Dispense Refill   albuterol (VENTOLIN HFA) 108 (90 Base) MCG/ACT inhaler INHALE TWO PUFFS BY MOUTH EVERY 4 HOURS AS NEEDED FOR WHEEZING OR SHORTNESS OF BREATH 8.5 g 1   baclofen (LIORESAL) 10 MG tablet Take 1 tablet (10 mg total) by mouth 3 (three) times daily as needed. for muscle spams 90 tablet 5   benzonatate (TESSALON) 100 MG capsule Take 200 mg by mouth 3 (three) times daily as needed for cough.     BREO ELLIPTA 200-25 MCG/INH AEPB INHALE ONE DOSE BY MOUTH DAILY 60 each 5   cetirizine (ZYRTEC) 10 MG tablet Take 1 tablet (10 mg total) by mouth every morning. 30 tablet 5   Cyanocobalamin (VITAMIN B 12 PO) Take 1,000 mg by mouth daily.      DULoxetine (CYMBALTA) 60 MG capsule TAKE ONE CAPSULE BY MOUTH DAILY 30 capsule 2   ELIQUIS 5 MG TABS tablet TAKE ONE TABLET BY MOUTH TWICE A DAY 60 tablet 2   fluticasone (FLONASE) 50 MCG/ACT nasal spray SHAKE LIQUID AND USE 1 SPRAY IN EACH NOSTRIL DAILY--  in am 16 g 5   folic acid (FOLVITE) 1 MG tablet Take 2 mg by  mouth daily.      Golimumab ([place] ARIA IV) Inject into the vein.     ipratropium (ATROVENT) 0.03 % nasal spray Place 2 sprays into both nostrils 3 (three) times daily as needed (nasal drainage, throat clearing). 30 mL 5   lidocaine (XYLOCAINE) 5 % ointment Apply 1 application topically as needed. 35.44 g 0   methocarbamol (ROBAXIN) 500 MG tablet Take 1 tablet (500 mg total) by mouth 2 (two) times daily as needed for muscle spasms. 60 tablet 1   methotrexate 2.5 MG tablet Take 15 mg by mouth once a week. Friday in the evening  montelukast (SINGULAIR) 10 MG tablet TAKE ONE TABLET BY MOUTH EVERY NIGHT AT BEDTIME 30 tablet 5   mupirocin ointment (BACTROBAN) 2 % PLACE 1 APPLICATION INTO THE NOSE TWO TIMES A DAY FOR 10 DAYS 22 g 0   nortriptyline (PAMELOR) 10 MG capsule TAKE ONE CAPSULE BY MOUTH DAILY AS NEEDED FOR SLEEP 30 capsule 3   omeprazole (PRILOSEC) 40 MG capsule TAKE 1 CAPSULE(40 MG) BY MOUTH DAILY 90 capsule 2   oxybutynin (DITROPAN) 5 MG tablet Take 5 mg by mouth 2 (two) times daily.   11   pregabalin (LYRICA) 75 MG capsule TAKE 1 CAPSULE BY MOUTH TWO TIMES A DAY 60 capsule 3   timolol (TIMOPTIC) 0.5 % ophthalmic solution Place 1 drop into both eyes daily.      topiramate (TOPAMAX) 25 MG tablet Take 75 mg by mouth at bedtime.   1   Vitamin D, Ergocalciferol, (DRISDOL) 1.25 MG (50000 UNIT) CAPS capsule TAKE 1 CAPSULE BY MOUTH EVERY 10 DAYS 3 capsule 4   No current facility-administered medications for this visit.      ___________________________________________________________________ Objective   Exam:  BP 90/60 (BP Location: Left Arm, Patient Position: Sitting, Cuff Size: Large)   Pulse 80   Ht '5\' 10"'$  (1.778 m) Comment: height measured without shoes  Wt (!) 343 lb (155.6 kg)   LMP 03/05/2019   BMI 49.22 kg/m  Wt Readings from Last 3 Encounters:  12/18/21 (!) 343 lb (155.6 kg)  10/29/21 (!) 340 lb 3.2 oz (154.3 kg)  10/02/21 (!) 339 lb (153.8 kg)    General:  Well-appearing Eyes: sclera anicteric, no redness ENT: oral mucosa moist without lesions, no cervical or supraclavicular lymphadenopathy CV: Regular without murmur, no JVD, no peripheral edema Resp: clear to auscultation bilaterally, normal RR and effort noted GI: soft, no tenderness, with active bowel sounds.  Evaluation for mass or HSM limited by body habitus. Skin; warm and dry, no rash or jaundice noted Normal mental status, normal gross motor function, ambulatory  Labs:  Assessment: Encounter Diagnoses  Name Primary?   Special screening for malignant neoplasms, colon Yes   Family history of colonic polyps    Obesity, Class III, BMI 40-49.9 (morbid obesity) ([place])    Long term (current) use of antithrombotics/antiplatelets     Average risk colorectal cancer, screening colonoscopy recommended. Elevated BMI increases sedation risk, recommend procedure done in hospital outpatient endoscopy lab. She was agreeable after discussion of procedure and risks, and would like to do it later this year for work schedule reasons. We are booking out at least 2 to 3 months for routine hospital-based procedures due to schedule and staffing issues.  We will contact her when we have schedule availability.  At that point, we would contact primary care regarding a 1 day hold of Eliquis. Holly Mcdonald tells me she has been able to hold the medicine briefly for prior gynecologic procedures.   Thank you for the courtesy of this consult.  Please call me with any questions or concerns.  Nelida Meuse III  CC: Referring provider noted above

## 2022-01-13 ENCOUNTER — Telehealth: Payer: Self-pay

## 2022-01-13 ENCOUNTER — Other Ambulatory Visit: Payer: Self-pay

## 2022-01-13 DIAGNOSIS — Z1211 Encounter for screening for malignant neoplasm of colon: Secondary | ICD-10-CM

## 2022-01-13 DIAGNOSIS — Z8371 Family history of colonic polyps: Secondary | ICD-10-CM

## 2022-01-13 MED ORDER — NA SULFATE-K SULFATE-MG SULF 17.5-3.13-1.6 GM/177ML PO SOLN: 1.0000 | Freq: Once | ORAL | 0 refills | Status: AC

## 2022-01-13 NOTE — Telephone Encounter (Signed)
Left patient a detailed vm offering her a colonoscopy appt at [facility] on Thursday, 02/18/22. I advised that she would need a care partner for her appt. I asked that pt call back so that we can discuss specific times.

## 2022-01-13 NOTE — Telephone Encounter (Signed)
Pt returned call. She is able to make colonoscopy appt on Thursday, 02/18/22. Patient is aware that her appt will be at 7:30 am and she will need to arrive at [facility] by 6 am with a care partner. Pt would like RX for prep sent to [facility]. Pt knows to pick up prep within the next few days. Pt has access to her MyChart and knows that I will send her instructions there and will also mail her a copy. Pt confirmed address on file. Pt verbalized understanding of all information and had no concerns at the end of the call.  Ambulatory referral to GI. Colonoscopy instructions sent to patient via MyChart and mailed. Suprep sent to pharmacy on file.

## 2022-02-18 ENCOUNTER — Ambulatory Visit (HOSPITAL_COMMUNITY): Payer: 59 | Admitting: Certified Registered"

## 2022-02-18 ENCOUNTER — Encounter (HOSPITAL_COMMUNITY)
Admission: RE | Disposition: A | Discharge status: Home / Self Care | Payer: Self-pay | Source: Home / Self Care | Attending: Gastroenterology

## 2022-02-18 ENCOUNTER — Encounter (HOSPITAL_COMMUNITY): Payer: Self-pay | Admitting: Gastroenterology

## 2022-02-18 ENCOUNTER — Ambulatory Visit (HOSPITAL_BASED_OUTPATIENT_CLINIC_OR_DEPARTMENT_OTHER): Payer: 59 | Admitting: Certified Registered"

## 2022-02-18 ENCOUNTER — Other Ambulatory Visit: Payer: Self-pay

## 2022-02-18 ENCOUNTER — Ambulatory Visit (HOSPITAL_COMMUNITY)
Admission: RE | Admit: 2022-02-18 | Discharge: 2022-02-18 | Disposition: A | Discharge status: Home / Self Care | Payer: 59 | Attending: Gastroenterology | Admitting: Gastroenterology

## 2022-02-18 DIAGNOSIS — J449 Chronic obstructive pulmonary disease, unspecified: Secondary | ICD-10-CM | POA: Diagnosis not present

## 2022-02-18 DIAGNOSIS — J45909 Unspecified asthma, uncomplicated: Secondary | ICD-10-CM | POA: Diagnosis not present

## 2022-02-18 DIAGNOSIS — M797 Fibromyalgia: Secondary | ICD-10-CM | POA: Insufficient documentation

## 2022-02-18 DIAGNOSIS — Z86711 Personal history of pulmonary embolism: Secondary | ICD-10-CM | POA: Diagnosis not present

## 2022-02-18 DIAGNOSIS — Z1211 Encounter for screening for malignant neoplasm of colon: Secondary | ICD-10-CM | POA: Diagnosis present

## 2022-02-18 DIAGNOSIS — Z1212 Encounter for screening for malignant neoplasm of rectum: Secondary | ICD-10-CM

## 2022-02-18 DIAGNOSIS — K219 Gastro-esophageal reflux disease without esophagitis: Secondary | ICD-10-CM | POA: Insufficient documentation

## 2022-02-18 DIAGNOSIS — Z6841 Body Mass Index (BMI) 40.0 and over, adult: Secondary | ICD-10-CM | POA: Insufficient documentation

## 2022-02-18 DIAGNOSIS — F419 Anxiety disorder, unspecified: Secondary | ICD-10-CM

## 2022-02-18 DIAGNOSIS — G4730 Sleep apnea, unspecified: Secondary | ICD-10-CM | POA: Insufficient documentation

## 2022-02-18 DIAGNOSIS — Z83719 Family history of colon polyps, unspecified: Secondary | ICD-10-CM

## 2022-02-18 HISTORY — PX: COLONOSCOPY WITH PROPOFOL: SHX5780

## 2022-02-18 SURGERY — COLONOSCOPY WITH PROPOFOL
Anesthesia: Monitor Anesthesia Care

## 2022-02-18 MED ORDER — PROPOFOL 500 MG/50ML IV EMUL
INTRAVENOUS | Status: DC | PRN
  Administered 2022-02-18: 200 ug/kg/min via INTRAVENOUS

## 2022-02-18 MED ORDER — GLYCOPYRROLATE PF 0.2 MG/ML IJ SOSY
PREFILLED_SYRINGE | INTRAMUSCULAR | Status: DC | PRN
  Administered 2022-02-18: .2 mg via INTRAVENOUS

## 2022-02-18 MED ORDER — PROPOFOL 10 MG/ML IV BOLUS
INTRAVENOUS | Status: DC | PRN
  Administered 2022-02-18: 100 mg via INTRAVENOUS

## 2022-02-18 MED ORDER — SODIUM CHLORIDE 0.9 % IV SOLN: INTRAVENOUS | Status: DC

## 2022-02-18 MED ORDER — LACTATED RINGERS IV SOLN: INTRAVENOUS | Status: DC | PRN

## 2022-02-18 MED ORDER — LIDOCAINE 2% (20 MG/ML) 5 ML SYRINGE
INTRAMUSCULAR | Status: DC | PRN
  Administered 2022-02-18: 70 mg via INTRAVENOUS

## 2022-02-18 SURGICAL SUPPLY — 22 items

## 2022-02-18 NOTE — Transfer of Care (Signed)
Immediate Anesthesia Transfer of Care Note  Patient: Holly Mcdonald  Procedure(s) Performed: COLONOSCOPY WITH PROPOFOL  Patient Location: PACU  Anesthesia Type:MAC  Level of Consciousness: awake, alert  and oriented  Airway & Oxygen Therapy: Patient Spontanous Breathing  Post-op Assessment: Report given to RN and Post -op Vital signs reviewed and stable  Post vital signs: Reviewed and stable  Last Vitals:  Vitals Value Taken Time  BP 105/64 02/18/22 0756  Temp    Pulse 79 02/18/22 0758  Resp 18 02/18/22 0758  SpO2 97 % 02/18/22 0758  Vitals shown include unvalidated device data.  Last Pain:  Vitals:   02/18/22 0701  TempSrc: Temporal  PainSc: 5       Patients Stated Pain Goal: 4 (43/83/77 9396)  Complications: No notable events documented.

## 2022-02-18 NOTE — Interval H&P Note (Signed)
History and Physical Interval Note:  02/18/2022 7:25 AM  Holly Mcdonald  has presented today for surgery, with the diagnosis of screening colonoscopy.  The various methods of treatment have been discussed with the patient and family. After consideration of risks, benefits and other options for treatment, the patient has consented to  Procedure(s): COLONOSCOPY WITH PROPOFOL (N/A) as a surgical intervention.  The patient's history has been reviewed, patient examined, no change in status, stable for surgery.  I have reviewed the patient's chart and labs.  Questions were answered to the patient's satisfaction.     Nelida Meuse III

## 2022-02-18 NOTE — Anesthesia Postprocedure Evaluation (Signed)
Anesthesia Post Note  Patient: Holly Mcdonald  Procedure(s) Performed: COLONOSCOPY WITH PROPOFOL     Patient location during evaluation: Phase II Anesthesia Type: MAC Level of consciousness: awake Pain management: pain level controlled Respiratory status: spontaneous breathing Postop Assessment: no apparent nausea or vomiting Anesthetic complications: no   No notable events documented.  Last Vitals:  Vitals:   02/18/22 0810 02/18/22 0815  BP:  127/84  Pulse: 90 95  Resp: (!) 22 20  Temp:  36.5 C  SpO2: 96% 96%    Last Pain:  Vitals:   02/18/22 0815  TempSrc:   PainSc: 0-No pain                 Huston Foley

## 2022-02-18 NOTE — Anesthesia Preprocedure Evaluation (Signed)
Anesthesia Evaluation  Patient identified by MRN, date of birth, ID band Patient awake    Reviewed: Allergy & Precautions, NPO status , Patient's Chart, lab work & pertinent test results  Airway Mallampati: II  TM Distance: >3 FB Neck ROM: Full    Dental  (+) Chipped,    Pulmonary asthma , sleep apnea ,    breath sounds clear to auscultation       Cardiovascular negative cardio ROS Normal cardiovascular exam Rhythm:Regular Rate:Normal     Neuro/Psych  Headaches, PSYCHIATRIC DISORDERS Anxiety    GI/Hepatic Neg liver ROS, GERD  Medicated and Controlled,  Endo/Other  Morbid obesity  Renal/GU negative Renal ROS     Musculoskeletal  (+) Arthritis , Fibromyalgia -  Abdominal (+) + obese,   Peds  Hematology negative hematology ROS (+)   Anesthesia Other Findings   Reproductive/Obstetrics                             Lab Results  Component Value Date   WBC 6.0 04/26/2019   HGB 13.3 04/26/2019   HCT 39.5 04/26/2019   MCV 91.9 04/26/2019   PLT 273 04/26/2019   Lab Results  Component Value Date   CREATININE 0.79 04/26/2019   BUN 11 04/26/2019   NA 140 04/26/2019   K 4.1 04/26/2019   CL 108 04/26/2019   CO2 22 04/26/2019   No results found for: "INR", "PROTIME"   Anesthesia Physical  Anesthesia Plan  ASA: III  Anesthesia Plan: MAC   Post-op Pain Management:    Induction:   PONV Risk Score and Plan: 3 and Propofol infusion and TIVA  Airway Management Planned: Natural Airway, Nasal Cannula and Simple Face Mask  Additional Equipment: None  Intra-op Plan:   Post-operative Plan:   Informed Consent: I have reviewed the patients History and Physical, chart, labs and discussed the procedure including the risks, benefits and alternatives for the proposed anesthesia with the patient or authorized representative who has indicated his/her understanding and acceptance.       Plan  Discussed with: CRNA  Anesthesia Plan Comments: (Pertinent hx includes unprovoked PE (on eliquis), retinal hemorrhage, mixed connective tissue disease, OSA on CPAP. Pt sees multiple specialists due to her medical problems. Dr. Hale Bogus corresponded with all pertinent treating physicians regarding surgery. Per telephone encounter 04/02/19 "Dr. Kathlene November, rheumatology, recommended stopping her methotrexate about 1 week prior to surgery and staying off for about 10 days post op.  He does not need to see her. Dr. Idolina Primer, ophthalmology, felt she was safe for the [facility] including the positioning I will need.  He does not need to see her. Dr. Elsworth Soho, pulmonology, recommended she use her CPAP post op so she should bring it with her.  He also recommended the prolonged DVT/PE prophylaxis.  He does not need to see her. Dr. Burr Medico, hematology, agreed with surgery and felt this would make the Eliquis safer for her in the long run.  She needs to stop this three full days prior to surgery.  Thursday should be her last dose.  She does not need to see her. Will need to use Lovenox for 14 days post op."  Pt was evaluated by cardiology after PE in 2018. She had an echo showing normal LV and RV size and function and mild MVP without regurgitation. Low risk coronary calcium score and coronary CT angiogram. Coronary calcium score was 29.  Last seen by Dr. Ellyn Hack 04/04/17  and advised to followup PRN.    Preop labs reviewed, WNL.  EKG 06/27/18 NSR. Left axis deviation. Rate 90. Low voltage QRS. Inferior infarct , age undetermined. Possible Anterolateral infarct , age undetermined. No significant change since last tracing  TTE 01/17/17: - Left ventricle: The cavity size was normal. Systolic function was   normal. The estimated ejection fraction was in the range of 60%   to 65%. Wall motion was normal; there were no regional wall   motion abnormalities. Left ventricular diastolic function   parameters were normal. - Aortic valve:  Transvalvular velocity was within the normal range.   There was no stenosis. There was no regurgitation. - Mitral valve: Calcified annulus. Mild prolapse, involving the   anterior leaflet and the posterior leaflet. Transvalvular   velocity was within the normal range. There was no evidence for   stenosis. There was no regurgitation. - Right ventricle: The cavity size was normal. Wall thickness was   normal. Systolic function was normal. - Tricuspid valve: There was mild regurgitation. - Pulmonary arteries: Systolic pressure was within the normal   range. PA peak pressure: 20 mm Hg (S).  CT cardiac 01/17/17: IMPRESSION: Coronary calcium score of 29 . This was 97th percentile for age and sex matched control.)        Anesthesia Quick Evaluation

## 2022-02-18 NOTE — H&P (Signed)
History and Physical:  This patient presents for endoscopic testing for:  Average risk for colorectal cancer, for screening colonoscopy. On chronic oral anticoagulation for history of unprovoked PE, medicine has been held briefly before procedure. Further clinical details in [facility] office note of 12/18/2021.   Patient is otherwise without complaints or active issues today.   Past Medical History: Past Medical History:  Diagnosis Date   Anemia    Anxiety    Arthritis    inflammatory   ASCUS with positive high risk HPV cervical 01/2019   Bladder spasm    COPD (chronic obstructive pulmonary disease) (HCC)    Fibromyalgia    GERD (gastroesophageal reflux disease)    Glaucoma    History of chronic bronchitis    last flare up 11-18-17   History of iron deficiency anemia    History of pulmonary embolism 10/2016--- followed by pcp and pulmoloigst   dx bilateral PE , bilateral lower lobes , segmental , unprovoked, negative hypercoagulable work-up-- treated w/ eliquis   Migraine    Mild intermittent asthma    pulmologist-  dr Elsworth Soho   Mixed connective tissue disease ([place])    Morbid obesity with BMI of 45.0-49.9, adult ([place])    Open-angle glaucoma of both eyes    Pre-diabetes    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   bilateral ---- stopped eliquis   Shingles    Sleep apnea    Vitamin D deficiency      Past Surgical History: Past Surgical History:  Procedure Laterality Date   ANAL EXAMINATION UNDER ANESTHESIA  age 79   w/ removal cyst   CYSTOSCOPY N/A 04/09/2019   Procedure: CYSTOSCOPY;  Surgeon: [name], MD;  Location: [place];  Service: Gynecology;  Laterality: N/A;   DILATATION & CURETTAGE/HYSTEROSCOPY WITH MYOSURE N/A 10/17/2017   Procedure: DILATATION & CURETTAGE/HYSTEROSCOPY WITH MYOSURE;  Surgeon: Anastasio Auerbach, MD;  Location: [place];  Service: Gynecology;  Laterality: N/A;   DILATATION & CURETTAGE/HYSTEROSCOPY WITH MYOSURE N/A 04/11/2018    Procedure: DILATATION & CURETTAGE/HYSTEROSCOPY WITH MYOSURE;  Surgeon: Anastasio Auerbach, MD;  Location: [place];  Service: Gynecology;  Laterality: N/A;   GYNECOLOGIC CRYOSURGERY  age 10s   TONSILLECTOMY  age 49   TOTAL LAPAROSCOPIC HYSTERECTOMY WITH SALPINGECTOMY Bilateral 04/09/2019   Procedure: TOTAL LAPAROSCOPIC HYSTERECTOMY WITH SALPINGECTOMY;  Surgeon: [name], MD;  Location: [place];  Service: Gynecology;  Laterality: Bilateral;  Has Mirena IUD in place, 3 hours surgery time. Extended recovery bed.   TRANSTHORACIC ECHOCARDIOGRAM  01/17/2017   ef 60-65%/  mild TR    Allergies: Allergies  Allergen Reactions   Other Nausea And Vomiting    Darvocet N-100 Other reaction(s): Vomiting   Darvon [Propoxyphene] Nausea And Vomiting    Outpatient Meds: Current Facility-Administered Medications  Medication Dose Route Frequency Provider Last Rate Last Admin   0.9 %  sodium chloride infusion   Intravenous Continuous Danis, Estill Cotta III, MD          ___________________________________________________________________ Objective   Exam:  BP (!) 126/93   Pulse 96   Temp (!) 96.5 F (35.8 C) (Temporal)   Resp 18   Ht '5\' 10"'$  (1.778 m)   Wt (!) 154.7 kg   LMP 03/05/2019   SpO2 96%   BMI 48.93 kg/m   CV: regular , S1/S2 Resp: clear to auscultation bilaterally, normal RR and effort noted GI: soft, no tenderness, with active bowel sounds.   Assessment: Colon cancer screening  Plan: Colonoscopy  The benefits and risks of the planned procedure were described in detail with the patient or (when appropriate) their health care proxy.  Risks were outlined as including, but not limited to, bleeding, infection, perforation, adverse medication reaction leading to cardiac or pulmonary decompensation, pancreatitis (if ERCP).  The limitation of incomplete mucosal visualization was also discussed.  No guarantees or warranties were given.    The patient is  appropriate for an endoscopic procedure in the ambulatory setting.   - Wilfrid Lund, MD

## 2022-02-18 NOTE — Op Note (Signed)
[facility] Patient Name: Holly Mcdonald Procedure Date : 02/18/2022 MRN: [id number] Attending MD: Estill Cotta. Loletha Carrow , MD Date of Birth: 10/30/1975 CSN: [id number] Age: 46 Admit Type: Outpatient Procedure:                Colonoscopy Indications:              Screening for colorectal malignant neoplasm, This                            is the patient's first colonoscopy Providers:                Estill Cotta. Loletha Carrow, MD, Allayne Gitelman, RN, Gloris Ham, Technician Referring MD:             Jayme Cloud. Collins Medicines:                Monitored Anesthesia Care Complications:            No immediate complications. Estimated Blood Loss:     Estimated blood loss: none. Procedure:                Pre-Anesthesia Assessment:                           - Prior to the procedure, a History and Physical                            was performed, and patient medications and                            allergies were reviewed. The patient's tolerance of                            previous anesthesia was also reviewed. The risks                            and benefits of the procedure and the sedation                            options and risks were discussed with the patient.                            All questions were answered, and informed consent                            was obtained. Prior Anticoagulants: The patient has                            taken Eliquis (apixaban), last dose was 2 days                            prior to procedure. ASA Grade Assessment: III - A  patient with severe systemic disease. After                            reviewing the risks and benefits, the patient was                            deemed in satisfactory condition to undergo the                            procedure.                           After obtaining informed consent, the colonoscope                            was passed under direct vision.  Throughout the                            procedure, the patient's blood pressure, pulse, and                            oxygen saturations were monitored continuously. The                            CF-HQ190L ([id number]) Olympus colonoscope was                            introduced through the anus and advanced to the the                            cecum, identified by appendiceal orifice and                            ileocecal valve. The colonoscopy was performed                            without difficulty. The patient tolerated the                            procedure well. The quality of the bowel                            preparation was excellent. The ileocecal valve,                            appendiceal orifice, and rectum were photographed. Scope In: 7:35:56 AM Scope Out: 7:48:21 AM Scope Withdrawal Time: 0 hours 9 minutes 27 seconds  Total Procedure Duration: 0 hours 12 minutes 25 seconds  Findings:      The perianal and digital rectal examinations were normal.      Repeat examination of right colon under NBI performed.      The entire examined colon appeared normal on direct and retroflexion       views. Impression:               - The entire examined  colon is normal on direct and                            retroflexion views.                           - No specimens collected. Recommendation:           - Patient has a contact number available for                            emergencies. The signs and symptoms of potential                            delayed complications were discussed with the                            patient. Return to normal activities tomorrow.                            Written discharge instructions were provided to the                            patient.                           - Resume previous diet.                           - Resume Eliquis (apixaban) at prior dose today.                           - Repeat colonoscopy in 10 years for  screening                            purposes. Procedure Code(s):        --- Professional ---                           M7672, Colorectal cancer screening; colonoscopy on                            individual not meeting criteria for high risk Diagnosis Code(s):        --- Professional ---                           Z12.11, Encounter for screening for malignant                            neoplasm of colon CPT copyright 2019 [employer]. All rights reserved. The codes documented in this report are preliminary and upon coder review may  be revised to meet current compliance requirements. Bret Vanessen L. Loletha Carrow, MD 02/18/2022 7:59:30 AM This report has been signed electronically. Number of Addenda: 0

## 2022-02-20 ENCOUNTER — Other Ambulatory Visit (HOSPITAL_BASED_OUTPATIENT_CLINIC_OR_DEPARTMENT_OTHER): Payer: Self-pay

## 2022-02-20 ENCOUNTER — Emergency Department (HOSPITAL_BASED_OUTPATIENT_CLINIC_OR_DEPARTMENT_OTHER): Payer: 59

## 2022-02-20 ENCOUNTER — Emergency Department (HOSPITAL_BASED_OUTPATIENT_CLINIC_OR_DEPARTMENT_OTHER)
Admission: EM | Admit: 2022-02-20 | Discharge: 2022-02-20 | Disposition: A | Discharge status: Home / Self Care | Payer: 59 | Attending: Emergency Medicine | Admitting: Emergency Medicine

## 2022-02-20 ENCOUNTER — Other Ambulatory Visit: Payer: Self-pay

## 2022-02-20 ENCOUNTER — Encounter (HOSPITAL_BASED_OUTPATIENT_CLINIC_OR_DEPARTMENT_OTHER): Payer: Self-pay | Admitting: Emergency Medicine

## 2022-02-20 ENCOUNTER — Encounter: Payer: Self-pay | Admitting: Hematology

## 2022-02-20 DIAGNOSIS — U071 COVID-19: Secondary | ICD-10-CM | POA: Diagnosis not present

## 2022-02-20 DIAGNOSIS — J4520 Mild intermittent asthma, uncomplicated: Secondary | ICD-10-CM | POA: Insufficient documentation

## 2022-02-20 DIAGNOSIS — R000 Tachycardia, unspecified: Secondary | ICD-10-CM | POA: Diagnosis not present

## 2022-02-20 DIAGNOSIS — Z7951 Long term (current) use of inhaled steroids: Secondary | ICD-10-CM | POA: Diagnosis not present

## 2022-02-20 DIAGNOSIS — Z7901 Long term (current) use of anticoagulants: Secondary | ICD-10-CM | POA: Diagnosis not present

## 2022-02-20 DIAGNOSIS — J449 Chronic obstructive pulmonary disease, unspecified: Secondary | ICD-10-CM | POA: Insufficient documentation

## 2022-02-20 DIAGNOSIS — R059 Cough, unspecified: Secondary | ICD-10-CM | POA: Diagnosis present

## 2022-02-20 LAB — CBC WITH DIFFERENTIAL/PLATELET
Abs Immature Granulocytes: 0.03 10*3/uL (ref 0.00–0.07)
Basophils Absolute: 0 10*3/uL (ref 0.0–0.1)
Basophils Relative: 1 %
Eosinophils Absolute: 0 10*3/uL (ref 0.0–0.5)
Eosinophils Relative: 1 %
HCT: 41.2 % (ref 36.0–46.0)
Hemoglobin: 13.8 g/dL (ref 12.0–15.0)
Immature Granulocytes: 1 %
Lymphocytes Relative: 12 %
Lymphs Abs: 0.8 10*3/uL (ref 0.7–4.0)
MCH: 29.7 pg (ref 26.0–34.0)
MCHC: 33.5 g/dL (ref 30.0–36.0)
MCV: 88.8 fL (ref 80.0–100.0)
Monocytes Absolute: 0.4 10*3/uL (ref 0.1–1.0)
Monocytes Relative: 7 %
Neutro Abs: 5.3 10*3/uL (ref 1.7–7.7)
Neutrophils Relative %: 78 %
Platelets: 178 10*3/uL (ref 150–400)
RBC: 4.64 MIL/uL (ref 3.87–5.11)
RDW: 12.7 % (ref 11.5–15.5)
WBC: 6.6 10*3/uL (ref 4.0–10.5)
nRBC: 0 % (ref 0.0–0.2)

## 2022-02-20 LAB — BASIC METABOLIC PANEL
Anion gap: 9 (ref 5–15)
BUN: 10 mg/dL (ref 6–20)
CO2: 26 mmol/L (ref 22–32)
Calcium: 9.4 mg/dL (ref 8.9–10.3)
Chloride: 103 mmol/L (ref 98–111)
Creatinine, Ser: 0.72 mg/dL (ref 0.44–1.00)
GFR, Estimated: >60 mL/min (ref >60)
Glucose, Bld: 103 mg/dL — ABNORMAL HIGH (ref 70–99)
Potassium: 4.3 mmol/L (ref 3.5–5.1)
Sodium: 138 mmol/L (ref 135–145)

## 2022-02-20 LAB — RESP PANEL BY RT-PCR (FLU A&B, COVID) ARPGX2
Influenza A by PCR: NEGATIVE
Influenza B by PCR: NEGATIVE
SARS Coronavirus 2 by RT PCR: POSITIVE — AB

## 2022-02-20 MED ORDER — SODIUM CHLORIDE 0.9 % IV BOLUS
1000.0000 mL | Freq: Once | INTRAVENOUS | Status: AC
  Administered 2022-02-20: 1000 mL via INTRAVENOUS

## 2022-02-20 MED ORDER — ACETAMINOPHEN 500 MG PO TABS
1000.0000 mg | ORAL_TABLET | Freq: Once | ORAL | Status: AC
  Administered 2022-02-20: 1000 mg via ORAL
  Filled 2022-02-20: qty 2

## 2022-02-20 MED ORDER — MOLNUPIRAVIR 200 MG PO CAPS
4.0000 | ORAL_CAPSULE | Freq: Two times a day (BID) | ORAL | 0 refills | Status: AC
  Filled 2022-02-20: qty 40, 5d supply, fill #0

## 2022-02-20 NOTE — ED Triage Notes (Signed)
Pt was around her grandmother who has cough and diarrhea.pt started with cough yesterday and wants to get checked out, she had pe 2018, has asthma and chronic bronchitis and wants to be safe with that history.

## 2022-02-20 NOTE — ED Provider Notes (Signed)
[facility] EMERGENCY DEPT Provider Note  CSN: [id number] Arrival date & time: 02/20/22 0825  Chief Complaint(s) Cough  HPI Holly Mcdonald is a 46 y.o. female with history of pulmonary embolism on Eliquis, COPD, connective tissue disease presenting to the emergency department with cough.  She reports that she has had cough for 1 day, she was around her grandmother who is sick with similar symptoms.  She denies sore throat, runny nose.  She reports coughing up clear phlegm.  No chest pain, shortness of breath.  No abdominal pain.  Reports some diarrhea, had recent colonoscopy and having some ongoing diarrhea.  No blood in the stool.  No nausea or vomiting.  Symptoms mild.   Past Medical History Past Medical History:  Diagnosis Date   Anemia    Anxiety    Arthritis    inflammatory   ASCUS with positive high risk HPV cervical 01/2019   Bladder spasm    COPD (chronic obstructive pulmonary disease) (HCC)    Fibromyalgia    GERD (gastroesophageal reflux disease)    Glaucoma    History of chronic bronchitis    last flare up 11-18-17   History of iron deficiency anemia    History of pulmonary embolism 10/2016--- followed by pcp and pulmoloigst   dx bilateral PE , bilateral lower lobes , segmental , unprovoked, negative hypercoagulable work-up-- treated w/ eliquis   Migraine    Mild intermittent asthma    pulmologist-  dr Elsworth Soho   Mixed connective tissue disease ([place])    Morbid obesity with BMI of 45.0-49.9, adult ([place])    Open-angle glaucoma of both eyes    Pre-diabetes    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   bilateral ---- stopped eliquis   Shingles    Sleep apnea    Vitamin D deficiency    Patient Active Problem List   Diagnosis Date Noted   Special screening for malignant neoplasms, colon    H/O: hysterectomy 09/28/2020   High risk HPV infection 09/28/2020   Abnormality of gait 12/25/2019   Morbid obesity ([place]) 06/26/2019   OSA (obstructive sleep apnea)  03/01/2019   Daytime sleepiness 01/26/2019   Sleep disorder 12/14/2018   Fibromyalgia 08/16/2018   Autoimmune disease ([place]) 03/24/2018   History of pulmonary embolus (PE) 03/24/2018   B12 deficiency 02/15/2018   Connective tissue disease ([place]) 02/08/2018   Inflammatory arthritis 02/08/2018   Chronic pain syndrome 12/16/2017   Incontinence of urine in female 09/16/2017   Glaucoma, open angle, mild stage 07/27/2017   Polyarthralgia 07/06/2017   Retinal hemorrhage noted on examination of both eyes 06/30/2017   Allergic rhinitis 03/07/2017   Hyperlipidemia 02/24/2017   Pre-diabetes 02/24/2017   Anxiety disorder 01/24/2017   Shortness of breath 01/13/2017   Costochondritis 01/13/2017   Family history of premature CAD 01/13/2017   GERD (gastroesophageal reflux disease) 12/21/2016   Vitamin D deficiency, unspecified 12/21/2016   Iron deficiency anemia due to chronic blood loss 12/20/2016   Asthma in adult, unspecified asthma severity, uncomplicated 85/63/1497   Obesity, Class III, BMI 40-49.9 (morbid obesity) ([place]) 11/04/2016   Home Medication(s) Prior to Admission medications   Medication Sig Start Date End Date Taking? Authorizing Provider  molnupiravir EUA (LAGEVRIO) 200 MG CAPS capsule Take 4 capsules (800 mg total) by mouth 2 (two) times daily for 5 days. 02/20/22 02/25/22 Yes Cristie Hem, MD  albuterol (VENTOLIN HFA) 108 (90 Base) MCG/ACT inhaler INHALE TWO PUFFS BY MOUTH EVERY 4 HOURS AS NEEDED FOR WHEEZING OR SHORTNESS OF BREATH  11/04/21   Kennith Gain, MD  baclofen (LIORESAL) 10 MG tablet Take 1 tablet (10 mg total) by mouth 3 (three) times daily as needed. for muscle spams 03/03/21   Raulkar, Clide Deutscher, MD  benzonatate (TESSALON) 100 MG capsule Take 200 mg by mouth 3 (three) times daily as needed for cough.    [provider]  Adair Patter [phone] MCG/INH AEPB INHALE ONE DOSE BY MOUTH DAILY 08/01/20   Kennith Gain, MD  cetirizine (ZYRTEC)  10 MG tablet Take 1 tablet (10 mg total) by mouth every morning. 12/19/19   Kennith Gain, MD  Cyanocobalamin (VITAMIN B 12 PO) Take 1,000 mg by mouth daily.     [provider]  DULoxetine (CYMBALTA) 60 MG capsule TAKE ONE CAPSULE BY MOUTH DAILY 03/16/21   Raulkar, Clide Deutscher, MD  ELIQUIS 5 MG TABS tablet TAKE ONE TABLET BY MOUTH TWICE A DAY 09/26/19   Truitt Merle, MD  fluticasone (FLONASE) 50 MCG/ACT nasal spray SHAKE LIQUID AND USE 1 SPRAY IN EACH NOSTRIL DAILY--  in am Patient taking differently: Place 1 spray into both nostrils daily as needed for allergies. 12/19/19   Kennith Gain, MD  folic acid (FOLVITE) 1 MG tablet Take 2 mg by mouth daily.     [provider]  golimumab (SIMPONI ARIA) 50 MG/4ML SOLN injection Inject into the vein every 8 (eight) weeks.    [provider]  ipratropium (ATROVENT) 0.03 % nasal spray Place 2 sprays into both nostrils 3 (three) times daily as needed (nasal drainage, throat clearing). 12/19/19   Kennith Gain, MD  lidocaine (XYLOCAINE) 5 % ointment Apply 1 application topically as needed. 02/21/21   Sherrell Puller, PA-C  methocarbamol (ROBAXIN) 500 MG tablet Take 1 tablet (500 mg total) by mouth 2 (two) times daily as needed for muscle spasms. 02/08/18   Jamse Arn, MD  methotrexate 2.5 MG tablet Take 15 mg by mouth once a week. Friday in the evening    [provider]  montelukast (SINGULAIR) 10 MG tablet TAKE ONE TABLET BY MOUTH EVERY NIGHT AT BEDTIME 04/27/21   Kennith Gain, MD  mupirocin ointment (BACTROBAN) 2 % PLACE 1 APPLICATION INTO THE NOSE TWO TIMES A DAY FOR 10 DAYS Patient taking differently: Apply 1 Application topically daily as needed (ulcers in nose). 02/26/21   Kennith Gain, MD  nortriptyline (PAMELOR) 10 MG capsule TAKE ONE CAPSULE BY MOUTH DAILY AS NEEDED FOR SLEEP 07/09/21   Raulkar, Clide Deutscher, MD  omeprazole (PRILOSEC) 40 MG capsule TAKE 1 CAPSULE(40  MG) BY MOUTH DAILY 04/27/19   Martinique, Betty G, MD  oxybutynin (DITROPAN) 5 MG tablet Take 5 mg by mouth 2 (two) times daily.  12/28/16   [provider]  pregabalin (LYRICA) 75 MG capsule TAKE 1 CAPSULE BY MOUTH TWO TIMES A DAY 11/30/21   Raulkar, Clide Deutscher, MD  timolol (TIMOPTIC) 0.5 % ophthalmic solution Place 1 drop into both eyes daily.     [provider]  topiramate (TOPAMAX) 25 MG tablet Take 75 mg by mouth at bedtime.  05/26/17   [provider]  Vitamin D, Ergocalciferol, (DRISDOL) 1.25 MG (50000 UNIT) CAPS capsule TAKE 1 CAPSULE BY MOUTH EVERY 10 DAYS Patient taking differently: Take 50,000 Units by mouth every Friday. 11/20/19   Martinique, Betty G, MD  Past Surgical History Past Surgical History:  Procedure Laterality Date   ANAL EXAMINATION UNDER ANESTHESIA  age 16   w/ removal cyst   CYSTOSCOPY N/A 04/09/2019   Procedure: CYSTOSCOPY;  Surgeon: [name], MD;  Location: [facility];  Service: Gynecology;  Laterality: N/A;   DILATATION & CURETTAGE/HYSTEROSCOPY WITH MYOSURE N/A 10/17/2017   Procedure: DILATATION & CURETTAGE/HYSTEROSCOPY WITH MYOSURE;  Surgeon: Anastasio Auerbach, MD;  Location: [facility];  Service: Gynecology;  Laterality: N/A;   DILATATION & CURETTAGE/HYSTEROSCOPY WITH MYOSURE N/A 04/11/2018   Procedure: DILATATION & CURETTAGE/HYSTEROSCOPY WITH MYOSURE;  Surgeon: Anastasio Auerbach, MD;  Location: [facility];  Service: Gynecology;  Laterality: N/A;   GYNECOLOGIC CRYOSURGERY  age 34s   TONSILLECTOMY  age 12   TOTAL LAPAROSCOPIC HYSTERECTOMY WITH SALPINGECTOMY Bilateral 04/09/2019   Procedure: TOTAL LAPAROSCOPIC HYSTERECTOMY WITH SALPINGECTOMY;  Surgeon: [name], MD;  Location: [facility];  Service: Gynecology;  Laterality: Bilateral;  Has Mirena IUD in place, 3 hours surgery time.  Extended recovery bed.   TRANSTHORACIC ECHOCARDIOGRAM  01/17/2017   ef 60-65%/  mild TR   Family History Family History  Problem Relation Age of Onset   Pulmonary embolism Mother        Last year   Fibromyalgia Mother    Heart disease Mother        CHF   Congestive Heart Failure Mother    Colon polyps Mother    Heart disease Father 13       CAD   Hyperlipidemia Father    Heart attack Father    Autism Sister    Sleep apnea Brother    Diabetes Paternal Grandmother    Heart attack Paternal Grandmother    Congestive Heart Failure Maternal Aunt    Deep vein thrombosis Maternal Aunt    Mental retardation Maternal Aunt    Cancer Neg Hx     Social History Social History   Tobacco Use   Smoking status: Never   Smokeless tobacco: Never  Vaping Use   Vaping Use: Never used  Substance Use Topics   Alcohol use: Yes    Comment: occasionally   Drug use: No   Allergies Other and Darvon [propoxyphene]  Review of Systems Review of Systems  All other systems reviewed and are negative.   Physical Exam Vital Signs  I have reviewed the triage vital signs BP (!) 166/87   Pulse (!) 103   Temp 99.7 F (37.6 C) (Oral)   Resp (!) 26   LMP 03/05/2019   SpO2 97%  Physical Exam Vitals and nursing note reviewed.  Constitutional:      General: She is not in acute distress.    Appearance: She is well-developed. She is obese.  HENT:     Head: Normocephalic and atraumatic.     Mouth/Throat:     Mouth: Mucous membranes are moist.  Eyes:     Pupils: Pupils are equal, round, and reactive to light.  Cardiovascular:     Rate and Rhythm: Regular rhythm. Tachycardia present.     Heart sounds: No murmur heard. Pulmonary:     Effort: Pulmonary effort is normal. No respiratory distress.     Breath sounds: Normal breath sounds.  Abdominal:     General: Abdomen is flat.     Palpations: Abdomen is soft.     Tenderness: There is no abdominal tenderness.  Musculoskeletal:         General: No tenderness.  Right lower leg: No edema.     Left lower leg: No edema.  Skin:    General: Skin is warm and dry.  Neurological:     General: No focal deficit present.     Mental Status: She is alert. Mental status is at baseline.  Psychiatric:        Mood and Affect: Mood normal.        Behavior: Behavior normal.     ED Results and Treatments Labs (all labs ordered are listed, but only abnormal results are displayed) Labs Reviewed  RESP PANEL BY RT-PCR (FLU A&B, COVID) ARPGX2 - Abnormal; Notable for the following components:      Result Value   SARS Coronavirus 2 by RT PCR POSITIVE (*)    All other components within normal limits  BASIC METABOLIC PANEL - Abnormal; Notable for the following components:   Glucose, Bld 103 (*)    All other components within normal limits  CBC WITH DIFFERENTIAL/PLATELET                                                                                                                          Radiology DG Chest Port 1 View  Result Date: 02/20/2022 CLINICAL DATA:  46 year old female with history of chest pain and cough. EXAM: PORTABLE CHEST 1 VIEW COMPARISON:  Chest x-ray 05/08/2018. FINDINGS: Lung volumes are normal. No consolidative airspace disease. No pleural effusions. No pneumothorax. No pulmonary nodule or mass noted. Pulmonary vasculature and the cardiomediastinal silhouette are within normal limits. IMPRESSION: No radiographic evidence of acute cardiopulmonary disease. Electronically Signed   By: Vinnie Langton M.D.   On: 02/20/2022 10:44    Pertinent labs & imaging results that were available during my care of the patient were reviewed by me and considered in my medical decision making (see MDM for details).  Medications Ordered in ED Medications  sodium chloride 0.9 % bolus 1,000 mL (0 mLs Intravenous Stopped 02/20/22 1120)  acetaminophen (TYLENOL) tablet 1,000 mg (1,000 mg Oral Given 02/20/22 0913)                                                                                                                                      Procedures .1-3 Lead EKG Interpretation  Performed by: Cristie Hem, MD Authorized by: Cristie Hem, MD     Interpretation: abnormal     ECG rate:  105   ECG rate assessment: tachycardic     Rhythm: sinus rhythm     Ectopy: none     Conduction: normal     (including critical care time)  Medical Decision Making / ED Course   MDM:  46 year old female presenting to the emergency department with cough.  Patient overall well-appearing, exam with clear lungs.  Vital signs notable for tachycardia, reviewed monitor tracing, appears to be sinus tachycardia.  EKG appears similar to previous.  Suspect symptoms most likely viral illness, differential also includes bronchitis, pneumonia.  Less likely asthma or COPD exacerbation with no wheezing.  No signs of volume overload on exam.  Symptoms not compatible with pulmonary embolism and patient is already on anticoagulation.  Will give IV fluids for tachycardia and will check basic labs.  Will check flu/COVID swab. Clinical Course as of 02/20/22 1128  Sat Feb 20, 2022  1128 SARS Coronavirus 2 by RT PCR(!): POSITIVE COVID test is positive.  Patient on Eliquis so will discharge with molnupiravir. Will discharge patient to home. All questions answered. Patient comfortable with plan of discharge. Return precautions discussed with patient and specified on the after visit summary.  [WS]    Clinical Course User Index [WS] Cristie Hem, MD     Additional history obtained:  -External records from outside source obtained and reviewed including: Chart review including previous notes, labs, imaging, consultation notes including notes from PE diagnosis in 2018   Lab Tests: -I ordered, reviewed, and interpreted labs.   The pertinent results include:   Labs Reviewed  RESP PANEL BY RT-PCR (FLU A&B, COVID) ARPGX2 - Abnormal;  Notable for the following components:      Result Value   SARS Coronavirus 2 by RT PCR POSITIVE (*)    All other components within normal limits  BASIC METABOLIC PANEL - Abnormal; Notable for the following components:   Glucose, Bld 103 (*)    All other components within normal limits  CBC WITH DIFFERENTIAL/PLATELET    Notable for positive covid test  EKG   EKG Interpretation  Date/Time:  Saturday February 20 2022 08:50:56 EDT Ventricular Rate:  105 PR Interval:  166 QRS Duration: 86 QT Interval:  317 QTC Calculation: 419 R Axis:   -29 Text Interpretation: Sinus tachycardia Inferior infarct, old Consider anterior infarct No significant change since last tracing Confirmed by Garnette Gunner [phone]) on 02/20/2022 9:07:37 AM         Imaging Studies ordered: I ordered imaging studies including CXR On my interpretation imaging demonstrates clear lungs I independently visualized and interpreted imaging. I agree with the radiologist interpretation   Medicines ordered and prescription drug management: Meds ordered this encounter  Medications   sodium chloride 0.9 % bolus 1,000 mL   acetaminophen (TYLENOL) tablet 1,000 mg   molnupiravir EUA (LAGEVRIO) 200 MG CAPS capsule    Sig: Take 4 capsules (800 mg total) by mouth 2 (two) times daily for 5 days.    Dispense:  40 capsule    Refill:  0    -I have reviewed the patients home medicines and have made adjustments as needed   Cardiac Monitoring: The patient was maintained on a cardiac monitor.  I personally viewed and interpreted the cardiac monitored which showed an underlying rhythm of: sinus rhythm/sinus tachycardia  Social Determinants of Health:  Diagnosis or treatment significantly limited by social determinants of health: obesity   Reevaluation: After the interventions noted above, I reevaluated the patient and found that they have improved  Co morbidities that complicate the patient evaluation  Past Medical  History:  Diagnosis Date   Anemia    Anxiety    Arthritis    inflammatory   ASCUS with positive high risk HPV cervical 01/2019   Bladder spasm    COPD (chronic obstructive pulmonary disease) (HCC)    Fibromyalgia    GERD (gastroesophageal reflux disease)    Glaucoma    History of chronic bronchitis    last flare up 11-18-17   History of iron deficiency anemia    History of pulmonary embolism 10/2016--- followed by pcp and pulmoloigst   dx bilateral PE , bilateral lower lobes , segmental , unprovoked, negative hypercoagulable work-up-- treated w/ eliquis   Migraine    Mild intermittent asthma    pulmologist-  dr Elsworth Soho   Mixed connective tissue disease ([place])    Morbid obesity with BMI of 45.0-49.9, adult ([place])    Open-angle glaucoma of both eyes    Pre-diabetes    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    Final Clinical Impression(s) / ED Diagnoses Final diagnoses:  COVID-19     This chart was dictated using voice recognition software.  Despite best efforts to proofread,  errors can occur which can change the documentation meaning.    Cristie Hem, MD 02/20/22 1130

## 2022-02-20 NOTE — ED Triage Notes (Signed)
Pt is on eliquis for PE history, and also reports 2 days of right lower back pain possibly shingle.s

## 2022-03-09 ENCOUNTER — Encounter: Payer: Self-pay | Admitting: Physical Medicine and Rehabilitation

## 2022-03-09 ENCOUNTER — Encounter: Payer: 59 | Attending: Physical Medicine and Rehabilitation | Admitting: Physical Medicine and Rehabilitation

## 2022-03-09 VITALS — BP 104/74 | HR 87 | Ht 70.0 in | Wt 340.0 lb

## 2022-03-09 DIAGNOSIS — M797 Fibromyalgia: Secondary | ICD-10-CM | POA: Insufficient documentation

## 2022-03-09 DIAGNOSIS — G43801 Other migraine, not intractable, with status migrainosus: Secondary | ICD-10-CM | POA: Diagnosis not present

## 2022-03-09 NOTE — Progress Notes (Signed)
Subjective:    Patient ID: Holly Mcdonald, female    DOB: 26-Nov-1975, 46 y.o.   MRN: [id number]  HPI   Holly Mcdonald is a 46 year old woman who presents for follow-up of tremors, pulmonary embolism, morbid obesity, migraines, fibromyalgia, prediabetes, asthma, fibroids, chronic bronchitis presents with fibromyalgia.  Initially stated: Started 10/2016 after diagnosed PE.  Progressively getting worse.  Heat improved the pain.  Fibroids have exacerbated the pain, with surgery planned for 10/17/17.  Activity exacerbates the pain.  All qualities of pain.  Non-radiating.  Constant.  Associated muscle spasms and tingling.  Tylenol does not help.  Cymbalta provides some benefit. Denies falls. Pain limits all activities.   Works as Therapist, art.    Went to [facility] and had a good trip  Stayed with her grandma and caught COVID form her  Her pain has been stable  Her whole family is feeling fine  Getting ready to go to [place] in December  Next year she is going to take it easy and not d any International Trips  Pain has been stable  She recently went on a trip to [place]. She needed wheelchair assistance on the way home but had a great trip.  She got her infusion for her RA this morning and feeling better. Getting these 8 weeks.   She has been doing so much better since she has seen rheumatology. She has some resistance getting there as her initial labs were not positive for RA. 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   She follows with Dr. Jenetta Downer who originally treated her with plaquenil and methotrexate.   For a long time she had resisted going to pain management.   Doing well today.   She forgets that she has a TENS.  She has one in case she needs. She is laying down under her electric blanket. She is using Lidocaine patch, Baclofen rarely.  She is taking Lyrica BID, TID during flares.  Denies falls. She states she is gaining weight.  She follows up with headache  specialist.  She is seeing Rheum and receives infusion.   Muscle relaxers only used ocassionally.   He had shingles recently and pain has now improved  She was on Amitriptyline as a child.   She has left sided hand tremors. She was told to take B12.   Pain Inventory Average Pain 2 Pain Right Now 2 My pain is dull and aching  In the last 24 hours, has pain interfered with the following? General activity 0 Relation with others 0 Enjoyment of life 0 What TIME of day is your pain at its worst? morning, daytime, evening, night Sleep (in general) Good  Pain is worse with: some activites Pain improves with: rest, therapy/exercise, and medication Relief from Meds: 9    Family History  Problem Relation Age of Onset   Pulmonary embolism Mother        Last year   Fibromyalgia Mother    Heart disease Mother        CHF   Congestive Heart Failure Mother    Colon polyps Mother    Heart disease Father 32       CAD   Hyperlipidemia Father    Heart attack Father    Autism Sister    Sleep apnea Brother    Diabetes Paternal Grandmother    Heart attack Paternal Grandmother    Congestive Heart Failure Maternal Aunt    Deep vein thrombosis Maternal Aunt  Mental retardation Maternal Aunt    Cancer Neg Hx    Social History   Socioeconomic History   Marital status: Divorced    Spouse name: Not on file   Number of children: 0   Years of education: Not on file   Highest education level: Not on file  Occupational History   Occupation: representative    Comment: Holly Mcdonald  Tobacco Use   Smoking status: Never   Smokeless tobacco: Never  Vaping Use   Vaping Use: Never used  Substance and Sexual Activity   Alcohol use: Yes    Comment: occasionally   Drug use: No   Sexual activity: Not Currently    Birth control/protection: None    Comment: 1st intercourse 46 yo-More than 5 partners Mirena 09/16/2017  Other Topics Concern   Not on file  Social History Narrative   Not on file    Social Determinants of Health   Financial Resource Strain: Not on file  Food Insecurity: Not on file  Transportation Needs: Not on file  Physical Activity: Not on file  Stress: Not on file  Social Connections: Not on file   Past Surgical History:  Procedure Laterality Date   ANAL EXAMINATION UNDER ANESTHESIA  age 21   w/ removal cyst   COLONOSCOPY WITH PROPOFOL N/A 02/18/2022   Procedure: COLONOSCOPY WITH PROPOFOL;  Surgeon: Doran Stabler, MD;  Location: [facility];  Service: Gastroenterology;  Laterality: N/A;   CYSTOSCOPY N/A 04/09/2019   Procedure: CYSTOSCOPY;  Surgeon: [name], MD;  Location: [facility];  Service: Gynecology;  Laterality: N/A;   DILATATION & CURETTAGE/HYSTEROSCOPY WITH MYOSURE N/A 10/17/2017   Procedure: DILATATION & CURETTAGE/HYSTEROSCOPY WITH MYOSURE;  Surgeon: Anastasio Auerbach, MD;  Location: [facility];  Service: Gynecology;  Laterality: N/A;   DILATATION & CURETTAGE/HYSTEROSCOPY WITH MYOSURE N/A 04/11/2018   Procedure: DILATATION & CURETTAGE/HYSTEROSCOPY WITH MYOSURE;  Surgeon: Anastasio Auerbach, MD;  Location: [facility];  Service: Gynecology;  Laterality: N/A;   GYNECOLOGIC CRYOSURGERY  age 49s   TONSILLECTOMY  age 33   TOTAL LAPAROSCOPIC HYSTERECTOMY WITH SALPINGECTOMY Bilateral 04/09/2019   Procedure: TOTAL LAPAROSCOPIC HYSTERECTOMY WITH SALPINGECTOMY;  Surgeon: [name], MD;  Location: [facility];  Service: Gynecology;  Laterality: Bilateral;  Has Mirena IUD in place, 3 hours surgery time. Extended recovery bed.   TRANSTHORACIC ECHOCARDIOGRAM  01/17/2017   ef 60-65%/  mild TR   Past Medical History:  Diagnosis Date   Anemia    Anxiety    Arthritis    inflammatory   ASCUS with positive high risk HPV cervical 01/2019   Bladder spasm    COPD (chronic obstructive pulmonary disease) (HCC)    Fibromyalgia    GERD (gastroesophageal reflux disease)    Glaucoma    History of chronic bronchitis    last flare  up 11-18-17   History of iron deficiency anemia    History of pulmonary embolism 10/2016--- followed by pcp and pulmoloigst   dx bilateral PE , bilateral lower lobes , segmental , unprovoked, negative hypercoagulable work-up-- treated w/ eliquis   Migraine    Mild intermittent asthma    pulmologist-  dr Elsworth Soho   Mixed connective tissue disease ([facility])    Morbid obesity with BMI of 45.0-49.9, adult ([place] [facility])    Open-angle glaucoma of both eyes    Pre-diabetes    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   bilateral ---- stopped eliquis   Shingles    Sleep apnea    Vitamin  D deficiency    LMP 03/05/2019   Opioid Risk Score:   Fall Risk Score:  `1  Depression screen [facility] 2/9     08/04/2021    9:11 AM 06/09/2020    8:14 AM 12/25/2019    8:50 AM 05/17/2018   10:40 AM 01/12/2018   10:06 AM 10/07/2017   10:28 AM  Depression screen PHQ 2/9  Decreased Interest 0 0 0 0 0 0  Down, Depressed, Hopeless 0 0 0 0 0 0  PHQ - 2 Score 0 0 0 0 0 0  Altered sleeping      1  Tired, decreased energy      3  Change in appetite      3  Feeling bad or failure about yourself       0  Trouble concentrating      0  Moving slowly or fidgety/restless      0  Suicidal thoughts      0  PHQ-9 Score      7  Difficult doing work/chores      Somewhat difficult   Review of Systems  Constitutional: Negative.   HENT: Negative.    Eyes: Negative.   Respiratory:  Positive for apnea.   Cardiovascular: Negative.   Gastrointestinal: Negative.   Endocrine: Negative.   Genitourinary:  Positive for difficulty urinating.       Bladder control  Musculoskeletal:  Positive for arthralgias, back pain, myalgias and neck pain.  Skin: Negative.   Allergic/Immunologic: Negative.   Neurological:  Positive for tremors.       Tingling  Hematological: Negative.   Psychiatric/Behavioral:  The patient is nervous/anxious.   All other systems reviewed and are negative.    Objective:   Physical Exam Gen: no distress, normal appearing, BMI 48.78,  weight 340 lbs HEENT: oral mucosa pink and moist, NCAT Cardio: Reg rate Chest: normal effort, normal rate of breathing Abd: soft, non-distended Ext: no edema Psych: pleasant, normal affect Skin: intact Neuro: Alert an oriented x3. Left sided resting tremor. TTP over right right lumbar spine    Assessment & Plan:  Female with past medical history of tremors, pulmonary embolism, morbid obesity, migraines, fibromyalgia, prediabetes, asthma, fibroids, chronic bronchitis presents with pain all over.    1. Chronic diffuse pain             Multifactorial - Fibromyalgia, connective tissue disorder, inflammatory arthritis, fibroids s/p hysterectomy             Side effects with Gabapentin             Neck xray reviewed, showing mild disc space narrowing at C7-T1.             Continue Heat             Encouraged pool therapy (completed aquatic therapy), reminded again after CoVid.             Cont TENS IT with benefit, reminded             Continue Lidoderm OTC  Discussed trigger point injections and she defers             Refilled Lyrica 75 BID - TID, usually taking twice daily             Refilled Baclofen 10 qhs, unable to tolerate during the day             D/ced Robaxin 500 BID PRN  Cont to follow up with Psychology   -Provided with a pain relief journal and discussed that it contains foods and lifestyle tips to naturally help to improve pain. Discussed that these lifestyle strategies are also very good for health unlike some medications which can have negative side effects. Discussed that the act of keeping a journal can be therapeutic and helpful to realize patterns what helps to trigger and alleviate pain.    Continue '60mg'$  cymbalta  Infrared lamp script given.              Will consider accupuncture if necessary             Will consider NSAID if necessary  Prescribed Pamelor '10mg'$  daily prn for breakthrough pain -Discussed benefits of exercise in reducing pain. -Discussed  following foods that may reduce pain: 1) Ginger (especially studied for arthritis)- reduce leukotriene production to decrease inflammation 2) Blueberries- high in phytonutrients that decrease inflammation 3) Salmon- marine omega-3s reduce joint swelling and pain 4) Pumpkin seeds- reduce inflammation 5) dark chocolate- reduces inflammation 6) turmeric- reduces inflammation 7) tart cherries - reduce pain and stiffness 8) extra virgin olive oil - its compound olecanthal helps to block prostaglandins  9) chili peppers- can be eaten or applied topically via capsaicin 10) mint- helpful for headache, muscle aches, joint pain, and itching 11) garlic- reduces inflammation  Link to further information on diet for chronic pain: [email or web address]                2. Gait abnormality             Continue slow transitional movements   3. Sleep disturbance with OSA             Improved with meds             See #1   4. Morbid Obesity             Encouraged weight loss again   5. Myalgia                     Will consider trigger point injections when able to tolerate             See #1  Not needed at present   6. Migraines            Continue Topamax             See #1.   Foods to alleviate migraine:  1) dark leafy greens 2) avocado 3) tuna 4) samon and mackerel 5) beans and legumes Supplements that can be helpful: feverfew, B12, and magnesium  Foods to avoid in migraine: 1) Excessive (or irregular timing) coffee 2) red wine 3) aged cheeses 4) chocolate 5) citrus fruits 6) aspartame and other artifical sweeteners 7) yeast 8) MSG (in processed foods) 9) processed and cured meats 10) nuts and certain seeds 11) chicken livers and other organ meats 12) dairy products like buttermilk, sour cream, and yogurt 13) dried fruits like dates, figs, and raisins 14) garlic 15) onions 16) potato chips 17) pickled  foods like olives and sauerkraut 18) some fresh fruits like ripe banana, papaya, red plums, raspberries, kiwi, pineapple 19) tomato-based products  Recommend to keep a migraine diary: rate daily the severity of your headache (1-10) and what foods you eat that day to help determine patterns.     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 Tremor: Discussed propanolol but we decided not severe enough now to consider.   9. Vitamin D deficiency: -supplement removed by rheumatology.  -discussed how her level went to 125 in [place].  -discussed how she felt that time.

## 2022-04-04 ENCOUNTER — Other Ambulatory Visit: Payer: Self-pay | Admitting: Physical Medicine and Rehabilitation

## 2022-04-29 ENCOUNTER — Ambulatory Visit: Payer: 59 | Admitting: Allergy

## 2022-04-29 ENCOUNTER — Encounter: Payer: Self-pay | Admitting: Allergy

## 2022-04-29 ENCOUNTER — Ambulatory Visit (INDEPENDENT_AMBULATORY_CARE_PROVIDER_SITE_OTHER): Payer: 59 | Admitting: Allergy

## 2022-04-29 ENCOUNTER — Other Ambulatory Visit: Payer: Self-pay

## 2022-04-29 VITALS — BP 106/70 | HR 74 | Temp 97.6°F | Resp 16 | Ht 70.0 in | Wt 334.8 lb

## 2022-04-29 DIAGNOSIS — J4530 Mild persistent asthma, uncomplicated: Secondary | ICD-10-CM | POA: Diagnosis not present

## 2022-04-29 DIAGNOSIS — J3089 Other allergic rhinitis: Secondary | ICD-10-CM

## 2022-04-29 DIAGNOSIS — H1013 Acute atopic conjunctivitis, bilateral: Secondary | ICD-10-CM

## 2022-04-29 DIAGNOSIS — G43009 Migraine without aura, not intractable, without status migrainosus: Secondary | ICD-10-CM | POA: Diagnosis not present

## 2022-04-29 MED ORDER — CETIRIZINE HCL 10 MG PO TABS: 10.0000 mg | ORAL_TABLET | Freq: Every morning | ORAL | 5 refills | Status: AC

## 2022-04-29 MED ORDER — IPRATROPIUM BROMIDE 0.03 % NA SOLN: 2.0000 | Freq: Three times a day (TID) | NASAL | 5 refills | Status: AC | PRN

## 2022-04-29 MED ORDER — ALBUTEROL SULFATE HFA 108 (90 BASE) MCG/ACT IN AERS: INHALATION_SPRAY | RESPIRATORY_TRACT | 1 refills | Status: DC

## 2022-04-29 MED ORDER — MONTELUKAST SODIUM 10 MG PO TABS: 10.0000 mg | ORAL_TABLET | Freq: Every day | ORAL | 5 refills | Status: DC

## 2022-04-29 MED ORDER — BREO ELLIPTA 200-25 MCG/ACT IN AEPB: 1.0000 | INHALATION_SPRAY | Freq: Every day | RESPIRATORY_TRACT | 5 refills | Status: DC

## 2022-04-29 MED ORDER — MUPIROCIN 2 % EX OINT: TOPICAL_OINTMENT | CUTANEOUS | 1 refills | Status: DC

## 2022-04-29 MED ORDER — FLUTICASONE PROPIONATE 50 MCG/ACT NA SUSP: NASAL | 5 refills | Status: DC

## 2022-04-29 NOTE — Patient Instructions (Addendum)
Allergic rhinitis with conjunctivitis and migraines     - continue avoidance measures for tree pollen, molds, dust mites and cockroach.     - continue use of Cetirizine '10mg'$  daily    - continue Singulair '10mg'$  daily at night    - continue using fluticasone 1-2 sprays each nostril daily as neededfor nasal congestion at this time    - for nasal drainage/throat clearing use nasal Ipratropium 2 sprays each nostril up to 3 times a day as needed.       - use nasal saline spray to help keep nose clean and moisturized    - consider allergen immunotherapy (allergy shots) if medication management becomes ineffective in controlling allergy symptoms.     - for nasal sores use Mupirocin ointment apply to the nose with q-tip twice a day   Asthma, adult-onset    - under good control at this time    - continue Breo 200 mcg 1 puff daily     - have access to Ventolin inhaler 2 puffs every 4-6 hours as needed for cough/wheeze/shortness of breath/chest tightness.  May use 15-20 minutes prior to activity.   Monitor frequency of use.    Asthma control goals:  Full participation in all desired activities (may need albuterol before activity) Albuterol use two time or less a week on average (not counting use with activity) Cough interfering with sleep two time or less a month Oral steroids no more than once a year No hospitalizations   Follow-up 12 months or sooner if needed Call us when we need to transition medications to mail order

## 2022-04-29 NOTE — Progress Notes (Signed)
Follow-up Note  RE: Muskaan Smet MRN: [id number] DOB: Dec 26, 1975 Date of Office Visit: 04/29/2022   History of present illness: Holly Mcdonald is a 46 y.o. female presenting today for follow-up of allergic rhinitis with conjunctivitis, history of migraines and asthma. She was last seen in the office on 10/29/21 by myself.   She went to [place] and [place] for vacations since last visit.   She had covid in October that she caught from her grandmother.  She reports symptoms of coughing that was different than her regular day to day symptoms (ie arthralgias).  She did go to ED for symptoms and tested positive for covid.    She did get treated with the antiviral, molnupiravir.  She states she has a shorter and lesser covid illness than her family members who have not received all the available covid vaccines.  She will be getting the updated covid booster this weekend along with shingles vaccine. She states she didn't use albuterol with the coughing although she states she likely should have.  She continues to use Breo 1 puff daily.   Her allergy symptoms are under control with current regimen of cetirizine, singulair and will use flonase and nasal atrovent for nasal symptoms.  She does report right now she has 2 nasal sores of the left nares.  She has a tube of mupirocin at home and states she needs to use it.    Review of systems: Review of Systems  Constitutional: Negative.   HENT:         See HPI  Eyes: Negative.   Respiratory: Negative.    Cardiovascular: Negative.   Gastrointestinal: Negative.   Musculoskeletal:  Positive for arthralgias and myalgias.  Skin: Negative.   Allergic/Immunologic: Negative.   Neurological: Negative.      All other systems negative unless noted above in HPI  Past medical/social/surgical/family history have been reviewed and are unchanged unless specifically indicated below.  No changes  Medication List: Current Outpatient  Medications  Medication Sig Dispense Refill   albuterol (VENTOLIN HFA) 108 (90 Base) MCG/ACT inhaler INHALE TWO PUFFS BY MOUTH EVERY 4 HOURS AS NEEDED FOR WHEEZING OR SHORTNESS OF BREATH 8.5 g 1   baclofen (LIORESAL) 10 MG tablet Take 1 tablet (10 mg total) by mouth 3 (three) times daily as needed. for muscle spams 90 tablet 5   benzonatate (TESSALON) 100 MG capsule Take 200 mg by mouth 3 (three) times daily as needed for cough.     BREO ELLIPTA 200-25 MCG/INH AEPB INHALE ONE DOSE BY MOUTH DAILY 60 each 5   cetirizine (ZYRTEC) 10 MG tablet Take 1 tablet (10 mg total) by mouth every morning. 30 tablet 5   Cyanocobalamin (VITAMIN B 12 PO) Take 1,000 mg by mouth daily.      DULoxetine (CYMBALTA) 60 MG capsule TAKE ONE CAPSULE BY MOUTH DAILY 30 capsule 2   ELIQUIS 5 MG TABS tablet TAKE ONE TABLET BY MOUTH TWICE A DAY 60 tablet 2   fluticasone (FLONASE) 50 MCG/ACT nasal spray SHAKE LIQUID AND USE 1 SPRAY IN EACH NOSTRIL DAILY--  in am (Patient taking differently: Place 1 spray into both nostrils daily as needed for allergies.) 16 g 5   folic acid (FOLVITE) 1 MG tablet Take 2 mg by mouth daily.      golimumab (SIMPONI ARIA) 50 MG/4ML SOLN injection Inject into the vein every 8 (eight) weeks.     ipratropium (ATROVENT) 0.03 % nasal spray Place 2 sprays into both nostrils  3 (three) times daily as needed (nasal drainage, throat clearing). 30 mL 5   lidocaine (XYLOCAINE) 5 % ointment Apply 1 application topically as needed. 35.44 g 0   methocarbamol (ROBAXIN) 500 MG tablet Take 1 tablet (500 mg total) by mouth 2 (two) times daily as needed for muscle spasms. 60 tablet 1   methotrexate 2.5 MG tablet Take 15 mg by mouth once a week. Friday in the evening     montelukast (SINGULAIR) 10 MG tablet TAKE ONE TABLET BY MOUTH EVERY NIGHT AT BEDTIME 30 tablet 5   mupirocin ointment (BACTROBAN) 2 % PLACE 1 APPLICATION INTO THE NOSE TWO TIMES A DAY FOR 10 DAYS (Patient taking differently: Apply 1 Application topically  daily as needed (ulcers in nose).) 22 g 0   nortriptyline (PAMELOR) 10 MG capsule TAKE ONE CAPSULE BY MOUTH DAILY AS NEEDED FOR SLEEP 30 capsule 3   omeprazole (PRILOSEC) 40 MG capsule TAKE 1 CAPSULE(40 MG) BY MOUTH DAILY 90 capsule 2   oxybutynin (DITROPAN) 5 MG tablet Take 5 mg by mouth 2 (two) times daily.   11   pregabalin (LYRICA) 75 MG capsule TAKE 1 CAPSULE BY MOUTH TWICE A DAY 60 capsule 3   timolol (TIMOPTIC) 0.5 % ophthalmic solution Place 1 drop into both eyes daily.      topiramate (TOPAMAX) 25 MG tablet Take 75 mg by mouth at bedtime.   1   Vitamin D, Ergocalciferol, (DRISDOL) 1.25 MG (50000 UNIT) CAPS capsule TAKE 1 CAPSULE BY MOUTH EVERY 10 DAYS (Patient taking differently: Take 50,000 Units by mouth every Friday.) 3 capsule 4   No current facility-administered medications for this visit.     Known medication allergies: Allergies  Allergen Reactions   Other Nausea And Vomiting    Darvocet N-100 Other reaction(s): Vomiting   Darvon [Propoxyphene] Nausea And Vomiting     Physical examination: Blood pressure 106/70, pulse 74, temperature 97.6 F (36.4 C), temperature source Temporal, resp. rate 16, height '5\' 10"'$  (1.778 m), weight (!) 334 lb 12.8 oz (151.9 kg), last menstrual period 03/05/2019, SpO2 99 %.  General: Alert, interactive, in no acute distress. HEENT: PERRLA, TMs pearly gray, small excoriation at the tip of left nares, turbinates non-edematous without discharge, post-pharynx non erythematous. Neck: Supple without lymphadenopathy. Lungs: Clear to auscultation without wheezing, rhonchi or rales. {no increased work of breathing. CV: Normal S1, S2 without murmurs. Abdomen: Nondistended, nontender. Skin: Warm and dry, without lesions or rashes. Extremities:  No clubbing, cyanosis or edema. Neuro:   Grossly intact.  Diagnositics/Labs: None today  Assessment and plan:   Allergic rhinitis with conjunctivitis with history of migraines     - continue avoidance  measures for tree pollen, molds, dust mites and cockroach.     - continue use of Cetirizine '10mg'$  daily    - continue Singulair '10mg'$  daily at night    - continue using fluticasone 1-2 sprays each nostril daily as neededfor nasal congestion at this time    - for nasal drainage/throat clearing use nasal Ipratropium 2 sprays each nostril up to 3 times a day as needed.       - use nasal saline spray to help keep nose clean and moisturized    - consider allergen immunotherapy (allergy shots) if medication management becomes ineffective in controlling allergy symptoms.     - for nasal sores use Mupirocin ointment apply to the nose with q-tip twice a day   Asthma, adult-onset    - under good control at this time    -  continue Breo 200 mcg 1 puff daily     - have access to Ventolin inhaler 2 puffs every 4-6 hours as needed for cough/wheeze/shortness of breath/chest tightness.  May use 15-20 minutes prior to activity.   Monitor frequency of use.    Asthma control goals:  Full participation in all desired activities (may need albuterol before activity) Albuterol use two time or less a week on average (not counting use with activity) Cough interfering with sleep two time or less a month Oral steroids no more than once a year No hospitalizations   Follow-up 12 months or sooner if needed Call us when we need to transition medications to mail order  I appreciate the opportunity to take part in Holly's care. Please do not hesitate to contact me with questions.  Sincerely,   Prudy Feeler, MD Allergy/Immunology [facility]

## 2022-09-06 ENCOUNTER — Encounter: Payer: 59 | Attending: Physical Medicine and Rehabilitation | Admitting: Physical Medicine and Rehabilitation

## 2022-09-06 VITALS — BP 111/80 | HR 83 | Ht 70.0 in | Wt 316.0 lb

## 2022-09-06 DIAGNOSIS — S93402S Sprain of unspecified ligament of left ankle, sequela: Secondary | ICD-10-CM | POA: Insufficient documentation

## 2022-09-06 DIAGNOSIS — M797 Fibromyalgia: Secondary | ICD-10-CM | POA: Insufficient documentation

## 2022-09-06 NOTE — Patient Instructions (Signed)
Red light lamp

## 2022-09-06 NOTE — Progress Notes (Signed)
Subjective:    Patient ID: Holly Mcdonald, female    DOB: [birth year], 47 y.o.   MRN: [id number]  HPI   [name] is a 47 year old woman who presents for follow-up of tremors, pulmonary embolism, morbid obesity, migraines, fibromyalgia, prediabetes, asthma, fibroids, chronic bronchitis presents with fibromyalgia.  Initially stated: Started 10/2016 after diagnosed PE.  Progressively getting worse.  Heat improved the pain.  Fibroids have exacerbated the pain, with surgery planned for 10/17/17.  Activity exacerbates the pain.  All qualities of pain.  Non-radiating.  Constant.  Associated muscle spasms and tingling.  Tylenol does not help.  Cymbalta provides some benefit. Denies falls. Pain limits all activities.   Works as Clinical biochemist.    Went to [facility] and had a good trip  Stayed with her grandma and caught COVID form her  Her pain has been stable  Her whole family is feeling fine  Getting ready to go to [place] in December  Next year she is going to take it easy and not d any International Trips  Pain has been stable  She recently went on a trip to [place]. She needed wheelchair assistance on the way home but had a great trip.  She got her infusion for her RA this morning and feeling better. Getting these 8 weeks.   She has been doing so much better since she has seen rheumatology. She has some resistance getting there as her initial labs were not positive for RA. 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   She follows with Dr. Kathlen Mody who originally treated her with plaquenil and methotrexate.   For a long time she had resisted going to pain management.   Doing well today.   She forgets that she has a TENS.  She has one in case she needs. She is laying down under her electric blanket. She is using Lidocaine patch, Baclofen rarely.  She is taking Lyrica BID, TID during flares.  Denies falls. She states she is gaining weight.  She follows up with headache  specialist.  She is seeing Rheum and receives infusion.   Muscle relaxers only used ocassionally.   He had shingles recently and pain has now improved  She was on Amitriptyline as a child.   She has left sided hand tremors. She was told to take B12.   Left ankle sprain: -using the boot and planning to transition to her brace -she is starting therapy soon -she has sprained that ankle 24 years ago as well -last time she needed a cast -taking ibuprofen  Pain Inventory Average Pain 2 Pain Right Now 2 My pain is dull and aching  In the last 24 hours, has pain interfered with the following? General activity 0 Relation with others 0 Enjoyment of life 0 What TIME of day is your pain at its worst? morning, daytime, evening, night Sleep (in general) Good  Pain is worse with: some activites Pain improves with: rest, therapy/exercise, and medication Relief from Meds: 9    Family History  Problem Relation Age of Onset   Pulmonary embolism Mother        Last year   Fibromyalgia Mother    Heart disease Mother        CHF   Congestive Heart Failure Mother    Colon polyps Mother    Heart disease Father 44       CAD   Hyperlipidemia Father    Heart attack Father    Autism  Sister    Sleep apnea Brother    Diabetes Paternal Grandmother    Heart attack Paternal Grandmother    Congestive Heart Failure Maternal Aunt    Deep vein thrombosis Maternal Aunt    Mental retardation Maternal Aunt    Cancer Neg Hx    Social History   Socioeconomic History   Marital status: Divorced    Spouse name: Not on file   Number of children: 0   Years of education: Not on file   Highest education level: Not on file  Occupational History   Occupation: representative    Comment: [employer]  Tobacco Use   Smoking status: Never   Smokeless tobacco: Never  Vaping Use   Vaping Use: Never used  Substance and Sexual Activity   Alcohol use: Yes    Comment: occasionally   Drug use: No   Sexual  activity: Not Currently    Birth control/protection: None    Comment: 1st intercourse 47 yo-More than 5 partners Mirena 09/16/2017  Other Topics Concern   Not on file  Social History Narrative   Not on file   Social Determinants of Health   Financial Resource Strain: Not on file  Food Insecurity: Not on file  Transportation Needs: Not on file  Physical Activity: Not on file  Stress: Not on file  Social Connections: Not on file   Past Surgical History:  Procedure Laterality Date   ANAL EXAMINATION UNDER ANESTHESIA  age 89   w/ removal cyst   COLONOSCOPY WITH PROPOFOL N/A 02/18/2022   Procedure: COLONOSCOPY WITH PROPOFOL;  Surgeon: Sherrilyn Rist, MD;  Location: [facility] ENDOSCOPY;  Service: Gastroenterology;  Laterality: N/A;   CYSTOSCOPY N/A 04/09/2019   Procedure: CYSTOSCOPY;  Surgeon: Jerene Bears, MD;  Location: [facility] OR;  Service: Gynecology;  Laterality: N/A;   DILATATION & CURETTAGE/HYSTEROSCOPY WITH MYOSURE N/A 10/17/2017   Procedure: DILATATION & CURETTAGE/HYSTEROSCOPY WITH MYOSURE;  Surgeon: Dara Lords, MD;  Location: [facility];  Service: Gynecology;  Laterality: N/A;   DILATATION & CURETTAGE/HYSTEROSCOPY WITH MYOSURE N/A 04/11/2018   Procedure: DILATATION & CURETTAGE/HYSTEROSCOPY WITH MYOSURE;  Surgeon: Dara Lords, MD;  Location: [facility];  Service: Gynecology;  Laterality: N/A;   GYNECOLOGIC CRYOSURGERY  age 66s   TONSILLECTOMY  age 68   TOTAL LAPAROSCOPIC HYSTERECTOMY WITH SALPINGECTOMY Bilateral 04/09/2019   Procedure: TOTAL LAPAROSCOPIC HYSTERECTOMY WITH SALPINGECTOMY;  Surgeon: Jerene Bears, MD;  Location[facility] OR;  Service: Gynecology;  Laterality: Bilateral;  Has Mirena IUD in place, 3 hours surgery time. Extended recovery bed.   TRANSTHORACIC ECHOCARDIOGRAM  01/17/2017   ef 60-65%/  mild TR   Past Medical History:  Diagnosis Date   Anemia    Anxiety    Arthritis    inflammatory   ASCUS with positive high risk  HPV cervical 01/2019   Bladder spasm    COPD (chronic obstructive pulmonary disease) (HCC)    Fibromyalgia    GERD (gastroesophageal reflux disease)    Glaucoma    History of chronic bronchitis    last flare up 11-18-17   History of iron deficiency anemia    History of pulmonary embolism 10/2016--- followed by pcp and pulmoloigst   dx bilateral PE , bilateral lower lobes , segmental , unprovoked, negative hypercoagulable work-up-- treated w/ eliquis   Migraine    Mild intermittent asthma    pulmologist-  dr Vassie Loll   Mixed connective tissue disease (HCC)    Morbid obesity with BMI of  45.0-49.9, adult (HCC)    Open-angle glaucoma of both eyes    Pre-diabetes    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   bilateral ---- stopped eliquis   Shingles    Sleep apnea    Vitamin D deficiency    BP 111/80   Pulse 83   Ht 5\' 10"  (1.778 m)   Wt (!) 316 lb (143.3 kg)   LMP 03/05/2019   SpO2 96%   BMI 45.34 kg/m   Opioid Risk Score:   Fall Risk Score:  `1  Depression screen [facility] 2/9     09/06/2022    9:36 AM 03/09/2022    9:54 AM 08/04/2021    9:11 AM 06/09/2020    8:14 AM 12/25/2019    8:50 AM 05/17/2018   10:40 AM 01/12/2018   10:06 AM  Depression screen PHQ 2/9  Decreased Interest 0 0 0 0 0 0 0  Down, Depressed, Hopeless 0 0 0 0 0 0 0  PHQ - 2 Score 0 0 0 0 0 0 0   Review of Systems  Constitutional: Negative.   HENT: Negative.    Eyes: Negative.   Respiratory:  Positive for apnea.   Cardiovascular: Negative.   Gastrointestinal: Negative.   Endocrine: Negative.   Genitourinary:  Positive for difficulty urinating.       Bladder control  Musculoskeletal:  Positive for arthralgias, back pain, myalgias and neck pain.  Skin: Negative.   Allergic/Immunologic: Negative.   Neurological:  Positive for tremors.       Tingling  Hematological: Negative.   Psychiatric/Behavioral:  The patient is nervous/anxious.   All other systems reviewed and are negative.     Objective:   Physical Exam Gen:  no distress, normal appearing, BMI 45.34, weight 316 lbs HEENT: oral mucosa pink and moist, NCAT Cardio: Reg rate Chest: normal effort, normal rate of breathing Abd: soft, non-distended Ext: no edema Psych: pleasant, normal affect Skin: intact Neuro: Alert an oriented x3. Left sided resting tremor. TTP over right right lumbar spine    Assessment & Plan:  Female with past medical history of tremors, pulmonary embolism, morbid obesity, migraines, fibromyalgia, prediabetes, asthma, fibroids, chronic bronchitis presents with pain all over.    1. Chronic diffuse pain             Multifactorial - Fibromyalgia, connective tissue disorder, inflammatory arthritis, fibroids s/p hysterectomy             Side effects with Gabapentin  -discussed that condition has been stable             Neck xray reviewed, showing mild disc space narrowing at C7-T1.             Continue Heat             Encouraged pool therapy (completed aquatic therapy), reminded again after CoVid.             Cont TENS IT with benefit, reminded             Continue Lidoderm OTC  Discussed trigger point injections and she defers             Continue Lyrica 75 BID - TID, usually taking twice daily             Refilled Baclofen 10 qhs, unable to tolerate during the day             D/ced Robaxin 500 BID PRN  Cont to follow up with Psychology   -Provided with a pain relief journal and discussed that it contains foods and lifestyle tips to naturally help to improve pain. Discussed that these lifestyle strategies are also very good for health unlike some medications which can have negative side effects. Discussed that the act of keeping a journal can be therapeutic and helpful to realize patterns what helps to trigger and alleviate pain.    Continue 60mg  cymbalta  Infrared lamp script given.              Will consider accupuncture if necessary             Will consider NSAID if necessary  Prescribed Pamelor 10mg  daily prn  for breakthrough pain -Discussed benefits of exercise in reducing pain. -Discussed following foods that may reduce pain: 1) Ginger (especially studied for arthritis)- reduce leukotriene production to decrease inflammation 2) Blueberries- high in phytonutrients that decrease inflammation 3) Salmon- marine omega-3s reduce joint swelling and pain 4) Pumpkin seeds- reduce inflammation 5) dark chocolate- reduces inflammation 6) turmeric- reduces inflammation 7) tart cherries - reduce pain and stiffness 8) extra virgin olive oil - its compound olecanthal helps to block prostaglandins  9) chili peppers- can be eaten or applied topically via capsaicin 10) mint- helpful for headache, muscle aches, joint pain, and itching 11) garlic- reduces inflammation  Link to further information on diet for chronic pain: [email or web address].[email or web address]                2. Gait abnormality             Continue slow transitional movements   3. Sleep disturbance with OSA             Improved with meds             See #1   4. Morbid Obesity             Encouraged weight loss again   5. Myalgia                     Will consider trigger point injections when able to tolerate             See #1  Not needed at present   6. Migraines            Continue Topamax             See #1.   Foods to alleviate migraine:  1) dark leafy greens 2) avocado 3) tuna 4) samon and mackerel 5) beans and legumes Supplements that can be helpful: feverfew, B12, and magnesium  Foods to avoid in migraine: 1) Excessive (or irregular timing) coffee 2) red wine 3) aged cheeses 4) chocolate 5) citrus fruits 6) aspartame and other artifical sweeteners 7) yeast 8) MSG (in processed foods) 9) processed and cured meats 10) nuts and certain seeds 11) chicken livers and other organ meats 12) dairy products like buttermilk, sour cream, and yogurt 13) dried fruits like  dates, figs, and raisins 14) garlic 15) onions 16) potato chips 17) pickled foods like olives and sauerkraut 18) some fresh fruits like ripe banana, papaya, red plums, raspberries, kiwi, pineapple 19) tomato-based products  Recommend to keep a migraine diary: rate daily the severity of your headache (1-10) and what foods you eat that day to help determine patterns.     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 Tremor: Discussed propanolol but we decided not severe enough now to consider.   9. Vitamin D deficiency: -supplement removed by rheumatology.  -discussed how her level went to 125 in [place].  -discussed how she felt that time.  -discussed that her PCP will check this.   10. Left ankle sprain: -continue ibuprofen prn -discussed red light therapy.   11. Obesity: -encouraged increased protein, water intake -discussed [employer]

## 2022-10-14 ENCOUNTER — Ambulatory Visit (HOSPITAL_BASED_OUTPATIENT_CLINIC_OR_DEPARTMENT_OTHER): Payer: 59 | Admitting: Obstetrics & Gynecology

## 2022-10-22 ENCOUNTER — Other Ambulatory Visit (HOSPITAL_COMMUNITY)
Admission: RE | Admit: 2022-10-22 | Discharge: 2022-10-22 | Disposition: A | Discharge status: Home / Self Care | Payer: 59 | Source: Ambulatory Visit | Attending: Obstetrics & Gynecology | Admitting: Obstetrics & Gynecology

## 2022-10-22 ENCOUNTER — Ambulatory Visit (INDEPENDENT_AMBULATORY_CARE_PROVIDER_SITE_OTHER): Payer: 59 | Admitting: Obstetrics & Gynecology

## 2022-10-22 ENCOUNTER — Encounter (HOSPITAL_BASED_OUTPATIENT_CLINIC_OR_DEPARTMENT_OTHER): Payer: Self-pay | Admitting: Obstetrics & Gynecology

## 2022-10-22 VITALS — BP 112/86 | HR 96 | Ht 70.0 in | Wt 312.0 lb

## 2022-10-22 DIAGNOSIS — Z01419 Encounter for gynecological examination (general) (routine) without abnormal findings: Secondary | ICD-10-CM | POA: Diagnosis not present

## 2022-10-22 DIAGNOSIS — Z113 Encounter for screening for infections with a predominantly sexual mode of transmission: Secondary | ICD-10-CM | POA: Insufficient documentation

## 2022-10-22 DIAGNOSIS — R32 Unspecified urinary incontinence: Secondary | ICD-10-CM

## 2022-10-22 DIAGNOSIS — Z86711 Personal history of pulmonary embolism: Secondary | ICD-10-CM | POA: Diagnosis not present

## 2022-10-22 DIAGNOSIS — B977 Papillomavirus as the cause of diseases classified elsewhere: Secondary | ICD-10-CM

## 2022-10-22 DIAGNOSIS — Z90710 Acquired absence of both cervix and uterus: Secondary | ICD-10-CM

## 2022-10-22 NOTE — Progress Notes (Signed)
47 y.o. G0P0000 Divorced Black or [place] American female here for annual exam.  Denies vaginal bleeding.  Doing well.  Has been working on weight loss.  Has two dogs now and is walking very regularly with her dogs.  Patient's last menstrual period was 03/05/2019.          Sexually active: Yes.    The current method of family planning is status post hysterectomy.    Exercising: Yes.     Smoker:  no  Health Maintenance: Pap:  neg pap and neg HR HPV 2022 and 2023.   History of abnormal Pap:  h/o +HR HPV prior to hysterectomy MMG:  December at [facility] per pt.  Will call for report.   Colonoscopy:  02/2022, Dr. Myrtie Neither, follow up 10 years Screening Labs: done with Dr. Thomasena Edis last July   reports that she has never smoked. She has never used smokeless tobacco. She reports current alcohol use. She reports that she does not use drugs.  Past Medical History:  Diagnosis Date   Anemia    Anxiety    Arthritis    inflammatory   ASCUS with positive high risk HPV cervical 01/2019   Bladder spasm    COPD (chronic obstructive pulmonary disease) (HCC)    Fibromyalgia    GERD (gastroesophageal reflux disease)    Glaucoma    History of chronic bronchitis    last flare up 11-18-17   History of iron deficiency anemia    History of pulmonary embolism 10/2016--- followed by pcp and pulmoloigst   dx bilateral PE , bilateral lower lobes , segmental , unprovoked, negative hypercoagulable work-up-- treated w/ eliquis   Migraine    Mild intermittent asthma    pulmologist-  dr Vassie Loll   Mixed connective tissue disease (HCC)    Morbid obesity with BMI of 45.0-49.9, adult (HCC)    Open-angle glaucoma of both eyes    Pre-diabetes    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   bilateral ---- stopped eliquis   Shingles    Sleep apnea    Vitamin D deficiency     Past Surgical History:  Procedure Laterality Date   ANAL EXAMINATION UNDER ANESTHESIA  age 47   w/ removal cyst   COLONOSCOPY WITH PROPOFOL N/A 02/18/2022    Procedure: COLONOSCOPY WITH PROPOFOL;  Surgeon: Sherrilyn Rist, MD;  Location: [facility] ENDOSCOPY;  Service: Gastroenterology;  Laterality: N/A;   CYSTOSCOPY N/A 04/09/2019   Procedure: CYSTOSCOPY;  Surgeon: Jerene Bears, MD;  Location: [facility] OR;  Service: Gynecology;  Laterality: N/A;   DILATATION & CURETTAGE/HYSTEROSCOPY WITH MYOSURE N/A 10/17/2017   Procedure: DILATATION & CURETTAGE/HYSTEROSCOPY WITH MYOSURE;  Surgeon: Dara Lords, MD;  Location: [facility];  Service: Gynecology;  Laterality: N/A;   DILATATION & CURETTAGE/HYSTEROSCOPY WITH MYOSURE N/A 04/11/2018   Procedure: DILATATION & CURETTAGE/HYSTEROSCOPY WITH MYOSURE;  Surgeon: Dara Lords, MD;  Location:  SURGERY CENTER;  Service: Gynecology;  Laterality: N/A;   GYNECOLOGIC CRYOSURGERY  age 16s   TONSILLECTOMY  age 23   TOTAL LAPAROSCOPIC HYSTERECTOMY WITH SALPINGECTOMY Bilateral 04/09/2019   Procedure: TOTAL LAPAROSCOPIC HYSTERECTOMY WITH SALPINGECTOMY;  Surgeon: Jerene Bears, MD;  Location: [facility] OR;  Service: Gynecology;  Laterality: Bilateral;  Has Mirena IUD in place, 3 hours surgery time. Extended recovery bed.   TRANSTHORACIC ECHOCARDIOGRAM  01/17/2017   ef 60-65%/  mild TR    Current Outpatient Medications  Medication Sig Dispense Refill   albuterol (VENTOLIN HFA) 108 (90 Base) MCG/ACT inhaler INHALE TWO  PUFFS BY MOUTH EVERY 4 HOURS AS NEEDED FOR WHEEZING OR SHORTNESS OF BREATH 18 g 1   baclofen (LIORESAL) 10 MG tablet Take 1 tablet (10 mg total) by mouth 3 (three) times daily as needed. for muscle spams 90 tablet 5   benzonatate (TESSALON) 100 MG capsule Take 200 mg by mouth 3 (three) times daily as needed for cough.     BREO ELLIPTA 200-25 MCG/ACT AEPB Inhale 1 puff into the lungs daily. 28 each 5   cetirizine (ZYRTEC) 10 MG tablet Take 1 tablet (10 mg total) by mouth every morning. 30 tablet 5   Cyanocobalamin (VITAMIN B 12 PO) Take 1,000 mg by mouth daily.      DULoxetine (CYMBALTA) 60  MG capsule TAKE ONE CAPSULE BY MOUTH DAILY 30 capsule 2   ELIQUIS 5 MG TABS tablet TAKE ONE TABLET BY MOUTH TWICE A DAY 60 tablet 2   fluticasone (FLONASE) 50 MCG/ACT nasal spray 1-2 sprays per nostril daily as needed for nasal congestion. 16 g 5   folic acid (FOLVITE) 1 MG tablet Take 2 mg by mouth daily.      golimumab (SIMPONI ARIA) 50 MG/4ML SOLN injection Inject into the vein every 8 (eight) weeks.     ipratropium (ATROVENT) 0.03 % nasal spray Place 2 sprays into both nostrils 3 (three) times daily as needed (nasal drainage, throat clearing). 30 mL 5   lidocaine (XYLOCAINE) 5 % ointment Apply 1 application topically as needed. 35.44 g 0   methocarbamol (ROBAXIN) 500 MG tablet Take 1 tablet (500 mg total) by mouth 2 (two) times daily as needed for muscle spasms. 60 tablet 1   methotrexate 2.5 MG tablet Take 15 mg by mouth once a week. Friday in the evening     montelukast (SINGULAIR) 10 MG tablet Take 1 tablet (10 mg total) by mouth at bedtime. 30 tablet 5   mupirocin ointment (BACTROBAN) 2 % PLACE 1 APPLICATION INTO THE NOSE TWO TIMES A DAY FOR 10 DAYS Strength: 2 % 22 g 1   nortriptyline (PAMELOR) 10 MG capsule TAKE ONE CAPSULE BY MOUTH DAILY AS NEEDED FOR SLEEP 30 capsule 3   omeprazole (PRILOSEC) 40 MG capsule TAKE 1 CAPSULE(40 MG) BY MOUTH DAILY 90 capsule 2   oxybutynin (DITROPAN) 5 MG tablet Take 5 mg by mouth 2 (two) times daily.   11   pregabalin (LYRICA) 75 MG capsule TAKE 1 CAPSULE BY MOUTH TWICE A DAY 60 capsule 3   timolol (TIMOPTIC) 0.5 % ophthalmic solution Place 1 drop into both eyes daily.      topiramate (TOPAMAX) 25 MG tablet Take 75 mg by mouth at bedtime.   1   Vitamin D, Ergocalciferol, (DRISDOL) 1.25 MG (50000 UNIT) CAPS capsule TAKE 1 CAPSULE BY MOUTH EVERY 10 DAYS (Patient taking differently: Take 50,000 Units by mouth every Friday.) 3 capsule 4   No current facility-administered medications for this visit.    Family History  Problem Relation Age of Onset    Pulmonary embolism Mother        Last year   Fibromyalgia Mother    Heart disease Mother        CHF   Congestive Heart Failure Mother    Colon polyps Mother    Heart disease Father 46       CAD   Hyperlipidemia Father    Heart attack Father    Autism Sister    Sleep apnea Brother    Diabetes Paternal Grandmother    Heart attack  Paternal Grandmother    Congestive Heart Failure Maternal Aunt    Deep vein thrombosis Maternal Aunt    Mental retardation Maternal Aunt    Cancer Neg Hx     ROS: Constitutional: negative Genitourinary:negative  Exam:   BP 112/86 (BP Location: Right Arm, Patient Position: Sitting, Cuff Size: Large)   Pulse 96   Ht 5\' 10"  (1.778 m) Comment: reported  Wt (!) 312 lb (141.5 kg)   LMP 03/05/2019   BMI 44.77 kg/m   Height: 5\' 10"  (177.8 cm) (reported)  General appearance: alert, cooperative and appears stated age Head: Normocephalic, without obvious abnormality, atraumatic Neck: no adenopathy, supple, symmetrical, trachea midline and thyroid normal to inspection and palpation Lungs: clear to auscultation bilaterally Breasts: normal appearance, no masses or tenderness Heart: regular rate and rhythm Abdomen: soft, non-tender; bowel sounds normal; no masses,  no organomegaly Extremities: extremities normal, atraumatic, no cyanosis or edema Skin: Skin color, texture, turgor normal. No rashes or lesions Lymph nodes: Cervical, supraclavicular, and axillary nodes normal. No abnormal inguinal nodes palpated Neurologic: Grossly normal  Pelvic: External genitalia:  no lesions              Urethra:  normal appearing urethra with no masses, tenderness or lesions              Bartholins and Skenes: normal                 Vagina: normal appearing vagina with normal color and no discharge, no lesions              Cervix: absent              Pap taken: Yes.   Bimanual Exam:  Uterus:  uterus absent              Adnexa: no mass, fullness, tenderness                Rectovaginal: Confirms               Anus:  normal sphincter tone, no lesions  Chaperone, Ina Homes, CMA, was present for exam.  Assessment/Plan: 1. Well woman exam with routine gynecological exam - Pap smear neg with neg HR HPV 2022 and 2023 - Mammogram 04/2022 per pt.  Will call for results. - Colonoscopy 02/2022.  Follow up 10 years - lab work done with PCP, Dr. Thomasena Edis - vaccines reviewed/updated  2. Obesity, Class III, BMI 40-49.9 (morbid obesity) (HCC) - is actively losing weight  3. Incontinence of urine in female - on ditropan  4. History of pulmonary embolus (PE) - on long term eliquis  5. High risk HPV infection  6. H/O: hysterectomy   7.  STD screening - GC/CHl/trich testing obtained today - HIV, RPR, Hep B and C obtained today

## 2022-10-23 LAB — HEPATITIS C ANTIBODY: Hep C Virus Ab: NONREACTIVE

## 2022-10-23 LAB — RPR+HBSAG+HIV
HIV Screen 4th Generation wRfx: NONREACTIVE
Hepatitis B Surface Ag: NEGATIVE
RPR Ser Ql: NONREACTIVE

## 2022-10-25 LAB — CERVICOVAGINAL ANCILLARY ONLY
Chlamydia: NEGATIVE
Comment: NEGATIVE
Comment: NEGATIVE
Comment: NORMAL
Neisseria Gonorrhea: NEGATIVE
Trichomonas: NEGATIVE

## 2022-10-30 ENCOUNTER — Emergency Department (HOSPITAL_BASED_OUTPATIENT_CLINIC_OR_DEPARTMENT_OTHER): Payer: 59 | Admitting: Radiology

## 2022-10-30 ENCOUNTER — Other Ambulatory Visit: Payer: Self-pay

## 2022-10-30 ENCOUNTER — Encounter (HOSPITAL_BASED_OUTPATIENT_CLINIC_OR_DEPARTMENT_OTHER): Payer: Self-pay | Admitting: Emergency Medicine

## 2022-10-30 DIAGNOSIS — Z7952 Long term (current) use of systemic steroids: Secondary | ICD-10-CM | POA: Insufficient documentation

## 2022-10-30 DIAGNOSIS — J4520 Mild intermittent asthma, uncomplicated: Secondary | ICD-10-CM | POA: Diagnosis not present

## 2022-10-30 DIAGNOSIS — J449 Chronic obstructive pulmonary disease, unspecified: Secondary | ICD-10-CM | POA: Diagnosis not present

## 2022-10-30 DIAGNOSIS — M25552 Pain in left hip: Secondary | ICD-10-CM | POA: Diagnosis present

## 2022-10-30 LAB — BASIC METABOLIC PANEL
Anion gap: 9 (ref 5–15)
BUN: 15 mg/dL (ref 6–20)
CO2: 24 mmol/L (ref 22–32)
Calcium: 9.7 mg/dL (ref 8.9–10.3)
Chloride: 106 mmol/L (ref 98–111)
Creatinine, Ser: 0.65 mg/dL (ref 0.44–1.00)
GFR, Estimated: >60 mL/min (ref >60)
Glucose, Bld: 96 mg/dL (ref 70–99)
Potassium: 3.9 mmol/L (ref 3.5–5.1)
Sodium: 139 mmol/L (ref 135–145)

## 2022-10-30 LAB — CBC
HCT: 41.8 % (ref 36.0–46.0)
Hemoglobin: 13.9 g/dL (ref 12.0–15.0)
MCH: 29.3 pg (ref 26.0–34.0)
MCHC: 33.3 g/dL (ref 30.0–36.0)
MCV: 88.2 fL (ref 80.0–100.0)
Platelets: 234 10*3/uL (ref 150–400)
RBC: 4.74 MIL/uL (ref 3.87–5.11)
RDW: 12.8 % (ref 11.5–15.5)
WBC: 8.1 10*3/uL (ref 4.0–10.5)
nRBC: 0 % (ref 0.0–0.2)

## 2022-10-30 LAB — PROTIME-INR
INR: 1 (ref 0.8–1.2)
Prothrombin Time: 13.5 seconds (ref 11.4–15.2)

## 2022-10-30 LAB — D-DIMER, QUANTITATIVE: D-Dimer, Quant: <0.27 ug/mL-FEU (ref 0.00–0.50)

## 2022-10-30 NOTE — ED Triage Notes (Signed)
Pt c/o left hip pain since a week ago with recent left ankle sprain. Pt has hx DVT  and says current discomfort feels similar to her previous blood clot in 2018. Pt takes eliquis daily

## 2022-10-31 ENCOUNTER — Emergency Department (HOSPITAL_BASED_OUTPATIENT_CLINIC_OR_DEPARTMENT_OTHER)
Admission: EM | Admit: 2022-10-31 | Discharge: 2022-10-31 | Disposition: A | Discharge status: Home / Self Care | Payer: 59 | Attending: Emergency Medicine | Admitting: Emergency Medicine

## 2022-10-31 DIAGNOSIS — M25552 Pain in left hip: Secondary | ICD-10-CM

## 2022-10-31 MED ORDER — PREDNISONE 50 MG PO TABS: 50.0000 mg | ORAL_TABLET | Freq: Every evening | ORAL | 0 refills | Status: AC

## 2022-10-31 MED ORDER — PREDNISONE 50 MG PO TABS
60.0000 mg | ORAL_TABLET | Freq: Once | ORAL | Status: AC
  Administered 2022-10-31: 60 mg via ORAL
  Filled 2022-10-31: qty 1

## 2022-10-31 NOTE — ED Provider Notes (Signed)
[place] EMERGENCY DEPARTMENT AT [facility] Provider Note   CSN: [id number] Arrival date & time: 10/30/22  2026     History  Chief Complaint  Patient presents with   Hip Pain    Holly Mcdonald is a 47 y.o. female.  The history is provided by the patient.   Patient presents with left hip pain.  Patient reports this has been ongoing for the past week.  No recent falls.  She does report a ankle sprain several months ago.  She is concerned that she may have a recurrent DVT.  She reports previous DVT in her right leg first began with right hip pain.  No chest pain or shortness of breath.  She is supposed to take Eliquis, but has missed recent doses.  She reports she is able to afford her medicines, she has just not taken them   Past Medical History:  Diagnosis Date   Anemia    Anxiety    Arthritis    inflammatory   ASCUS with positive high risk HPV cervical 01/2019   Bladder spasm    COPD (chronic obstructive pulmonary disease) (HCC)    Fibromyalgia    GERD (gastroesophageal reflux disease)    Glaucoma    History of chronic bronchitis    last flare up 11-18-17   History of iron deficiency anemia    History of pulmonary embolism 10/2016--- followed by pcp and pulmoloigst   dx bilateral PE , bilateral lower lobes , segmental , unprovoked, negative hypercoagulable work-up-- treated w/ eliquis   Migraine    Mild intermittent asthma    pulmologist-  dr Vassie Loll   Mixed connective tissue disease (HCC)    Morbid obesity with BMI of 45.0-49.9, adult (HCC)    Open-angle glaucoma of both eyes    Pre-diabetes    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   bilateral ---- stopped eliquis   Shingles    Sleep apnea    Vitamin D deficiency     Home Medications Prior to Admission medications   Medication Sig Start Date End Date Taking? Authorizing Provider  predniSONE (DELTASONE) 50 MG tablet Take 1 tablet (50 mg total) by mouth every evening. 10/31/22  Yes Zadie Rhine, MD   albuterol (VENTOLIN HFA) 108 (90 Base) MCG/ACT inhaler INHALE TWO PUFFS BY MOUTH EVERY 4 HOURS AS NEEDED FOR WHEEZING OR SHORTNESS OF BREATH 04/29/22   Padgett, Pilar Grammes, MD  baclofen (LIORESAL) 10 MG tablet Take 1 tablet (10 mg total) by mouth 3 (three) times daily as needed. for muscle spams 03/03/21   Raulkar, Drema Pry, MD  benzonatate (TESSALON) 100 MG capsule Take 200 mg by mouth 3 (three) times daily as needed for cough.    [provider]  BREO ELLIPTA 200-25 MCG/ACT AEPB Inhale 1 puff into the lungs daily. 04/29/22   Marcelyn Bruins, MD  cetirizine (ZYRTEC) 10 MG tablet Take 1 tablet (10 mg total) by mouth every morning. 04/29/22   Marcelyn Bruins, MD  Cyanocobalamin (VITAMIN B 12 PO) Take 1,000 mg by mouth daily.     [provider]  DULoxetine (CYMBALTA) 60 MG capsule TAKE ONE CAPSULE BY MOUTH DAILY 03/16/21   Raulkar, Drema Pry, MD  ELIQUIS 5 MG TABS tablet TAKE ONE TABLET BY MOUTH TWICE A DAY 09/26/19   Malachy Mood, MD  fluticasone [facility]) 50 MCG/ACT nasal spray 1-2 sprays per nostril daily as needed for nasal congestion. 04/29/22   Marcelyn Bruins, MD  folic acid (FOLVITE) 1 MG tablet Take  2 mg by mouth daily.     [provider]  golimumab (SIMPONI ARIA) 50 MG/4ML SOLN injection Inject into the vein every 8 (eight) weeks.    [provider]  ipratropium (ATROVENT) 0.03 % nasal spray Place 2 sprays into both nostrils 3 (three) times daily as needed (nasal drainage, throat clearing). 04/29/22   Marcelyn Bruins, MD  lidocaine (XYLOCAINE) 5 % ointment Apply 1 application topically as needed. 02/21/21   Achille Rich, PA-C  methocarbamol (ROBAXIN) 500 MG tablet Take 1 tablet (500 mg total) by mouth 2 (two) times daily as needed for muscle spasms. 02/08/18   Marcello Fennel, MD  methotrexate 2.5 MG tablet Take 15 mg by mouth once a week. Friday in the evening    [provider]  montelukast  (SINGULAIR) 10 MG tablet Take 1 tablet (10 mg total) by mouth at bedtime. 04/29/22   Marcelyn Bruins, MD  mupirocin ointment (BACTROBAN) 2 % PLACE 1 APPLICATION INTO THE NOSE TWO TIMES A DAY FOR 10 DAYS Strength: 2 % 04/29/22   Padgett, Pilar Grammes, MD  nortriptyline (PAMELOR) 10 MG capsule TAKE ONE CAPSULE BY MOUTH DAILY AS NEEDED FOR SLEEP 07/09/21   Raulkar, Drema Pry, MD  omeprazole (PRILOSEC) 40 MG capsule TAKE 1 CAPSULE(40 MG) BY MOUTH DAILY 04/27/19   [place], Betty G, MD  oxybutynin (DITROPAN) 5 MG tablet Take 5 mg by mouth 2 (two) times daily.  12/28/16   [provider]  pregabalin (LYRICA) 75 MG capsule TAKE 1 CAPSULE BY MOUTH TWICE A DAY 04/05/22   Raulkar, Drema Pry, MD  timolol (TIMOPTIC) 0.5 % ophthalmic solution Place 1 drop into both eyes daily.     [provider]  topiramate (TOPAMAX) 25 MG tablet Take 75 mg by mouth at bedtime.  05/26/17   [provider]  Vitamin D, Ergocalciferol, (DRISDOL) 1.25 MG (50000 UNIT) CAPS capsule TAKE 1 CAPSULE BY MOUTH EVERY 10 DAYS Patient taking differently: Take 50,000 Units by mouth every Friday. 11/20/19   [place], Betty G, MD      Allergies    Other and Darvon [propoxyphene]    Review of Systems   Review of Systems  Constitutional:  Negative for fever.  Respiratory:  Negative for shortness of breath.   Cardiovascular:  Negative for chest pain.  Musculoskeletal:  Positive for arthralgias.    Physical Exam Updated Vital Signs BP 122/89   Pulse 67   Temp 98 F (36.7 C) (Oral)   Resp 18   Ht 1.778 m (5\' 10" )   Wt (!) 141.5 kg   LMP 03/05/2019   SpO2 100%   BMI 44.77 kg/m  Physical Exam CONSTITUTIONAL: Well developed/well nourished HEAD: Normocephalic/atraumatic ENMT: Mucous membranes moist NECK: supple no meningeal signs SPINE/BACK:entire spine nontender CV: S1/S2 noted, no murmurs/rubs/gallops noted LUNGS: Lungs are clear to auscultation bilaterally, no apparent distress ABDOMEN: soft,  nontender NEURO: Pt is awake/alert/appropriate, moves all extremitiesx4.  No facial droop.   EXTREMITIES: pulses normal/equal, full ROM Lower extremities are symmetric.  Distal pulses equal and intact.  There is no significant edema.  No erythema, no bruising or crepitus.  Full range of motion of both left hip and left knee with only mild pain. Nurse present for exam SKIN: warm, color normal PSYCH: no abnormalities of mood noted, alert and oriented to situation  ED Results / Procedures / Treatments   Labs (all labs ordered are listed, but only abnormal results are displayed) Labs Reviewed  CBC  BASIC METABOLIC  PANEL  D-DIMER, QUANTITATIVE  PROTIME-INR    EKG None  Radiology DG Hip Unilat With Pelvis 2-3 Views Left  Result Date: 10/30/2022 CLINICAL DATA:  Left hip pain since a week ago with recent left ankle sprain. EXAM: DG HIP (WITH OR WITHOUT PELVIS) 2-3V LEFT COMPARISON:  CT abdomen and pelvis 02/21/2021 FINDINGS: No acute fracture or dislocation. Degenerative changes pubic symphysis, both hips, SI joints and lower lumbar spine. IMPRESSION: No acute fracture or dislocation. Electronically Signed   By: Minerva Fester M.D.   On: 10/30/2022 22:56    Procedures Procedures    Medications Ordered in ED Medications  predniSONE (DELTASONE) tablet 60 mg (60 mg Oral Given 10/31/22 0052)    ED Course/ Medical Decision Making/ A&P                             Medical Decision Making Amount and/or Complexity of Data Reviewed Labs: ordered. Radiology: ordered.  Risk Prescription drug management.   This patient presents to the ED for concern of hip pain, this involves an extensive number of treatment options, and is a complaint that carries with it a high risk of complications and morbidity.  The differential diagnosis includes but is not limited to muscle strain, hip fracture, hip dislocation, DVT, septic joint  Comorbidities that complicate the patient evaluation: Patient's  presentation is complicated by their history of previous DVT, fibromyalgia  Social Determinants of Health: Patient's  nonadherence to medications   increases the complexity of managing their presentation   Lab Tests: I Ordered, and personally interpreted labs.  The pertinent results include: Labs overall unremarkable  Imaging Studies ordered: I ordered imaging studies including X-ray left hip   I independently visualized and interpreted imaging which showed no acute findings I agree with the radiologist interpretation  Test Considered: I considered DVT study, but patient had negative D-dimer.  Complexity of problems addressed: Patient's presentation is most consistent with  acute complicated illness/injury requiring diagnostic workup  Disposition: After consideration of the diagnostic results and the patient's response to treatment,  I feel that the patent would benefit from discharge   .    Patient presented with atraumatic left hip pain.  X-rays negative.  No signs of septic joint or any injury.  Patient concerned about DVT, but D-dimer is unremarkable.  Will defer further workup.  I advised that she must restart her Eliquis as soon as possible as this would be the treatment if she had a DVT. She denies any chest pain or shortness of breath to suggest PE. Patient requests course of prednisone as she reports a history of RA and this will typically help with joint pain       Final Clinical Impression(s) / ED Diagnoses Final diagnoses:  Left hip pain    Rx / DC Orders ED Discharge Orders          Ordered    predniSONE (DELTASONE) 50 MG tablet  Every evening        10/31/22 0044              Zadie Rhine, MD 10/31/22 0104

## 2023-03-08 ENCOUNTER — Encounter: Payer: 59 | Admitting: Physical Medicine and Rehabilitation

## 2023-03-15 ENCOUNTER — Encounter: Payer: 59 | Attending: Physical Medicine and Rehabilitation | Admitting: Physical Medicine and Rehabilitation

## 2023-03-15 VITALS — BP 137/85 | HR 91 | Ht 70.0 in | Wt 301.4 lb

## 2023-03-15 DIAGNOSIS — G43801 Other migraine, not intractable, with status migrainosus: Secondary | ICD-10-CM | POA: Diagnosis present

## 2023-03-15 DIAGNOSIS — M797 Fibromyalgia: Secondary | ICD-10-CM | POA: Diagnosis not present

## 2023-03-15 MED ORDER — PREGABALIN 75 MG PO CAPS: 75.0000 mg | ORAL_CAPSULE | Freq: Two times a day (BID) | ORAL | 3 refills | Status: DC

## 2023-03-15 MED ORDER — TOPIRAMATE 100 MG PO TABS
100.0000 mg | ORAL_TABLET | Freq: Every evening | ORAL | 3 refills | Status: DC
Start: 2023-03-15 — End: 2023-09-12

## 2023-03-15 MED ORDER — METHOCARBAMOL 500 MG PO TABS: 500.0000 mg | ORAL_TABLET | Freq: Two times a day (BID) | ORAL | 1 refills | Status: AC | PRN

## 2023-03-15 MED ORDER — BACLOFEN 10 MG PO TABS: 10.0000 mg | ORAL_TABLET | Freq: Three times a day (TID) | ORAL | 5 refills | Status: DC | PRN

## 2023-03-15 NOTE — Progress Notes (Signed)
Subjective:    Patient ID: Holly Mcdonald, female    DOB: 1976/03/16, 47 y.o.   MRN: [id number]  HPI   Holly Mcdonald is a 47 year old woman who presents for follow-up of tremors, pulmonary embolism, morbid obesity, migraines, fibromyalgia, prediabetes, asthma, fibroids, chronic bronchitis presents with fibromyalgia.  Initially stated: Started 10/2016 after diagnosed PE.  Progressively getting worse.  Heat improved the pain.  Fibroids have exacerbated the pain, with surgery planned for 10/17/17.  Activity exacerbates the pain.  All qualities of pain.  Non-radiating.  Constant.  Associated muscle spasms and tingling.  Tylenol does not help.  Cymbalta provides some benefit. Denies falls. Pain limits all activities.   -she is having a current spinal flare   -the month of October was not a good month as she had multiple deaths in the family  Works as Clinical biochemist.    Went to [facility] and had a good trip  Stayed with her grandma and caught COVID form her  Her pain has been stable  Her whole family is feeling fine  Getting ready to go to [place] in December  Next year she is going to take it easy and not d any International Trips  Pain has been stable  She recently went on a trip to [place]. She needed wheelchair assistance on the way home but had a great trip.  She got her infusion for her RA this morning and feeling better. Getting these 8 weeks.   She has been doing so much better since she has seen rheumatology. She has some resistance getting there as her initial labs were not positive for RA. 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   She follows with Dr. Kathlen Mody who originally treated her with plaquenil and methotrexate.   For a long time she had resisted going to pain management.   Needs refill of baclofen, robaxin  She forgets that she has a TENS.  She has one in case she needs. She is laying down under her electric blanket. She is using Lidocaine patch,  Baclofen rarely.  She is taking Lyrica BID, TID during flares.  Denies falls. She states she is gaining weight.  She follows up with headache specialist.  She is seeing Rheum and receives infusion.   Muscle relaxers only used ocassionally.   He had shingles recently and pain has now improved  She was on Amitriptyline as a child.   She has left sided hand tremors. She was told to take B12.   Left ankle sprain: -using the boot and planning to transition to her brace -she is starting therapy soon -she has sprained that ankle 24 years ago as well -last time she needed a cast -taking ibuprofen  Pain Inventory Average Pain 3 Pain Right Now 5 My pain is dull and aching  In the last 24 hours, has pain interfered with the following? General activity 2 Relation with others 2 Enjoyment of life 2 What TIME of day is your pain at its worst? morning, daytime, evening, night Sleep (in general) Good  Pain is worse with: walking, bending, sitting, inactivity, standing, and some activites Pain improves with: rest, heat/ice, therapy/exercise, medication, and TENS Relief from Meds: 9    Family History  Problem Relation Age of Onset   Pulmonary embolism Mother        Last year   Fibromyalgia Mother    Heart disease Mother        CHF   Congestive Heart  Failure Mother    Colon polyps Mother    Heart disease Father 39       CAD   Hyperlipidemia Father    Heart attack Father    Autism Sister    Sleep apnea Brother    Diabetes Paternal Grandmother    Heart attack Paternal Grandmother    Congestive Heart Failure Maternal Aunt    Deep vein thrombosis Maternal Aunt    Mental retardation Maternal Aunt    Cancer Neg Hx    Social History   Socioeconomic History   Marital status: Divorced    Spouse name: Not on file   Number of children: 0   Years of education: Not on file   Highest education level: Not on file  Occupational History   Occupation: representative    Comment: [employer]   Tobacco Use   Smoking status: Never   Smokeless tobacco: Never  Vaping Use   Vaping status: Never Used  Substance and Sexual Activity   Alcohol use: Yes    Comment: occasionally   Drug use: No   Sexual activity: Not Currently    Birth control/protection: None    Comment: 1st intercourse 47 yo-More than 5 partners Mirena 09/16/2017  Other Topics Concern   Not on file  Social History Narrative   Not on file   Social Determinants of Health   Financial Resource Strain: Not on file  Food Insecurity: Not on file  Transportation Needs: Not on file  Physical Activity: Not on file  Stress: Not on file  Social Connections: Not on file   Past Surgical History:  Procedure Laterality Date   ANAL EXAMINATION UNDER ANESTHESIA  age 63   w/ removal cyst   COLONOSCOPY WITH PROPOFOL N/A 02/18/2022   Procedure: COLONOSCOPY WITH PROPOFOL;  Surgeon: Sherrilyn Rist, MD;  Location: [facility] ENDOSCOPY;  Service: Gastroenterology;  Laterality: N/A;   CYSTOSCOPY N/A 04/09/2019   Procedure: CYSTOSCOPY;  Surgeon: Jerene Bears, MD;  Location: [facility] OR;  Service: Gynecology;  Laterality: N/A;   DILATATION & CURETTAGE/HYSTEROSCOPY WITH MYOSURE N/A 10/17/2017   Procedure: DILATATION & CURETTAGE/HYSTEROSCOPY WITH MYOSURE;  Surgeon: Dara Lords, MD;  Location: [facility];  Service: Gynecology;  Laterality: N/A;   DILATATION & CURETTAGE/HYSTEROSCOPY WITH MYOSURE N/A 04/11/2018   Procedure: DILATATION & CURETTAGE/HYSTEROSCOPY WITH MYOSURE;  Surgeon: Dara Lords, MD;  Location: [facility];  Service: Gynecology;  Laterality: N/A;   GYNECOLOGIC CRYOSURGERY  age 37s   TONSILLECTOMY  age 35   TOTAL LAPAROSCOPIC HYSTERECTOMY WITH SALPINGECTOMY Bilateral 04/09/2019   Procedure: TOTAL LAPAROSCOPIC HYSTERECTOMY WITH SALPINGECTOMY;  Surgeon: Jerene Bears, MD;  Location: [facility] OR;  Service: Gynecology;  Laterality: Bilateral;  Has Mirena IUD in place, 3 hours surgery time. Extended  recovery bed.   TRANSTHORACIC ECHOCARDIOGRAM  01/17/2017   ef 60-65%/  mild TR   Past Medical History:  Diagnosis Date   Anemia    Anxiety    Arthritis    inflammatory   ASCUS with positive high risk HPV cervical 01/2019   Bladder spasm    COPD (chronic obstructive pulmonary disease) (HCC)    Fibromyalgia    GERD (gastroesophageal reflux disease)    Glaucoma    History of chronic bronchitis    last flare up 11-18-17   History of iron deficiency anemia    History of pulmonary embolism 10/2016--- followed by pcp and pulmoloigst   dx bilateral PE , bilateral lower lobes , segmental , unprovoked, negative  hypercoagulable work-up-- treated w/ eliquis   Migraine    Mild intermittent asthma    pulmologist-  dr Vassie Loll   Mixed connective tissue disease (HCC)    Morbid obesity with BMI of 45.0-49.9, adult (HCC)    Open-angle glaucoma of both eyes    Pre-diabetes    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   bilateral ---- stopped eliquis   Shingles    Sleep apnea    Vitamin D deficiency    BP 137/85   Pulse 91   Ht 5\' 10"  (1.778 m)   Wt (!) 301 lb 6.4 oz (136.7 kg)   LMP 03/05/2019   SpO2 95%   BMI 43.25 kg/m   Opioid Risk Score:   Fall Risk Score:  `1  Depression screen [place] 2/9     10/22/2022    9:01 AM 09/06/2022    9:36 AM 03/09/2022    9:54 AM 08/04/2021    9:11 AM 06/09/2020    8:14 AM 12/25/2019    8:50 AM 05/17/2018   10:40 AM  Depression screen PHQ 2/9  Decreased Interest 0 0 0 0 0 0 0  Down, Depressed, Hopeless 0 0 0 0 0 0 0  PHQ - 2 Score 0 0 0 0 0 0 0   Review of Systems  Constitutional: Negative.   HENT: Negative.    Eyes: Negative.   Respiratory:  Positive for apnea.   Cardiovascular: Negative.   Gastrointestinal: Negative.   Endocrine: Negative.   Genitourinary:  Positive for difficulty urinating.       Bladder control  Musculoskeletal:  Positive for arthralgias, back pain, myalgias and neck pain.  Skin: Negative.   Allergic/Immunologic: Negative.    Neurological:  Positive for tremors.       Tingling  Hematological: Negative.   Psychiatric/Behavioral:  The patient is nervous/anxious.   All other systems reviewed and are negative.     Objective:   Physical Exam Gen: no distress, normal appearing, BMI 45.25, weight 301 lbs.  HEENT: oral mucosa pink and moist, NCAT Cardio: Reg rate Chest: normal effort, normal rate of breathing Abd: soft, non-distended Ext: no edema Psych: pleasant, normal affect Skin: intact Neuro: Alert an oriented x3. No tremor. TTP over right right lumbar spine, diffuse tender points on upper and lower sides of the body    Assessment & Plan:  Female with past medical history of tremors, pulmonary embolism, morbid obesity, migraines, fibromyalgia, prediabetes, asthma, fibroids, chronic bronchitis presents with pain all over.    1. Chronic diffuse pain             Multifactorial - Fibromyalgia, connective tissue disorder, inflammatory arthritis, fibroids s/p hysterectomy             Side effects with Gabapentin  -discussed that condition has been stable             Neck xray reviewed, showing mild disc space narrowing at C7-T1.             Continue Heat             Encouraged pool therapy (completed aquatic therapy), reminded again after CoVid.             Cont TENS IT with benefit, reminded             Continue Lidoderm OTC  Discussed trigger point injections and she defers             Continue Lyrica 75 BID - TID, usually taking twice daily  Refilled Baclofen 10 qhs, unable to tolerate during the day             Refilled Robaxin 500 BID PRN             Cont to follow up with Psychology    Prescribing Home Zynex NexWave Stimulator Device and supplies as needed. IFC, NMES and TENS medically necessary Treatment Rx: Daily @ 30-40 minutes per treatment PRN. Zynex NexWave only, no substitutions. Treatment Goals: 1) To reduce and/or eliminate pain 2) To improve functional capacity and Activities of  daily living 3) To reduce or prevent the need for oral medications 4) To improve circulation in the injured region 5) To decrease or prevent muscle spasm and muscle atrophy 6) To provide a self-management tool to the patient The patient has not sufficiently improved with conservative care. Numerous studies indexed by Medline and PubMed.gov have shown Neuromuscular, Interferential, and TENS stimulators to reduce pain, improve function, and reduce medication use in injured patients. Continued use of this evidence based, safe, drug free treatment is both reasonable and medically necessary at this time.    -Provided with a pain relief journal and discussed that it contains foods and lifestyle tips to naturally help to improve pain. Discussed that these lifestyle strategies are also very good for health unlike some medications which can have negative side effects. Discussed that the act of keeping a journal can be therapeutic and helpful to realize patterns what helps to trigger and alleviate pain.    -Discussed Qutenza as an option for neuropathic pain control. Discussed that this is a capsaicin patch, stronger than capsaicin cream. Discussed that it is currently approved for diabetic peripheral neuropathy and post-herpetic neuralgia, but that it has also shown benefit in treating other forms of neuropathy. Provided patient with link to site to learn more about the patch: https://www.clark.biz/. Discussed that the patch would be placed in office and benefits usually last 3 months. Discussed that unintended exposure to capsaicin can cause severe irritation of eyes, mucous membranes, respiratory tract, and skin, but that Qutenza is a local treatment and does not have the systemic side effects of other nerve medications. Discussed that there may be pain, itching, erythema, and decreased sensory function associated with the application of Qutenza. Side effects usually subside within 1 week. A cold pack of analgesic  medications can help with these side effects. Blood pressure can also be increased due to pain associated with administration of the patch.   Continue 60mg  cymbalta  Infrared lamp script given.              Will consider accupuncture if necessary             Will consider NSAID if necessary  Prescribed Pamelor 10mg  daily prn for breakthrough pain -Discussed benefits of exercise in reducing pain. -Discussed following foods that may reduce pain: 1) Ginger (especially studied for arthritis)- reduce leukotriene production to decrease inflammation 2) Blueberries- high in phytonutrients that decrease inflammation 3) Salmon- marine omega-3s reduce joint swelling and pain 4) Pumpkin seeds- reduce inflammation 5) dark chocolate- reduces inflammation 6) turmeric- reduces inflammation 7) tart cherries - reduce pain and stiffness 8) extra virgin olive oil - its compound olecanthal helps to block prostaglandins  9) chili peppers- can be eaten or applied topically via capsaicin 10) mint- helpful for headache, muscle aches, joint pain, and itching 11) garlic- reduces inflammation  Link to further information on diet for chronic pain: http://www.bray.com/  2. Gait abnormality             Continue slow transitional movements   3. Sleep disturbance with OSA             Improved with meds             See #1   4. Morbid Obesity             Encouraged weight loss again  Increase topamax 100mg  HS  Commended on 15 lb weight loss!  Discussed that she has been watching her calories, walking her dog, discussed that she is prioritizing protein, discussed that she has been eating yogurt, cottage cheese, almonds   5. Myalgia                     Will consider trigger point injections when able to tolerate             See #1  Not needed at present   6. Migraines           ContinueTopamax             See #1.   Foods to  alleviate migraine:  1) dark leafy greens 2) avocado 3) tuna 4) samon and mackerel 5) beans and legumes Supplements that can be helpful: feverfew, B12, and magnesium  Foods to avoid in migraine: 1) Excessive (or irregular timing) coffee 2) red wine 3) aged cheeses 4) chocolate 5) citrus fruits 6) aspartame and other artifical sweeteners 7) yeast 8) MSG (in processed foods) 9) processed and cured meats 10) nuts and certain seeds 11) chicken livers and other organ meats 12) dairy products like buttermilk, sour cream, and yogurt 13) dried fruits like dates, figs, and raisins 14) garlic 15) onions 16) potato chips 17) pickled foods like olives and sauerkraut 18) some fresh fruits like ripe banana, papaya, red plums, raspberries, kiwi, pineapple 19) tomato-based products  Recommend to keep a migraine diary: rate daily the severity of your headache (1-10) and what foods you eat that day to help determine patterns.     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 Tremor: Discussed propanolol but we decided not severe enough now to consider.   9. Vitamin D deficiency: -supplement removed by rheumatology.  -discussed how her level went to 125 in [place].  -discussed how she felt that time.  -discussed that her PCP will check this.   10. Left ankle sprain: -continue ibuprofen prn -discussed red light therapy.

## 2023-03-17 ENCOUNTER — Telehealth: Payer: Self-pay | Admitting: *Deleted

## 2023-03-17 NOTE — Telephone Encounter (Signed)
[name] called and reports that Holly Mcdonald is not in network with her [facility] insurance.

## 2023-04-27 ENCOUNTER — Emergency Department (HOSPITAL_BASED_OUTPATIENT_CLINIC_OR_DEPARTMENT_OTHER)
Admission: EM | Admit: 2023-04-27 | Discharge: 2023-04-27 | Disposition: A | Discharge status: Home / Self Care | Payer: 59 | Attending: Emergency Medicine | Admitting: Emergency Medicine

## 2023-04-27 ENCOUNTER — Encounter (HOSPITAL_BASED_OUTPATIENT_CLINIC_OR_DEPARTMENT_OTHER): Payer: Self-pay | Admitting: Emergency Medicine

## 2023-04-27 ENCOUNTER — Encounter: Payer: Self-pay | Admitting: Hematology

## 2023-04-27 ENCOUNTER — Other Ambulatory Visit: Payer: Self-pay

## 2023-04-27 DIAGNOSIS — R519 Headache, unspecified: Secondary | ICD-10-CM | POA: Diagnosis present

## 2023-04-27 DIAGNOSIS — Z7901 Long term (current) use of anticoagulants: Secondary | ICD-10-CM | POA: Diagnosis not present

## 2023-04-27 DIAGNOSIS — U071 COVID-19: Secondary | ICD-10-CM | POA: Diagnosis not present

## 2023-04-27 LAB — SARS CORONAVIRUS 2 BY RT PCR: SARS Coronavirus 2 by RT PCR: POSITIVE — AB

## 2023-04-27 MED ORDER — BENZONATATE 100 MG PO CAPS: 100.0000 mg | ORAL_CAPSULE | Freq: Three times a day (TID) | ORAL | 0 refills | Status: AC

## 2023-04-27 NOTE — ED Provider Notes (Signed)
[place] EMERGENCY DEPARTMENT AT [facility]  Provider Note  CSN: [id number] Arrival date & time: 04/27/23 0006  History Chief Complaint  Patient presents with   Sore Throat   Headache    Holly Mcdonald is a 47 y.o. female with history of multiple medical problems including RA, PE on lifelong Eliquis returned from a trip to [place] earlier tonight, reports she had onset of URI symptoms in the morning. No fever. She has had Covid in the past.    Home Medications Prior to Admission medications   Medication Sig Start Date End Date Taking? Authorizing Provider  benzonatate (TESSALON) 100 MG capsule Take 1 capsule (100 mg total) by mouth every 8 (eight) hours. 04/27/23  Yes Pollyann Savoy, MD  albuterol (VENTOLIN HFA) 108 (90 Base) MCG/ACT inhaler INHALE TWO PUFFS BY MOUTH EVERY 4 HOURS AS NEEDED FOR WHEEZING OR SHORTNESS OF BREATH 04/29/22   Marcelyn Bruins, MD  baclofen (LIORESAL) 10 MG tablet Take 1 tablet (10 mg total) by mouth 3 (three) times daily as needed. for muscle spams 03/15/23   Raulkar, Drema Pry, MD  BREO ELLIPTA 200-25 MCG/ACT AEPB Inhale 1 puff into the lungs daily. 04/29/22   Marcelyn Bruins, MD  brimonidine-timolol (COMBIGAN) 0.2-0.5 % ophthalmic solution Apply to eye. 02/25/23   [provider]  cetirizine (ZYRTEC) 10 MG tablet Take 1 tablet (10 mg total) by mouth every morning. 04/29/22   Marcelyn Bruins, MD  Cyanocobalamin (VITAMIN B 12 PO) Take 1,000 mg by mouth daily.     [provider]  cyclobenzaprine (FLEXERIL) 10 MG tablet Take 10 mg by mouth 3 (three) times daily. 03/02/23   [provider]  DULoxetine (CYMBALTA) 60 MG capsule TAKE ONE CAPSULE BY MOUTH DAILY 03/16/21   Raulkar, Drema Pry, MD  ELIQUIS 5 MG TABS tablet TAKE ONE TABLET BY MOUTH TWICE A DAY 09/26/19   Malachy Mood, MD  fluticasone [facility]) 50 MCG/ACT nasal spray 1-2 sprays per nostril daily as needed for nasal congestion.  04/29/22   Marcelyn Bruins, MD  folic acid (FOLVITE) 1 MG tablet Take 2 mg by mouth daily.     [provider]  golimumab (SIMPONI ARIA) 50 MG/4ML SOLN injection Inject into the vein every 8 (eight) weeks.    [provider]  ipratropium (ATROVENT) 0.03 % nasal spray Place 2 sprays into both nostrils 3 (three) times daily as needed (nasal drainage, throat clearing). 04/29/22   Marcelyn Bruins, MD  lidocaine (XYLOCAINE) 5 % ointment Apply 1 application topically as needed. 02/21/21   Achille Rich, PA-C  methocarbamol (ROBAXIN) 500 MG tablet Take 1 tablet (500 mg total) by mouth 2 (two) times daily as needed for muscle spasms. 03/15/23   Raulkar, Drema Pry, MD  methotrexate 2.5 MG tablet Take 15 mg by mouth once a week. Friday in the evening    [provider]  montelukast (SINGULAIR) 10 MG tablet Take 1 tablet (10 mg total) by mouth at bedtime. 04/29/22   Marcelyn Bruins, MD  mupirocin ointment (BACTROBAN) 2 % PLACE 1 APPLICATION INTO THE NOSE TWO TIMES A DAY FOR 10 DAYS Strength: 2 % 04/29/22   Padgett, Pilar Grammes, MD  nortriptyline (PAMELOR) 10 MG capsule TAKE ONE CAPSULE BY MOUTH DAILY AS NEEDED FOR SLEEP 07/09/21   Raulkar, Drema Pry, MD  omeprazole (PRILOSEC) 40 MG capsule TAKE 1 CAPSULE(40 MG) BY MOUTH DAILY 04/27/19   [place], Betty G, MD  oxybutynin (DITROPAN) 5 MG tablet Take  5 mg by mouth 2 (two) times daily.  12/28/16   [provider]  predniSONE (DELTASONE) 20 MG tablet Take 20 mg by mouth 2 (two) times daily. 03/02/23   [provider]  predniSONE (DELTASONE) 50 MG tablet Take 1 tablet (50 mg total) by mouth every evening. 10/31/22   Zadie Rhine, MD  pregabalin (LYRICA) 75 MG capsule Take 1 capsule (75 mg total) by mouth 2 (two) times daily. 03/15/23   Raulkar, Drema Pry, MD  timolol (TIMOPTIC) 0.5 % ophthalmic solution Place 1 drop into both eyes daily.     [provider]  topiramate (TOPAMAX) 100  MG tablet Take 1 tablet (100 mg total) by mouth at bedtime. 03/15/23   Raulkar, Drema Pry, MD  Vitamin D, Ergocalciferol, (DRISDOL) 1.25 MG (50000 UNIT) CAPS capsule TAKE 1 CAPSULE BY MOUTH EVERY 10 DAYS Patient taking differently: Take 50,000 Units by mouth every Friday. 11/20/19   [place], Betty G, MD     Allergies    Other and Darvon [propoxyphene]   Review of Systems   Review of Systems Please see HPI for pertinent positives and negatives  Physical Exam BP 122/87 (BP Location: Left Arm)   Pulse 100   Temp 99.5 F (37.5 C) (Oral)   Resp 20   LMP 03/05/2019   SpO2 100%   Physical Exam Vitals and nursing note reviewed.  HENT:     Head: Normocephalic.     Nose: Nose normal.  Eyes:     Extraocular Movements: Extraocular movements intact.  Pulmonary:     Effort: Pulmonary effort is normal.  Musculoskeletal:        General: Normal range of motion.     Cervical back: Neck supple.  Skin:    Findings: No rash (on exposed skin).  Neurological:     Mental Status: She is alert and oriented to person, place, and time.  Psychiatric:        Mood and Affect: Mood normal.     ED Results / Procedures / Treatments   EKG None  Procedures Procedures  Medications Ordered in the ED Medications - No data to display  Initial Impression and Plan  Patient here with URI symptoms, Covid test is positive. Unfortunately, despite being a high risk patient, she cannot take Paxlovid while on Eliquis. Her vitals are reassuring now. Recommend she take OTC symptomatic medications, isolate from others and PCP follow up, RTED for any other concerns. She has requested Rx for Tessalon as well.    ED Course       MDM Rules/Calculators/A&P Medical Decision Making Problems Addressed: COVID-19: acute illness or injury  Amount and/or Complexity of Data Reviewed Labs: ordered. Decision-making details documented in ED Course.  Risk Prescription drug management.     Final Clinical  Impression(s) / ED Diagnoses Final diagnoses:  COVID-19    Rx / DC Orders ED Discharge Orders          Ordered    benzonatate (TESSALON) 100 MG capsule  Every 8 hours        04/27/23 0445             Pollyann Savoy, MD 04/27/23 [phone]

## 2023-04-27 NOTE — ED Triage Notes (Signed)
Patient presents with headache, sore throat and generalized malaise. States "I think I got exposed to covid". Patient reports she just returned to [place] from [place].

## 2023-09-10 ENCOUNTER — Other Ambulatory Visit: Payer: Self-pay | Admitting: Physical Medicine and Rehabilitation

## 2023-09-12 ENCOUNTER — Encounter: Payer: 59 | Attending: Physical Medicine and Rehabilitation | Admitting: Physical Medicine and Rehabilitation

## 2023-09-12 ENCOUNTER — Encounter: Payer: Self-pay | Admitting: Physical Medicine and Rehabilitation

## 2023-09-12 VITALS — BP 101/69 | HR 78 | Ht 70.0 in | Wt 318.6 lb

## 2023-09-12 DIAGNOSIS — M069 Rheumatoid arthritis, unspecified: Secondary | ICD-10-CM | POA: Insufficient documentation

## 2023-09-12 DIAGNOSIS — G894 Chronic pain syndrome: Secondary | ICD-10-CM | POA: Insufficient documentation

## 2023-09-12 DIAGNOSIS — E66813 Obesity, class 3: Secondary | ICD-10-CM | POA: Insufficient documentation

## 2023-09-12 DIAGNOSIS — S93402S Sprain of unspecified ligament of left ankle, sequela: Secondary | ICD-10-CM | POA: Insufficient documentation

## 2023-09-12 MED ORDER — TOPIRAMATE 100 MG PO TABS: 100.0000 mg | ORAL_TABLET | Freq: Every evening | ORAL | 3 refills | Status: AC

## 2023-09-12 MED ORDER — PREGABALIN 75 MG PO CAPS: 75.0000 mg | ORAL_CAPSULE | Freq: Two times a day (BID) | ORAL | 3 refills | Status: AC

## 2023-09-12 NOTE — Patient Instructions (Addendum)
 [place] Protocol  Foods that may reduce pain: 1) Ginger (especially studied for arthritis)- reduce leukotriene production to decrease inflammation 2) Blueberries- high in phytonutrients that decrease inflammation 3) Salmon- marine omega-3s reduce joint swelling and pain 4) Pumpkin seeds- reduce inflammation 5) dark chocolate- reduces inflammation 6) turmeric- reduces inflammation 7) tart cherries - reduce pain and stiffness 8) extra virgin olive oil - its compound olecanthal helps to block prostaglandins  9) chili peppers- can be eaten or applied topically via capsaicin 10) mint- helpful for headache, muscle aches, joint pain, and itching 11) garlic- reduces inflammation 12) Green tea- reduces inflammation and oxidative stress, helps with weight loss, may reduce the risk of cancer, recommend Double Green Matcha [place] of Tea daily  Link to further information on diet for chronic pain: http://www.bray.com/

## 2023-09-12 NOTE — Progress Notes (Signed)
 Subjective:    Patient ID: Holly Mcdonald, female    DOB: [birth year], 48 y.o.   MRN: [id number]  HPI   1) Chronic pain: Holly Mcdonald is a 48 year old woman who presents for follow-up of tremors, pulmonary embolism, morbid obesity, migraines, fibromyalgia, prediabetes, asthma, fibroids, chronic bronchitis presents with fibromyalgia.  Initially stated: Started 10/2016 after diagnosed PE.  Progressively getting worse.  Heat improved the pain.  Fibroids have exacerbated the pain, with surgery planned for 10/17/17.  Activity exacerbates the pain.  All qualities of pain.  Non-radiating.  Constant.  Associated muscle spasms and tingling.  Tylenol  does not help.  Cymbalta  provides some benefit. Denies falls. Pain limits all activities.   -pain has been stable  -the month of October was not a good month as she had multiple deaths in the family  Works as Clinical biochemist.    Went to [facility] and had a good trip  Stayed with her grandma and caught COVID form her  Her pain has been stable  Her whole family is feeling fine  Getting ready to go to [place] in December  Next year she is going to take it easy and not d any International Trips  Pain has been stable  She recently went on a trip to [place]. She needed wheelchair assistance on the way home but had a great trip.  She got her infusion for her RA this morning and feeling better. Getting these 8 weeks.   She has been doing so much better since she has seen rheumatology. She has some resistance getting there as her initial labs were not positive for RA. 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   She follows with Dr. Cay Cocking who originally treated her with plaquenil  and methotrexate.   For a long time she had resisted going to pain management.   Needs refill of baclofen , robaxin   She forgets that she has a TENS.  She has one in case she needs. She is laying down under her electric blanket. She is using Lidocaine  patch,  Baclofen  rarely.  She is taking Lyrica  BID, TID during flares.  Denies falls. She states she is gaining weight.  She follows up with headache specialist.  She is seeing Rheum and receives infusion.   Muscle relaxers only used ocassionally.   He had shingles recently and pain has now improved  She was on Amitriptyline as a child.   She has left sided hand tremors. She was told to take B12.   Left ankle sprain: -using the boot and planning to transition to her brace -she is starting therapy soon -she has sprained that ankle 24 years ago as well -last time she needed a cast -taking ibuprofen   RA -when her weight is down the cost of her RA medication decreases  Obesity: -she gained 10 lbs over the winter -she is still overall 40 lbs down -she has been trying to watch what she eats -she walks her dogs  Pain Inventory Average Pain 3 Pain Right Now 5 My pain is dull and aching  In the last 24 hours, has pain interfered with the following? General activity 2 Relation with others 2 Enjoyment of life 2 What TIME of day is your pain at its worst? morning, daytime, evening, night Sleep (in general) Good  Pain is worse with: walking, bending, sitting, inactivity, standing, and some activites Pain improves with: rest, heat/ice, therapy/exercise, medication, and TENS Relief from Meds: 9    Family History  Problem Relation Age of Onset   Pulmonary embolism Mother        Last year   Fibromyalgia Mother    Heart disease Mother        CHF   Congestive Heart Failure Mother    Colon polyps Mother    Heart disease Father 25       CAD   Hyperlipidemia Father    Heart attack Father    Autism Sister    Sleep apnea Brother    Diabetes Paternal Grandmother    Heart attack Paternal Grandmother    Congestive Heart Failure Maternal Aunt    Deep vein thrombosis Maternal Aunt    Mental retardation Maternal Aunt    Cancer Neg Hx    Social History   Socioeconomic History   Marital  status: Divorced    Spouse name: Not on file   Number of children: 0   Years of education: Not on file   Highest education level: Not on file  Occupational History   Occupation: representative    Comment: [employer]  Tobacco Use   Smoking status: Never   Smokeless tobacco: Never  Vaping Use   Vaping status: Never Used  Substance and Sexual Activity   Alcohol use: Yes    Comment: occasionally   Drug use: No   Sexual activity: Not Currently    Birth control/protection: None    Comment: 1st intercourse 48 yo-More than 5 partners Mirena  09/16/2017  Other Topics Concern   Not on file  Social History Narrative   Not on file   Social Drivers of Health   Financial Resource Strain: Not on file  Food Insecurity: Not on file  Transportation Needs: Not on file  Physical Activity: Not on file  Stress: Not on file  Social Connections: Not on file   Past Surgical History:  Procedure Laterality Date   ANAL EXAMINATION UNDER ANESTHESIA  age 75   w/ removal cyst   COLONOSCOPY WITH PROPOFOL  N/A 02/18/2022   Procedure: COLONOSCOPY WITH PROPOFOL ;  Surgeon: Albertina Hugger, MD;  Location: [facility] ENDOSCOPY;  Service: Gastroenterology;  Laterality: N/A;   CYSTOSCOPY N/A 04/09/2019   Procedure: CYSTOSCOPY;  Surgeon: Lillian Rein, MD;  Location: [facility] OR;  Service: Gynecology;  Laterality: N/A;   DILATATION & CURETTAGE/HYSTEROSCOPY WITH MYOSURE N/A 10/17/2017   Procedure: DILATATION & CURETTAGE/HYSTEROSCOPY WITH MYOSURE;  Surgeon: Lacretia Piccolo, MD;  Location: [facility];  Service: Gynecology;  Laterality: N/A;   DILATATION & CURETTAGE/HYSTEROSCOPY WITH MYOSURE N/A 04/11/2018   Procedure: DILATATION & CURETTAGE/HYSTEROSCOPY WITH MYOSURE;  Surgeon: Lacretia Piccolo, MD;  Location: [facility];  Service: Gynecology;  Laterality: N/A;   GYNECOLOGIC CRYOSURGERY  age 74s   TONSILLECTOMY  age 71   TOTAL LAPAROSCOPIC HYSTERECTOMY WITH SALPINGECTOMY Bilateral 04/09/2019    Procedure: TOTAL LAPAROSCOPIC HYSTERECTOMY WITH SALPINGECTOMY;  Surgeon: Lillian Rein, MD;  Location: [facility] OR;  Service: Gynecology;  Laterality: Bilateral;  Has Mirena  IUD in place, 3 hours surgery time. Extended recovery bed.   TRANSTHORACIC ECHOCARDIOGRAM  01/17/2017   ef 60-65%/  mild TR   Past Medical History:  Diagnosis Date   Anemia    Anxiety    Arthritis    inflammatory   ASCUS with positive high risk HPV cervical 01/2019   Bladder spasm    COPD (chronic obstructive pulmonary disease) (HCC)    Fibromyalgia    GERD (gastroesophageal reflux disease)    Glaucoma    History of chronic bronchitis  last flare up 11-18-17   History of iron  deficiency anemia    History of pulmonary embolism 10/2016--- followed by pcp and pulmoloigst   dx bilateral PE , bilateral lower lobes , segmental , unprovoked, negative hypercoagulable work-up-- treated w/ eliquis    Migraine    Mild intermittent asthma    pulmologist-  dr Villa Greaser   Mixed connective tissue disease (HCC)    Morbid obesity with BMI of 45.0-49.9, adult (HCC)    Open-angle glaucoma of both eyes    Pre-diabetes    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   bilateral ---- stopped eliquis    Shingles    Sleep apnea    Vitamin D  deficiency    BP 101/69 (BP Location: Left Arm, Patient Position: Sitting, Cuff Size: Large)   Pulse 78   Ht 5\' 10"  (1.778 m)   Wt (!) 318 lb 9.6 oz (144.5 kg)   LMP 03/05/2019   SpO2 96%   BMI 45.71 kg/m   Opioid Risk Score:   Fall Risk Score:  `1  Depression screen [facility] 2/9     09/12/2023    9:07 AM 10/22/2022    9:01 AM 09/06/2022    9:36 AM 03/09/2022    9:54 AM 08/04/2021    9:11 AM 06/09/2020    8:14 AM 12/25/2019    8:50 AM  Depression screen PHQ 2/9  Decreased Interest 0 0 0 0 0 0 0  Down, Depressed, Hopeless 0 0 0 0 0 0 0  PHQ - 2 Score 0 0 0 0 0 0 0   Review of Systems  Constitutional: Negative.   HENT: Negative.    Eyes: Negative.   Respiratory:  Positive for apnea.   Cardiovascular:  Negative.   Gastrointestinal: Negative.   Endocrine: Negative.   Genitourinary:  Positive for difficulty urinating.       Bladder control  Musculoskeletal:  Positive for arthralgias, back pain, myalgias and neck pain.  Skin: Negative.   Allergic/Immunologic: Negative.   Neurological:  Positive for tremors.       Tingling  Hematological: Negative.   Psychiatric/Behavioral:  The patient is nervous/anxious.   All other systems reviewed and are negative.     Objective:   Physical Exam Gen: no distress, normal appearing, BMI 45.25, weight 301 lbs.  HEENT: oral mucosa pink and moist, NCAT Cardio: Reg rate Chest: normal effort, normal rate of breathing Abd: soft, non-distended Ext: no edema Psych: pleasant, normal affect Skin: intact Neuro: Alert an oriented x3. No tremor. TTP over right right lumbar spine, diffuse tender points on upper and lower sides of the body, dorsum of left foot hurts    Assessment & Plan:  Female with past medical history of tremors, pulmonary embolism, morbid obesity, migraines, fibromyalgia, prediabetes, asthma, fibroids, chronic bronchitis presents with pain all over.    1. Chronic diffuse pain             Multifactorial - Fibromyalgia, connective tissue disorder, inflammatory arthritis, fibroids s/p hysterectomy             Side effects with Gabapentin   -discussed that condition has been stable             Neck xray reviewed, showing mild disc space narrowing at C7-T1.             Continue Heat             Encouraged pool therapy (completed aquatic therapy), reminded again after CoVid.  Cont TENS IT with benefit, reminded             Continue Lidoderm  OTC  Discussed trigger point injections and she defers             continue Lyrica  75 BID - TID, usually taking twice daily  -continue topamax              Refilled Baclofen  10 qhs, unable to tolerate during the day             Refilled Robaxin  500 BID PRN             Cont to follow up with  Psychology    Prescribing Home Zynex NexWave Stimulator Device and supplies as needed. IFC, NMES and TENS medically necessary Treatment Rx: Daily @ 30-40 minutes per treatment PRN. Zynex NexWave only, no substitutions. Treatment Goals: 1) To reduce and/or eliminate pain 2) To improve functional capacity and Activities of daily living 3) To reduce or prevent the need for oral medications 4) To improve circulation in the injured region 5) To decrease or prevent muscle spasm and muscle atrophy 6) To provide a self-management tool to the patient The patient has not sufficiently improved with conservative care. Numerous studies indexed by Medline and PubMed.gov have shown Neuromuscular, Interferential, and TENS stimulators to reduce pain, improve function, and reduce medication use in injured patients. Continued use of this evidence based, safe, drug free treatment is both reasonable and medically necessary at this time.    -Provided with a pain relief journal and discussed that it contains foods and lifestyle tips to naturally help to improve pain. Discussed that these lifestyle strategies are also very good for health unlike some medications which can have negative side effects. Discussed that the act of keeping a journal can be therapeutic and helpful to realize patterns what helps to trigger and alleviate pain.    -Discussed Qutenza as an option for neuropathic pain control. Discussed that this is a capsaicin patch, stronger than capsaicin cream. Discussed that it is currently approved for diabetic peripheral neuropathy and post-herpetic neuralgia, but that it has also shown benefit in treating other forms of neuropathy. Provided patient with link to site to learn more about the patch: https://www.clark.biz/. Discussed that the patch would be placed in office and benefits usually last 3 months. Discussed that unintended exposure to capsaicin can cause severe irritation of eyes, mucous membranes, respiratory  tract, and skin, but that Qutenza is a local treatment and does not have the systemic side effects of other nerve medications. Discussed that there may be pain, itching, erythema, and decreased sensory function associated with the application of Qutenza. Side effects usually subside within 1 week. A cold pack of analgesic medications can help with these side effects. Blood pressure can also be increased due to pain associated with administration of the patch.   Continue 60mg  cymbalta   Infrared lamp script given.              Will consider accupuncture if necessary             Will consider NSAID if necessary  Prescribed Pamelor  10mg  daily prn for breakthrough pain -Discussed current symptoms of pain and history of pain.  -Discussed benefits of exercise in reducing pain. -Discussed following foods that may reduce pain: 1) Ginger (especially studied for arthritis)- reduce leukotriene production to decrease inflammation 2) Blueberries- high in phytonutrients that decrease inflammation 3) Salmon- marine omega-3s reduce joint swelling and pain 4) Pumpkin seeds- reduce inflammation  5) dark chocolate- reduces inflammation 6) turmeric- reduces inflammation 7) tart cherries - reduce pain and stiffness 8) extra virgin olive oil - its compound olecanthal helps to block prostaglandins  9) chili peppers- can be eaten or applied topically via capsaicin 10) mint- helpful for headache, muscle aches, joint pain, and itching 11) garlic- reduces inflammation 12) Green tea- reduces inflammation and oxidative stress, helps with weight loss, may reduce the risk of cancer, recommend Double Green Matcha [place] of Tea daily  Link to further information on diet for chronic pain: http://www.bray.com/                2. Gait abnormality             Continue slow transitional movements   3. Sleep disturbance with OSA             Improved with  meds             See #1   4. Morbid Obesity  Continue topamax  100mg  HS  Commended on 40 lbb weight loss!  Discussed that she has been watching her calories, walking her dog, discussed that she is prioritizing protein, discussed that she has been eating yogurt, cottage cheese, almonds  Discussed that her breakfast is typically yogurt and cottage cheese- discussed that she uses greek yogurt   5. Myalgia                     Will consider trigger point injections when able to tolerate             See #1  Not needed at present   6. Migraines           ContinueTopamax             See #1.   Foods to alleviate migraine:  1) dark leafy greens 2) avocado 3) tuna 4) samon and mackerel 5) beans and legumes Supplements that can be helpful: feverfew, B12, and magnesium  Foods to avoid in migraine: 1) Excessive (or irregular timing) coffee 2) red wine 3) aged cheeses 4) chocolate 5) citrus fruits 6) aspartame and other artifical sweeteners 7) yeast 8) MSG (in processed foods) 9) processed and cured meats 10) nuts and certain seeds 11) chicken livers and other organ meats 12) dairy products like buttermilk, sour cream, and yogurt 13) dried fruits like dates, figs, and raisins 14) garlic 15) onions 16) potato chips 17) pickled foods like olives and sauerkraut 18) some fresh fruits like ripe banana, papaya, red plums, raspberries, kiwi, pineapple 19) tomato-based products  Recommend to keep a migraine diary: rate daily the severity of your headache (1-10) and what foods you eat that day to help determine patterns.     7. Connective tissue disorder with rheumatoid arthritis             Continue to be worked up by Rheum             See #1             Plaquenil  DC'd, infusion started           continue infusion  Discussed Wahls protocol  8. Tremor: Discussed propanolol but we decided not severe enough now to consider.   9. Vitamin D  deficiency: -supplement removed by  rheumatology.  -discussed how her level went to 125 in [place].  -discussed how she felt that time.  -discussed that her PCP will check this.   10. Left ankle sprain: -continue ibuprofen   prn -discussed red light therapy.

## 2023-09-13 ENCOUNTER — Telehealth: Payer: Self-pay

## 2023-09-13 NOTE — Telephone Encounter (Signed)
 PA sent in for Topmax 100 MG submitted in Cover My Meds.

## 2023-09-14 ENCOUNTER — Telehealth: Payer: Self-pay

## 2023-09-14 NOTE — Telephone Encounter (Signed)
  PA for Topiramate  100 mg sent on 09/14/2023.   This medication or product is on your plan's list of covered drugs. Prior authorization is not required at this time. If your pharmacy has questions regarding the processing of your prescription, please have them call the [facility] pharmacy help desk at [phone]. **Please note: This request was submitted electronically. Formulary lowering, tiering exception, cost reduction and/or pre-benefit determination review (including prospective Medicare hospice reviews) requests cannot be requested using this method of submission. Providers contact us  at [phone] for further assistance.

## 2023-09-26 ENCOUNTER — Encounter: Payer: Self-pay | Admitting: Hematology

## 2023-10-18 ENCOUNTER — Ambulatory Visit: Admitting: Allergy

## 2023-10-18 NOTE — Progress Notes (Unsigned)
 Follow Up Note  RE: Holly Mcdonald MRN: [id number] DOB: 04-03-1976 Date of Office Visit: 10/19/2023  Referring provider: Pete Brand, DO Primary care provider: Pete Brand, DO  Chief Complaint: No chief complaint on file.  History of Present Illness: I had the pleasure of seeing Holly Mcdonald for a follow up visit at the [facility] on 10/19/2023. She is a 48 y.o. female, who is being followed for allergic rhinoconjunctivitis, asthma. Her previous allergy office visit was on 04/29/2022 with Dr. Tempie Fee. Today is a regular follow up visit.  Discussed the use of AI scribe software for clinical note transcription with the patient, who gave verbal consent to proceed.  History of Present Illness            ***  Assessment and Plan: Holly is a 48 y.o. female with: Allergic rhinitis with conjunctivitis with history of migraines     - continue avoidance measures for tree pollen, molds, dust mites and cockroach.     - continue use of Cetirizine  10mg  daily    - continue Singulair  10mg  daily at night    - continue using fluticasone  1-2 sprays each nostril daily as neededfor nasal congestion at this time    - for nasal drainage/throat clearing use nasal Ipratropium 2 sprays each nostril up to 3 times a day as needed.       - use nasal saline spray to help keep nose clean and moisturized    - consider allergen immunotherapy (allergy shots) if medication management becomes ineffective in controlling allergy symptoms.     - for nasal sores use Mupirocin  ointment apply to the nose with q-tip twice a day    Asthma, adult-onset    - under good control at this time    - continue Breo 200 mcg 1 puff daily     - have access to Ventolin  inhaler 2 puffs every 4-6 hours as needed for cough/wheeze/shortness of breath/chest tightness.  May use 15-20 minutes prior to activity.   Monitor frequency of use.     Asthma control goals:  Full participation in all  desired activities (may need albuterol  before activity) Albuterol  use two time or less a week on average (not counting use with activity) Cough interfering with sleep two time or less a month Oral steroids no more than once a year No hospitalizations   Assessment and Plan              No follow-ups on file.  No orders of the defined types were placed in this encounter.  Lab Orders  No laboratory test(s) ordered today    Diagnostics: Spirometry:  Tracings reviewed. Her effort: {Blank single:19197::"Good reproducible efforts.","It was hard to get consistent efforts and there is a question as to whether this reflects a maximal maneuver.","Poor effort, data can not be interpreted."} FVC: ***L FEV1: ***L, ***% predicted FEV1/FVC ratio: ***% Interpretation: {Blank single:19197::"Spirometry consistent with mild obstructive disease","Spirometry consistent with moderate obstructive disease","Spirometry consistent with severe obstructive disease","Spirometry consistent with possible restrictive disease","Spirometry consistent with mixed obstructive and restrictive disease","Spirometry uninterpretable due to technique","Spirometry consistent with normal pattern","No overt abnormalities noted given today's efforts"}.  Please see scanned spirometry results for details.  Skin Testing: {Blank single:19197::"Select foods","Environmental allergy panel","Environmental allergy panel and select foods","Food allergy panel","None","Deferred due to recent antihistamines use"}. *** Results discussed with patient/family.   Medication List:  Current Outpatient Medications  Medication Sig Dispense Refill  . albuterol  (VENTOLIN  HFA) 108 (90 Base) MCG/ACT inhaler INHALE TWO  PUFFS BY MOUTH EVERY 4 HOURS AS NEEDED FOR WHEEZING OR SHORTNESS OF BREATH 18 g 1  . baclofen  (LIORESAL ) 10 MG tablet TAKE 1 TABLET BY MOUTH 3 TIMES A DAY AS NEEDED FOR MUSCLE SPASMS 90 tablet 5  . benzonatate  (TESSALON ) 100 MG  capsule Take 1 capsule (100 mg total) by mouth every 8 (eight) hours. 21 capsule 0  . BREO ELLIPTA  200-25 MCG/ACT AEPB Inhale 1 puff into the lungs daily. 28 each 5  . brimonidine-timolol (COMBIGAN) 0.2-0.5 % ophthalmic solution Apply to eye.    . cetirizine  (ZYRTEC ) 10 MG tablet Take 1 tablet (10 mg total) by mouth every morning. 30 tablet 5  . Cyanocobalamin (VITAMIN B 12 PO) Take 1,000 mg by mouth daily.     . cyclobenzaprine (FLEXERIL) 10 MG tablet Take 10 mg by mouth 3 (three) times daily.    . DULoxetine  (CYMBALTA ) 60 MG capsule TAKE ONE CAPSULE BY MOUTH DAILY 30 capsule 2  . ELIQUIS  5 MG TABS tablet TAKE ONE TABLET BY MOUTH TWICE A DAY 60 tablet 2  . fluticasone  (FLONASE ) 50 MCG/ACT nasal spray 1-2 sprays per nostril daily as needed for nasal congestion. 16 g 5  . folic acid (FOLVITE) 1 MG tablet Take 2 mg by mouth daily.     Aaron Aas golimumab (SIMPONI ARIA) 50 MG/4ML SOLN injection Inject into the vein every 8 (eight) weeks.    Aaron Aas ipratropium (ATROVENT ) 0.03 % nasal spray Place 2 sprays into both nostrils 3 (three) times daily as needed (nasal drainage, throat clearing). 30 mL 5  . lidocaine  (XYLOCAINE ) 5 % ointment Apply 1 application topically as needed. 35.44 g 0  . methocarbamol  (ROBAXIN ) 500 MG tablet Take 1 tablet (500 mg total) by mouth 2 (two) times daily as needed for muscle spasms. 60 tablet 1  . methotrexate 2.5 MG tablet Take 15 mg by mouth once a week. Friday in the evening    . montelukast  (SINGULAIR ) 10 MG tablet Take 1 tablet (10 mg total) by mouth at bedtime. 30 tablet 5  . mupirocin  ointment (BACTROBAN ) 2 % PLACE 1 APPLICATION INTO THE NOSE TWO TIMES A DAY FOR 10 DAYS Strength: 2 % 22 g 1  . nortriptyline  (PAMELOR ) 10 MG capsule TAKE ONE CAPSULE BY MOUTH DAILY AS NEEDED FOR SLEEP 30 capsule 3  . omeprazole  (PRILOSEC) 40 MG capsule TAKE 1 CAPSULE(40 MG) BY MOUTH DAILY 90 capsule 2  . oxybutynin  (DITROPAN ) 5 MG tablet Take 5 mg by mouth 2 (two) times daily.   11  . predniSONE   (DELTASONE ) 20 MG tablet Take 20 mg by mouth 2 (two) times daily.    . predniSONE  (DELTASONE ) 50 MG tablet Take 1 tablet (50 mg total) by mouth every evening. 6 tablet 0  . pregabalin  (LYRICA ) 75 MG capsule Take 1 capsule (75 mg total) by mouth 2 (two) times daily. 60 capsule 3  . timolol (TIMOPTIC) 0.5 % ophthalmic solution Place 1 drop into both eyes daily.  (Patient not taking: Reported on 09/12/2023)    . topiramate  (TOPAMAX ) 100 MG tablet Take 1 tablet (100 mg total) by mouth at bedtime. 90 tablet 3  . Vitamin D , Ergocalciferol , (DRISDOL ) 1.25 MG (50000 UNIT) CAPS capsule TAKE 1 CAPSULE BY MOUTH EVERY 10 DAYS (Patient taking differently: Take 50,000 Units by mouth every Friday.) 3 capsule 4   No current facility-administered medications for this visit.   Allergies: Allergies  Allergen Reactions  . Other Nausea And Vomiting    Darvocet N-100 Other reaction(s): Vomiting  .  Darvon [Propoxyphene] Nausea And Vomiting   I reviewed her past medical history, social history, family history, and environmental history and no significant changes have been reported from her previous visit.  Review of Systems  Constitutional:  Negative for appetite change, chills, fever and unexpected weight change.  HENT:  Negative for congestion and rhinorrhea.   Eyes:  Negative for itching.  Respiratory:  Negative for cough, chest tightness, shortness of breath and wheezing.   Cardiovascular:  Negative for chest pain.  Gastrointestinal:  Negative for abdominal pain.  Genitourinary:  Negative for difficulty urinating.  Skin:  Negative for rash.  Neurological:  Negative for headaches.   Objective: LMP 03/05/2019  There is no height or weight on file to calculate BMI. Physical Exam Vitals and nursing note reviewed.  Constitutional:      Appearance: Normal appearance. She is well-developed.  HENT:     Head: Normocephalic and atraumatic.     Right Ear: Tympanic membrane and external ear normal.     Left  Ear: Tympanic membrane and external ear normal.     Nose: Nose normal.     Mouth/Throat:     Mouth: Mucous membranes are moist.     Pharynx: Oropharynx is clear.  Eyes:     Conjunctiva/sclera: Conjunctivae normal.  Cardiovascular:     Rate and Rhythm: Normal rate and regular rhythm.     Heart sounds: Normal heart sounds. No murmur heard.    No friction rub. No gallop.  Pulmonary:     Effort: Pulmonary effort is normal.     Breath sounds: Normal breath sounds. No wheezing, rhonchi or rales.  Musculoskeletal:     Cervical back: Neck supple.  Skin:    General: Skin is warm.     Findings: No rash.  Neurological:     Mental Status: She is alert and oriented to person, place, and time.  Psychiatric:        Behavior: Behavior normal.  Previous notes and tests were reviewed. The plan was reviewed with the patient/family, and all questions/concerned were addressed.  It was my pleasure to see Holly today and participate in her care. Please feel free to contact me with any questions or concerns.  Sincerely,  Eudelia Hero, DO Allergy & Immunology  [facility]  [facility] office: [phone] [facility] office: [phone]

## 2023-10-19 ENCOUNTER — Encounter: Payer: Self-pay | Admitting: Allergy

## 2023-10-19 ENCOUNTER — Other Ambulatory Visit: Payer: Self-pay

## 2023-10-19 ENCOUNTER — Ambulatory Visit (INDEPENDENT_AMBULATORY_CARE_PROVIDER_SITE_OTHER): Admitting: Allergy

## 2023-10-19 VITALS — BP 128/84 | HR 71 | Temp 97.4°F | Resp 18 | Ht 70.0 in | Wt 323.2 lb

## 2023-10-19 DIAGNOSIS — J3489 Other specified disorders of nose and nasal sinuses: Secondary | ICD-10-CM | POA: Diagnosis not present

## 2023-10-19 DIAGNOSIS — H1013 Acute atopic conjunctivitis, bilateral: Secondary | ICD-10-CM | POA: Diagnosis not present

## 2023-10-19 DIAGNOSIS — J4530 Mild persistent asthma, uncomplicated: Secondary | ICD-10-CM

## 2023-10-19 DIAGNOSIS — J3089 Other allergic rhinitis: Secondary | ICD-10-CM | POA: Diagnosis not present

## 2023-10-19 DIAGNOSIS — E7800 Pure hypercholesterolemia, unspecified: Secondary | ICD-10-CM | POA: Insufficient documentation

## 2023-10-19 DIAGNOSIS — J4520 Mild intermittent asthma, uncomplicated: Secondary | ICD-10-CM

## 2023-10-19 MED ORDER — MONTELUKAST SODIUM 10 MG PO TABS: 10.0000 mg | ORAL_TABLET | Freq: Every day | ORAL | 5 refills | Status: DC

## 2023-10-19 MED ORDER — MUPIROCIN 2 % EX OINT: TOPICAL_OINTMENT | CUTANEOUS | 0 refills | Status: AC

## 2023-10-19 MED ORDER — AIRSUPRA 90-80 MCG/ACT IN AERO: 2.0000 | INHALATION_SPRAY | RESPIRATORY_TRACT | 2 refills | Status: AC | PRN

## 2023-10-19 NOTE — Patient Instructions (Addendum)
 Asthma Today's spirometry was normal.  May use Airsupra rescue inhaler 2 puffs every 4 to 6 hours as needed for shortness of breath, chest tightness, coughing, and wheezing. Do not use more than 12 puffs in 24 hours. May use Airsupra rescue inhaler 2 puffs 5 to 15 minutes prior to strenuous physical activities. Rinse mouth after each use.  Coupon given.  Monitor frequency of use - if you need to use it more than twice per week on a consistent basis let us  know.  Breathing control goals:  Full participation in all desired activities (may need albuterol  before activity) Albuterol  use two times or less a week on average (not counting use with activity) Cough interfering with sleep two times or less a month Oral steroids no more than once a year No hospitalizations   Environmental allergies    Continue avoidance measures for tree pollen, molds, dust mites and cockroach.  Use over the counter antihistamines such as Zyrtec  (cetirizine ), Claritin  (loratadine ), Allegra (fexofenadine), or Xyzal (levocetirizine) daily as needed. May take twice a day during allergy flares. May switch antihistamines every few months. Restart Singulair  (montelukast ) 10mg  daily at night. Nasal saline spray (i.e., Simply Saline) or nasal saline lavage (i.e., NeilMed) is recommended as needed and prior to medicated nasal sprays. Check with your eye doctor about using your Flonase  (fluticasone ) nasal spray.  Nasal sores? Use bactroban  twice a day for 10 days.   Follow up in 6 months or sooner if needed.   Reducing Pollen Exposure Pollen seasons: trees (spring), grass (summer) and ragweed/weeds (fall). Keep windows closed in your home and car to lower pollen exposure.  Install air conditioning in the bedroom and throughout the house if possible.  Avoid going out in dry windy days - especially early morning. Pollen counts are highest between 5 - 10 AM and on dry, hot and windy days.  Save outside activities for late  afternoon or after a heavy rain, when pollen levels are lower.  Avoid mowing of grass if you have grass pollen allergy. Be aware that pollen can also be transported indoors on people and pets.  Dry your clothes in an automatic dryer rather than hanging them outside where they might collect pollen.  Rinse hair and eyes before bedtime.  Control of House Dust Mite Allergen Dust mite allergens are a common trigger of allergy and asthma symptoms. While they can be found throughout the house, these microscopic creatures thrive in warm, humid environments such as bedding, upholstered furniture and carpeting. Because so much time is spent in the bedroom, it is essential to reduce mite levels there.  Encase pillows, mattresses, and box springs in special allergen-proof fabric covers or airtight, zippered plastic covers.  Bedding should be washed weekly in hot water (130 F) and dried in a hot dryer. Allergen-proof covers are available for comforters and pillows that can't be regularly washed.  Wash the allergy-proof covers every few months. Minimize clutter in the bedroom. Keep pets out of the bedroom.  Keep humidity less than 50% by using a dehumidifier or air conditioning. You can buy a humidity measuring device called a hygrometer to monitor this.  If possible, replace carpets with hardwood, linoleum, or washable area rugs. If that's not possible, vacuum frequently with a vacuum that has a HEPA filter. Remove all upholstered furniture and non-washable window drapes from the bedroom. Remove all non-washable stuffed toys from the bedroom.  Wash stuffed toys weekly.  Mold Control Mold and fungi can grow on a variety of  surfaces provided certain temperature and moisture conditions exist.  Outdoor molds grow on plants, decaying vegetation and soil. The major outdoor mold, Alternaria and Cladosporium, are found in very high numbers during hot and dry conditions. Generally, a late summer - fall peak is seen  for common outdoor fungal spores. Rain will temporarily lower outdoor mold spore count, but counts rise rapidly when the rainy period ends. The most important indoor molds are Aspergillus and Penicillium. Dark, humid and poorly ventilated basements are ideal sites for mold growth. The next most common sites of mold growth are the bathroom and the kitchen. Outdoor (Seasonal) Mold Control Use air conditioning and keep windows closed. Avoid exposure to decaying vegetation. Avoid leaf raking. Avoid grain handling. Consider wearing a face mask if working in moldy areas.  Indoor (Perennial) Mold Control  Maintain humidity below 50%. Get rid of mold growth on hard surfaces with water, detergent and, if necessary, 5% bleach (do not mix with other cleaners). Then dry the area completely. If mold covers an area more than 10 square feet, consider hiring an indoor environmental professional. For clothing, washing with soap and water is best. If moldy items cannot be cleaned and dried, throw them away. Remove sources e.g. contaminated carpets. Repair and seal leaking roofs or pipes. Using dehumidifiers in damp basements may be helpful, but empty the water and clean units regularly to prevent mildew from forming. All rooms, especially basements, bathrooms and kitchens, require ventilation and cleaning to deter mold and mildew growth. Avoid carpeting on concrete or damp floors, and storing items in damp areas. Cockroach Allergen Avoidance Cockroaches are often found in the homes of densely populated urban areas, schools or commercial buildings, but these creatures can lurk almost anywhere. This does not mean that you have a dirty house or living area. Block all areas where roaches can enter the home. This includes crevices, wall cracks and windows.  Cockroaches need water to survive, so fix and seal all leaky faucets and pipes. Have an exterminator go through the house when your family and pets are gone to  eliminate any remaining roaches. Keep food in lidded containers and put pet food dishes away after your pets are done eating. Vacuum and sweep the floor after meals, and take out garbage and recyclables. Use lidded garbage containers in the kitchen. Wash dishes immediately after use and clean under stoves, refrigerators or toasters where crumbs can accumulate. Wipe off the stove and other kitchen surfaces and cupboards regularly.

## 2023-11-04 ENCOUNTER — Ambulatory Visit (HOSPITAL_BASED_OUTPATIENT_CLINIC_OR_DEPARTMENT_OTHER): Payer: 59 | Admitting: Obstetrics & Gynecology

## 2023-11-04 ENCOUNTER — Encounter (HOSPITAL_BASED_OUTPATIENT_CLINIC_OR_DEPARTMENT_OTHER): Payer: Self-pay | Admitting: Certified Nurse Midwife

## 2023-11-04 ENCOUNTER — Ambulatory Visit (HOSPITAL_BASED_OUTPATIENT_CLINIC_OR_DEPARTMENT_OTHER): Admitting: Certified Nurse Midwife

## 2023-11-04 ENCOUNTER — Other Ambulatory Visit (HOSPITAL_COMMUNITY)
Admission: RE | Admit: 2023-11-04 | Discharge: 2023-11-04 | Disposition: A | Discharge status: Home / Self Care | Source: Ambulatory Visit | Attending: Certified Nurse Midwife | Admitting: Certified Nurse Midwife

## 2023-11-04 VITALS — BP 120/77 | HR 78 | Ht 70.0 in | Wt 323.8 lb

## 2023-11-04 DIAGNOSIS — Z01419 Encounter for gynecological examination (general) (routine) without abnormal findings: Secondary | ICD-10-CM

## 2023-11-04 DIAGNOSIS — Z113 Encounter for screening for infections with a predominantly sexual mode of transmission: Secondary | ICD-10-CM | POA: Insufficient documentation

## 2023-11-04 NOTE — Progress Notes (Signed)
 48 y.o. G0P0000 Divorced Black or [place] American female here for annual exam.    Patient's last menstrual period was 03/05/2019.          Sexually active: Yes.    The current method of family planning is status post hysterectomy.    Exercising: Yes.    walking Smoker:  no  Health Maintenance: Pap:  10/02/2021 Negative MMG:  03/29/2020 Negative Colonoscopy:  02/18/2022   reports that she has never smoked. She has never used smokeless tobacco. She reports current alcohol use. She reports that she does not use drugs.  Past Medical History:  Diagnosis Date   Anemia    Anxiety    Arthritis    inflammatory   ASCUS with positive high risk HPV cervical 01/2019   Bladder spasm    COPD (chronic obstructive pulmonary disease) (HCC)    Fibromyalgia    GERD (gastroesophageal reflux disease)    Glaucoma    History of chronic bronchitis    last flare up 11-18-17   History of iron  deficiency anemia    History of pulmonary embolism 10/2016--- followed by pcp and pulmoloigst   dx bilateral PE , bilateral lower lobes , segmental , unprovoked, negative hypercoagulable work-up-- treated w/ eliquis    Migraine    Mild intermittent asthma    pulmologist-  dr jude   Mixed connective tissue disease (HCC)    Morbid obesity with BMI of 45.0-49.9, adult (HCC)    Open-angle glaucoma of both eyes    Pre-diabetes    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   bilateral ---- stopped eliquis    Shingles    Sleep apnea    Vitamin D  deficiency     Past Surgical History:  Procedure Laterality Date   ANAL EXAMINATION UNDER ANESTHESIA  age 10   w/ removal cyst   COLONOSCOPY WITH PROPOFOL  N/A 02/18/2022   Procedure: COLONOSCOPY WITH PROPOFOL ;  Surgeon: Legrand Victory LITTIE DOUGLAS, MD;  Location: [facility]COPY;  Service: Gastroenterology;  Laterality: N/A;   CYSTOSCOPY N/A 04/09/2019   Procedure: CYSTOSCOPY;  Surgeon: Cleotilde Ronal RAMAN, MD;  Location: [facility] OR;  Service: Gynecology;  Laterality: N/A;   DILATATION &  CURETTAGE/HYSTEROSCOPY WITH MYOSURE N/A 10/17/2017   Procedure: DILATATION & CURETTAGE/HYSTEROSCOPY WITH MYOSURE;  Surgeon: Rockney Evalene SQUIBB, MD;  Location: [facility];  Service: Gynecology;  Laterality: N/A;   DILATATION & CURETTAGE/HYSTEROSCOPY WITH MYOSURE N/A 04/11/2018   Procedure: DILATATION & CURETTAGE/HYSTEROSCOPY WITH MYOSURE;  Surgeon: Rockney Evalene SQUIBB, MD;  Location: [facility];  Service: Gynecology;  Laterality: N/A;   GYNECOLOGIC CRYOSURGERY  age 27s   TONSILLECTOMY  age 45   TOTAL LAPAROSCOPIC HYSTERECTOMY WITH SALPINGECTOMY Bilateral 04/09/2019   Procedure: TOTAL LAPAROSCOPIC HYSTERECTOMY WITH SALPINGECTOMY;  Surgeon: Cleotilde Ronal RAMAN, MD;  Location: [facility] OR;  Service: Gynecology;  Laterality: Bilateral;  Has Mirena  IUD in place, 3 hours surgery time. Extended recovery bed.   TRANSTHORACIC ECHOCARDIOGRAM  01/17/2017   ef 60-65%/  mild TR    Current Outpatient Medications  Medication Sig Dispense Refill   Albuterol -Budesonide (AIRSUPRA ) 90-80 MCG/ACT AERO Inhale 2 puffs into the lungs every 4 (four) hours as needed (coughing, wheezing, chest tightness). Do not exceed 12 puffs in 24 hours. 10.7 g 2   baclofen  (LIORESAL ) 10 MG tablet TAKE 1 TABLET BY MOUTH 3 TIMES A DAY AS NEEDED FOR MUSCLE SPASMS 90 tablet 5   benzonatate  (TESSALON ) 100 MG capsule Take 1 capsule (100 mg total) by mouth every 8 (eight) hours. 21 capsule 0  brimonidine-timolol (COMBIGAN) 0.2-0.5 % ophthalmic solution Apply to eye.     cetirizine  (ZYRTEC ) 10 MG tablet Take 1 tablet (10 mg total) by mouth every morning. 30 tablet 5   Cyanocobalamin (VITAMIN B 12 PO) Take 1,000 mg by mouth daily.      cyclobenzaprine (FLEXERIL) 10 MG tablet Take 10 mg by mouth 3 (three) times daily.     DULoxetine  (CYMBALTA ) 60 MG capsule TAKE ONE CAPSULE BY MOUTH DAILY 30 capsule 2   ELIQUIS  5 MG TABS tablet TAKE ONE TABLET BY MOUTH TWICE A DAY 60 tablet 2   folic acid (FOLVITE) 1 MG tablet Take 2 mg by  mouth daily.  (Patient not taking: Reported on 10/19/2023)     golimumab (SIMPONI ARIA) 50 MG/4ML SOLN injection Inject into the vein every 8 (eight) weeks.     ipratropium (ATROVENT ) 0.03 % nasal spray Place 2 sprays into both nostrils 3 (three) times daily as needed (nasal drainage, throat clearing). 30 mL 5   lidocaine  (XYLOCAINE ) 5 % ointment Apply 1 application topically as needed. 35.44 g 0   methocarbamol  (ROBAXIN ) 500 MG tablet Take 1 tablet (500 mg total) by mouth 2 (two) times daily as needed for muscle spasms. 60 tablet 1   methotrexate 2.5 MG tablet Take 15 mg by mouth once a week. Friday in the evening     montelukast  (SINGULAIR ) 10 MG tablet Take 1 tablet (10 mg total) by mouth at bedtime. 30 tablet 5   mupirocin  ointment (BACTROBAN ) 2 % PLACE 1 APPLICATION INTO THE NOSE TWO TIMES A DAY FOR 10 DAYS Strength: 2 % 22 g 0   nortriptyline  (PAMELOR ) 10 MG capsule TAKE ONE CAPSULE BY MOUTH DAILY AS NEEDED FOR SLEEP 30 capsule 3   omeprazole  (PRILOSEC) 40 MG capsule TAKE 1 CAPSULE(40 MG) BY MOUTH DAILY 90 capsule 2   oxybutynin  (DITROPAN ) 5 MG tablet Take 5 mg by mouth 2 (two) times daily.   11   predniSONE  (DELTASONE ) 20 MG tablet Take 20 mg by mouth 2 (two) times daily.     predniSONE  (DELTASONE ) 50 MG tablet Take 1 tablet (50 mg total) by mouth every evening. 6 tablet 0   pregabalin  (LYRICA ) 75 MG capsule Take 1 capsule (75 mg total) by mouth 2 (two) times daily. 60 capsule 3   topiramate  (TOPAMAX ) 100 MG tablet Take 1 tablet (100 mg total) by mouth at bedtime. 90 tablet 3   Vitamin D , Ergocalciferol , (DRISDOL ) 1.25 MG (50000 UNIT) CAPS capsule TAKE 1 CAPSULE BY MOUTH EVERY 10 DAYS (Patient taking differently: Take 50,000 Units by mouth every Friday.) 3 capsule 4   No current facility-administered medications for this visit.    Family History  Problem Relation Age of Onset   Pulmonary embolism Mother        Last year   Fibromyalgia Mother    Heart disease Mother        CHF    Congestive Heart Failure Mother    Colon polyps Mother    Heart disease Father 4       CAD   Hyperlipidemia Father    Heart attack Father    Autism Sister    Sleep apnea Brother    Diabetes Paternal Grandmother    Heart attack Paternal Grandmother    Congestive Heart Failure Maternal Aunt    Deep vein thrombosis Maternal Aunt    Mental retardation Maternal Aunt    Cancer Neg Hx     ROS: Constitutional: negative Genitourinary:negative  Exam:  BP 120/77   Pulse 78   Ht 5' 10 (1.778 m) Comment: Reported  Wt (!) 323 lb 12.8 oz (146.9 kg)   LMP 03/05/2019   BMI 46.46 kg/m   Height: 5' 10 (177.8 cm) (Reported)  General appearance: alert, cooperative and appears stated age Head: Normocephalic, without obvious abnormality, atraumatic Breasts: normal appearance, no masses or tenderness, Inspection negative, No nipple retraction or dimpling, No nipple discharge or bleeding, No axillary or supraclavicular adenopathy, Normal to palpation without dominant masses Heart: regular rate and rhythm Abdomen: soft, non-tender; bowel sounds normal; no masses,  no organomegaly Extremities: extremities normal, atraumatic, no cyanosis or edema Skin: Skin color, texture, turgor normal. No rashes or lesions Lymph nodes: Cervical, supraclavicular, and axillary nodes normal. No abnormal inguinal nodes palpated Neurologic: Grossly normal   Pelvic: External genitalia:  no lesions              Urethra:  normal appearing urethra with no masses, tenderness or lesions              Bartholins and Skenes: normal                 Vagina: normal appearing vagina with normal color and no discharge, no lesions              Cervix: absent              Pap taken: No. Bimanual Exam:  Uterus:  uterus absent              Adnexa: no mass, fullness, tenderness               Rectovaginal: Confirms               Anus:  normal sphincter tone, no lesions  Chaperone, CMA, was present for  exam.  Assessment/Plan:  1. Encounter for annual routine gynecological examination - Breast self awareness encouraged - Continue annual screening mammograms  2. Screening examination for STI (Primary) -pt desired routine STI screening  - RPR - Cervicovaginal ancillary only( [place]) - Hepatitis B Surface AntiGEN - Hepatitis C Antibody - HIV antibody (with reflex)   RTO 1year for annual gyn exam and prn if issues arise. Arland MARLA Roller

## 2023-11-05 LAB — HEPATITIS C ANTIBODY: Hep C Virus Ab: NONREACTIVE

## 2023-11-05 LAB — HIV ANTIBODY (ROUTINE TESTING W REFLEX): HIV Screen 4th Generation wRfx: NONREACTIVE

## 2023-11-05 LAB — HEPATITIS B SURFACE ANTIGEN: Hepatitis B Surface Ag: NEGATIVE

## 2023-11-07 ENCOUNTER — Ambulatory Visit (HOSPITAL_BASED_OUTPATIENT_CLINIC_OR_DEPARTMENT_OTHER): Payer: Self-pay | Admitting: Certified Nurse Midwife

## 2023-11-08 LAB — CERVICOVAGINAL ANCILLARY ONLY
Chlamydia: NEGATIVE
Comment: NEGATIVE
Comment: NEGATIVE
Comment: NORMAL
Neisseria Gonorrhea: NEGATIVE
Trichomonas: NEGATIVE

## 2023-11-16 ENCOUNTER — Other Ambulatory Visit: Payer: Self-pay | Admitting: Allergy

## 2024-02-27 ENCOUNTER — Ambulatory Visit: Admitting: Adult Health

## 2024-02-27 ENCOUNTER — Encounter: Payer: Self-pay | Admitting: Adult Health

## 2024-02-27 VITALS — BP 117/68 | HR 92 | Ht 70.0 in | Wt 334.0 lb

## 2024-02-27 DIAGNOSIS — J4520 Mild intermittent asthma, uncomplicated: Secondary | ICD-10-CM

## 2024-02-27 DIAGNOSIS — Z86711 Personal history of pulmonary embolism: Secondary | ICD-10-CM

## 2024-02-27 DIAGNOSIS — J309 Allergic rhinitis, unspecified: Secondary | ICD-10-CM

## 2024-02-27 DIAGNOSIS — G4733 Obstructive sleep apnea (adult) (pediatric): Secondary | ICD-10-CM

## 2024-02-27 DIAGNOSIS — Z6841 Body Mass Index (BMI) 40.0 and over, adult: Secondary | ICD-10-CM

## 2024-02-27 NOTE — Progress Notes (Signed)
 @Patient  ID: Holly Mcdonald, female    DOB: Sep 20, 1975, 48 y.o.   MRN: [id number]  Chief Complaint  Patient presents with   Medical Management of Chronic Issues   Fatigue   Snoring         Referring provider: Gerome Brunet, DO  HPI: 48 yo female never smoker seen for consult to re-establish for OSA and unprovoked bilateral PE (Dx 2018 treated with Eliquis , discontinued briefly due to retinal hemorrhages, Eliquis  restarted 2019 for lifelong anticoagulation) . Last seen 2021    She is followed by asthma and allergy  Dr. Wylie is on Advair , Singulair  , Zyrtec  and Flonase  Medical history significant for fibromyalgia, iron  deficiency anemia, fibroids status post hysterectomy November 2020, Rheumatoid Arthritis/early connective tissue disease (followed by Rheumatology)  She works in insurance   TEST/EVENTS :  Home sleep study February 14, 2019 showed mild to moderate obstructive sleep apnea with AHI 9.3/hour (events were worse in supine position-16/hours    Discussed the use of AI scribe software for clinical note transcription with the patient, who gave verbal consent to proceed.  History of Present Illness Holly Mcdonald is a 49 year old female with sleep apnea who presents to reestablish care.  She has not been compliant with her CPAP therapy, stating 'me and the mask, we was fighting.' She has tried different masks but has not been able to use them consistently due to caring for her mother post-surgery, which required her to sleep in a recliner. She plans to resume CPAP use once she can return to her bed. She experiences snoring and some daytime sleepiness related to sleep apnea.Epworth score is 14 out of 24, sleep when resting/watching TV , evening hours. Goes to bed around 10 pm and get up 730 am. Weight is up 30lbs. Current weight is 334lb, BMI 47. We discussed healthy weight loss.   She has a history of unprovoked bilateral pulmonary emboli diagnosed in  2018 and is on lifelong anticoagulation therapy with Eliquis . She confirms continued use of Eliquis  and is managed by Dr. Gerome. No current bleeding issues on Eliquis . Education provided.   Her past medical history includes asthma and allergies, for which she is followed by an asthma and allergy center. She uses Marketing executive as needed, Singulair  daily, Zyrtec  daily, and Flonase  as needed. Her asthma is well-controlled and only flares when she is sick.  She also has a history of fibromyalgia, anemia, and Rheumatoid arthritis/CTD. She is on Simponi for rheumatoid arthritis and mixed connective tissue disease, and she reports significant improvement in mobility with this treatment. She previously used Plaquenil  and prednisone  but has since stopped due to insufficient relief and potential side effects.  She was recently diagnosed with alopecia and is planning a biopsy to determine the type. She has noticed significant hair thinning and has opted to cut her hair short. She is not currently pursuing medication for alopecia due to her extensive medication regimen.  Her social history includes working full-time at [employer] for 26 years. She is single and has no children. She occasionally consumes alcohol and has never smoked or used recreational drugs.  In terms of family history, her mother has similar autoimmune issues,  .     Allergies  Allergen Reactions   Other Nausea And Vomiting, Nausea Only and Other (See Comments)    Darvocet N-100  Other reaction(s): Vomiting   Propoxyphene Nausea And Vomiting and Nausea Only    Immunization History  Administered Date(s) Administered  Influenza Inj Mdck Quad Pf 07/05/2016   Influenza,inj,Quad PF,6+ Mos 01/25/2017, 02/15/2018   Influenza-Unspecified 07/05/2016, 02/02/2019   PFIZER(Purple Top)SARS-COV-2 Vaccination 07/09/2019, 07/25/2019, 08/09/2019, 08/18/2019, 02/08/2020, 07/08/2020   Pneumococcal Polysaccharide-23 11/04/2016   Tdap  05/10/2012    Past Medical History:  Diagnosis Date   Anemia    Anxiety    Arthritis    inflammatory   ASCUS with positive high risk HPV cervical 01/2019   Bladder spasm    COPD (chronic obstructive pulmonary disease) (HCC)    Fibromyalgia    GERD (gastroesophageal reflux disease)    Glaucoma    History of chronic bronchitis    last flare up 11-18-17   History of iron  deficiency anemia    History of pulmonary embolism 10/2016--- followed by pcp and pulmoloigst   dx bilateral PE , bilateral lower lobes , segmental , unprovoked, negative hypercoagulable work-up-- treated w/ eliquis    Migraine    Mild intermittent asthma    pulmologist-  dr jude   Mixed connective tissue disease    Morbid obesity with BMI of 45.0-49.9, adult (HCC)    Open-angle glaucoma of both eyes    Pre-diabetes    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   bilateral ---- stopped eliquis    Shingles    Sleep apnea    Vitamin D  deficiency     Tobacco History: Social History   Tobacco Use  Smoking Status Never  Smokeless Tobacco Never   Counseling given: Not Answered   Outpatient Medications Prior to Visit  Medication Sig Dispense Refill   Albuterol -Budesonide (AIRSUPRA ) 90-80 MCG/ACT AERO Inhale 2 puffs into the lungs every 4 (four) hours as needed (coughing, wheezing, chest tightness). Do not exceed 12 puffs in 24 hours. 10.7 g 2   baclofen  (LIORESAL ) 10 MG tablet TAKE 1 TABLET BY MOUTH 3 TIMES A DAY AS NEEDED FOR MUSCLE SPASMS 90 tablet 5   benzonatate  (TESSALON ) 100 MG capsule Take 1 capsule (100 mg total) by mouth every 8 (eight) hours. 21 capsule 0   brimonidine-timolol (COMBIGAN) 0.2-0.5 % ophthalmic solution Apply to eye.     cetirizine  (ZYRTEC ) 10 MG tablet Take 1 tablet (10 mg total) by mouth every morning. 30 tablet 5   Cyanocobalamin (VITAMIN B 12 PO) Take 1,000 mg by mouth daily.      cyclobenzaprine (FLEXERIL) 10 MG tablet Take 10 mg by mouth 3 (three) times daily.     DULoxetine  (CYMBALTA ) 60 MG  capsule TAKE ONE CAPSULE BY MOUTH DAILY 30 capsule 2   ELIQUIS  5 MG TABS tablet TAKE ONE TABLET BY MOUTH TWICE A DAY 60 tablet 2   folic acid (FOLVITE) 1 MG tablet Take 2 mg by mouth daily.      golimumab (SIMPONI ARIA) 50 MG/4ML SOLN injection Inject into the vein every 8 (eight) weeks.     ipratropium (ATROVENT ) 0.03 % nasal spray Place 2 sprays into both nostrils 3 (three) times daily as needed (nasal drainage, throat clearing). 30 mL 5   lidocaine  (XYLOCAINE ) 5 % ointment Apply 1 application topically as needed. 35.44 g 0   methocarbamol  (ROBAXIN ) 500 MG tablet Take 1 tablet (500 mg total) by mouth 2 (two) times daily as needed for muscle spasms. 60 tablet 1   methotrexate 2.5 MG tablet Take 15 mg by mouth once a week. Friday in the evening     montelukast  (SINGULAIR ) 10 MG tablet Take 1 tablet (10 mg total) by mouth at bedtime. 30 tablet 5   mupirocin  ointment (BACTROBAN ) 2 % PLACE 1  APPLICATION INTO THE NOSE TWO TIMES A DAY FOR 10 DAYS Strength: 2 % 22 g 0   nortriptyline  (PAMELOR ) 10 MG capsule TAKE ONE CAPSULE BY MOUTH DAILY AS NEEDED FOR SLEEP 30 capsule 3   omeprazole  (PRILOSEC) 40 MG capsule TAKE 1 CAPSULE(40 MG) BY MOUTH DAILY 90 capsule 2   oxybutynin  (DITROPAN ) 5 MG tablet Take 5 mg by mouth 2 (two) times daily.   11   predniSONE  (DELTASONE ) 20 MG tablet Take 20 mg by mouth 2 (two) times daily.     predniSONE  (DELTASONE ) 50 MG tablet Take 1 tablet (50 mg total) by mouth every evening. 6 tablet 0   pregabalin  (LYRICA ) 75 MG capsule Take 1 capsule (75 mg total) by mouth 2 (two) times daily. 60 capsule 3   topiramate  (TOPAMAX ) 100 MG tablet Take 1 tablet (100 mg total) by mouth at bedtime. 90 tablet 3   Vitamin D , Ergocalciferol , (DRISDOL ) 1.25 MG (50000 UNIT) CAPS capsule TAKE 1 CAPSULE BY MOUTH EVERY 10 DAYS (Patient taking differently: Take 50,000 Units by mouth every Friday.) 3 capsule 4   No facility-administered medications prior to visit.     Review of Systems:    Constitutional:   No  weight loss, night sweats,  Fevers, chills,+ fatigue, or  lassitude.  HEENT:   No headaches,  Difficulty swallowing,  Tooth/dental problems, or  Sore throat,                No sneezing, itching, ear ache, nasal congestion, post nasal drip,   CV:  No chest pain,  Orthopnea, PND, swelling in lower extremities, anasarca, dizziness, palpitations, syncope.   GI  No heartburn, indigestion, abdominal pain, nausea, vomiting, diarrhea, change in bowel habits, loss of appetite, bloody stools.   Resp: No shortness of breath with exertion or at rest.  No excess mucus, no productive cough,  No non-productive cough,  No coughing up of blood.  No change in color of mucus.  No wheezing.  No chest wall deformity  Skin: no rash or lesions.  GU: no dysuria, change in color of urine, no urgency or frequency.  No flank pain, no hematuria   MS:  Joint pain    Physical Exam  BP 117/68   Pulse 92   Ht 5' 10 (1.778 m)   Wt (!) 334 lb (151.5 kg)   LMP 03/05/2019   SpO2 98%   BMI 47.92 kg/m   GEN: A/Ox3; pleasant , NAD, well nourished    HEENT:  [place]/AT,  , NOSE-clear, THROAT-clear, no lesions, no postnasal drip or exudate noted. Class 3 MP airway   NECK:  Supple w/ fair ROM; no JVD; normal carotid impulses w/o bruits; no thyromegaly or nodules palpated; no lymphadenopathy.    RESP  Clear  P & A; w/o, wheezes/ rales/ or rhonchi. no accessory muscle use, no dullness to percussion  CARD:  RRR, no m/r/g, tr  peripheral edema, pulses intact, no cyanosis or clubbing.  GI:   Soft & nt; nml bowel sounds; no organomegaly or masses detected.   Musco: Warm bil, no deformities or joint swelling noted.   Neuro: alert, no focal deficits noted.    Skin: Warm, no lesions or rashes    Lab Results:  CBC  BNP No results found for: BNP  ProBNP No results found for: PROBNP  Imaging: No results found.  Administration History     None          Latest Ref Rng & Units  01/26/2019  8:57 AM  PFT Results  FVC-Pre L 3.59  P  FVC-Predicted Pre % 95  P  FVC-Post L 3.38  P  FVC-Predicted Post % 90  P  Pre FEV1/FVC % % 85  P  Post FEV1/FCV % % 90  P  FEV1-Pre L 3.04  P  FEV1-Predicted Pre % 99  P  FEV1-Post L 3.04  P  DLCO uncorrected ml/min/mmHg 28.07  P  DLCO UNC% % 106  P  DLVA Predicted % 138  P  TLC L 5.01  P  TLC % Predicted % 84  P  RV % Predicted % 69  P    P Preliminary result    Lab Results  Component Value Date   NITRICOXIDE 25 05/08/2018        Assessment & Plan:   Assessment and Plan Assessment & Plan Obstructive sleep apnea  -uncontrolled  Her obstructive sleep apnea, previously well-controlled with CPAP, is currently unmanaged due to personal circumstances and mask fit issues. She plans to restart CPAP therapy with current supplies once she can return to her bed and is motivated to resume use. Insurance does not cover weight loss medications like Zepbound, even if indicated for her sleep apnea. A repeat sleep study may be considered if insurance coverage changes as Zepbound is approved for OSA.  CPAP care discussed  Follow up in 3 months to assess CPAP adherence and effectiveness.  Obesity  -BMI 47 -progressive weight gain  She is working on lifestyle changes, including diet and physical activity. Encourage weight loss through diet and increased physical activity. Monitor weight changes.  Unprovoked bilateral pulmonary embolism  -stable on Eliquis   Diagnosed with unprovoked bilateral pulmonary embolism in 2018, she is on lifelong anticoagulation with Eliquis  without issues related to bleeding or anticoagulation management. Continue Eliquis  as prescribed and coordinate anticoagulation management with Dr. Gerome.  Asthma/Allergic rhinitis -controlled on current regimen    Plan  Patient Instructions  Restart CPAP At bedtime  .  Try to wear for at least 4hr or more each and every night .  Work on healthy weight loss.  Do not  drive if sleepy .   Continue on Airsupra  As needed   ,rinse after use.  Continue on Singulair  10mg  At bedtime   Continue on Zyrtec  10mg  At bedtime   Continue on Flonase  daily As needed    Continue on Eliquis  Twice daily    Follow up with Dr. Jude or Auther Lyerly NP  in 3 months and As needed             Madelin Stank, NP 02/27/2024

## 2024-02-27 NOTE — Patient Instructions (Addendum)
 Restart CPAP At bedtime  .  Try to wear for at least 4hr or more each and every night .  Work on healthy weight loss.  Do not drive if sleepy .   Continue on Airsupra  As needed   ,rinse after use.  Continue on Singulair  10mg  At bedtime   Continue on Zyrtec  10mg  At bedtime   Continue on Flonase  daily As needed    Continue on Eliquis  Twice daily    Follow up with Dr. Jude or Ramy Greth NP  in 3 months and As needed

## 2024-03-08 ENCOUNTER — Ambulatory Visit: Admitting: Adult Health

## 2024-03-08 ENCOUNTER — Other Ambulatory Visit: Payer: Self-pay | Admitting: Physical Medicine and Rehabilitation

## 2024-03-12 ENCOUNTER — Encounter: Attending: Physical Medicine and Rehabilitation | Admitting: Physical Medicine and Rehabilitation

## 2024-03-12 VITALS — BP 120/58 | HR 84 | Ht 70.0 in | Wt 344.0 lb

## 2024-03-12 DIAGNOSIS — G894 Chronic pain syndrome: Secondary | ICD-10-CM | POA: Insufficient documentation

## 2024-03-12 DIAGNOSIS — S93402S Sprain of unspecified ligament of left ankle, sequela: Secondary | ICD-10-CM | POA: Insufficient documentation

## 2024-03-12 NOTE — Progress Notes (Signed)
 Subjective:    Patient ID: Holly Mcdonald, female    DOB: 02/16/1976, 48 y.o.   MRN: [id number]  HPI   1) Chronic pain: -pain has been stable, she tries not to let it limit her Holly Mcdonald is a 48 year old Mcdonald who presents for follow-up of tremors, pulmonary embolism, morbid obesity, migraines, fibromyalgia, prediabetes, asthma, fibroids, chronic bronchitis presents with fibromyalgia.  Initially stated: Started 10/2016 after diagnosed PE.  Progressively getting worse.  Heat improved the pain.  Fibroids have exacerbated the pain, with surgery planned for 10/17/17.  Activity exacerbates the pain.  All qualities of pain.  Non-radiating.  Constant.  Associated muscle spasms and tingling.  Tylenol  does not help.  Cymbalta  provides some benefit. Denies falls. Pain limits all activities.   -pain has been stable  -the month of October was not a good month as she had multiple deaths in the family  Works as clinical biochemist.    Went to [facility] and had a good trip  Stayed with her grandma and caught COVID form her  Her pain has been stable  Her whole family is feeling fine  Getting ready to go to [place] in December  Next year she is going to take it easy and not d any International Trips  Pain has been stable  She recently went on a trip to [place]. She needed wheelchair assistance on the way home but had a great trip.  She got her infusion for her RA this morning and feeling better. Getting these 8 weeks.   She has been doing so much better since she has seen rheumatology. She has some resistance getting there as her initial labs were not positive for RA. 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   She follows with Dr. Derrek who originally treated her with plaquenil  and methotrexate.   For a long time she had resisted going to pain management.   Needs refill of baclofen , robaxin   She forgets that she has a TENS.  She has one in case she needs. She is laying down  under her electric blanket. She is using Lidocaine  patch, Baclofen  rarely.  She is taking Lyrica  BID, TID during flares.  Denies falls. She states she is gaining weight.  She follows up with headache specialist.  She is seeing Rheum and receives infusion.   Muscle relaxers only used ocassionally.   He had shingles recently and pain has now improved  She was on Amitriptyline as a child.   She has Holly sided hand tremors. She was told to take B12.   Holly Mcdonald: -she does not feel her ankle will ever be the same- it still pops and locks, she did PT when it happened -using the boot and planning to transition to her brace -she is starting therapy soon -she has sprained that ankle 24 years ago as well -last time she needed a cast -taking ibuprofen   RA -when her weight is down the cost of her RA medication decreases  Obesity: -she gained 10 lbs over the winter -she is still overall 40 lbs down -she has been trying to watch what she eats -she walks her dogs  Pain Inventory Average Pain 3 Pain Right Now 4 My pain is dull, tingling, and aching  In the last 24 hours, has pain interfered with the following? General activity 2 Relation with others 0 Enjoyment of life 0 What TIME of day is your pain at its worst? morning, daytime, evening, night  Sleep (in general) Good  Pain is worse with: walking, bending, sitting, inactivity, standing, and some activites Pain improves with: rest, heat/ice, therapy/exercise, medication, and TENS Relief from Meds: 9    Family History  Problem Relation Age of Onset   Pulmonary embolism Mother        Last year   Fibromyalgia Mother    Heart disease Mother        CHF   Congestive Heart Failure Mother    Colon polyps Mother    Heart disease Father 73       CAD   Hyperlipidemia Father    Heart attack Father    Autism Sister    Sleep apnea Brother    Diabetes Paternal Grandmother    Heart attack Paternal Grandmother    Congestive  Heart Failure Maternal Aunt    Deep vein thrombosis Maternal Aunt    Mental retardation Maternal Aunt    Cancer Neg Hx    Social History   Socioeconomic History   Marital status: Divorced    Spouse name: Not on file   Number of children: 0   Years of education: Not on file   Highest education level: Not on file  Occupational History   Occupation: representative    Comment: [employer]  Tobacco Use   Smoking status: Never   Smokeless tobacco: Never  Vaping Use   Vaping status: Never Used  Substance and Sexual Activity   Alcohol use: Yes    Comment: occasionally   Drug use: No   Sexual activity: Not Currently    Birth control/protection: None    Comment: 1st intercourse 48 yo-More than 5 partners Mirena  09/16/2017  Other Topics Concern   Not on file  Social History Narrative   Not on file   Social Drivers of Health   Financial Resource Strain: Not on file  Food Insecurity: Not on file  Transportation Needs: Not on file  Physical Activity: Not on file  Stress: Not on file  Social Connections: Not on file   Past Surgical History:  Procedure Laterality Date   ANAL EXAMINATION UNDER ANESTHESIA  age 43   w/ removal cyst   COLONOSCOPY WITH PROPOFOL  N/A 02/18/2022   Procedure: COLONOSCOPY WITH PROPOFOL ;  Surgeon: Legrand Victory LITTIE DOUGLAS, MD;  Location: [facility];  Service: Gastroenterology;  Laterality: N/A;   CYSTOSCOPY N/A 04/09/2019   Procedure: CYSTOSCOPY;  Surgeon: Cleotilde Ronal RAMAN, MD;  Location: [facility] OR;  Service: Gynecology;  Laterality: N/A;   DILATATION & CURETTAGE/HYSTEROSCOPY WITH MYOSURE N/A 10/17/2017   Procedure: DILATATION & CURETTAGE/HYSTEROSCOPY WITH MYOSURE;  Surgeon: Rockney Evalene SQUIBB, MD;  Location: [facility];  Service: Gynecology;  Laterality: N/A;   DILATATION & CURETTAGE/HYSTEROSCOPY WITH MYOSURE N/A 04/11/2018   Procedure: DILATATION & CURETTAGE/HYSTEROSCOPY WITH MYOSURE;  Surgeon: Rockney Evalene SQUIBB, MD;  Location: [facility];   Service: Gynecology;  Laterality: N/A;   GYNECOLOGIC CRYOSURGERY  age 89s   TONSILLECTOMY  age 13   TOTAL LAPAROSCOPIC HYSTERECTOMY WITH SALPINGECTOMY Bilateral 04/09/2019   Procedure: TOTAL LAPAROSCOPIC HYSTERECTOMY WITH SALPINGECTOMY;  Surgeon: Cleotilde Ronal RAMAN, MD;  Location: [facility] OR;  Service: Gynecology;  Laterality: Bilateral;  Has Mirena  IUD in place, 3 hours surgery time. Extended recovery bed.   TRANSTHORACIC ECHOCARDIOGRAM  01/17/2017   ef 60-65%/  mild TR   Past Medical History:  Diagnosis Date   Anemia    Anxiety    Arthritis    inflammatory   ASCUS with positive high risk HPV cervical  01/2019   Bladder spasm    COPD (chronic obstructive pulmonary disease) (HCC)    Fibromyalgia    GERD (gastroesophageal reflux disease)    Glaucoma    History of chronic bronchitis    last flare up 11-18-17   History of iron  deficiency anemia    History of pulmonary embolism 10/2016--- followed by pcp and pulmoloigst   dx bilateral PE , bilateral lower lobes , segmental , unprovoked, negative hypercoagulable work-up-- treated w/ eliquis    Migraine    Mild intermittent asthma    pulmologist-  dr jude   Mixed connective tissue disease    Morbid obesity with BMI of 45.0-49.9, adult (HCC)    Open-angle glaucoma of both eyes    Pre-diabetes    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   bilateral ---- stopped eliquis    Shingles    Sleep apnea    Vitamin D  deficiency    BP (!) 120/58   Pulse 84   Ht 5' 10 (1.778 m)   Wt (!) 344 lb (156 kg)   LMP 03/05/2019   SpO2 98%   BMI 49.36 kg/m   Opioid Risk Score:   Fall Risk Score:  `1  Depression screen [facility] 2/9     11/04/2023   10:17 AM 09/12/2023    9:07 AM 10/22/2022    9:01 AM 09/06/2022    9:36 AM 03/09/2022    9:54 AM 08/04/2021    9:11 AM 06/09/2020    8:14 AM  Depression screen PHQ 2/9  Decreased Interest 0 0 0 0 0 0 0  Down, Depressed, Hopeless 0 0 0 0 0 0 0  PHQ - 2 Score 0 0 0 0 0 0 0   Review of Systems  Constitutional: Negative.    HENT: Negative.    Eyes: Negative.   Respiratory:  Positive for apnea.   Cardiovascular: Negative.   Gastrointestinal: Negative.   Endocrine: Negative.   Genitourinary:  Positive for difficulty urinating.       Bladder control  Musculoskeletal:  Positive for arthralgias, back pain, myalgias and neck pain.  Skin: Negative.   Allergic/Immunologic: Negative.   Neurological:  Positive for tremors.       Tingling  Hematological: Negative.   Psychiatric/Behavioral:  The patient is nervous/anxious.   All other systems reviewed and are negative.     Objective:   Physical Exam Gen: no distress, normal appearing, BMI 45.25, weight 301 lbs.  HEENT: oral mucosa pink and moist, NCAT Cardio: Reg rate Chest: normal effort, normal rate of breathing Abd: soft, non-distended Ext: no edema Psych: pleasant, normal affect Skin: intact Neuro: Alert an oriented x3. No tremor. TTP over right right lumbar spine, diffuse tender points on upper and lower sides of the body, dorsum of Holly foot hurts, stable 11/3    Assessment & Plan:  Female with past medical history of tremors, pulmonary embolism, morbid obesity, migraines, fibromyalgia, prediabetes, asthma, fibroids, chronic bronchitis presents with pain all over.    1. Chronic diffuse pain             Multifactorial - Fibromyalgia, connective tissue disorder, inflammatory arthritis, fibroids s/p hysterectomy             Side effects with Gabapentin   -discussed that condition has been stable             Neck xray reviewed, showing mild disc space narrowing at C7-T1.             Continue Heat  Encouraged pool therapy (completed aquatic therapy), reminded again after CoVid.             Cont TENS IT with benefit, reminded             Continue Lidoderm  OTC  Discussed trigger point injections and she defers             continue Lyrica  75 BID - TID, usually taking twice daily  -continue topamax              Refilled Baclofen  10 qhs, unable  to tolerate during the day             Refilled Robaxin  500 BID PRN             Cont to follow up with Psychology    Prescribing Tens unit and supplies as needed. Treatment Rx: Daily @ 30-40 minutes per treatment PRN.  Treatment Goals: 1) To reduce and/or eliminate pain 2) To improve functional capacity and Activities of daily living 3) To reduce or prevent the need for oral medications 4) To improve circulation in the injured region 5) To decrease or prevent muscle spasm and muscle atrophy 6) To provide a self-management tool to the patient The patient has not sufficiently improved with conservative care. Numerous studies indexed by Medline and PubMed.gov have shown Neuromuscular, Interferential, and TENS stimulators to reduce pain, improve function, and reduce medication use in injured patients. Continued use of this evidence based, safe, drug free treatment is both reasonable and medically necessary at this time.    -Provided with a pain relief journal and discussed that it contains foods and lifestyle tips to naturally help to improve pain. Discussed that these lifestyle strategies are also very good for health unlike some medications which can have negative side effects. Discussed that the act of keeping a journal can be therapeutic and helpful to realize patterns what helps to trigger and alleviate pain.    -Discussed Qutenza as an option for neuropathic pain control. Discussed that this is a capsaicin patch, stronger than capsaicin cream. Discussed that it is currently approved for diabetic peripheral neuropathy and post-herpetic neuralgia, but that it has also shown benefit in treating other forms of neuropathy. Provided patient with link to site to learn more about the patch: https://www.clark.biz/. Discussed that the patch would be placed in office and benefits usually last 3 months. Discussed that unintended exposure to capsaicin can cause severe irritation of eyes, mucous membranes,  respiratory tract, and skin, but that Qutenza is a local treatment and does not have the systemic side effects of other nerve medications. Discussed that there may be pain, itching, erythema, and decreased sensory function associated with the application of Qutenza. Side effects usually subside within 1 week. A cold pack of analgesic medications can help with these side effects. Blood pressure can also be increased due to pain associated with administration of the patch.   continue 60mg  cymbalta   Infrared lamp script given.              Will consider accupuncture if necessary             Will consider NSAID if necessary  Prescribed Pamelor  10mg  daily prn for breakthrough pain -Discussed current symptoms of pain and history of pain.  -Discussed benefits of exercise in reducing pain. -Discussed following foods that may reduce pain: 1) Ginger (especially studied for arthritis)- reduce leukotriene production to decrease inflammation 2) Blueberries- high in phytonutrients that decrease inflammation 3) Salmon- marine omega-3s reduce  joint swelling and pain 4) Pumpkin seeds- reduce inflammation 5) dark chocolate- reduces inflammation 6) turmeric- reduces inflammation 7) tart cherries - reduce pain and stiffness 8) extra virgin olive oil - its compound olecanthal helps to block prostaglandins  9) chili peppers- can be eaten or applied topically via capsaicin 10) mint- helpful for headache, muscle aches, joint pain, and itching 11) garlic- reduces inflammation 12) Green tea- reduces inflammation and oxidative stress, helps with weight loss, may reduce the risk of cancer, recommend Double Green Matcha [place] of Tea daily  Link to further information on diet for chronic pain: http://www.bray.com/                2. Gait abnormality             Continue slow transitional movements   3. Sleep disturbance with OSA              Improved with meds             See #1   4. Morbid Obesity  Continue topamax  100mg  HS  Commended on 40 lbb weight loss!  Discussed that she has been watching her calories, walking her dog, discussed that she is prioritizing protein, discussed that she has been eating yogurt, cottage cheese, almonds  Discussed that her breakfast is typically yogurt and cottage cheese- discussed that she uses greek yogurt   5. Myalgia                     Will consider trigger point injections when able to tolerate             See #1  Not needed at present   6. Migraines           ContinueTopamax             See #1.   Foods to alleviate migraine:  1) dark leafy greens 2) avocado 3) tuna 4) samon and mackerel 5) beans and legumes Supplements that can be helpful: feverfew, B12, and magnesium  Foods to avoid in migraine: 1) Excessive (or irregular timing) coffee 2) red wine 3) aged cheeses 4) chocolate 5) citrus fruits 6) aspartame and other artifical sweeteners 7) yeast 8) MSG (in processed foods) 9) processed and cured meats 10) nuts and certain seeds 11) chicken livers and other organ meats 12) dairy products like buttermilk, sour cream, and yogurt 13) dried fruits like dates, figs, and raisins 14) garlic 15) onions 16) potato chips 17) pickled foods like olives and sauerkraut 18) some fresh fruits like ripe banana, papaya, red plums, raspberries, kiwi, pineapple 19) tomato-based products  Recommend to keep a migraine diary: rate daily the severity of your headache (1-10) and what foods you eat that day to help determine patterns.     7. Connective tissue disorder with rheumatoid arthritis             Continue to be worked up by Rheum             See #1             Plaquenil  DC'd, infusion started           continue infusion  Discussed Wahls protocol  8. Tremor: Discussed propanolol but we decided not severe enough now to consider.   9. Vitamin D  deficiency: -supplement removed  by rheumatology.  -discussed how her level went to 125 in [place].  -discussed how she felt that time.  -discussed that her PCP will check  this.   10. Holly Mcdonald: -continue ibuprofen  prn -discussed red light therapy.

## 2024-03-12 NOTE — Patient Instructions (Signed)
 Foods that may reduce pain: 1) Ginger (especially studied for arthritis)- reduce leukotriene production to decrease inflammation 2) Blueberries- high in phytonutrients that decrease inflammation 3) Salmon- marine omega-3s reduce joint swelling and pain 4) Pumpkin seeds- reduce inflammation 5) dark chocolate- reduces inflammation 6) turmeric- reduces inflammation 7) tart cherries - reduce pain and stiffness 8) extra virgin olive oil - its compound olecanthal helps to block prostaglandins  9) chili peppers- can be eaten or applied topically via capsaicin 10) mint- helpful for headache, muscle aches, joint pain, and itching 11) garlic- reduces inflammation 12) Green tea- reduces inflammation and oxidative stress, helps with weight loss, may reduce the risk of cancer, recommend Double Green Matcha [place] of Tea daily  Link to further information on diet for chronic pain: http://www.bray.com/

## 2024-03-19 ENCOUNTER — Encounter: Payer: Self-pay | Admitting: Physical Medicine and Rehabilitation

## 2024-03-21 ENCOUNTER — Telehealth: Payer: Self-pay | Admitting: *Deleted

## 2024-03-21 DIAGNOSIS — G4733 Obstructive sleep apnea (adult) (pediatric): Secondary | ICD-10-CM

## 2024-03-21 NOTE — Telephone Encounter (Signed)
 Copied from CRM [id number]. Topic: Clinical - Medication Question >> Mar 19, 2024  4:51 PM Lavanda D wrote: Reason for CRM: Patient is calling to advise that her insurance does cover the zepbound with OSA and a PA, she would like to see if she can get going on this. Patient would like a call back to discuss steps.  Tammy, pt is asking about Zepbound rx. It looks like this was not discussed at the last visit. Are you willing to send rx or would you like me to schedule ov?

## 2024-03-21 NOTE — Telephone Encounter (Signed)
 She will need a repeat sleep study as her previous sleep study showed mild sleep apnea and Zepbound is only covered for moderate sleep apnea if she would like to have a repeat home sleep study I can order this or you can put a home sleep study in if she would like.  Will need an office visit after sleep study to discuss results and evaluate for Zepbound

## 2024-03-23 NOTE — Telephone Encounter (Signed)
 I called and spoke to pt. Pt informed of Tammy's note and verbalized understanding. I have ordered the HST as agreed by the pt. Pt will call and schedule an appt once she has turn in the test. NFN

## 2024-04-05 ENCOUNTER — Encounter

## 2024-04-05 DIAGNOSIS — G4733 Obstructive sleep apnea (adult) (pediatric): Secondary | ICD-10-CM

## 2024-04-16 ENCOUNTER — Other Ambulatory Visit: Payer: Self-pay | Admitting: Allergy

## 2024-04-17 ENCOUNTER — Telehealth: Payer: Self-pay | Admitting: Pulmonary Disease

## 2024-04-17 DIAGNOSIS — G4733 Obstructive sleep apnea (adult) (pediatric): Secondary | ICD-10-CM | POA: Diagnosis not present

## 2024-04-17 NOTE — Telephone Encounter (Signed)
 Call patient  Sleep study result  Date of study: 04/05/2024  Impression: Moderate obstructive sleep apnea, AHI of 16.6. Moderate oxygen  desaturations with O2 nadir of 83%, saturations were below 88% for 10 minutes, 3% of the recording  Recommendation: DME referral  Recommend CPAP therapy for moderate obstructive sleep apnea.  Auto-titrating CPAP with pressure settings of 5-15 should be appropriate, along with heated humidification using the patient's preferred mask.  Encourage weight loss measures.  Schedule follow-up in the office 4 to 6 weeks after initiation of treatment

## 2024-04-19 ENCOUNTER — Telehealth: Payer: Self-pay

## 2024-04-19 NOTE — Telephone Encounter (Signed)
 Copied from CRM [id number]. Topic: Clinical - Lab/Test Results >> Apr 17, 2024  4:23 PM Rilla B wrote: Reason for CRM: Patient received notice on Mychart regarding her sleep results and would like a call to go it. Please call patient @ [phone]. Okay to leave VM  ----------------------------------------------------------------------- From previous Reason for Contact - Other: Reason for CRM:   Spoke with patient VBU, results have not been reviewed  by provider. Once they are reviewed someone will reach out to go over them with her

## 2024-04-20 ENCOUNTER — Ambulatory Visit: Payer: Self-pay | Admitting: Adult Health

## 2024-04-30 ENCOUNTER — Ambulatory Visit: Admitting: Allergy

## 2024-04-30 ENCOUNTER — Encounter: Payer: Self-pay | Admitting: Allergy

## 2024-04-30 ENCOUNTER — Other Ambulatory Visit: Payer: Self-pay

## 2024-04-30 VITALS — BP 118/80 | HR 90 | Temp 98.3°F | Ht 70.0 in | Wt 342.4 lb

## 2024-04-30 DIAGNOSIS — J3089 Other allergic rhinitis: Secondary | ICD-10-CM | POA: Diagnosis not present

## 2024-04-30 DIAGNOSIS — H1013 Acute atopic conjunctivitis, bilateral: Secondary | ICD-10-CM

## 2024-04-30 DIAGNOSIS — J3489 Other specified disorders of nose and nasal sinuses: Secondary | ICD-10-CM | POA: Diagnosis not present

## 2024-04-30 DIAGNOSIS — J4520 Mild intermittent asthma, uncomplicated: Secondary | ICD-10-CM | POA: Diagnosis not present

## 2024-04-30 MED ORDER — MONTELUKAST SODIUM 10 MG PO TABS: 10.0000 mg | ORAL_TABLET | Freq: Every day | ORAL | 3 refills | Status: AC

## 2024-04-30 NOTE — Progress Notes (Signed)
 "  Follow Up Note  RE: Holly Mcdonald MRN: [id number] DOB: March 12, 1976 Date of Office Visit: 04/30/2024  Referring provider: Gerome Brunet, DO Primary care provider: Gerome Brunet, DO  Chief Complaint: Asthma (No issues)  History of Present Illness: I had the pleasure of seeing Holly Mcdonald for a follow up visit at the [facility] on 04/30/2024. She is a 48 y.o. female, who is being followed for allergic rhinoconjunctivitis and asthma. Her previous allergy office visit was on 10/19/2023 with Dr. Luke. Today is a regular follow up visit.  Discussed the use of AI scribe software for clinical note transcription with the patient, who gave verbal consent to proceed.    Asthma is stable with no recent exacerbations. No wheezing, coughing, shortness of breath, or chest tightness. She traveled to [place] recently without any respiratory issues. Currently has Airsupra , but has not needed it recently. Also takes montelukast  and cetirizine  for asthma and environmental allergies, which are under control.  She has a persistent nasal sore that has not healed completely. Occasionally uses Bactroban , which helps when applied consistently, but often forgets to use it. Does not use any nasal sprays regularly, although has used Flonase  occasionally in the past. The sore is not extremely bothersome unless the scab becomes large and obstructs breathing.  Diagnosed with alopecia and has chosen not to pursue medical treatment, opting instead to keep her hair cut short. Stopped taking methotrexate after the diagnosis of alopecia.   No fevers, chills, or changes in appetite. She reports a new diagnosis of alopecia since her last visit. Occasionally uses prednisone  as needed for flare-ups.     Assessment and Plan: Holly Mcdonald is a 48 y.o. female with: Other allergic rhinitis Allergic conjunctivitis of both eyes Well-controlled with Cetirizine  and montelukast .  Continue avoidance  measures for tree pollen, molds, dust mites and cockroach.  Use over the counter antihistamines such as Zyrtec  (cetirizine ), Claritin  (loratadine ), Allegra (fexofenadine), or Xyzal (levocetirizine) daily as needed. May take twice a day during allergy flares. May switch antihistamines every few months. Singulair  (montelukast ) 10mg  daily at night. Nasal saline spray (i.e., Simply Saline) or nasal saline lavage (i.e., NeilMed) is recommended as needed and prior to medicated nasal sprays.   Nasal sore Chronic nasal sore. No ENT consultation yet. Use saline gel/vaseline. Use cool mist humidifer.  Consider referral to ENT if symptoms get worse.    Mild intermittent asthma without complication Past history - Stopped daily inhaler Breo months ago with no flare. Usually respiratory infections flares her symptoms. Interim history - well-controlled, no recent symptoms, inhaler unused. May use Airsupra  rescue inhaler 2 puffs every 4 to 6 hours as needed for shortness of breath, chest tightness, coughing, and wheezing. Do not use more than 12 puffs in 24 hours. May use Airsupra  rescue inhaler 2 puffs 5 to 15 minutes prior to strenuous physical activities. Rinse mouth after each use.  Monitor frequency of use - if you need to use it more than twice per week on a consistent basis let us  know.   Return in about 6 months (around 10/29/2024).  Meds ordered this encounter  Medications   montelukast  (SINGULAIR ) 10 MG tablet    Sig: Take 1 tablet (10 mg total) by mouth at bedtime.    Dispense:  90 tablet    Refill:  3   Lab Orders  No laboratory test(s) ordered today    Diagnostics: None.   Medication List:  Current Outpatient Medications  Medication Sig Dispense Refill  Albuterol -Budesonide (AIRSUPRA ) 90-80 MCG/ACT AERO Inhale 2 puffs into the lungs every 4 (four) hours as needed (coughing, wheezing, chest tightness). Do not exceed 12 puffs in 24 hours. 10.7 g 2   baclofen  (LIORESAL ) 10 MG tablet  TAKE 1 TABLET BY MOUTH 3 TIMES A DAY AS NEEDED FOR MUSCLE SPASMS 90 tablet 5   benzonatate  (TESSALON ) 100 MG capsule Take 1 capsule (100 mg total) by mouth every 8 (eight) hours. 21 capsule 0   brimonidine-timolol (COMBIGAN) 0.2-0.5 % ophthalmic solution Apply to eye.     cetirizine  (ZYRTEC ) 10 MG tablet Take 1 tablet (10 mg total) by mouth every morning. 30 tablet 5   Cyanocobalamin  (VITAMIN B 12 PO) Take 1,000 mg by mouth daily.      DULoxetine  (CYMBALTA ) 60 MG capsule TAKE ONE CAPSULE BY MOUTH DAILY 30 capsule 2   ELIQUIS  5 MG TABS tablet TAKE ONE TABLET BY MOUTH TWICE A DAY 60 tablet 2   golimumab (SIMPONI ARIA) 50 MG/4ML SOLN injection Inject into the vein every 8 (eight) weeks.     ipratropium (ATROVENT ) 0.03 % nasal spray Place 2 sprays into both nostrils 3 (three) times daily as needed (nasal drainage, throat clearing). 30 mL 5   lidocaine  (XYLOCAINE ) 5 % ointment Apply 1 application topically as needed. 35.44 g 0   methocarbamol  (ROBAXIN ) 500 MG tablet Take 1 tablet (500 mg total) by mouth 2 (two) times daily as needed for muscle spasms. 60 tablet 1   mupirocin  ointment (BACTROBAN ) 2 % PLACE 1 APPLICATION INTO THE NOSE TWO TIMES A DAY FOR 10 DAYS Strength: 2 % 22 g 0   nortriptyline  (PAMELOR ) 10 MG capsule TAKE ONE CAPSULE BY MOUTH DAILY AS NEEDED FOR SLEEP 30 capsule 3   omeprazole  (PRILOSEC) 40 MG capsule TAKE 1 CAPSULE(40 MG) BY MOUTH DAILY 90 capsule 2   oxybutynin  (DITROPAN ) 5 MG tablet Take 5 mg by mouth 2 (two) times daily.   11   pregabalin  (LYRICA ) 75 MG capsule Take 1 capsule (75 mg total) by mouth 2 (two) times daily. 60 capsule 3   topiramate  (TOPAMAX ) 100 MG tablet Take 1 tablet (100 mg total) by mouth at bedtime. 90 tablet 3   Vitamin D , Ergocalciferol , (DRISDOL ) 1.25 MG (50000 UNIT) CAPS capsule TAKE 1 CAPSULE BY MOUTH EVERY 10 DAYS (Patient taking differently: Take 50,000 Units by mouth every Friday.) 3 capsule 4   cyclobenzaprine (FLEXERIL) 10 MG tablet Take 10 mg by mouth  3 (three) times daily. (Patient not taking: Reported on 04/30/2024)     folic acid (FOLVITE) 1 MG tablet Take 2 mg by mouth daily.  (Patient not taking: Reported on 04/30/2024)     methotrexate 2.5 MG tablet Take 15 mg by mouth once a week. Friday in the evening (Patient not taking: Reported on 04/30/2024)     montelukast  (SINGULAIR ) 10 MG tablet Take 1 tablet (10 mg total) by mouth at bedtime. 90 tablet 3   predniSONE  (DELTASONE ) 20 MG tablet Take 20 mg by mouth 2 (two) times daily. (Patient not taking: Reported on 04/30/2024)     predniSONE  (DELTASONE ) 50 MG tablet Take 1 tablet (50 mg total) by mouth every evening. (Patient not taking: Reported on 04/30/2024) 6 tablet 0   No current facility-administered medications for this visit.   Allergies: Allergies[1] I reviewed her past medical history, social history, family history, and environmental history and no significant changes have been reported from her previous visit.  Review of Systems  Constitutional:  Negative for appetite change,  chills, fever and unexpected weight change.  HENT:  Negative for congestion and rhinorrhea.   Eyes:  Negative for itching.  Respiratory:  Negative for cough, chest tightness, shortness of breath and wheezing.   Cardiovascular:  Negative for chest pain.  Gastrointestinal:  Negative for abdominal pain.  Genitourinary:  Negative for difficulty urinating.  Skin:  Negative for rash.  Allergic/Immunologic: Positive for environmental allergies.    Objective: BP 118/80 (BP Location: Right Arm, Patient Position: Sitting)   Pulse 90   Temp 98.3 F (36.8 C) (Temporal)   Ht 5' 10 (1.778 m)   Wt (!) 342 lb 6.4 oz (155.3 kg)   LMP 03/05/2019   SpO2 98%   BMI 49.13 kg/m  Body mass index is 49.13 kg/m. Physical Exam Vitals and nursing note reviewed.  Constitutional:      Appearance: Normal appearance. She is well-developed.  HENT:     Head: Normocephalic and atraumatic.     Right Ear: Tympanic membrane  and external ear normal.     Left Ear: Tympanic membrane and external ear normal.     Nose:     Comments: Irritation of nares with some bloody spots.     Mouth/Throat:     Mouth: Mucous membranes are moist.     Pharynx: Oropharynx is clear.  Eyes:     Conjunctiva/sclera: Conjunctivae normal.  Cardiovascular:     Rate and Rhythm: Normal rate and regular rhythm.     Heart sounds: Normal heart sounds. No murmur heard.    No friction rub. No gallop.  Pulmonary:     Effort: Pulmonary effort is normal.     Breath sounds: Normal breath sounds. No wheezing, rhonchi or rales.  Musculoskeletal:     Cervical back: Neck supple.  Skin:    General: Skin is warm.     Findings: No rash.  Neurological:     Mental Status: She is alert and oriented to person, place, and time.  Psychiatric:        Behavior: Behavior normal.    Previous notes and tests were reviewed. The plan was reviewed with the patient/family, and all questions/concerned were addressed.  It was my pleasure to see Holly Mcdonald today and participate in her care. Please feel free to contact me with any questions or concerns.  Sincerely,  Orlan Cramp, DO Allergy & Immunology  [facility]  [place] office: [phone] [place] office: [phone]     [1]  Allergies Allergen Reactions   Other Nausea And Vomiting, Nausea Only and Other (See Comments)    Darvocet N-100  Other reaction(s): Vomiting   Propoxyphene Nausea And Vomiting and Nausea Only   "

## 2024-04-30 NOTE — Patient Instructions (Addendum)
 Asthma May use Airsupra  rescue inhaler 2 puffs every 4 to 6 hours as needed for shortness of breath, chest tightness, coughing, and wheezing. Do not use more than 12 puffs in 24 hours. May use Airsupra  rescue inhaler 2 puffs 5 to 15 minutes prior to strenuous physical activities. Rinse mouth after each use.  Monitor frequency of use - if you need to use it more than twice per week on a consistent basis let us  know.  Breathing control goals:  Full participation in all desired activities (may need albuterol  before activity) Albuterol  use two times or less a week on average (not counting use with activity) Cough interfering with sleep two times or less a month Oral steroids no more than once a year No hospitalizations   Environmental allergies  Continue avoidance measures for tree pollen, molds, dust mites and cockroach.  Use over the counter antihistamines such as Zyrtec  (cetirizine ), Claritin  (loratadine ), Allegra (fexofenadine), or Xyzal (levocetirizine) daily as needed. May take twice a day during allergy flares. May switch antihistamines every few months. Singulair  (montelukast ) 10mg  daily at night. Nasal saline spray (i.e., Simply Saline) or nasal saline lavage (i.e., NeilMed) is recommended as needed and prior to medicated nasal sprays.  Nasal sore Use saline gel/vaseline. Use cool mist humidifer.  Consider referral to ENT if symptoms get worse.   Follow up in 6 months or sooner if needed.   Reducing Pollen Exposure Pollen seasons: trees (spring), grass (summer) and ragweed/weeds (fall). Keep windows closed in your home and car to lower pollen exposure.  Install air conditioning in the bedroom and throughout the house if possible.  Avoid going out in dry windy days - especially early morning. Pollen counts are highest between 5 - 10 AM and on dry, hot and windy days.  Save outside activities for late afternoon or after a heavy rain, when pollen levels are lower.  Avoid mowing of  grass if you have grass pollen allergy. Be aware that pollen can also be transported indoors on people and pets.  Dry your clothes in an automatic dryer rather than hanging them outside where they might collect pollen.  Rinse hair and eyes before bedtime.  Control of House Dust Mite Allergen Dust mite allergens are a common trigger of allergy and asthma symptoms. While they can be found throughout the house, these microscopic creatures thrive in warm, humid environments such as bedding, upholstered furniture and carpeting. Because so much time is spent in the bedroom, it is essential to reduce mite levels there.  Encase pillows, mattresses, and box springs in special allergen-proof fabric covers or airtight, zippered plastic covers.  Bedding should be washed weekly in hot water (130 F) and dried in a hot dryer. Allergen-proof covers are available for comforters and pillows that cant be regularly washed.  Wash the allergy-proof covers every few months. Minimize clutter in the bedroom. Keep pets out of the bedroom.  Keep humidity less than 50% by using a dehumidifier or air conditioning. You can buy a humidity measuring device called a hygrometer to monitor this.  If possible, replace carpets with hardwood, linoleum, or washable area rugs. If that's not possible, vacuum frequently with a vacuum that has a HEPA filter. Remove all upholstered furniture and non-washable window drapes from the bedroom. Remove all non-washable stuffed toys from the bedroom.  Wash stuffed toys weekly.  Mold Control Mold and fungi can grow on a variety of surfaces provided certain temperature and moisture conditions exist.  Outdoor molds grow on plants, decaying  vegetation and soil. The major outdoor mold, Alternaria and Cladosporium, are found in very high numbers during hot and dry conditions. Generally, a late summer - fall peak is seen for common outdoor fungal spores. Rain will temporarily lower outdoor mold spore  count, but counts rise rapidly when the rainy period ends. The most important indoor molds are Aspergillus and Penicillium. Dark, humid and poorly ventilated basements are ideal sites for mold growth. The next most common sites of mold growth are the bathroom and the kitchen. Outdoor (Seasonal) Mold Control Use air conditioning and keep windows closed. Avoid exposure to decaying vegetation. Avoid leaf raking. Avoid grain handling. Consider wearing a face mask if working in moldy areas.  Indoor (Perennial) Mold Control  Maintain humidity below 50%. Get rid of mold growth on hard surfaces with water, detergent and, if necessary, 5% bleach (do not mix with other cleaners). Then dry the area completely. If mold covers an area more than 10 square feet, consider hiring an indoor environmental professional. For clothing, washing with soap and water is best. If moldy items cannot be cleaned and dried, throw them away. Remove sources e.g. contaminated carpets. Repair and seal leaking roofs or pipes. Using dehumidifiers in damp basements may be helpful, but empty the water and clean units regularly to prevent mildew from forming. All rooms, especially basements, bathrooms and kitchens, require ventilation and cleaning to deter mold and mildew growth. Avoid carpeting on concrete or damp floors, and storing items in damp areas. Cockroach Allergen Avoidance Cockroaches are often found in the homes of densely populated urban areas, schools or commercial buildings, but these creatures can lurk almost anywhere. This does not mean that you have a dirty house or living area. Block all areas where roaches can enter the home. This includes crevices, wall cracks and windows.  Cockroaches need water to survive, so fix and seal all leaky faucets and pipes. Have an exterminator go through the house when your family and pets are gone to eliminate any remaining roaches. Keep food in lidded containers and put pet food dishes  away after your pets are done eating. Vacuum and sweep the floor after meals, and take out garbage and recyclables. Use lidded garbage containers in the kitchen. Wash dishes immediately after use and clean under stoves, refrigerators or toasters where crumbs can accumulate. Wipe off the stove and other kitchen surfaces and cupboards regularly.

## 2024-05-28 ENCOUNTER — Telehealth: Payer: Self-pay

## 2024-05-28 NOTE — Telephone Encounter (Signed)
 Patient is not using cpap,did not know if she was supposed to use it,coming in to follow up on home sleep test and talk about zepbound 

## 2024-05-29 ENCOUNTER — Ambulatory Visit: Admitting: Adult Health

## 2024-05-29 ENCOUNTER — Encounter: Payer: Self-pay | Admitting: Adult Health

## 2024-05-29 VITALS — BP 108/72 | HR 91 | Temp 97.8°F | Ht 70.0 in | Wt 342.8 lb

## 2024-05-29 DIAGNOSIS — J45909 Unspecified asthma, uncomplicated: Secondary | ICD-10-CM

## 2024-05-29 DIAGNOSIS — M069 Rheumatoid arthritis, unspecified: Secondary | ICD-10-CM

## 2024-05-29 DIAGNOSIS — Z6841 Body Mass Index (BMI) 40.0 and over, adult: Secondary | ICD-10-CM | POA: Diagnosis not present

## 2024-05-29 DIAGNOSIS — Z86711 Personal history of pulmonary embolism: Secondary | ICD-10-CM

## 2024-05-29 DIAGNOSIS — G4733 Obstructive sleep apnea (adult) (pediatric): Secondary | ICD-10-CM

## 2024-05-29 DIAGNOSIS — M1380 Other specified arthritis, unspecified site: Secondary | ICD-10-CM

## 2024-05-29 MED ORDER — ZEPBOUND 2.5 MG/0.5ML ~~LOC~~ SOAJ: 2.5000 mg | SUBCUTANEOUS | Status: AC

## 2024-05-29 NOTE — Patient Instructions (Addendum)
 Restart CPAP At bedtime. Try to wear for at least 4hr or more each and every night .  Work on healthy weight loss.  Do not drive if sleepy .  Give feedback how your doing on CPAP   Begin Zepbound  2.5mg  injection weekly.  Healthy diet and exercise. Push fluids Stool softners As needed  constipation  Miralax  if needed constipation.     Continue on Airsupra  As needed   ,rinse after use.  Continue on Singulair  10mg  At bedtime   Continue on Zyrtec  10mg  At bedtime   Continue on Flonase  daily As needed    Continue on Eliquis  Twice daily    Follow up with Dr. Jude or Congetta Odriscoll NP  in 4 weeks and As needed  -Virtual Visit    Notify your provider if you are planning to have a procedure/surgery, as this medication will need to be stopped prior.

## 2024-05-29 NOTE — Progress Notes (Signed)
 "  @Patient  ID: Holly Mcdonald, female    DOB: 1975/05/14, 49 y.o.   MRN: [id number]  Chief Complaint  Patient presents with   Obstructive Sleep Apnea    Referring provider: Gerome Brunet, DO  HPI: 49 year old female never smoker seen for consult October 2025 to reestablish for sleep apnea and history of unprovoked bilateral PE diagnosed in 2018 treated with Eliquis  discontinued briefly due to retinal hemorrhages, Eliquis  restarted 2019 for lifelong anticoagulation therapy History of asthma and chronic allergies followed by asthma and allergy-Dr. Jeneal Medical history significant for fibromyalgia, IDA, rheumatoid arthritis/early mixed connective tissue disease followed by rheumatology Works in [employer]   TEST/EVENTS : Reviewed 05/29/2024  Home sleep study February 14, 2019 showed mild to moderate obstructive sleep apnea with AHI 9.3/hour (events were worse in supine position-16/hours   CT chest June 2018 acute segmental pulmonary embolism in the lower lobes bilaterally, no evidence of right heart strain, small left pleural effusion   Discussed the use of AI scribe software for clinical note transcription with the patient, who gave verbal consent to proceed.  History of Present Illness Holly Mcdonald is a 49 year old female with moderate sleep apnea who presents for follow-up regarding CPAP use and potential treatment options.  She has a history of sleep apnea, initially diagnosed as mild in 2020, which has progressed to moderate severity. She experiences difficulty using her CPAP machine due to discomfort with the mask, stating it is hard to get used to. A recent sleep study showed moderate OSA with AHI 16/hr, SpO2 low at 83%. She has not been using her CPAP regularly due to these issues.  She continues to struggle with weight loss, has tried many different avenues with dietary and exercise but has been unsuccessful.  She is considering alternative  treatments for her sleep apnea, including an oral appliance and weight loss.  She has not used GLP-1 medications before and is considering Zepbound , which is approved for sleep apnea and weight loss.  She has a history of unprovoked bilateral pulmonary embolism diagnosed in 2018 on lifelong anticoagulation therapy with Eliquis .  Says she is doing well.  Denies any known bleeding.     She has rheumatoid arthritis, mixed connective tissue disease, and fibromyalgia.  Followed by rheumatology.  She experiences alopecia, which she attributes to her autoimmune conditions. No history of severe liver disease, pancreatitis, or multiple endocrine neoplasia syndrome. She reports being achy in cold weather. She remains on Simponi.  She is followed by rheumatology.  Occasionally has to use prednisone .  She has no personal or family history of thyroid  cancer. She underwent a hysterectomy five years ago.  No history of MEN Syndrome.   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  Recent lab work reviewed on her mobile app with [facility] revealing normal electrolyte panel with kidney function and liver function, hemoglobin and hematocrit.  Thyroid  with TSH normal.  Cholesterol total 212, LDH 151.  Triglycerides 66, HDL 49.   Allergies[1]  Immunization History  Administered Date(s) Administered   Influenza Inj Mdck Quad Pf 07/05/2016   Influenza,inj,Quad PF,6+ Mos 01/25/2017, 02/15/2018   Influenza-Unspecified 07/05/2016, 02/02/2019   PFIZER(Purple Top)SARS-COV-2 Vaccination 07/09/2019, 07/25/2019, 08/09/2019, 08/18/2019, 02/08/2020, 07/08/2020   Pneumococcal Polysaccharide-23 11/04/2016   Tdap 05/10/2012    Past Medical History:  Diagnosis Date   Anemia    Anxiety    Arthritis    inflammatory   ASCUS with positive high risk HPV cervical  01/2019   Bladder spasm    COPD (chronic obstructive pulmonary disease) (HCC)    Fibromyalgia    GERD (gastroesophageal  reflux disease)    Glaucoma    History of chronic bronchitis    last flare up 11-18-17   History of iron  deficiency anemia    History of pulmonary embolism 10/2016--- followed by pcp and pulmoloigst   dx bilateral PE , bilateral lower lobes , segmental , unprovoked, negative hypercoagulable work-up-- treated w/ eliquis    Migraine    Mild intermittent asthma    pulmologist-  dr jude   Mixed connective tissue disease    Morbid obesity with BMI of 45.0-49.9, adult (HCC)    Open-angle glaucoma of both eyes    Pre-diabetes    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   bilateral ---- stopped eliquis    Shingles    Sleep apnea    Vitamin D  deficiency     Tobacco History: Tobacco Use History[2] Counseling given: Not Answered   Outpatient Medications Prior to Visit  Medication Sig Dispense Refill   Albuterol -Budesonide (AIRSUPRA ) 90-80 MCG/ACT AERO Inhale 2 puffs into the lungs every 4 (four) hours as needed (coughing, wheezing, chest tightness). Do not exceed 12 puffs in 24 hours. 10.7 g 2   baclofen  (LIORESAL ) 10 MG tablet TAKE 1 TABLET BY MOUTH 3 TIMES A DAY AS NEEDED FOR MUSCLE SPASMS 90 tablet 5   benzonatate  (TESSALON ) 100 MG capsule Take 1 capsule (100 mg total) by mouth every 8 (eight) hours. 21 capsule 0   brimonidine-timolol (COMBIGAN) 0.2-0.5 % ophthalmic solution Apply to eye.     cetirizine  (ZYRTEC ) 10 MG tablet Take 1 tablet (10 mg total) by mouth every morning. 30 tablet 5   Cyanocobalamin  (VITAMIN B 12 PO) Take 1,000 mg by mouth daily.      DULoxetine  (CYMBALTA ) 60 MG capsule TAKE ONE CAPSULE BY MOUTH DAILY 30 capsule 2   ELIQUIS  5 MG TABS tablet TAKE ONE TABLET BY MOUTH TWICE A DAY 60 tablet 2   golimumab (SIMPONI ARIA) 50 MG/4ML SOLN injection Inject into the vein every 8 (eight) weeks.     ipratropium (ATROVENT ) 0.03 % nasal spray Place 2 sprays into both nostrils 3 (three) times daily as needed (nasal drainage, throat clearing). 30 mL 5   lidocaine  (XYLOCAINE ) 5 % ointment Apply 1  application topically as needed. 35.44 g 0   methocarbamol  (ROBAXIN ) 500 MG tablet Take 1 tablet (500 mg total) by mouth 2 (two) times daily as needed for muscle spasms. 60 tablet 1   montelukast  (SINGULAIR ) 10 MG tablet Take 1 tablet (10 mg total) by mouth at bedtime. 90 tablet 3   mupirocin  ointment (BACTROBAN ) 2 % PLACE 1 APPLICATION INTO THE NOSE TWO TIMES A DAY FOR 10 DAYS Strength: 2 % 22 g 0   nortriptyline  (PAMELOR ) 10 MG capsule TAKE ONE CAPSULE BY MOUTH DAILY AS NEEDED FOR SLEEP 30 capsule 3   omeprazole  (PRILOSEC) 40 MG capsule TAKE 1 CAPSULE(40 MG) BY MOUTH DAILY 90 capsule 2   oxybutynin  (DITROPAN ) 5 MG tablet Take 5 mg by mouth 2 (two) times daily.   11   pregabalin  (LYRICA ) 75 MG capsule Take 1 capsule (75 mg total) by mouth 2 (two) times daily. 60 capsule 3   topiramate  (TOPAMAX ) 100 MG tablet Take 1 tablet (100 mg total) by mouth at bedtime. 90 tablet 3   Vitamin D , Ergocalciferol , (DRISDOL ) 1.25 MG (50000 UNIT) CAPS capsule TAKE 1 CAPSULE BY MOUTH EVERY 10 DAYS (Patient taking differently: Take  50,000 Units by mouth every Friday.) 3 capsule 4   cyclobenzaprine (FLEXERIL) 10 MG tablet Take 10 mg by mouth 3 (three) times daily. (Patient not taking: Reported on 05/29/2024)     predniSONE  (DELTASONE ) 20 MG tablet Take 20 mg by mouth 2 (two) times daily. (Patient not taking: Reported on 05/29/2024)     predniSONE  (DELTASONE ) 50 MG tablet Take 1 tablet (50 mg total) by mouth every evening. (Patient not taking: Reported on 05/29/2024) 6 tablet 0   folic acid (FOLVITE) 1 MG tablet Take 2 mg by mouth daily.  (Patient not taking: Reported on 04/30/2024)     methotrexate 2.5 MG tablet Take 15 mg by mouth once a week. Friday in the evening (Patient not taking: Reported on 04/30/2024)     No facility-administered medications prior to visit.     Review of Systems:   Constitutional:   No  weight loss, night sweats,  Fevers, chills,+ fatigue, or  lassitude.  HEENT:   No headaches,  Difficulty  swallowing,  Tooth/dental problems, or  Sore throat,                No sneezing, itching, ear ache, nasal congestion, post nasal drip,   CV:  No chest pain,  Orthopnea, PND, swelling in lower extremities, anasarca, dizziness, palpitations, syncope.   GI  No heartburn, indigestion, abdominal pain, nausea, vomiting, diarrhea, change in bowel habits, loss of appetite, bloody stools.   Resp: No shortness of breath with exertion or at rest.  No excess mucus, no productive cough,  No non-productive cough,  No coughing up of blood.  No change in color of mucus.  No wheezing.  No chest wall deformity  Skin: no rash or lesions.,  Alopecia  GU: no dysuria, change in color of urine, no urgency or frequency.  No flank pain, no hematuria   MS: Chronic joint pain   Physical Exam  BP 108/72   Pulse 91   Temp 97.8 F (36.6 C)   Ht 5' 10 (1.778 m) Comment: Per pt  Wt (!) 342 lb 12.8 oz (155.5 kg)   LMP 03/05/2019   SpO2 97% Comment: RA  BMI 49.19 kg/m   GEN: A/Ox3; pleasant , NAD, well nourished    HEENT:  [place]/AT,  NOSE-clear, THROAT-clear, no lesions, no postnasal drip or exudate noted.   NECK:  Supple w/ fair ROM; no JVD; normal carotid impulses w/o bruits; no thyromegaly or nodules palpated; no lymphadenopathy.    RESP  Clear  P & A; w/o, wheezes/ rales/ or rhonchi. no accessory muscle use, no dullness to percussion  CARD:  RRR, no m/r/g, no peripheral edema, pulses intact, no cyanosis or clubbing.  GI:   Soft & nt; nml bowel sounds; no organomegaly or masses detected.   Musco: Warm bil, no deformities or joint swelling noted.   Neuro: alert, no focal deficits noted.    Skin: Warm, no lesions or rashes    Lab Results:Reviewed 05/29/2024   CBC    BNP No results found for: BNP  ProBNP No results found for: PROBNP  Imaging: No results found.  Administration History     None          Latest Ref Rng & Units 01/26/2019    8:57 AM  PFT Results  FVC-Pre L 3.59   P  FVC-Predicted Pre % 95  P  FVC-Post L 3.38  P  FVC-Predicted Post % 90  P  Pre FEV1/FVC % % 85  P  Post FEV1/FCV % % 90  P  FEV1-Pre L 3.04  P  FEV1-Predicted Pre % 99  P  FEV1-Post L 3.04  P  DLCO uncorrected ml/min/mmHg 28.07  P  DLCO UNC% % 106  P  DLVA Predicted % 138  P  TLC L 5.01  P  TLC % Predicted % 84  P  RV % Predicted % 69  P    P Preliminary result    Lab Results  Component Value Date   NITRICOXIDE 25 05/08/2018       02/27/2024    9:00 AM 01/26/2019   10:00 AM  Results of the [facility] flowsheet  Sitting and reading 3 3  Watching TV 1 3  Sitting, inactive in a public place (e.g. a theatre or a meeting) 1 1  As a passenger in a car for an hour without a break 3 3  Lying down to rest in the afternoon when circumstances permit 3 3  Sitting and talking to someone 1 0  Sitting quietly after a lunch without alcohol 1 1  In a car, while stopped for a few minutes in traffic 1 1  Total score 14 15        Assessment & Plan:   Assessment and Plan Assessment & Plan Obstructive sleep apnea  -ongoing symptoms of sleep apnea with snoring and daytime sleepiness.  Sleep study shows worsening sleep apnea now with moderate sleep apnea with AHI at 16/hour.  Previous CPAP use was challenging due to mask issues. Current options include CPAP, oral appliance, and weight loss with GLP-1 agonist (Zepbound ). Encouraged to use CPAP with the current mask. An oral appliance will be considered if CPAP is not tolerated. Weight loss options, including GLP-1 agonist (Zepbound ), were discussed.  She will try CPAP again.  Patient is to give us  some feedback and can make CPAP changes as needed  Patient has moderate sleep apnea related to obesity with BMI >/30  posing significant cardiovascular risks. Patient has failed traditional weight loss measures with caloric deficit and consistent exercise of 150 min/week for >/6 months. Patient will be initiated on Zepbound  (tirzepatide ) for weight  management. Zepbound  is the only pharmaceutical treatment approved for moderate-to-severe OSA in adults who are overweight (BMI >/27) or obese (BMI >/30). The patient will continue lifestyle modifications, including structured nutrition and physical activity as directed. No other GLP1 therapy will be used simultaneously at this time. The patient does not have any FDA labeled contraindications to this agent, including pregnancy, lactation, hx or family history of medullary thyroid  cancer, or multiple endocrine neoplasia type II. Side effect profile has been reviewed with patient. Aware of red flag symptoms to notify of immediately or seek emergency care, including severe nausea/vomiting, inability to pass bowels or gas, severe abdominal pain/tenderness, jaundice.   Begin Zepbound  2.5 mg weekly injection.  Patient education was given.  Autoinjector was reviewed with patient.  1 sample pack was given to patient today at the visit.  Morbid obesity  -ongoing struggles with weight loss.  Current BMI 49. Her BMI is over 30, qualifying her for GLP-1 agonist (Zepbound ) for weight loss and sleep apnea management. Discussed Zepbound 's role in weight loss and potential side effects, including GI symptoms and constipation. Emphasized diet and hydration to mitigate side effects.  Patient would like to initiate Zepbound  2.5 mg weekly injection after the deductible is met. Provided education on diet, hydration, and exercise. Will monitor for GI side effects and constipation. A virtual follow-up is scheduled  in 3-4 weeks to assess tolerance and adjust the dose.  History of pulmonary embolism   She is currently on Eliquis . . Continue Eliquis  as prescribed and avoid NSAIDs.  Rheumatoid arthritis continue on current maintenance regimen and follow-up with rheumatology  Asthma and allergies appears to be stable.  Continue follow-up with her allergist.         Madelin Stank, NP 05/29/2024  I spent 45   minutes  dedicated to the care of this patient on the date of this encounter to include pre-visit review of records, face-to-face time with the patient discussing conditions above, post visit ordering of testing, clinical documentation with the electronic health record, making appropriate referrals as documented, and communicating necessary findings to members of the patients care team.      [1]  Allergies Allergen Reactions   Other Nausea And Vomiting, Nausea Only and Other (See Comments)    Darvocet N-100  Other reaction(s): Vomiting   Propoxyphene Nausea And Vomiting and Nausea Only  [2]  Social History Tobacco Use  Smoking Status Never  Smokeless Tobacco Never   "

## 2024-06-29 ENCOUNTER — Ambulatory Visit: Admitting: Adult Health

## 2024-10-29 ENCOUNTER — Ambulatory Visit: Admitting: Allergy

## 2024-11-08 ENCOUNTER — Ambulatory Visit (HOSPITAL_BASED_OUTPATIENT_CLINIC_OR_DEPARTMENT_OTHER): Admitting: Certified Nurse Midwife

## 2025-03-11 ENCOUNTER — Encounter: Admitting: Physical Medicine and Rehabilitation
